# Patient Record
Sex: Male | Born: 1935
Health system: Southern US, Community
[De-identification: ages and names within clinical notes are randomized; demographics above are authoritative.]

## PROBLEM LIST (undated history)

## (undated) DIAGNOSIS — N259 Disorder resulting from impaired renal tubular function, unspecified: Secondary | ICD-10-CM

## (undated) DIAGNOSIS — C649 Malignant neoplasm of unspecified kidney, except renal pelvis: Secondary | ICD-10-CM

## (undated) DIAGNOSIS — J441 Chronic obstructive pulmonary disease with (acute) exacerbation: Secondary | ICD-10-CM

## (undated) DIAGNOSIS — K219 Gastro-esophageal reflux disease without esophagitis: Secondary | ICD-10-CM

## (undated) DIAGNOSIS — H1045 Other chronic allergic conjunctivitis: Secondary | ICD-10-CM

## (undated) DIAGNOSIS — Z8711 Personal history of peptic ulcer disease: Secondary | ICD-10-CM

## (undated) DIAGNOSIS — J309 Allergic rhinitis, unspecified: Secondary | ICD-10-CM

## (undated) DIAGNOSIS — I639 Cerebral infarction, unspecified: Secondary | ICD-10-CM

## (undated) DIAGNOSIS — J449 Chronic obstructive pulmonary disease, unspecified: Secondary | ICD-10-CM

## (undated) DIAGNOSIS — Z87442 Personal history of urinary calculi: Secondary | ICD-10-CM

## (undated) DIAGNOSIS — I10 Essential (primary) hypertension: Secondary | ICD-10-CM

## (undated) DIAGNOSIS — L989 Disorder of the skin and subcutaneous tissue, unspecified: Secondary | ICD-10-CM

## (undated) DIAGNOSIS — E669 Obesity, unspecified: Secondary | ICD-10-CM

## (undated) DIAGNOSIS — E785 Hyperlipidemia, unspecified: Secondary | ICD-10-CM

## (undated) DIAGNOSIS — M25539 Pain in unspecified wrist: Secondary | ICD-10-CM

## (undated) DIAGNOSIS — M1990 Unspecified osteoarthritis, unspecified site: Secondary | ICD-10-CM

## (undated) DIAGNOSIS — N400 Enlarged prostate without lower urinary tract symptoms: Secondary | ICD-10-CM

## (undated) DIAGNOSIS — M549 Dorsalgia, unspecified: Secondary | ICD-10-CM

## (undated) DIAGNOSIS — R42 Dizziness and giddiness: Secondary | ICD-10-CM

## (undated) DIAGNOSIS — A419 Sepsis, unspecified organism: Secondary | ICD-10-CM

## (undated) DIAGNOSIS — M722 Plantar fascial fibromatosis: Secondary | ICD-10-CM

## (undated) DIAGNOSIS — Z8679 Personal history of other diseases of the circulatory system: Secondary | ICD-10-CM

## (undated) DIAGNOSIS — R011 Cardiac murmur, unspecified: Secondary | ICD-10-CM

## (undated) DIAGNOSIS — M109 Gout, unspecified: Secondary | ICD-10-CM

## (undated) DIAGNOSIS — R079 Chest pain, unspecified: Secondary | ICD-10-CM

## (undated) DIAGNOSIS — K5900 Constipation, unspecified: Secondary | ICD-10-CM

## (undated) DIAGNOSIS — I739 Peripheral vascular disease, unspecified: Secondary | ICD-10-CM

## (undated) DIAGNOSIS — Z8719 Personal history of other diseases of the digestive system: Secondary | ICD-10-CM

## (undated) DIAGNOSIS — I350 Nonrheumatic aortic (valve) stenosis: Secondary | ICD-10-CM

## (undated) DIAGNOSIS — K279 Peptic ulcer, site unspecified, unspecified as acute or chronic, without hemorrhage or perforation: Secondary | ICD-10-CM

## (undated) HISTORY — DX: Malignant neoplasm of unspecified kidney, except renal pelvis: C64.9

## (undated) HISTORY — DX: Gastro-esophageal reflux disease without esophagitis: K21.9

## (undated) HISTORY — DX: Plantar fascial fibromatosis: M72.2

## (undated) HISTORY — DX: Dorsalgia, unspecified: M54.9

## (undated) HISTORY — DX: Dizziness and giddiness: R42

## (undated) HISTORY — PX: JOINT REPLACEMENT: SHX530

## (undated) HISTORY — DX: Pain in unspecified wrist: M25.539

## (undated) HISTORY — DX: Obesity, unspecified: E66.9

## (undated) HISTORY — DX: Hyperlipidemia, unspecified: E78.5

## (undated) HISTORY — DX: Disorder resulting from impaired renal tubular function, unspecified: N25.9

## (undated) HISTORY — DX: Peripheral vascular disease, unspecified: I73.9

## (undated) HISTORY — DX: Peptic ulcer, site unspecified, unspecified as acute or chronic, without hemorrhage or perforation: K27.9

## (undated) HISTORY — DX: Essential (primary) hypertension: I10

## (undated) HISTORY — PX: CATARACT EXTRACTION W/ INTRAOCULAR LENS  IMPLANT, BILATERAL: SHX1307

## (undated) HISTORY — DX: Nonrheumatic aortic (valve) stenosis: I35.0

## (undated) HISTORY — PX: NEPHRECTOMY: SHX65

## (undated) HISTORY — DX: Chronic obstructive pulmonary disease, unspecified: J44.9

## (undated) HISTORY — DX: Other chronic allergic conjunctivitis: H10.45

## (undated) HISTORY — DX: Gout, unspecified: M10.9

## (undated) HISTORY — PX: INNER EAR SURGERY: SHX679

## (undated) HISTORY — PX: FEMORAL-POPLITEAL BYPASS GRAFT: SHX937

## (undated) HISTORY — DX: Allergic rhinitis, unspecified: J30.9

## (undated) HISTORY — DX: Chest pain, unspecified: R07.9

## (undated) HISTORY — DX: Personal history of urinary calculi: Z87.442

## (undated) HISTORY — DX: Benign prostatic hyperplasia without lower urinary tract symptoms: N40.0

## (undated) HISTORY — DX: Constipation, unspecified: K59.00

## (undated) HISTORY — DX: Disorder of the skin and subcutaneous tissue, unspecified: L98.9

## (undated) HISTORY — DX: Chronic obstructive pulmonary disease with (acute) exacerbation: J44.1

## (undated) HISTORY — DX: Personal history of other diseases of the circulatory system: Z86.79

## (undated) HISTORY — PX: NEPHROURETERECTOMY: SUR876

---

## 1998-01-04 ENCOUNTER — Ambulatory Visit (HOSPITAL_COMMUNITY): Admission: RE | Admit: 1998-01-04 | Discharge: 1998-01-04 | Payer: Self-pay | Admitting: Cardiology

## 1998-09-25 ENCOUNTER — Encounter: Payer: Self-pay | Admitting: Vascular Surgery

## 1998-09-26 ENCOUNTER — Ambulatory Visit: Admission: RE | Admit: 1998-09-26 | Discharge: 1998-09-26 | Payer: Self-pay | Admitting: Vascular Surgery

## 1998-10-04 ENCOUNTER — Inpatient Hospital Stay: Admission: RE | Admit: 1998-10-04 | Discharge: 1998-10-07 | Payer: Self-pay | Admitting: Vascular Surgery

## 1999-04-09 ENCOUNTER — Emergency Department (HOSPITAL_COMMUNITY): Admission: EM | Admit: 1999-04-09 | Discharge: 1999-04-09 | Payer: Self-pay | Admitting: Emergency Medicine

## 1999-04-09 ENCOUNTER — Encounter: Payer: Self-pay | Admitting: Emergency Medicine

## 1999-04-23 ENCOUNTER — Ambulatory Visit (HOSPITAL_COMMUNITY): Admission: RE | Admit: 1999-04-23 | Discharge: 1999-04-23 | Payer: Self-pay | Admitting: Internal Medicine

## 1999-04-23 ENCOUNTER — Encounter: Payer: Self-pay | Admitting: Internal Medicine

## 2001-08-18 ENCOUNTER — Ambulatory Visit (HOSPITAL_COMMUNITY): Admission: RE | Admit: 2001-08-18 | Discharge: 2001-08-18 | Payer: Self-pay | Admitting: Gastroenterology

## 2006-07-07 ENCOUNTER — Ambulatory Visit (HOSPITAL_COMMUNITY): Admission: RE | Admit: 2006-07-07 | Discharge: 2006-07-07 | Payer: Self-pay | Admitting: Urology

## 2006-07-30 ENCOUNTER — Ambulatory Visit: Payer: Self-pay | Admitting: Internal Medicine

## 2006-08-20 ENCOUNTER — Ambulatory Visit: Payer: Self-pay | Admitting: Internal Medicine

## 2006-08-25 ENCOUNTER — Ambulatory Visit: Payer: Self-pay | Admitting: Gastroenterology

## 2006-09-09 ENCOUNTER — Ambulatory Visit: Payer: Self-pay | Admitting: Gastroenterology

## 2006-11-11 ENCOUNTER — Ambulatory Visit: Payer: Self-pay | Admitting: Internal Medicine

## 2007-02-11 ENCOUNTER — Ambulatory Visit: Payer: Self-pay | Admitting: Internal Medicine

## 2007-02-11 LAB — CONVERTED CEMR LAB
ALT: 15 units/L (ref 0–40)
Albumin: 3.5 g/dL (ref 3.5–5.2)
Alkaline Phosphatase: 73 units/L (ref 39–117)
CO2: 31 meq/L (ref 19–32)
Calcium: 8.9 mg/dL (ref 8.4–10.5)
Glucose, Bld: 103 mg/dL — ABNORMAL HIGH (ref 70–99)
Potassium: 3.9 meq/L (ref 3.5–5.1)
Sodium: 142 meq/L (ref 135–145)
Total Bilirubin: 0.8 mg/dL (ref 0.3–1.2)
Total CHOL/HDL Ratio: 4

## 2007-02-18 ENCOUNTER — Ambulatory Visit: Payer: Self-pay | Admitting: Internal Medicine

## 2007-05-12 DIAGNOSIS — I10 Essential (primary) hypertension: Secondary | ICD-10-CM

## 2007-05-12 DIAGNOSIS — K279 Peptic ulcer, site unspecified, unspecified as acute or chronic, without hemorrhage or perforation: Secondary | ICD-10-CM | POA: Insufficient documentation

## 2007-05-12 DIAGNOSIS — C649 Malignant neoplasm of unspecified kidney, except renal pelvis: Secondary | ICD-10-CM

## 2007-05-12 DIAGNOSIS — J449 Chronic obstructive pulmonary disease, unspecified: Secondary | ICD-10-CM | POA: Insufficient documentation

## 2007-05-12 DIAGNOSIS — N400 Enlarged prostate without lower urinary tract symptoms: Secondary | ICD-10-CM

## 2007-05-12 DIAGNOSIS — E785 Hyperlipidemia, unspecified: Secondary | ICD-10-CM

## 2007-05-12 DIAGNOSIS — I739 Peripheral vascular disease, unspecified: Secondary | ICD-10-CM

## 2007-05-12 DIAGNOSIS — J4489 Other specified chronic obstructive pulmonary disease: Secondary | ICD-10-CM

## 2007-05-12 DIAGNOSIS — Z8679 Personal history of other diseases of the circulatory system: Secondary | ICD-10-CM | POA: Insufficient documentation

## 2007-05-12 DIAGNOSIS — E669 Obesity, unspecified: Secondary | ICD-10-CM

## 2007-05-12 HISTORY — DX: Malignant neoplasm of unspecified kidney, except renal pelvis: C64.9

## 2007-05-12 HISTORY — DX: Obesity, unspecified: E66.9

## 2007-05-12 HISTORY — DX: Personal history of other diseases of the circulatory system: Z86.79

## 2007-05-12 HISTORY — DX: Other specified chronic obstructive pulmonary disease: J44.89

## 2007-05-12 HISTORY — DX: Essential (primary) hypertension: I10

## 2007-05-12 HISTORY — DX: Benign prostatic hyperplasia without lower urinary tract symptoms: N40.0

## 2007-05-12 HISTORY — DX: Hyperlipidemia, unspecified: E78.5

## 2007-05-12 HISTORY — DX: Peripheral vascular disease, unspecified: I73.9

## 2007-05-12 HISTORY — DX: Peptic ulcer, site unspecified, unspecified as acute or chronic, without hemorrhage or perforation: K27.9

## 2007-05-12 HISTORY — DX: Chronic obstructive pulmonary disease, unspecified: J44.9

## 2007-05-16 ENCOUNTER — Ambulatory Visit: Payer: Self-pay | Admitting: Internal Medicine

## 2007-05-23 ENCOUNTER — Ambulatory Visit: Payer: Self-pay | Admitting: Vascular Surgery

## 2007-06-01 HISTORY — PX: TOTAL KNEE ARTHROPLASTY: SHX125

## 2007-06-13 ENCOUNTER — Encounter: Payer: Self-pay | Admitting: Internal Medicine

## 2007-06-13 ENCOUNTER — Inpatient Hospital Stay (HOSPITAL_COMMUNITY): Admission: RE | Admit: 2007-06-13 | Discharge: 2007-06-16 | Payer: Self-pay | Admitting: Orthopedic Surgery

## 2007-07-19 ENCOUNTER — Encounter: Payer: Self-pay | Admitting: Internal Medicine

## 2007-08-10 ENCOUNTER — Ambulatory Visit: Payer: Self-pay | Admitting: Internal Medicine

## 2007-08-10 LAB — CONVERTED CEMR LAB
AST: 17 units/L (ref 0–37)
Albumin: 3.4 g/dL — ABNORMAL LOW (ref 3.5–5.2)
Alkaline Phosphatase: 90 units/L (ref 39–117)
BUN: 16 mg/dL (ref 6–23)
Basophils Absolute: 0 10*3/uL (ref 0.0–0.1)
Bilirubin Urine: NEGATIVE
Chloride: 102 meq/L (ref 96–112)
Creatinine, Ser: 1.8 mg/dL — ABNORMAL HIGH (ref 0.4–1.5)
HDL: 33.9 mg/dL — ABNORMAL LOW (ref >39.0)
Hemoglobin, Urine: NEGATIVE
Ketones, ur: NEGATIVE mg/dL
LDL Cholesterol: 71 mg/dL (ref 0–99)
MCHC: 34.7 g/dL (ref 30.0–36.0)
Monocytes Relative: 11.4 % — ABNORMAL HIGH (ref 3.0–11.0)
RBC: 4.51 M/uL (ref 4.22–5.81)
RDW: 12.7 % (ref 11.5–14.6)
TSH: 1.82 microintl units/mL (ref 0.35–5.50)
Total Bilirubin: 0.6 mg/dL (ref 0.3–1.2)
Triglycerides: 121 mg/dL (ref 0–149)
Urobilinogen, UA: 0.2 (ref 0.0–1.0)
VLDL: 24 mg/dL (ref 0–40)
pH: 7 (ref 5.0–8.0)

## 2007-08-17 ENCOUNTER — Ambulatory Visit: Payer: Self-pay | Admitting: Internal Medicine

## 2007-08-17 DIAGNOSIS — N183 Chronic kidney disease, stage 3 (moderate): Secondary | ICD-10-CM

## 2007-08-17 DIAGNOSIS — Z87442 Personal history of urinary calculi: Secondary | ICD-10-CM

## 2007-08-17 DIAGNOSIS — N259 Disorder resulting from impaired renal tubular function, unspecified: Secondary | ICD-10-CM

## 2007-08-17 DIAGNOSIS — J441 Chronic obstructive pulmonary disease with (acute) exacerbation: Secondary | ICD-10-CM

## 2007-08-17 HISTORY — DX: Personal history of urinary calculi: Z87.442

## 2007-08-17 HISTORY — DX: Chronic obstructive pulmonary disease with (acute) exacerbation: J44.1

## 2007-08-17 HISTORY — DX: Disorder resulting from impaired renal tubular function, unspecified: N25.9

## 2007-09-29 ENCOUNTER — Ambulatory Visit: Payer: Self-pay | Admitting: Internal Medicine

## 2007-09-29 DIAGNOSIS — J309 Allergic rhinitis, unspecified: Secondary | ICD-10-CM | POA: Insufficient documentation

## 2007-09-29 DIAGNOSIS — R42 Dizziness and giddiness: Secondary | ICD-10-CM

## 2007-09-29 HISTORY — DX: Allergic rhinitis, unspecified: J30.9

## 2007-09-29 HISTORY — DX: Dizziness and giddiness: R42

## 2007-10-12 ENCOUNTER — Encounter: Payer: Self-pay | Admitting: Internal Medicine

## 2007-10-18 ENCOUNTER — Encounter: Payer: Self-pay | Admitting: Internal Medicine

## 2007-10-24 ENCOUNTER — Inpatient Hospital Stay (HOSPITAL_COMMUNITY): Admission: RE | Admit: 2007-10-24 | Discharge: 2007-10-28 | Payer: Self-pay | Admitting: Orthopedic Surgery

## 2007-12-06 ENCOUNTER — Encounter: Payer: Self-pay | Admitting: Internal Medicine

## 2008-01-31 ENCOUNTER — Ambulatory Visit: Payer: Self-pay | Admitting: Internal Medicine

## 2008-01-31 DIAGNOSIS — R079 Chest pain, unspecified: Secondary | ICD-10-CM | POA: Insufficient documentation

## 2008-01-31 HISTORY — DX: Chest pain, unspecified: R07.9

## 2008-02-09 ENCOUNTER — Encounter: Payer: Self-pay | Admitting: Internal Medicine

## 2008-03-14 ENCOUNTER — Encounter: Payer: Self-pay | Admitting: Internal Medicine

## 2008-03-21 ENCOUNTER — Ambulatory Visit: Payer: Self-pay | Admitting: Internal Medicine

## 2008-03-21 LAB — CONVERTED CEMR LAB
Alkaline Phosphatase: 77 units/L (ref 39–117)
Basophils Absolute: 0 10*3/uL (ref 0.0–0.1)
Bilirubin Urine: NEGATIVE
Bilirubin, Direct: 0.1 mg/dL (ref 0.0–0.3)
Eosinophils Absolute: 0.3 10*3/uL (ref 0.0–0.7)
GFR calc Af Amer: 47 mL/min
GFR calc non Af Amer: 39 mL/min
HCT: 41.9 % (ref 39.0–52.0)
HDL: 40.5 mg/dL (ref >39.0)
Hemoglobin, Urine: NEGATIVE
MCHC: 35 g/dL (ref 30.0–36.0)
MCV: 92.8 fL (ref 78.0–100.0)
Monocytes Absolute: 1.1 10*3/uL — ABNORMAL HIGH (ref 0.1–1.0)
Monocytes Relative: 11.3 % (ref 3.0–12.0)
Nitrite: NEGATIVE
Platelets: 326 10*3/uL (ref 150–400)
Potassium: 4.3 meq/L (ref 3.5–5.1)
RDW: 12.7 % (ref 11.5–14.6)
Sodium: 139 meq/L (ref 135–145)
TSH: 1.42 microintl units/mL (ref 0.35–5.50)
Total Bilirubin: 0.8 mg/dL (ref 0.3–1.2)
Total CHOL/HDL Ratio: 3.3
Total Protein, Urine: NEGATIVE mg/dL
VLDL: 17 mg/dL (ref 0–40)

## 2008-03-22 ENCOUNTER — Encounter: Payer: Self-pay | Admitting: Internal Medicine

## 2008-03-27 ENCOUNTER — Encounter: Payer: Self-pay | Admitting: Internal Medicine

## 2008-03-28 ENCOUNTER — Ambulatory Visit: Payer: Self-pay | Admitting: Internal Medicine

## 2008-04-03 ENCOUNTER — Encounter: Payer: Self-pay | Admitting: Internal Medicine

## 2008-09-19 ENCOUNTER — Ambulatory Visit: Payer: Self-pay | Admitting: Internal Medicine

## 2008-09-19 LAB — CONVERTED CEMR LAB
ALT: 15 units/L (ref 0–53)
Albumin: 3.4 g/dL — ABNORMAL LOW (ref 3.5–5.2)
Cholesterol: 126 mg/dL (ref 0–200)
LDL Cholesterol: 72 mg/dL (ref 0–99)
Total Protein: 7 g/dL (ref 6.0–8.3)
Triglycerides: 78 mg/dL (ref 0–149)
VLDL: 16 mg/dL (ref 0–40)

## 2008-09-25 ENCOUNTER — Encounter: Payer: Self-pay | Admitting: Internal Medicine

## 2008-09-27 ENCOUNTER — Ambulatory Visit: Payer: Self-pay | Admitting: Internal Medicine

## 2008-09-27 DIAGNOSIS — M722 Plantar fascial fibromatosis: Secondary | ICD-10-CM

## 2008-09-27 HISTORY — DX: Plantar fascial fibromatosis: M72.2

## 2009-01-30 ENCOUNTER — Telehealth: Payer: Self-pay | Admitting: Internal Medicine

## 2009-01-31 ENCOUNTER — Ambulatory Visit: Payer: Self-pay | Admitting: Internal Medicine

## 2009-01-31 DIAGNOSIS — M549 Dorsalgia, unspecified: Secondary | ICD-10-CM | POA: Insufficient documentation

## 2009-01-31 HISTORY — DX: Dorsalgia, unspecified: M54.9

## 2009-02-26 ENCOUNTER — Ambulatory Visit: Payer: Self-pay | Admitting: Internal Medicine

## 2009-02-26 DIAGNOSIS — M25539 Pain in unspecified wrist: Secondary | ICD-10-CM

## 2009-02-26 HISTORY — DX: Pain in unspecified wrist: M25.539

## 2009-02-26 LAB — CONVERTED CEMR LAB
Basophils Relative: 0.5 % (ref 0.0–3.0)
Eosinophils Absolute: 0.1 10*3/uL (ref 0.0–0.7)
Eosinophils Relative: 0.7 % (ref 0.0–5.0)
Lymphocytes Relative: 25.1 % (ref 12.0–46.0)
MCHC: 33.9 g/dL (ref 30.0–36.0)
Monocytes Absolute: 0.8 10*3/uL (ref 0.1–1.0)
Neutrophils Relative %: 65.5 % (ref 43.0–77.0)
Platelets: 374 10*3/uL (ref 150.0–400.0)
RBC: 4.77 M/uL (ref 4.22–5.81)
Sed Rate: 89 mm/hr — ABNORMAL HIGH (ref 0–22)
WBC: 10.1 10*3/uL (ref 4.5–10.5)

## 2009-03-21 ENCOUNTER — Ambulatory Visit: Payer: Self-pay | Admitting: Internal Medicine

## 2009-03-21 LAB — CONVERTED CEMR LAB
AST: 15 units/L (ref 0–37)
Albumin: 3.4 g/dL — ABNORMAL LOW (ref 3.5–5.2)
Alkaline Phosphatase: 80 units/L (ref 39–117)
Calcium: 9.2 mg/dL (ref 8.4–10.5)
Cholesterol: 131 mg/dL (ref 0–200)
Eosinophils Relative: 4 % (ref 0.0–5.0)
GFR calc non Af Amer: 38.55 mL/min (ref >60)
Glucose, Bld: 102 mg/dL — ABNORMAL HIGH (ref 70–99)
HCT: 45.2 % (ref 39.0–52.0)
HDL: 38.4 mg/dL — ABNORMAL LOW (ref >39.00)
Hemoglobin, Urine: NEGATIVE
Ketones, ur: NEGATIVE mg/dL
Lymphs Abs: 3 10*3/uL (ref 0.7–4.0)
MCHC: 33.9 g/dL (ref 30.0–36.0)
MCV: 94.8 fL (ref 78.0–100.0)
Neutrophils Relative %: 55.2 % (ref 43.0–77.0)
Platelets: 274 10*3/uL (ref 150.0–400.0)
Potassium: 4.7 meq/L (ref 3.5–5.1)
RDW: 13 % (ref 11.5–14.6)
Sodium: 144 meq/L (ref 135–145)
Specific Gravity, Urine: 1.01 (ref 1.000–1.030)
Total Protein: 6.7 g/dL (ref 6.0–8.3)
Triglycerides: 101 mg/dL (ref 0.0–149.0)
Urine Glucose: NEGATIVE mg/dL
Urobilinogen, UA: 0.2 (ref 0.0–1.0)
VLDL: 20.2 mg/dL (ref 0.0–40.0)
WBC: 9.8 10*3/uL (ref 4.5–10.5)

## 2009-03-28 ENCOUNTER — Ambulatory Visit: Payer: Self-pay | Admitting: Internal Medicine

## 2009-04-08 ENCOUNTER — Telehealth: Payer: Self-pay | Admitting: Internal Medicine

## 2009-06-11 ENCOUNTER — Telehealth: Payer: Self-pay | Admitting: Internal Medicine

## 2009-09-23 ENCOUNTER — Ambulatory Visit: Payer: Self-pay | Admitting: Internal Medicine

## 2009-09-25 ENCOUNTER — Ambulatory Visit: Payer: Self-pay | Admitting: Internal Medicine

## 2009-09-27 ENCOUNTER — Encounter: Payer: Self-pay | Admitting: Internal Medicine

## 2009-09-27 ENCOUNTER — Encounter: Admission: RE | Admit: 2009-09-27 | Discharge: 2009-09-27 | Payer: Self-pay | Admitting: Internal Medicine

## 2009-10-07 ENCOUNTER — Telehealth: Payer: Self-pay | Admitting: Internal Medicine

## 2009-10-07 ENCOUNTER — Ambulatory Visit: Payer: Self-pay | Admitting: Internal Medicine

## 2009-10-24 ENCOUNTER — Telehealth (INDEPENDENT_AMBULATORY_CARE_PROVIDER_SITE_OTHER): Payer: Self-pay | Admitting: *Deleted

## 2009-11-12 ENCOUNTER — Encounter: Payer: Self-pay | Admitting: Internal Medicine

## 2010-02-19 ENCOUNTER — Ambulatory Visit: Payer: Self-pay | Admitting: Internal Medicine

## 2010-02-19 DIAGNOSIS — K5900 Constipation, unspecified: Secondary | ICD-10-CM

## 2010-02-19 HISTORY — DX: Constipation, unspecified: K59.00

## 2010-02-23 DIAGNOSIS — K219 Gastro-esophageal reflux disease without esophagitis: Secondary | ICD-10-CM

## 2010-02-23 HISTORY — DX: Gastro-esophageal reflux disease without esophagitis: K21.9

## 2010-03-18 ENCOUNTER — Ambulatory Visit: Payer: Self-pay | Admitting: Internal Medicine

## 2010-03-18 LAB — CONVERTED CEMR LAB
ALT: 15 units/L (ref 0–53)
AST: 15 units/L (ref 0–37)
Alkaline Phosphatase: 72 units/L (ref 39–117)
Basophils Absolute: 0 10*3/uL (ref 0.0–0.1)
Bilirubin, Direct: 0.2 mg/dL (ref 0.0–0.3)
CO2: 31 meq/L (ref 19–32)
Calcium: 8.8 mg/dL (ref 8.4–10.5)
Chloride: 105 meq/L (ref 96–112)
Eosinophils Relative: 4.4 % (ref 0.0–5.0)
Glucose, Bld: 91 mg/dL (ref 70–99)
HCT: 43.7 % (ref 39.0–52.0)
HDL: 38 mg/dL — ABNORMAL LOW (ref >39.00)
Ketones, ur: NEGATIVE mg/dL
LDL Cholesterol: 86 mg/dL (ref 0–99)
Lymphocytes Relative: 37.9 % (ref 12.0–46.0)
Lymphs Abs: 3.2 10*3/uL (ref 0.7–4.0)
Monocytes Relative: 10.6 % (ref 3.0–12.0)
Neutrophils Relative %: 46.7 % (ref 43.0–77.0)
Platelets: 297 10*3/uL (ref 150.0–400.0)
Sodium: 140 meq/L (ref 135–145)
Specific Gravity, Urine: 1.01 (ref 1.000–1.030)
Total Bilirubin: 0.5 mg/dL (ref 0.3–1.2)
Total CHOL/HDL Ratio: 4
Urine Glucose: NEGATIVE mg/dL
WBC: 8.4 10*3/uL (ref 4.5–10.5)
pH: 7.5 (ref 5.0–8.0)

## 2010-03-24 ENCOUNTER — Ambulatory Visit: Payer: Self-pay | Admitting: Internal Medicine

## 2010-03-24 DIAGNOSIS — H1045 Other chronic allergic conjunctivitis: Secondary | ICD-10-CM

## 2010-03-24 HISTORY — DX: Other chronic allergic conjunctivitis: H10.45

## 2010-04-14 ENCOUNTER — Telehealth: Payer: Self-pay | Admitting: Internal Medicine

## 2010-04-22 ENCOUNTER — Telehealth: Payer: Self-pay | Admitting: Internal Medicine

## 2010-06-13 ENCOUNTER — Encounter (INDEPENDENT_AMBULATORY_CARE_PROVIDER_SITE_OTHER): Payer: Self-pay | Admitting: *Deleted

## 2010-06-13 ENCOUNTER — Telehealth: Payer: Self-pay | Admitting: Internal Medicine

## 2010-07-04 ENCOUNTER — Ambulatory Visit: Payer: Self-pay | Admitting: Internal Medicine

## 2010-07-04 DIAGNOSIS — M109 Gout, unspecified: Secondary | ICD-10-CM | POA: Insufficient documentation

## 2010-07-04 HISTORY — DX: Gout, unspecified: M10.9

## 2010-07-04 LAB — CONVERTED CEMR LAB
CO2: 31 meq/L (ref 19–32)
GFR calc non Af Amer: 35.67 mL/min (ref >60)
Glucose, Bld: 93 mg/dL (ref 70–99)
Potassium: 4.5 meq/L (ref 3.5–5.1)
Sodium: 140 meq/L (ref 135–145)
Uric Acid, Serum: 9.8 mg/dL — ABNORMAL HIGH (ref 4.0–7.8)

## 2010-07-07 ENCOUNTER — Telehealth: Payer: Self-pay | Admitting: Internal Medicine

## 2010-07-07 ENCOUNTER — Encounter: Payer: Self-pay | Admitting: Internal Medicine

## 2010-09-02 ENCOUNTER — Ambulatory Visit
Admission: RE | Admit: 2010-09-02 | Discharge: 2010-09-02 | Payer: Self-pay | Source: Home / Self Care | Attending: Internal Medicine | Admitting: Internal Medicine

## 2010-10-02 NOTE — Assessment & Plan Note (Signed)
Summary: 6 MTH FU--STC   Vital Signs:  Patient profile:   77 year old male Height:      65 inches Weight:      183.75 pounds BMI:     30.69 O2 Sat:      95 % on Room air Temp:     98.2 degrees F oral Pulse rate:   65 / minute BP sitting:   148 / 72  (left arm) Cuff size:   regular  Vitals Entered By: Zella Ball Ewing CMA Duncan Dull) (March 24, 2010 11:03 AM)  O2 Flow:  Room air  CC: 6 month ROV/RE   Primary Care Provider:  Corwin Levins MD  CC:  6 month ROV/RE.  History of Present Illness: here to f/u; back pain improved with prednisone;  Pt denies CP, sob, doe, wheezing, orthopnea, pnd, worsening LE edema, palps, dizziness or syncope  Pt denies new neuro symptoms such as headache, facial or extremity weakness  C/o right and occasinally left eye matting in the AM without pain or swellking or redness;    Problems Prior to Update: 1)  Conjunctivitis, Allergic, Chronic  (ICD-372.14) 2)  Constipation  (ICD-564.00) 3)  Gerd  (ICD-530.81) 4)  Wrist Pain, Right  (ICD-719.43) 5)  Back Pain  (ICD-724.5) 6)  Plantar Fasciitis, Right  (ICD-728.71) 7)  Preventive Health Care  (ICD-V70.0) 8)  Chest Pain  (ICD-786.50) 9)  Allergic Rhinitis  (ICD-477.9) 10)  Vertigo  (ICD-780.4) 11)  Nephrolithiasis, Hx of  (ICD-V13.01) 12)  Renal Insufficiency  (ICD-588.9) 13)  Chronic Obstructive Pulmonary Disease, Acute Exacerbation  (ICD-491.21) 14)  Preventive Health Care  (ICD-V70.0) 15)  Special Screening Malignant Neoplasm of Prostate  (ICD-V76.44) 16)  Routine General Medical Exam@health  Care Facl  (ICD-V70.0) 17)  Renal Cell Cancer  (ICD-189.0) 18)  Transient Ischemic Attack, Hx of  (ICD-V12.50) 19)  COPD  (ICD-496) 20)  Obesity  (ICD-278.00) 21)  Cerebrovascular Accident, Hx of  (ICD-V12.50) 22)  Benign Prostatic Hypertrophy  (ICD-600.00) 23)  Peripheral Vascular Disease  (ICD-443.9) 24)  Peptic Ulcer Disease  (ICD-533.90) 25)  Hypertension  (ICD-401.9) 26)  Hyperlipidemia   (ICD-272.4)  Medications Prior to Update: 1)  Hydrochlorothiazide 25 Mg  Tabs (Hydrochlorothiazide) .... Take 1 Tab By Mouth Every Morning 2)  Amlodipine Besylate 5 Mg  Tabs (Amlodipine Besylate) .Marland Kitchen.. 1 By Mouth Qd 3)  Pravastatin Sodium 40 Mg  Tabs (Pravastatin Sodium) .Marland Kitchen.. 1 By Mouth Once Daily 4)  Omeprazole 20 Mg Cpdr (Omeprazole) .... By Mouth Once Daily 5)  Ecotrin 325 Mg  Tbec (Aspirin) .Marland Kitchen.. 1 By Mouth Qd 6)  Oscal 500/200 D-3 500-200 Mg-Unit  Tabs (Calcium-Vitamin D) .Marland Kitchen.. 1 By Mouth Once Daily 7)  Meclizine Hcl 12.5 Mg  Tabs (Meclizine Hcl) .Marland Kitchen.. 1 By Mouth Qid Prn 8)  Cetirizine Hcl 10 Mg  Tabs (Cetirizine Hcl) .Marland Kitchen.. 1 By Mouth Once Daily Prn 9)  Spiriva Handihaler 18 Mcg Caps (Tiotropium Bromide Monohydrate) .... Use Asd 1 Once Daily 10)  Flexeril 5 Mg Tabs (Cyclobenzaprine Hcl) .Marland Kitchen.. 1 By Mouth Three Times A Day As Needed 11)  Prednisone 10 Mg Tabs (Prednisone) .... 3po Qd For 3days, Then 2po Qd For 3days, Then 1po Qd For 3days, Then Stop 12)  Tylenol With Codeine #3 300-30 Mg Tabs (Acetaminophen-Codeine) .Marland Kitchen.. 1po Q 6 Hrs As Needed Pain 13)  Promethazine Hcl 25 Mg Tabs (Promethazine Hcl) .Marland Kitchen.. 1po Q 6 Hrs As Needed Nausea 14)  Miralax  Powd (Polyethylene Glycol 3350) .Marland KitchenMarland KitchenMarland Kitchen 17 Gm in Q [employer]  Per Day  Current Medications (verified): 1)  Hydrochlorothiazide 25 Mg  Tabs (Hydrochlorothiazide) .... Take 1 Tab By Mouth Every Morning 2)  Amlodipine Besylate 5 Mg  Tabs (Amlodipine Besylate) .Marland Kitchen.. 1 By Mouth Qd 3)  Pravastatin Sodium 40 Mg  Tabs (Pravastatin Sodium) .Marland Kitchen.. 1 By Mouth Once Daily 4)  Omeprazole 20 Mg Cpdr (Omeprazole) .... By Mouth Once Daily 5)  Ecotrin 325 Mg  Tbec (Aspirin) .Marland Kitchen.. 1 By Mouth Qd 6)  Oscal 500/200 D-3 500-200 Mg-Unit  Tabs (Calcium-Vitamin D) .Marland Kitchen.. 1 By Mouth Once Daily 7)  Meclizine Hcl 12.5 Mg  Tabs (Meclizine Hcl) .Marland Kitchen.. 1 By Mouth Qid Prn 8)  Cetirizine Hcl 10 Mg  Tabs (Cetirizine Hcl) .Marland Kitchen.. 1 By Mouth Once Daily Prn 9)  Spiriva Handihaler 18 Mcg Caps  (Tiotropium Bromide Monohydrate) .... Use Asd 1 Once Daily 10)  Flexeril 5 Mg Tabs (Cyclobenzaprine Hcl) .Marland Kitchen.. 1 By Mouth Three Times A Day As Needed 11)  Prednisone 10 Mg Tabs (Prednisone) .... 3po Qd For 3days, Then 2po Qd For 3days, Then 1po Qd For 3days, Then Stop 12)  Tylenol With Codeine #3 300-30 Mg Tabs (Acetaminophen-Codeine) .Marland Kitchen.. 1po Q 6 Hrs As Needed Pain 13)  Promethazine Hcl 25 Mg Tabs (Promethazine Hcl) .Marland Kitchen.. 1po Q 6 Hrs As Needed Nausea 14)  Miralax  Powd (Polyethylene Glycol 3350) .Marland KitchenMarland KitchenMarland Kitchen 17 Gm in Q Glass Water Per Day  Allergies (verified): 1)  ! 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  Risk Factors: Smoking Status: current (08/17/2007)  Past Medical History: Hyperlipidemia Hypertension Peptic ulcer disease Peripheral vascular disease Benign prostatic hypertrophy Cerebrovascular accident, hx of Genitourinary Tumor, hx of - renal cell cancer Obesity COPD Transient ischemic attack, hx of cancer, renal cell carcinoma Renal insufficiency - cr 1.8 Nephrolithiasis, hx of Allergic rhinitis right wrist avascular necrosis wrsit bone june 2010 - dr Melvyn Novas  - pt declined surgury GERD  Review of Systems  The patient denies anorexia, fever, weight loss, weight gain, vision loss, hoarseness, chest pain, syncope, dyspnea on exertion, peripheral edema, prolonged cough, headaches, hemoptysis, abdominal pain, melena, hematochezia,  severe indigestion/heartburn, hematuria, muscle weakness, suspicious skin lesions, transient blindness, difficulty walking, depression, unusual weight change, abnormal bleeding, enlarged lymph nodes, and angioedema.         all otherwise negative per pt -  except has ongoing severe bilat hearing loss despite hearing aids  Physical Exam  General:  alert and overweight-appearing.   Head:  normocephalic and atraumatic.   Eyes:  vision grossly intact, pupils equal, and pupils round., conjucnt with mild clearish d/c bilat    Ears:  R ear normal and L ear normal.  Nose:  no external deformity and no nasal discharge.   Mouth:  no gingival abnormalities and pharynx pink and moist.   Neck:  supple and no masses.   Lungs:  normal respiratory effort and normal breath sounds.   Heart:  normal rate and regular rhythm.   Abdomen:  soft, non-tender, and normal bowel sounds.   Msk:  no joint tenderness and  no joint swelling.   Extremities:  no edema, no erythema  Neurologic:  cranial nerves II-XII intact and strength normal in all extremities.     Impression & Recommendations:  Problem # 1:  Preventive Health Care (ICD-V70.0) Overall doing well, age appropriate education and counseling updated and referral for appropriate preventive services done unless declined, immunizations up to date or declined, diet counseling done if overweight, urged to quit smoking if smokes , most recent labs reviewed and current ordered if appropriate, ecg reviewed or declined (interpretation per ECG scanned in the EMR if done); information regarding Medicare Prevention requirements given if appropriate; speciality referrals updated as appropriate   Problem # 2:  CONJUNCTIVITIS, ALLERGIC, CHRONIC (ICD-372.14) for oTC zaditor as needed   Complete Medication List: 1)  Hydrochlorothiazide 25 Mg Tabs (Hydrochlorothiazide) .... Take 1 tab by mouth every morning 2)  Amlodipine Besylate 5 Mg Tabs (Amlodipine besylate) .Marland Kitchen.. 1 by  mouth qd 3)  Pravastatin Sodium 40 Mg Tabs (Pravastatin sodium) .Marland Kitchen.. 1 by mouth once daily 4)  Omeprazole 20 Mg Cpdr (Omeprazole) .... By mouth once daily 5)  Ecotrin 325 Mg Tbec (Aspirin) .Marland Kitchen.. 1 by mouth qd 6)  Oscal 500/200 D-3 500-200 Mg-unit Tabs (Calcium-vitamin d) .Marland Kitchen.. 1 by mouth once daily 7)  Meclizine Hcl 12.5 Mg Tabs (Meclizine hcl) .Marland Kitchen.. 1 by mouth qid prn 8)  Cetirizine Hcl 10 Mg Tabs (Cetirizine hcl) .Marland Kitchen.. 1 by mouth once daily prn 9)  Spiriva Handihaler 18 Mcg Caps (Tiotropium bromide monohydrate) .... Use asd 1 once daily 10)  Flexeril 5 Mg Tabs (Cyclobenzaprine hcl) .Marland Kitchen.. 1 by mouth three times a day as needed 11)  Prednisone 10 Mg Tabs (Prednisone) .... 3po qd for 3days, then 2po qd for 3days, then 1po qd for 3days, then stop 12)  Tylenol With Codeine #3 300-30 Mg Tabs (Acetaminophen-codeine) .Marland Kitchen.. 1po q 6 hrs as needed pain 13)  Promethazine Hcl 25 Mg Tabs (Promethazine hcl) .Marland Kitchen.. 1po q 6 hrs as needed nausea 14)  Miralax Powd (Polyethylene glycol 3350) .Marland KitchenMarland Kitchen. 17 gm in q glass water per day  Patient Instructions: 1)  please try OTC Zaditor eye drops 2)  please make sure to see your eye doctor every year -  consider [facility] 3)  Continue all previous medications as before this visit  4)  Please have the pharmacy call for refills as you need them 5)  Please schedule a follow-up appointment in 6 months or sooner if needed

## 2010-10-02 NOTE — Op Note (Signed)
Summary: [facility]  [facility]   Imported By: Sherian Rein 07/07/2010 08:50:56  _____________________________________________________________________  External Attachment:    Type:   Image     Comment:   External Document

## 2010-10-02 NOTE — Progress Notes (Signed)
  Phone Note Refill Request Message from:  Fax from Pharmacy on April 22, 2010 8:46 AM  Refills Requested: Medication #1:  PRAVASTATIN SODIUM 40 MG  TABS 1 by mouth once daily   Dosage confirmed as above?Dosage Confirmed   Last Refilled: 02/2009   Notes: [facility] Initial call taken by: Robin Ewing CMA Duncan Dull),  April 22, 2010 8:47 AM    Prescriptions: PRAVASTATIN SODIUM 40 MG  TABS (PRAVASTATIN SODIUM) 1 by mouth once daily  #90 x 3   Entered by:   Scharlene Gloss CMA (AAMA)   Authorized by:   Corwin Levins MD   Signed by:   Scharlene Gloss CMA (AAMA) on 04/22/2010   Method used:   Faxed to ...       [facility] (mail-order)       [address]       [place], [place]  [address]       Ph: [phone]       Fax: [phone]   RxID:   [id number]

## 2010-10-02 NOTE — Progress Notes (Signed)
Summary: Surgery?  Phone Note From Other Clinic   Caller: [facility] [phone] Fax Summary of Call: Surgical center called to inform MD that pt is scheduled to have surgery next week on RT wrist. RN is requesting letter from MD stating pt is okay to stop ASA prior to surgery. Initial call taken by: Margaret Pyle, CMA,  June 13, 2010 1:19 PM  Follow-up for Phone Call        ok for lettter - to robin to handle Follow-up by: Corwin Levins MD,  June 13, 2010 2:09 PM  Additional Follow-up for Phone Call Additional follow up Details #1::        Completed letter and faxed to [facility] surgical center. Additional Follow-up by: Robin Ewing CMA Duncan Dull),  June 13, 2010 2:48 PM

## 2010-10-02 NOTE — Assessment & Plan Note (Signed)
Summary: BACK PAIN/ NAUSEA /NWS   Vital Signs:  Patient profile:   77 year old male Height:      65 inches Weight:      176.50 pounds BMI:     29.48 O2 Sat:      95 % on Room air Temp:     96.9 degrees F oral Pulse rate:   70 / minute BP sitting:   126 / 82  (left arm) Cuff size:   regular  Vitals Entered By: Zella Ball Ewing CMA Duncan Dull) (February 19, 2010 10:42 AM)  O2 Flow:  Room air CC: Back pain and nausea for 5 days/RE   Primary Care Provider:  Corwin Levins MD  CC:  Back pain and nausea for 5 days/RE.  History of Present Illness: here with 5 days onset right lower bakc pain;  started one morning  - went to bed the night before and no problem, but trying to get up out of bed had onset severe pain ;  pain is persistent, had some leftover tramadol which has helped; had similar episode one time previously per pt treated here with tramadol, muscle relaxer and prednisone and resolved;  worse stil to get up from sitting but bettter that way, but still hurts much to "move in a certain way" with bending at the waist level;  has some constipation but no worse than usual,  no change in urinary symptoms; no LE pain, weak, or numb,; wife says he "staggers" but he does not think significant and no falls.  No fever, night sweats, wt loss or other constitutional symtpoms. Chart does show 4 lb wt loss.   Has also had some mild to mod nausea  intermittent daily since the day the LBP started,  vomit x 1 wihtout blood 5 days ago;  ? related to tramadol but not clear as he may have some previous.  No dysphagia, take zantac daily.  No BRBPR.  Pt denies CP, sob, doe, wheezing, orthopnea, pnd, worsening LE edema, palps, dizziness or syncope   Problems Prior to Update: 1)  Wrist Pain, Right  (ICD-719.43) 2)  Back Pain  (ICD-724.5) 3)  Plantar Fasciitis, Right  (ICD-728.71) 4)  Preventive Health Care  (ICD-V70.0) 5)  Chest Pain  (ICD-786.50) 6)  Allergic Rhinitis  (ICD-477.9) 7)  Vertigo  (ICD-780.4) 8)   Nephrolithiasis, Hx of  (ICD-V13.01) 9)  Renal Insufficiency  (ICD-588.9) 10)  Chronic Obstructive Pulmonary Disease, Acute Exacerbation  (ICD-491.21) 11)  Preventive Health Care  (ICD-V70.0) 12)  Special Screening Malignant Neoplasm of Prostate  (ICD-V76.44) 13)  Routine General Medical Exam@health  Care Facl  (ICD-V70.0) 14)  Renal Cell Cancer  (ICD-189.0) 15)  Transient Ischemic Attack, Hx of  (ICD-V12.50) 16)  COPD  (ICD-496) 17)  Obesity  (ICD-278.00) 18)  Cerebrovascular Accident, Hx of  (ICD-V12.50) 19)  Benign Prostatic Hypertrophy  (ICD-600.00) 20)  Peripheral Vascular Disease  (ICD-443.9) 21)  Peptic Ulcer Disease  (ICD-533.90) 22)  Hypertension  (ICD-401.9) 23)  Hyperlipidemia  (ICD-272.4)  Medications Prior to Update: 1)  Hydrochlorothiazide 25 Mg  Tabs (Hydrochlorothiazide) .... Take 1 Tab By Mouth Every Morning 2)  Amlodipine Besylate 5 Mg  Tabs (Amlodipine Besylate) .Marland Kitchen.. 1 By Mouth Qd 3)  Pravastatin Sodium 40 Mg  Tabs (Pravastatin Sodium) .Marland Kitchen.. 1 By Mouth Once Daily 4)  Ranitidine Hcl 150 Mg  Tabs (Ranitidine Hcl) .Marland Kitchen.. 1 By Mouth Bid 5)  Ecotrin 325 Mg  Tbec (Aspirin) .Marland Kitchen.. 1 By Mouth Qd 6)  Oscal 500/200 D-3 500-200  Mg-Unit  Tabs (Calcium-Vitamin D) .Marland Kitchen.. 1 By Mouth Once Daily 7)  Meclizine Hcl 12.5 Mg  Tabs (Meclizine Hcl) .Marland Kitchen.. 1 By Mouth Qid Prn 8)  Cetirizine Hcl 10 Mg  Tabs (Cetirizine Hcl) .Marland Kitchen.. 1 By Mouth Once Daily Prn 9)  Spiriva Handihaler 18 Mcg Caps (Tiotropium Bromide Monohydrate) .... Use Asd 1 Once Daily 10)  Tramadol Hcl 50 Mg Tabs (Tramadol Hcl) .Marland Kitchen.. 1 - 2 By Mouth Q 6 Hrs As Needed For Pain 11)  Flexeril 5 Mg Tabs (Cyclobenzaprine Hcl) .Marland Kitchen.. 1 By Mouth Three Times A Day As Needed 12)  Prednisone 10 Mg Tabs (Prednisone) .... 3po Qd For 3days, Then 2po Qd For 3days, Then 1po Qd For 3days, Then Stop  Current Medications (verified): 1)  Hydrochlorothiazide 25 Mg  Tabs (Hydrochlorothiazide) .... Take 1 Tab By Mouth Every Morning 2)  Amlodipine Besylate 5 Mg   Tabs (Amlodipine Besylate) .Marland Kitchen.. 1 By Mouth Qd 3)  Pravastatin Sodium 40 Mg  Tabs (Pravastatin Sodium) .Marland Kitchen.. 1 By Mouth Once Daily 4)  Omeprazole 20 Mg Cpdr (Omeprazole) .... By Mouth Once Daily 5)  Ecotrin 325 Mg  Tbec (Aspirin) .Marland Kitchen.. 1 By Mouth Qd 6)  Oscal 500/200 D-3 500-200 Mg-Unit  Tabs (Calcium-Vitamin D) .Marland Kitchen.. 1 By Mouth Once Daily 7)  Meclizine Hcl 12.5 Mg  Tabs (Meclizine Hcl) .Marland Kitchen.. 1 By Mouth Qid Prn 8)  Cetirizine Hcl 10 Mg  Tabs (Cetirizine Hcl) .Marland Kitchen.. 1 By Mouth Once Daily Prn 9)  Spiriva Handihaler 18 Mcg Caps (Tiotropium Bromide Monohydrate) .... Use Asd 1 Once Daily 10)  Flexeril 5 Mg Tabs (Cyclobenzaprine Hcl) .Marland Kitchen.. 1 By Mouth Three Times A Day As Needed 11)  Prednisone 10 Mg Tabs (Prednisone) .... 3po Qd For 3days, Then 2po Qd For 3days, Then 1po Qd For 3days, Then Stop 12)  Tylenol With Codeine #3 300-30 Mg Tabs (Acetaminophen-Codeine) .Marland Kitchen.. 1po Q 6 Hrs As Needed Pain 13)  Promethazine Hcl 25 Mg Tabs (Promethazine Hcl) .Marland Kitchen.. 1po Q 6 Hrs As Needed Nausea 14)  Miralax  Powd (Polyethylene Glycol 3350) .Marland KitchenMarland KitchenMarland Kitchen 17 Gm in Q Glass Water Per Day  Allergies (verified): 1)  ! 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   Head:  normocephalic and atraumatic.   Eyes:  vision grossly intact, pupils equal, and pupils round.   Ears:  R ear normal and L ear normal.   Nose:  no external deformity and no nasal discharge.   Mouth:  no gingival abnormalities and pharynx pink and moist.   Neck:  supple and no masses.   Lungs:  normal respiratory effort and normal breath sounds.   Heart:  normal rate and regular rhythm.   Abdomen:  soft, non-tender, and normal  bowel sounds.  but seem "full" and somewhat firm Msk:  spine mild tender upper lumbar levels in the midline, as well as right SI joint area;  ? mild right paraspinal spasm tender as well  Extremities:  no edema, no erythema  Neurologic:  strength normal in lower extremities and sensation intact to light touch.  ;  gait somewhat antalgic favoring the right   Impression & Recommendations:  Problem # 1:  BACK PAIN (ICD-724.5)  The following medications were removed from the medication list:    Tramadol Hcl 50 Mg Tabs (Tramadol hcl) .Marland Kitchen... 1 - 2 by mouth q 6 hrs as needed for pain His updated medication list for this problem includes:    Ecotrin 325 Mg Tbec (Aspirin) .Marland Kitchen... 1 by mouth qd    Flexeril 5 Mg Tabs (Cyclobenzaprine hcl) .Marland Kitchen... 1 by mouth three times a day as needed    Tylenol With Codeine #3 300-30 Mg Tabs (Acetaminophen-codeine) .Marland Kitchen... 1po q 6 hrs as needed pain  Orders: T-Lumbar Spine Complete, 5 Views ([id number])  Problem # 2:  GERD (ICD-530.81)  His updated medication list for this problem includes:    Omeprazole 20 Mg Cpdr (Omeprazole) ..... By mouth once daily treat as above, f/u any worsening signs or symptoms   Problem # 3:  HYPERTENSION (ICD-401.9)  His updated medication list for this problem includes:    Hydrochlorothiazide 25 Mg Tabs (Hydrochlorothiazide) .Marland Kitchen... Take 1 tab by mouth every morning     Amlodipine Besylate 5 Mg Tabs (Amlodipine besylate) .Marland Kitchen... 1 by mouth qd  BP today: 126/82 Prior BP: 122/68 (10/07/2009)  Labs Reviewed: K+: 4.7 (03/21/2009) Creat: : 1.8 (03/21/2009)   Chol: 131 (03/21/2009)   HDL: 38.40 (03/21/2009)   LDL: 72 (03/21/2009)   TG: 101.0 (03/21/2009) stable overall by hx and exam, ok to continue meds/tx as is   Problem # 4:  CONSTIPATION (ICD-564.00)  His updated medication list for this problem includes:    Miralax Powd (Polyethylene glycol 3350) .Marland KitchenMarland KitchenMarland KitchenMarland Kitchen 17 gm in q glass water per day treat as above, f/u any worsening signs or symptoms   Complete Medication List: 1)  Hydrochlorothiazide 25 Mg Tabs (Hydrochlorothiazide) .... Take 1 tab by mouth every morning 2)  Amlodipine Besylate 5 Mg Tabs (Amlodipine besylate) .Marland Kitchen.. 1 by mouth qd 3)  Pravastatin Sodium 40 Mg Tabs (Pravastatin sodium) .Marland Kitchen.. 1 by mouth once daily 4)  Omeprazole 20 Mg Cpdr (Omeprazole) .... By mouth once daily 5)  Ecotrin 325 Mg Tbec (Aspirin) .Marland Kitchen.. 1 by mouth qd 6)  Oscal 500/200 D-3 500-200 Mg-unit Tabs (Calcium-vitamin d) .Marland Kitchen.. 1 by mouth once daily 7)  Meclizine Hcl 12.5 Mg Tabs (Meclizine hcl) .Marland Kitchen.. 1 by mouth qid prn 8)  Cetirizine Hcl 10 Mg Tabs (Cetirizine hcl) .Marland Kitchen.. 1 by mouth once daily prn 9)  Spiriva Handihaler 18 Mcg Caps (Tiotropium bromide monohydrate) .... Use asd 1 once daily 10)  Flexeril 5 Mg Tabs (Cyclobenzaprine hcl) .Marland Kitchen.. 1 by mouth three times a day as needed 11)  Prednisone 10 Mg Tabs (Prednisone) .... 3po qd for 3days, then 2po qd for 3days, then 1po qd for 3days, then stop 12)  Tylenol With Codeine #3 300-30 Mg Tabs (Acetaminophen-codeine) .Marland Kitchen.. 1po q 6 hrs as needed pain 13)  Promethazine Hcl 25 Mg Tabs (Promethazine hcl) .Marland Kitchen.. 1po q 6 hrs as needed nausea 14)  Miralax Powd (Polyethylene glycol 3350) .Marland KitchenMarland Kitchen. 17 gm in q glass water per day  Patient Instructions: 1)  stop the ranitidine  2)  start  the omeprazole 20 mg per day (generic for prilosec) 3)  stop the tramadol 4)   Please take all new medications as prescribed - the tylenol with codiene for pain, muscle relaxer, and prednisone, as well as the phenergan for nausea as needed 5)  start the Miralax daily (OTC) 6)  Continue all previous medications as before this visit  7)  Please go to Radiology in the basement level for your X-Ray today  8)  Please schedule a follow-up appointment in 1 month with CPX labs as you already have planned Prescriptions: PROMETHAZINE HCL 25 MG TABS (PROMETHAZINE HCL) 1po q 6 hrs as needed nausea  #40 x 1   Entered and Authorized by:   Corwin Levins MD   Signed by:   Corwin Levins MD on 02/19/2010   Method used:   Print then Give to Patient   RxID:   [id number] OMEPRAZOLE 20 MG CPDR (OMEPRAZOLE) by mouth once daily  #30 x 11   Entered and Authorized by:   Corwin Levins MD   Signed by:   Corwin Levins MD on 02/19/2010   Method used:   Print then Give to Patient   RxID:   [id number] FLEXERIL 5 MG TABS (CYCLOBENZAPRINE HCL) 1 by mouth three times a day as needed  #60 x 1   Entered and Authorized by:   Corwin Levins MD   Signed by:   Corwin Levins MD on 02/19/2010   Method used:   Print then Give to Patient   RxID:   [id number] PREDNISONE 10 MG TABS (PREDNISONE) 3po qd for 3days, then 2po qd for 3days, then 1po qd for 3days, then stop  #18 x 0   Entered and Authorized by:   Corwin Levins MD   Signed by:   Corwin Levins MD on 02/19/2010   Method used:   Print then Give to Patient   RxID:   [id number] LOVFIEP WITH CODEINE #3 300-30 MG TABS (ACETAMINOPHEN-CODEINE) 1po q 6 hrs as needed pain  #60 x 2   Entered and Authorized by:   Corwin Levins MD   Signed by:   Corwin Levins MD on 02/19/2010   Method used:   Print then Give to Patient   RxID:   [id number]

## 2010-10-02 NOTE — Letter (Signed)
Summary: Generic Letter  [facility]  [address] [place], [place] [address]   Phone: [phone]  Fax: [phone]    06/13/2010  [name] [address] [place], [place]  [address]  To Whom it may concern,  As PCP for [name] I am aware that he is having Surgery next week on his right wrist [facility]. The patient is ok to be off ASA prior to surgery. Please call our office if you have any questions.           Sincerely,   Dr. Oliver Barre

## 2010-10-02 NOTE — Assessment & Plan Note (Signed)
Summary: post hand surgery/gout/cd   Vital Signs:  Patient profile:   77 year old male Height:      65 inches Weight:      177.38 pounds BMI:     29.62 O2 Sat:      95 % on Room air Temp:     97.7 degrees F oral Pulse rate:   73 / minute BP sitting:   128 / 80  (left arm) Cuff size:   regular  Vitals Entered By: Zella Ball Ewing CMA Duncan Dull) (July 04, 2010 9:15 AM)  O2 Flow:  Room air CC: Post Hand Surgery/RE   Primary Care Provider:  Corwin Levins MD  CC:  Post Hand Surgery/RE.  History of Present Illness: here for f/u with family ; had recent wrist surgury per Dr Letta Kocher per wife;  had evidence of gout in the joint - white powdery substance in the joint, s/p successful bone removal, right wrist now in cast for 3 more wks, and asked to f/u here for tx for gout.   No known uric acid level;  no other joint involvment or c/o pain/swelling.  Pt denies CP, worsening sob, doe, wheezing, orthopnea, pnd, worsening LE edema, palps, dizziness or syncope  Pt denies new neuro symptoms such as headache, facial or extremity weakness  No fever, wt loss, night sweats, loss of appetite or other constitutional symptoms  No recent joint trauma.  Has had some difficulty with pain med given post op - causes nausea, so avoids taking this.  Pt denies polydipsia, polyuria. Overall good compliance with meds, trying to follow low chol, diet, wt stable, little excercise however .  No recent change in breathing, cough, wheezing, or excecise intolerance.    Preventive Screening-Counseling & Management      Drug Use:  no.    Problems Prior to Update: 1)  Gout  (ICD-274.9) 2)  Conjunctivitis, Allergic, Chronic  (ICD-372.14) 3)  Constipation  (ICD-564.00) 4)  Gerd  (ICD-530.81) 5)  Wrist Pain, Right  (ICD-719.43) 6)  Back Pain  (ICD-724.5) 7)  Plantar Fasciitis, Right  (ICD-728.71) 8)  Preventive Health Care  (ICD-V70.0) 9)  Chest Pain  (ICD-786.50) 10)  Allergic Rhinitis  (ICD-477.9) 11)  Vertigo   (ICD-780.4) 12)  Nephrolithiasis, Hx of  (ICD-V13.01) 13)  Renal Insufficiency  (ICD-588.9) 14)  Chronic Obstructive Pulmonary Disease, Acute Exacerbation  (ICD-491.21) 15)  Preventive Health Care  (ICD-V70.0) 16)  Special Screening Malignant Neoplasm of Prostate  (ICD-V76.44) 17)  Routine General Medical Exam@health  Care Facl  (ICD-V70.0) 18)  Renal Cell Cancer  (ICD-189.0) 19)  Transient Ischemic Attack, Hx of  (ICD-V12.50) 20)  COPD  (ICD-496) 21)  Obesity  (ICD-278.00) 22)  Cerebrovascular Accident, Hx of  (ICD-V12.50) 23)  Benign Prostatic Hypertrophy  (ICD-600.00) 24)  Peripheral Vascular Disease  (ICD-443.9) 25)  Peptic Ulcer Disease  (ICD-533.90) 26)  Hypertension  (ICD-401.9) 27)  Hyperlipidemia  (ICD-272.4)  Medications Prior to Update: 1)  Hydrochlorothiazide 25 Mg  Tabs (Hydrochlorothiazide) .... Take 1 Tab By Mouth Every Morning 2)  Amlodipine Besylate 5 Mg  Tabs (Amlodipine Besylate) .Marland Kitchen.. 1 By Mouth Qd 3)  Pravastatin Sodium 40 Mg  Tabs (Pravastatin Sodium) .Marland Kitchen.. 1 By Mouth Once Daily 4)  Omeprazole 20 Mg Cpdr (Omeprazole) .... By Mouth Once Daily 5)  Ecotrin 325 Mg  Tbec (Aspirin) .Marland Kitchen.. 1 By Mouth Qd 6)  Oscal 500/200 D-3 500-200 Mg-Unit  Tabs (Calcium-Vitamin D) .Marland Kitchen.. 1 By Mouth Once Daily 7)  Meclizine Hcl 12.5 Mg  Tabs (Meclizine Hcl) .Marland Kitchen.. 1 By Mouth Qid Prn 8)  Cetirizine Hcl 10 Mg  Tabs (Cetirizine Hcl) .Marland Kitchen.. 1 By Mouth Once Daily Prn 9)  Spiriva Handihaler 18 Mcg Caps (Tiotropium Bromide Monohydrate) .... Use Asd 1 Once Daily 10)  Flexeril 5 Mg Tabs (Cyclobenzaprine Hcl) .Marland Kitchen.. 1 By Mouth Three Times A Day As Needed 11)  Prednisone 10 Mg Tabs (Prednisone) .... 3po Qd For 3days, Then 2po Qd For 3days, Then 1po Qd For 3days, Then Stop 12)  Tylenol With Codeine #3 300-30 Mg Tabs (Acetaminophen-Codeine) .Marland Kitchen.. 1po Q 6 Hrs As Needed Pain 13)  Promethazine Hcl 25 Mg Tabs (Promethazine Hcl) .Marland Kitchen.. 1po Q 6 Hrs As Needed Nausea 14)  Miralax  Powd (Polyethylene Glycol 3350) .Marland KitchenMarland KitchenMarland Kitchen 17  Gm in Q Glass Water Per Day  Current Medications (verified): 1)  Hydrochlorothiazide 25 Mg  Tabs (Hydrochlorothiazide) .... Take 1 Tab By Mouth Every Morning 2)  Amlodipine Besylate 5 Mg  Tabs (Amlodipine Besylate) .Marland Kitchen.. 1 By Mouth Qd 3)  Pravastatin Sodium 40 Mg  Tabs (Pravastatin Sodium) .Marland Kitchen.. 1 By Mouth Once Daily 4)  Omeprazole 20 Mg Cpdr (Omeprazole) .... By Mouth Once Daily 5)  Ecotrin 325 Mg  Tbec (Aspirin) .Marland Kitchen.. 1 By Mouth Qd 6)  Oscal 500/200 D-3 500-200 Mg-Unit  Tabs (Calcium-Vitamin D) .Marland Kitchen.. 1 By Mouth Once Daily 7)  Meclizine Hcl 12.5 Mg  Tabs (Meclizine Hcl) .Marland Kitchen.. 1 By Mouth Qid Prn 8)  Cetirizine Hcl 10 Mg  Tabs (Cetirizine Hcl) .Marland Kitchen.. 1 By Mouth Once Daily Prn 9)  Spiriva Handihaler 18 Mcg Caps (Tiotropium Bromide Monohydrate) .... Use Asd 1 Once Daily 10)  Flexeril 5 Mg Tabs (Cyclobenzaprine Hcl) .Marland Kitchen.. 1 By Mouth Three Times A Day As Needed 11)  Prednisone 10 Mg Tabs (Prednisone) .... 3po Qd For 3days, Then 2po Qd For 3days, Then 1po Qd For 3days, Then Stop 12)  Tylenol With Codeine #3 300-30 Mg Tabs (Acetaminophen-Codeine) .Marland Kitchen.. 1po Q 6 Hrs As Needed Pain 13)  Promethazine Hcl 25 Mg Tabs (Promethazine Hcl) .Marland Kitchen.. 1po Q 6 Hrs As Needed Nausea 14)  Miralax  Powd (Polyethylene Glycol 3350) .Marland KitchenMarland KitchenMarland Kitchen 17 Gm in Q Glass Water Per Day 15)  Allopurinol 100 Mg Tabs (Allopurinol) .Marland Kitchen.. 1po Once Daily 16)  Indomethacin 50 Mg Caps (Indomethacin) .Marland Kitchen.. 1po Three Times A Day As Needed Acute Gout Only  Allergies (verified): 1)  ! 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 Drug use-no  Risk Factors: Smoking Status: current (08/17/2007)  Past Medical History: Hyperlipidemia Hypertension Peptic ulcer disease Peripheral vascular disease Benign prostatic hypertrophy Cerebrovascular accident, hx of Genitourinary Tumor, hx of - renal cell cancer Obesity COPD Transient ischemic  attack, hx of cancer, renal cell carcinoma Renal insufficiency - cr 1.8 Nephrolithiasis, hx of Allergic rhinitis right wrist avascular necrosis wrsit bone june 2010 - dr Melvyn Novas  - pt declined surgury GERD Gout  Past Surgical History: Reviewed history from 08/17/2007 and no changes required. 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   Head:  normocephalic and atraumatic.   Eyes:  vision grossly intact, pupils equal, and pupils round Ears:  R ear normal and L ear normal.  Nose:  no external deformity and no nasal discharge.   Mouth:  no gingival abnormalities and pharynx pink and moist.   Neck:  supple and no masses.   Lungs:  normal respiratory effort and normal breath sounds.   Heart:  normal rate and regular rhythm.   Msk:  right wrist in cast, unable to examine Extremities:  no edema, no erythema    Impression & Recommendations:  Problem # 1:  GOUT (ICD-274.9)  with recent right wrist arthropathy, also s/p surgury  His updated medication list for this problem includes:    Allopurinol 100 Mg Tabs (Allopurinol) .Marland Kitchen... 1po once daily  Orders: TLB-Uric Acid, Blood (84550-URIC) treat as above, f/u any worsening signs or symptoms , check uric acid level baseline, and f/u next visit repeat uric acid  Problem # 2:  RENAL INSUFFICIENCY (ICD-588.9) stable overall by hx and exam, ok to continue meds/tx as is , for renal fxn re-check today with starting the allopurinol as above Orders: TLB-BMP (Basic Metabolic Panel-BMET) (80048-METABOL)  Problem # 3:  HYPERTENSION (ICD-401.9)  His updated medication list for this problem  includes:    Hydrochlorothiazide 25 Mg Tabs (Hydrochlorothiazide) .Marland Kitchen... Take 1 tab by mouth every morning    Amlodipine Besylate 5 Mg Tabs (Amlodipine besylate) .Marland Kitchen... 1 by mouth qd  BP today: 128/80 Prior BP: 148/72 (03/24/2010)  Labs Reviewed: K+: 4.4 (03/18/2010) Creat: : 1.9 (03/18/2010)   Chol: 139 (03/18/2010)   HDL: 38.00 (03/18/2010)   LDL: 86 (03/18/2010)   TG: 77.0 (03/18/2010) stable overall by hx and exam, ok to continue meds/tx as is   Problem # 4:  COPD (ICD-496)  His updated medication list for this problem includes:    Spiriva Handihaler 18 Mcg Caps (Tiotropium bromide monohydrate) ..... Use asd 1 once daily stable overall by hx and exam, ok to continue meds/tx as is   Complete Medication List: 1)  Hydrochlorothiazide 25 Mg Tabs (Hydrochlorothiazide) .... Take 1 tab by mouth every morning 2)  Amlodipine Besylate 5 Mg Tabs (Amlodipine besylate) .Marland Kitchen.. 1 by mouth qd 3)  Pravastatin Sodium 40 Mg Tabs (Pravastatin sodium) .Marland Kitchen.. 1 by mouth once daily 4)  Omeprazole 20 Mg Cpdr (Omeprazole) .... By mouth once daily 5)  Ecotrin 325 Mg Tbec (Aspirin) .Marland Kitchen.. 1 by mouth qd 6)  Oscal 500/200 D-3 500-200 Mg-unit Tabs (Calcium-vitamin d) .Marland Kitchen.. 1 by mouth once daily 7)  Meclizine Hcl 12.5 Mg Tabs (Meclizine hcl) .Marland Kitchen.. 1 by mouth qid prn 8)  Cetirizine Hcl 10 Mg Tabs (Cetirizine hcl) .Marland Kitchen.. 1 by mouth once daily prn 9)  Spiriva Handihaler 18 Mcg Caps (Tiotropium bromide monohydrate) .... Use asd 1 once daily 10)  Flexeril 5 Mg Tabs (Cyclobenzaprine hcl) .Marland Kitchen.. 1 by mouth three times a day as needed 11)  Prednisone 10 Mg Tabs (Prednisone) .... 3po qd for 3days, then 2po qd for 3days, then 1po qd for 3days, then stop 12)  Tylenol With Codeine #3 300-30 Mg Tabs (Acetaminophen-codeine) .Marland Kitchen.. 1po q 6 hrs as needed pain 13)  Promethazine Hcl 25 Mg Tabs (Promethazine hcl) .Marland Kitchen.. 1po q 6 hrs as needed nausea 14)  Miralax Powd (Polyethylene glycol 3350) .Marland KitchenMarland Kitchen. 17 gm in q glass water per day 15)  Allopurinol  100 Mg Tabs (Allopurinol) .Marland Kitchen.. 1po once daily 16)  Indomethacin 50 Mg Caps (Indomethacin) .Marland Kitchen.. 1po three times a day as needed acute gout only  Patient Instructions: 1)  Please take all new medications as prescribed  - remember the allopurinol is daily medication, and the indomethacin is only to be used for an acute attack for ONE day only (if the attack persists after one day, you should be seen in the office) 2)  Continue all previous medications as before this visit 3)  Please go to the Lab in the basement for your blood and/or urine tests today  - the uric acid  and kidney function labs 4)  please return for LAB only in 2 months: 5)  uric acid:  274.9 6)  Please call the number on the [facility] for results of your testing  7)  Please schedule a follow-up appointment in 4 months (you may cancel any other appts in the meantime) Prescriptions: INDOMETHACIN 50 MG CAPS (INDOMETHACIN) 1po three times a day as needed acute gout only  #60 x 1   Entered and Authorized by:   Corwin Levins MD   Signed by:   Corwin Levins MD on 07/04/2010   Method used:   Print then Give to Patient   RxID:   [id number] ALLOPURINOL 100 MG TABS (ALLOPURINOL) 1po once daily  #90 x 3   Entered and Authorized by:   Corwin Levins MD   Signed by:   Corwin Levins MD on 07/04/2010   Method used:   Print then Give to Patient   RxID:   [id number]    Orders Added: 1)  TLB-Uric Acid, Blood [84550-URIC] 2)  TLB-BMP (Basic Metabolic Panel-BMET) [80048-METABOL] 3)  Est. Patient Level IV [95284]

## 2010-10-02 NOTE — Miscellaneous (Signed)
Summary: Orders Update pft charges  Clinical Lists Changes  Orders: Added new Service order of Carbon Monoxide diffusing w/capacity (94720) - Signed Added new Service order of Lung Volumes (94240) - Signed Added new Service order of Spirometry (Pre & Post) (94060) - Signed 

## 2010-10-02 NOTE — Progress Notes (Signed)
Summary: rx request  Phone Note Call from Patient Call back at Home Phone [phone]   Caller: spouse--Nancy Summary of Call: Harriett Sine called stating that the patient hit his foot on a chair and it is swollen, inflamed, and very painful. This is the 2nd day that the patient cannot put his weight on the foot and she cannot bring him to the office. She would like to know if something can bee called in for pain/inflammation b/c ice packs and epsom salts baths have not helped. Please advise. Initial call taken by: Lucious Groves,  October 24, 2009 9:20 AM  Follow-up for Phone Call        sound like he may have fracture of the foot where the best treatment may be more than just pain meds;  the best course is to go to ER now;  if unable to ambulate family should call 911 for transport Follow-up by: Corwin Levins MD,  October 24, 2009 1:06 PM  Additional Follow-up for Phone Call Additional follow up Details #1::        I informed pts spouse. She stated she doesn't think pt will go but she will see what she can do. Additional Follow-up by: Josph Macho RMA,  October 24, 2009 2:35 PM

## 2010-10-02 NOTE — Assessment & Plan Note (Signed)
Summary: 6 mos f/u $50 / cd   Vital Signs:  Patient profile:   77 year old male Height:      66 inches Weight:      181 pounds BMI:     29.32 O2 Sat:      95 % on Room air Temp:     96.8 degrees F oral Pulse rate:   57 / minute BP sitting:   144 / 88  (left arm) Cuff size:   regular  Vitals Entered ByZella Ball Ewing (September 23, 2009 9:04 AM)  O2 Flow:  Room air CC: 6 Mo ROV/RE   Primary Care Provider:  Corwin Levins MD  CC:  6 Mo ROV/RE.  History of Present Illness: c/o some mild tingling and numbness;  c/o lack of energy but Pt denies CP, worsening sob, doe, wheezing, orthopnea, pnd, worsening LE edema, palps, dizziness or syncope . Pt denies other new neuro symptoms such as headache, facial or extremity weakness Denies depressive symptoms.  as stable  pulm symtpoms with occasional cough, non prod , without wheezing and falls.    Problems Prior to Update: 1)  Wrist Pain, Right  (ICD-719.43) 2)  Back Pain  (ICD-724.5) 3)  Plantar Fasciitis, Right  (ICD-728.71) 4)  Preventive Health Care  (ICD-V70.0) 5)  Chest Pain  (ICD-786.50) 6)  Allergic Rhinitis  (ICD-477.9) 7)  Vertigo  (ICD-780.4) 8)  Nephrolithiasis, Hx of  (ICD-V13.01) 9)  Renal Insufficiency  (ICD-588.9) 10)  Chronic Obstructive Pulmonary Disease, Acute Exacerbation  (ICD-491.21) 11)  Preventive Health Care  (ICD-V70.0) 12)  Special Screening Malignant Neoplasm of Prostate  (ICD-V76.44) 13)  Routine General Medical Exam@health  Care Facl  (ICD-V70.0) 14)  Renal Cell Cancer  (ICD-189.0) 15)  Transient Ischemic Attack, Hx of  (ICD-V12.50) 16)  COPD  (ICD-496) 17)  Obesity  (ICD-278.00) 18)  Cerebrovascular Accident, Hx of  (ICD-V12.50) 19)  Benign Prostatic Hypertrophy  (ICD-600.00) 20)  Peripheral Vascular Disease  (ICD-443.9) 21)  Peptic Ulcer Disease  (ICD-533.90) 22)  Hypertension  (ICD-401.9) 23)  Hyperlipidemia  (ICD-272.4)  Medications Prior to Update: 1)  Hydrochlorothiazide 25 Mg  Tabs  (Hydrochlorothiazide) .... Take 1 Tab By Mouth Every Morning 2)  Amlodipine Besylate 5 Mg  Tabs (Amlodipine Besylate) .Marland Kitchen.. 1 By Mouth Qd 3)  Pravastatin Sodium 40 Mg  Tabs (Pravastatin Sodium) .Marland Kitchen.. 1 By Mouth Once Daily 4)  Ranitidine Hcl 150 Mg  Tabs (Ranitidine Hcl) .Marland Kitchen.. 1 By Mouth Bid 5)  Ecotrin 325 Mg  Tbec (Aspirin) .Marland Kitchen.. 1 By Mouth Qd 6)  Oscal 500/200 D-3 500-200 Mg-Unit  Tabs (Calcium-Vitamin D) .Marland Kitchen.. 1 By Mouth Once Daily 7)  Meclizine Hcl 12.5 Mg  Tabs (Meclizine Hcl) .Marland Kitchen.. 1 By Mouth Qid Prn 8)  Cetirizine Hcl 10 Mg  Tabs (Cetirizine Hcl) .Marland Kitchen.. 1 By Mouth Once Daily Prn 9)  Oxycodone Hcl 5 Mg Tabs (Oxycodone Hcl) .Marland Kitchen.. 1- 2 By Mouth Q 6 Hrs As Needed 10)  Meloxicam 7.5 Mg Tabs (Meloxicam) .Marland Kitchen.. 1 By Mouth Once Daily 11)  Spiriva Handihaler 18 Mcg Caps (Tiotropium Bromide Monohydrate) .... Use Asd 1 Once Daily  Current Medications (verified): 1)  Hydrochlorothiazide 25 Mg  Tabs (Hydrochlorothiazide) .... Take 1 Tab By Mouth Every Morning 2)  Amlodipine Besylate 5 Mg  Tabs (Amlodipine Besylate) .Marland Kitchen.. 1 By Mouth Qd 3)  Pravastatin Sodium 40 Mg  Tabs (Pravastatin Sodium) .Marland Kitchen.. 1 By Mouth Once Daily 4)  Ranitidine Hcl 150 Mg  Tabs (Ranitidine Hcl) .Marland Kitchen.. 1 By Mouth Bid 5)  Ecotrin 325 Mg  Tbec (Aspirin) .Marland Kitchen.. 1 By Mouth Qd 6)  Oscal 500/200 D-3 500-200 Mg-Unit  Tabs (Calcium-Vitamin D) .Marland Kitchen.. 1 By Mouth Once Daily 7)  Meclizine Hcl 12.5 Mg  Tabs (Meclizine Hcl) .Marland Kitchen.. 1 By Mouth Qid Prn 8)  Cetirizine Hcl 10 Mg  Tabs (Cetirizine Hcl) .Marland Kitchen.. 1 By Mouth Once Daily Prn 9)  Spiriva Handihaler 18 Mcg Caps (Tiotropium Bromide Monohydrate) .... Use Asd 1 Once Daily  Allergies (verified): 1)  ! Sulfa  Past History:  Past Medical History: Last updated: 03/28/2009 Hyperlipidemia Hypertension Peptic ulcer disease Peripheral vascular disease Benign prostatic hypertrophy Cerebrovascular accident, hx of Genitourinary Tumor, hx of - renal cell cancer Obesity COPD Transient ischemic attack, hx  of cancer, renal cell carcinoma Renal insufficiency - cr 1.8 Nephrolithiasis, hx of Allergic rhinitis right wrist avascular necrosis wrsit bone june 2010 - dr Melvyn Novas   Past Surgical History: Last updated: 08/17/2007 Cataract extraction Ear Surgery Nephroureterectomy Nephrectomy s/p left knee replacement 10/08 s/p bilat fem-pop bypass  Social History: Last updated: 03/28/2008 Current Smoker Alcohol use-no married > 60 yrs 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  , wears hearing aid on the right, completely dear to left ear Head:  normocephalic and atraumatic.   Eyes:  vision grossly intact, pupils equal, and pupils round.   Ears:  R ear normal and L ear normal.   Nose:  no external deformity and no nasal discharge.   Mouth:  no gingival abnormalities and pharynx pink and moist.   Neck:  supple and no masses.   Lungs:  normal respiratory effort, R decreased breath sounds, and L decreased breath sounds.   Heart:  normal rate, regular rhythm, and no murmur.   Msk:  no joint tenderness and no joint swelling.   Extremities:  no edema, no erythema  Neurologic:  cranial nerves II-XII intact.  except deaf left ear   Impression & Recommendations:  Problem # 1:  COPD (ICD-496)  His updated medication list for this problem includes:    Spiriva Handihaler 18 Mcg Caps (Tiotropium bromide monohydrate) ..... Use asd 1 once daily overall stable, but will check PFT's to measure as he has not done this in the past  Orders: Misc. Referral (Misc. Ref)  Problem # 2:  HYPERTENSION (ICD-401.9)  His updated medication list for this problem includes:    Hydrochlorothiazide 25 Mg Tabs (Hydrochlorothiazide) .Marland Kitchen... Take 1 tab by mouth every morning    Amlodipine Besylate 5 Mg Tabs (Amlodipine besylate) .Marland Kitchen...  1 by mouth qd with smoking hx - to check for AAA  Orders: Misc. Referral (Misc. Ref)  BP today: 144/88 Prior BP: 140/74 (03/28/2009)  Labs Reviewed: K+: 4.7 (03/21/2009) Creat: : 1.8 (03/21/2009)   Chol: 131 (03/21/2009)   HDL: 38.40 (03/21/2009)   LDL: 72 (03/21/2009)   TG: 101.0 (03/21/2009)  Problem # 3:  HYPERLIPIDEMIA (ICD-272.4)  His updated medication list for this problem includes:    Pravastatin Sodium 40 Mg Tabs (Pravastatin sodium) .Marland Kitchen... 1 by mouth once daily  Labs Reviewed: SGOT: 15 (03/21/2009)   SGPT: 14 (03/21/2009)   HDL:38.40 (03/21/2009), 38.9 (09/19/2008)  LDL:72 (03/21/2009), 72 (09/19/2008)  Chol:131 (03/21/2009), 126 (09/19/2008)  Trig:101.0 (03/21/2009), 78 (09/19/2008) stable overall by hx and exam, ok to continue meds/tx as is , Pt to continue diet  efforts, good med tolerance;  - goal LDL less than 70  Complete Medication List: 1)  Hydrochlorothiazide 25 Mg Tabs (Hydrochlorothiazide) .... Take 1 tab by mouth every morning 2)  Amlodipine Besylate 5 Mg Tabs (Amlodipine besylate) .Marland Kitchen.. 1 by mouth qd 3)  Pravastatin Sodium 40 Mg Tabs (Pravastatin sodium) .Marland Kitchen.. 1 by mouth once daily 4)  Ranitidine Hcl 150 Mg Tabs (Ranitidine hcl) .Marland Kitchen.. 1 by mouth bid 5)  Ecotrin 325 Mg Tbec (Aspirin) .Marland Kitchen.. 1 by mouth qd 6)  Oscal 500/200 D-3 500-200 Mg-unit Tabs (Calcium-vitamin d) .Marland Kitchen.. 1 by mouth once daily 7)  Meclizine Hcl 12.5 Mg Tabs (Meclizine hcl) .Marland Kitchen.. 1 by mouth qid prn 8)  Cetirizine Hcl 10 Mg Tabs (Cetirizine hcl) .Marland Kitchen.. 1 by mouth once daily prn 9)  Spiriva Handihaler 18 Mcg Caps (Tiotropium bromide monohydrate) .... Use asd 1 once daily  Other Orders: Flu Vaccine 72yrs + (21308) Administration Flu vaccine - MCR (M5784)  Patient Instructions: 1)  You will be contacted about the referral(s) to: Pulmonary Function Tests, and the aortic ultrasound 2)  you had the flu shot today 3)  Continue all previous medications as before this visit  4)  Please schedule a follow-up  appointment in 6 months with CPX labs   Flu Vaccine Consent Questions     Do you have a history of severe allergic reactions to this vaccine? no    Any prior history of allergic reactions to egg and/or gelatin? no    Do you have a sensitivity to the preservative Thimersol? no    Do you have a past history of Guillan-Barre Syndrome? no    Do you currently have an acute febrile illness? no    Have you ever had a severe reaction to latex? no    Vaccine information given and explained to patient? yes    Are you currently pregnant? no    Lot Number:[id number]   Exp Date:02/27/2010   Site Given  Left Deltoid IMdflu

## 2010-10-02 NOTE — Progress Notes (Signed)
  Phone Note Refill Request  on October 07, 2009 5:01 PM  Refills Requested: Medication #1:  MECLIZINE HCL 12.5 MG  TABS 1 by mouth qid prn   Dosage confirmed as above?Dosage Confirmed   Notes: [facility] Initial call taken by: Scharlene Gloss,  October 07, 2009 5:02 PM    Prescriptions: MECLIZINE HCL 12.5 MG  TABS (MECLIZINE HCL) 1 by mouth qid prn  #60 x 5   Entered by:   Scharlene Gloss   Authorized by:   Corwin Levins MD   Signed by:   Scharlene Gloss on 10/07/2009   Method used:   [facility]axed to ...       [facility] [address] [id number][facility] (retail)       [address]       [place], [place]  [address]       Ph: [phone] or [phone]       Fax: [phone]   RxID:   [id number]

## 2010-10-02 NOTE — Progress Notes (Signed)
  Phone Note Refill Request Message from:  Fax from Pharmacy on April 14, 2010 11:33 AM  Refills Requested: Medication #1:  AMLODIPINE BESYLATE 5 MG  TABS 1 by mouth qd   Dosage confirmed as above?Dosage Confirmed   Last Refilled: 03/2009   Notes: [facility] Initial call taken by: Robin Ewing CMA (AAMA),  April 14, 2010 11:33 AM    Prescriptions: AMLODIPINE BESYLATE 5 MG  TABS (AMLODIPINE BESYLATE) 1 by mouth qd  #90 x 3   Entered by:   Scharlene Gloss CMA (AAMA)   Authorized by:   Corwin Levins MD   Signed by:   Scharlene Gloss CMA (AAMA) on 04/14/2010   Method used:   Faxed to ...       [facility] (mail-order)       [address]       [place], [place]  [address]       Ph: [phone]       Fax: [phone]   RxID:   [id number]

## 2010-10-02 NOTE — Assessment & Plan Note (Signed)
Summary: left side back pain/cd   Vital Signs:  Patient profile:   77 year old male Height:      65 inches Weight:      181 pounds BMI:     30.23 O2 Sat:      95 % on Room air Temp:     97.3 degrees F oral Pulse rate:   63 / minute BP sitting:   122 / 68  (left arm) Cuff size:   regular  Vitals Entered ByZella Ball Ewing (October 07, 2009 4:17 PM)  O2 Flow:  Room air CC: Back pain/RE   Primary Care Provider:  Corwin Levins MD  CC:  Back pain/RE.  History of Present Illness: here with 2 days onset moderate to severe pain to left lower back , worse to stand up from chair or lie down (but ok to lie flat) and twist about;  no fever, wt loss, night sweats, bowel or bladder change, or LE pain, weak, or numbness.  No falls or trauma, not sure what made it seem to start.  Overall good compliance with meds,    Problems Prior to Update: 1)  Wrist Pain, Right  (ICD-719.43) 2)  Back Pain  (ICD-724.5) 3)  Plantar Fasciitis, Right  (ICD-728.71) 4)  Preventive Health Care  (ICD-V70.0) 5)  Chest Pain  (ICD-786.50) 6)  Allergic Rhinitis  (ICD-477.9) 7)  Vertigo  (ICD-780.4) 8)  Nephrolithiasis, Hx of  (ICD-V13.01) 9)  Renal Insufficiency  (ICD-588.9) 10)  Chronic Obstructive Pulmonary Disease, Acute Exacerbation  (ICD-491.21) 11)  Preventive Health Care  (ICD-V70.0) 12)  Special Screening Malignant Neoplasm of Prostate  (ICD-V76.44) 13)  Routine General Medical Exam@health  Care Fac[facility]  (ICD-V70.0) 14)  Renal Cell Cancer  (ICD-189.0) 15)  Transient Ischemic Attack, Hx of  (ICD-V12.50) 16)  COPD  (ICD-496) 17)  Obesity  (ICD-278.00) 18)  Cerebrovascular Accident, Hx of  (ICD-V12.50) 19)  Benign Prostatic Hypertrophy  (ICD-600.00) 20)  Peripheral Vascular Disease  (ICD-443.9) 21)  Peptic Ulcer Disease  (ICD-533.90) 22)  Hypertension  (ICD-401.9) 23)  Hyperlipidemia  (ICD-272.4)  Medications Prior to Update: 1)  Hydrochlorothiazide 25 Mg  Tabs (Hydrochlorothiazide) .... Take 1 Tab By  Mouth Every Morning 2)  Amlodipine Besylate 5 Mg  Tabs (Amlodipine Besylate) .Marland Kitchen.. 1 By Mouth Qd 3)  Pravastatin Sodium 40 Mg  Tabs (Pravastatin Sodium) .Marland Kitchen.. 1 By Mouth Once Daily 4)  Ranitidine Hcl 150 Mg  Tabs (Ranitidine Hcl) .Marland Kitchen.. 1 By Mouth Bid 5)  Ecotrin 325 Mg  Tbec (Aspirin) .Marland Kitchen.. 1 By Mouth Qd 6)  Oscal 500/200 D-3 500-200 Mg-Unit  Tabs (Calcium-Vitamin D) .Marland Kitchen.. 1 By Mouth Once Daily 7)  Meclizine Hcl 12.5 Mg  Tabs (Meclizine Hcl) .Marland Kitchen.. 1 By Mouth Qid Prn 8)  Cetirizine Hcl 10 Mg  Tabs (Cetirizine Hcl) .Marland Kitchen.. 1 By Mouth Once Daily Prn 9)  Spiriva Handihaler 18 Mcg Caps (Tiotropium Bromide Monohydrate) .... Use Asd 1 Once Daily  Current Medications (verified): 1)  Hydrochlorothiazide 25 Mg  Tabs (Hydrochlorothiazide) .... Take 1 Tab By Mouth Every Morning 2)  Amlodipine Besylate 5 Mg  Tabs (Amlodipine Besylate) .Marland Kitchen.. 1 By Mouth Qd 3)  Pravastatin Sodium 40 Mg  Tabs (Pravastatin Sodium) .Marland Kitchen.. 1 By Mouth Once Daily 4)  Ranitidine Hcl 150 Mg  Tabs (Ranitidine Hcl) .Marland Kitchen.. 1 By Mouth Bid 5)  Ecotrin 325 Mg  Tbec (Aspirin) .Marland Kitchen.. 1 By Mouth Qd 6)  Oscal 500/200 D-3 500-200 Mg-Unit  Tabs (Calcium-Vitamin D) .Marland Kitchen.. 1 By Mouth Once Daily  7)  Meclizine Hcl 12.5 Mg  Tabs (Meclizine Hcl) .Marland Kitchen.. 1 By Mouth Qid Prn 8)  Cetirizine Hcl 10 Mg  Tabs (Cetirizine Hcl) .Marland Kitchen.. 1 By Mouth Once Daily Prn 9)  Spiriva Handihaler 18 Mcg Caps (Tiotropium Bromide Monohydrate) .... Use Asd 1 Once Daily 10)  Tramadol Hcl 50 Mg Tabs (Tramadol Hcl) .Marland Kitchen.. 1 - 2 By Mouth Q 6 Hrs As Needed For Pain 11)  Flexeril 5 Mg Tabs (Cyclobenzaprine Hcl) .Marland Kitchen.. 1 By Mouth Three Times A Day As Needed 12)  Prednisone 10 Mg Tabs (Prednisone) .... 3po Qd For 3days, Then 2po Qd For 3days, Then 1po Qd For 3days, Then Stop  Allergies (verified): 1)  ! Sulfa  Past History:  Past Medical History: Last updated: 03/28/2009 Hyperlipidemia Hypertension Peptic ulcer disease Peripheral vascular disease Benign prostatic hypertrophy Cerebrovascular  accident, hx of Genitourinary Tumor, hx of - renal cell cancer Obesity COPD Transient ischemic attack, hx of cancer, renal cell carcinoma Renal insufficiency - cr 1.8 Nephrolithiasis, hx of Allergic rhinitis right wrist avascular necrosis wrsit bone june 2010 - dr Melvyn Novas   Past Surgical History: Last updated: 08/17/2007 Cataract extraction Ear Surgery Nephroureterectomy Nephrectomy s/p left knee replacement 10/08 s/p bilat fem-pop bypass  Social History: Last updated: 03/28/2008 Current Smoker Alcohol use-no married > 60 yrs 5th grade education 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   Head:  normocephalic and atraumatic.   Eyes:  vision grossly intact, pupils equal, and pupils round.   Ears:  R ear normal and L ear normal.   Nose:  no external deformity and no nasal discharge.   Mouth:  no gingival abnormalities and pharynx pink and moist.   Neck:  supple and no masses.   Lungs:  normal respiratory effort and normal breath sounds.   Heart:  normal rate and regular rhythm.   Extremities:  no edema, no erythema    Impression & Recommendations:  Problem # 1:  BACK PAIN (ICD-724.5)  His updated medication list for this problem includes:    Ecotrin 325 Mg Tbec (Aspirin) .Marland Kitchen... 1 by mouth qd    Tramadol Hcl 50 Mg Tabs (Tramadol hcl) .Marland Kitchen... 1 - 2 by mouth q 6 hrs as needed for pain    Flexeril 5 Mg Tabs (Cyclobenzaprine hcl) .Marland Kitchen... 1 by mouth three times a day as needed appears c/w msk strain, left side, exam benign o/w - treat as above, f/u any worsening signs or symptoms   Problem # 2:  HYPERTENSION (ICD-401.9)  His updated medication list for this problem includes:    Hydrochlorothiazide 25 Mg Tabs (Hydrochlorothiazide) .Marland Kitchen... Take 1 tab by mouth every morning    Amlodipine Besylate 5 Mg Tabs (Amlodipine  besylate) .Marland Kitchen... 1 by mouth qd  BP today: 122/68 Prior BP: 144/88 (09/23/2009)  Labs Reviewed: K+: 4.7 (03/21/2009) Creat: : 1.8 (03/21/2009)   Chol: 131 (03/21/2009)   HDL: 38.40 (03/21/2009)   LDL: 72 (03/21/2009)   TG: 101.0 (03/21/2009) stable overall by hx and exam, ok to continue meds/tx as is   Problem # 3:  COPD (ICD-496)  His updated medication list for this problem includes:    Spiriva Handihaler 18 Mcg Caps (Tiotropium bromide monohydrate) ..... Use asd 1 once daily stable overall by hx and exam, ok to continue meds/tx as is , recent pft;s d/w pt  Complete Medication List: 1)  Hydrochlorothiazide 25 Mg Tabs (Hydrochlorothiazide) .... Take 1 tab by mouth every morning 2)  Amlodipine Besylate 5 Mg Tabs (Amlodipine besylate) .Marland Kitchen.. 1 by mouth qd 3)  Pravastatin Sodium 40 Mg Tabs (Pravastatin sodium) .Marland Kitchen.. 1 by mouth once daily 4)  Ranitidine Hcl 150 Mg Tabs (Ranitidine hcl) .Marland Kitchen.. 1 by mouth bid 5)  Ecotrin 325 Mg Tbec (Aspirin) .Marland Kitchen.. 1 by mouth qd 6)  Oscal 500/200 D-3 500-200 Mg-unit Tabs (Calcium-vitamin d) .Marland Kitchen.. 1 by mouth once daily 7)  Meclizine Hcl 12.5 Mg Tabs (Meclizine hcl) .Marland Kitchen.. 1 by mouth qid prn 8)  Cetirizine Hcl 10 Mg Tabs (Cetirizine hcl) .Marland Kitchen.. 1 by mouth once daily prn 9)  Spiriva Handihaler 18 Mcg Caps (Tiotropium bromide monohydrate) .... Use asd 1 once daily 10)  Tramadol Hcl 50 Mg Tabs (Tramadol hcl) .Marland Kitchen.. 1 - 2 by mouth q 6 hrs as needed for pain 11)  Flexeril 5 Mg Tabs (Cyclobenzaprine hcl) .Marland Kitchen.. 1 by mouth three times a day as needed 12)  Prednisone 10 Mg Tabs (Prednisone) .... 3po qd for 3days, then 2po qd for 3days, then 1po qd for 3days, then stop  Patient Instructions: 1)  Please take all new medications as prescribed 2)  Continue all previous medications as before this visit  3)  Please schedule a follow-up appointment in 5 months with CPX labs or sooner if needed Prescriptions: PREDNISONE 10 MG TABS (PREDNISONE) 3po qd for 3days, then 2po qd for 3days,  then 1po qd for 3days, then stop  #18 x 0   Entered and Authorized by:   Corwin Levins MD   Signed by:   Corwin Levins MD on 10/07/2009   Method used:   Print then Give to Patient   RxID:   [id number] FLEXERIL 5 MG TABS (CYCLOBENZAPRINE HCL) 1 by mouth three times a day as needed  #60 x 1   Entered and Authorized by:   Corwin Levins MD   Signed by:   Corwin Levins MD on 10/07/2009   Method used:   Print then Give to Patient   RxID:   [id number] TRAMADOL HCL 50 MG TABS (TRAMADOL HCL) 1 - 2 by mouth q 6 hrs as needed for pain  #60 x 1   Entered and Authorized by:   Corwin Levins MD   Signed by:   Corwin Levins MD on 10/07/2009   Method used:   Print then Give to Patient   RxID:   [id number]

## 2010-10-02 NOTE — Letter (Signed)
[facility]   Imported By: Sherian Rein 11/18/2009 08:11:29  _____________________________________________________________________  External Attachment:    Type:   Image     Comment:   External Document

## 2010-10-02 NOTE — Progress Notes (Signed)
  Phone Note Refill Request Message from:  Fax from Pharmacy on July 07, 2010 11:39 AM  Refills Requested: Medication #1:  HYDROCHLOROTHIAZIDE 25 MG  TABS Take 1 tab by mouth every morning   Dosage confirmed as above?Dosage Confirmed   Notes: [facility] Initial call taken by: Robin Ewing CMA ([facility]AMA),  July 07, 2010 11:39 AM    Prescriptions: HYDROCHLOROTHIAZIDE 25 MG  TABS (HYDROCHLOROTHIAZIDE) Take 1 tab by mouth every morning  #90 x 3   Entered by:   Scharlene Gloss CMA (AAMA)   Authorized by:   Corwin Levins MD   Signed by:   Scharlene Gloss CMA (AAMA) on 07/07/2010   Method used:   Faxed to ...       [facility] (mail-order)       [address]       [place], [place]  [address]       Ph: [phone]       Fax: [phone]   RxID:   [id number]

## 2010-11-07 ENCOUNTER — Ambulatory Visit (INDEPENDENT_AMBULATORY_CARE_PROVIDER_SITE_OTHER): Payer: Medicare Other | Admitting: Internal Medicine

## 2010-11-07 ENCOUNTER — Encounter: Payer: Self-pay | Admitting: Internal Medicine

## 2010-11-07 DIAGNOSIS — L989 Disorder of the skin and subcutaneous tissue, unspecified: Secondary | ICD-10-CM

## 2010-11-07 DIAGNOSIS — I10 Essential (primary) hypertension: Secondary | ICD-10-CM

## 2010-11-07 DIAGNOSIS — M109 Gout, unspecified: Secondary | ICD-10-CM

## 2010-11-07 DIAGNOSIS — J4489 Other specified chronic obstructive pulmonary disease: Secondary | ICD-10-CM

## 2010-11-07 DIAGNOSIS — J449 Chronic obstructive pulmonary disease, unspecified: Secondary | ICD-10-CM

## 2010-11-07 HISTORY — DX: Disorder of the skin and subcutaneous tissue, unspecified: L98.9

## 2010-11-11 NOTE — Assessment & Plan Note (Signed)
Summary: 6 MTH FU--STC---PER INSTR RS TO 4 MTH FU STC   Vital Signs:  Patient profile:   77 year old male Height:      65 inches Weight:      184.50 pounds BMI:     30.81 O2 Sat:      95 % on Room air Temp:     97.6 degrees F oral Pulse rate:   62 / minute BP sitting:   116 / 60  (left arm) Cuff size:   regular  Vitals Entered By: Zella Ball Ewing CMA Duncan Dull) (November 07, 2010 9:33 AM)  O2 Flow:  Room air CC: 6 month ROV/RE   Primary Care Provider:  Corwin Levins MD  CC:  6 month ROV/RE.  History of Present Illness: here to f/u;  overall doing ok;  Right wirst no longer pain/red/swollen as with prior gout and no other signifciant joint symptoms;  Pt denies CP, worsening sob, doe, wheezing, orthopnea, pnd, worsening LE edema, palps, dizziness or syncope  Pt denies new neuro symptoms such as headache, facial or extremity weakness  Pt denies polydipsia, polyuria  Overall good compliance with meds, trying to follow low chol  diet, wt stable, little excercise however   Denies worsening depressive symptoms, suicidal ideation, or panic.   Still quite hard of hearing.  No fever, wt loss, night sweats, loss of appetite or other constitutional symptoms  Overall good compliance with meds, and good tolerability.   still smoking - not considering quitting  Problems Prior to Update: 1)  Skin Lesion  (ICD-709.9) 2)  Gout  (ICD-274.9) 3)  Conjunctivitis, Allergic, Chronic  (ICD-372.14) 4)  Constipation  (ICD-564.00) 5)  Gerd  (ICD-530.81) 6)  Wrist Pain, Right  (ICD-719.43) 7)  Back Pain  (ICD-724.5) 8)  Plantar Fasciitis, Right  (ICD-728.71) 9)  Preventive Health Care  (ICD-V70.0) 10)  Chest Pain  (ICD-786.50) 11)  Allergic Rhinitis  (ICD-477.9) 12)  Vertigo  (ICD-780.4) 13)  Nephrolithiasis, Hx of  (ICD-V13.01) 14)  Renal Insufficiency  (ICD-588.9) 15)  Chronic Obstructive Pulmonary Disease, Acute Exacerbation  (ICD-491.21) 16)  Preventive Health Care  (ICD-V70.0) 17)  Special Screening  Malignant Neoplasm of Prostate  (ICD-V76.44) 18)  Routine General Medical Exam@health  Care Fac[facility]  (ICD-V70.0) 19)  Renal Cell Cancer  (ICD-189.0) 20)  Transient Ischemic Attack, Hx of  (ICD-V12.50) 21)  COPD  (ICD-496) 22)  Obesity  (ICD-278.00) 23)  Cerebrovascular Accident, Hx of  (ICD-V12.50) 24)  Benign Prostatic Hypertrophy  (ICD-600.00) 25)  Peripheral Vascular Disease  (ICD-443.9) 26)  Peptic Ulcer Disease  (ICD-533.90) 27)  Hypertension  (ICD-401.9) 28)  Hyperlipidemia  (ICD-272.4)  Medications Prior to Update: 1)  Hydrochlorothiazide 25 Mg  Tabs (Hydrochlorothiazide) .... Take 1 Tab By Mouth Every Morning 2)  Amlodipine Besylate 5 Mg  Tabs (Amlodipine Besylate) .Marland Kitchen.. 1 By Mouth Qd 3)  Pravastatin Sodium 40 Mg  Tabs (Pravastatin Sodium) .Marland Kitchen.. 1 By Mouth Once Daily 4)  Omeprazole 20 Mg Cpdr (Omeprazole) .... By Mouth Once Daily 5)  Ecotrin 325 Mg  Tbec (Aspirin) .Marland Kitchen.. 1 By Mouth Qd 6)  Oscal 500/200 D-3 500-200 Mg-Unit  Tabs (Calcium-Vitamin D) .Marland Kitchen.. 1 By Mouth Once Daily 7)  Meclizine Hcl 12.5 Mg  Tabs (Meclizine Hcl) .Marland Kitchen.. 1 By Mouth Qid Prn 8)  Cetirizine Hcl 10 Mg  Tabs (Cetirizine Hcl) .Marland Kitchen.. 1 By Mouth Once Daily Prn 9)  Spiriva Handihaler 18 Mcg Caps (Tiotropium Bromide Monohydrate) .... Use Asd 1 Once Daily 10)  Flexeril 5 Mg Tabs (  Cyclobenzaprine Hcl) .Marland Kitchen.. 1 By Mouth Three Times A Day As Needed 11)  Prednisone 10 Mg Tabs (Prednisone) .... 3po Qd For 3days, Then 2po Qd For 3days, Then 1po Qd For 3days, Then Stop 12)  Tylenol With Codeine #3 300-30 Mg Tabs (Acetaminophen-Codeine) .Marland Kitchen.. 1po Q 6 Hrs As Needed Pain 13)  Promethazine Hcl 25 Mg Tabs (Promethazine Hcl) .Marland Kitchen.. 1po Q 6 Hrs As Needed Nausea 14)  Miralax  Powd (Polyethylene Glycol 3350) .Marland KitchenMarland KitchenMarland Kitchen 17 Gm in Q Glass Water Per Day 15)  Allopurinol 100 Mg Tabs (Allopurinol) .Marland Kitchen.. 1po Once Daily 16)  Indomethacin 50 Mg Caps (Indomethacin) .Marland Kitchen.. 1po Three Times A Day As Needed Acute Gout Only  Current Medications (verified): 1)   Hydrochlorothiazide 25 Mg  Tabs (Hydrochlorothiazide) .... Take 1 Tab By Mouth Every Morning 2)  Amlodipine Besylate 5 Mg  Tabs (Amlodipine Besylate) .Marland Kitchen.. 1 By Mouth Qd 3)  Pravastatin Sodium 40 Mg  Tabs (Pravastatin Sodium) .Marland Kitchen.. 1 By Mouth Once Daily 4)  Omeprazole 20 Mg Cpdr (Omeprazole) .... By Mouth Once Daily 5)  Ecotrin 325 Mg  Tbec (Aspirin) .Marland Kitchen.. 1 By Mouth Qd 6)  Oscal 500/200 D-3 500-200 Mg-Unit  Tabs (Calcium-Vitamin D) .Marland Kitchen.. 1 By Mouth Once Daily 7)  Meclizine Hcl 12.5 Mg  Tabs (Meclizine Hcl) .Marland Kitchen.. 1 By Mouth Qid Prn 8)  Cetirizine Hcl 10 Mg  Tabs (Cetirizine Hcl) .Marland Kitchen.. 1 By Mouth Once Daily Prn 9)  Spiriva Handihaler 18 Mcg Caps (Tiotropium Bromide Monohydrate) .... Use Asd 1 Once Daily 10)  Flexeril 5 Mg Tabs (Cyclobenzaprine Hcl) .Marland Kitchen.. 1 By Mouth Three Times A Day As Needed 11)  Prednisone 10 Mg Tabs (Prednisone) .... 3po Qd For 3days, Then 2po Qd For 3days, Then 1po Qd For 3days, Then Stop 12)  Tylenol With Codeine #3 300-30 Mg Tabs (Acetaminophen-Codeine) .Marland Kitchen.. 1po Q 6 Hrs As Needed Pain 13)  Promethazine Hcl 25 Mg Tabs (Promethazine Hcl) .Marland Kitchen.. 1po Q 6 Hrs As Needed Nausea 14)  Miralax  Powd (Polyethylene Glycol 3350) .Marland KitchenMarland KitchenMarland Kitchen 17 Gm in Q Glass Water Per Day 15)  Allopurinol 100 Mg Tabs (Allopurinol) .Marland Kitchen.. 1po Once Daily 16)  Indomethacin 50 Mg Caps (Indomethacin) .Marland Kitchen.. 1po Three Times A Day As Needed Acute Gout Only  Allergies (verified): 1)  ! Sulfa  Past History:  Past Medical History: Last updated: 07/04/2010 Hyperlipidemia Hypertension Peptic ulcer disease Peripheral vascular disease Benign prostatic hypertrophy Cerebrovascular accident, hx of Genitourinary Tumor, hx of - renal cell cancer Obesity COPD Transient ischemic attack, hx of cancer, renal cell carcinoma Renal insufficiency - cr 1.8 Nephrolithiasis, hx of Allergic rhinitis right wrist avascular necrosis wrsit bone june 2010 - dr Melvyn Novas  - pt declined surgury GERD Gout  Past Surgical History: Last updated:  08/17/2007 Cataract extraction Ear Surgery Nephroureterectomy Nephrectomy s/p left knee replacement 10/08 s/p bilat fem-pop bypass  Social History: Last updated: 07/04/2010 Current Smoker Alcohol use-no married > 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   Head:  normocephalic and atraumatic.   Eyes:  vision grossly intact, pupils equal, and pupils round Ears:  R ear normal and L ear normal.  Nose:  no external deformity and no nasal discharge.   Mouth:  no gingival abnormalities and pharynx pink and moist.   Neck:  supple and no  masses.   Lungs:  normal respiratory effort, R decreased breath sounds, and L decreased breath sounds.   Heart:  normal rate and regular rhythm.   Msk:  no joint tenderness and no joint swelling.   Extremities:  no edema, no erythema  Neurologic:  strength normal in all extremities and gait normal.   Skin:  left upper back with approx 3/4 somwehat two tones somewhat irreg nonraised lesion above the scapula   Impression & Recommendations:  Problem # 1:  GOUT (ICD-274.9)  His updated medication list for this problem includes:    Allopurinol 100 Mg Tabs (Allopurinol) .Marland Kitchen... 1po once daily  recnet uric acid normal, now asymtpo - educated, reassure, Continue all previous medications as before this visit    Problem # 2:  COPD (ICD-496)  His updated medication list for this problem includes:    Spiriva Handihaler 18 Mcg Caps (Tiotropium bromide monohydrate) ..... Use asd 1 once daily  Pulmonary Functions Reviewed: O2 sat: 95 (11/07/2010)     Vaccines Reviewed: Pneumovax: Pneumovax (07/30/2006)   Flu Vax: Fluvax 3+ (09/23/2009) stable overall by hx and exam, ok to continue meds/tx as is   Problem # 3:  SKIN LESION (ICD-709.9) atypical , left  upper back - I suggested refer to derm, he decloines at this time but may re-consider at later date  Problem # 4:  HYPERTENSION (ICD-401.9)  His updated medication list for this problem includes:    Hydrochlorothiazide 25 Mg Tabs (Hydrochlorothiazide) .Marland Kitchen... Take 1 tab by mouth every morning    Amlodipine Besylate 5 Mg Tabs (Amlodipine besylate) .Marland Kitchen... 1 by mouth qd  BP today: 116/60 Prior BP: 128/80 (07/04/2010)  Labs Reviewed: K+: 4.5 (07/04/2010) Creat: : 1.9 (07/04/2010)   Chol: 139 (03/18/2010)   HDL: 38.00 (03/18/2010)   LDL: 86 (03/18/2010)   TG: 77.0 (03/18/2010) stable overall by hx and exam, ok to continue meds/tx as is   Complete Medication List: 1)  Hydrochlorothiazide 25 Mg Tabs (Hydrochlorothiazide) .... Take 1 tab by mouth every morning 2)  Amlodipine Besylate 5 Mg Tabs (Amlodipine besylate) .Marland Kitchen.. 1 by mouth qd 3)  Pravastatin Sodium 40 Mg Tabs (Pravastatin sodium) .Marland Kitchen.. 1 by mouth once daily 4)  Omeprazole 20 Mg Cpdr (Omeprazole) .... By mouth once daily 5)  Ecotrin 325 Mg Tbec (Aspirin) .Marland Kitchen.. 1 by mouth qd 6)  Oscal 500/200 D-3 500-200 Mg-unit Tabs (Calcium-vitamin d) .Marland Kitchen.. 1 by mouth once daily 7)  Meclizine Hcl 12.5 Mg Tabs (Meclizine hcl) .Marland Kitchen.. 1 by mouth qid prn 8)  Cetirizine Hcl 10 Mg Tabs (Cetirizine hcl) .Marland Kitchen.. 1 by mouth once daily prn 9)  Spiriva Handihaler 18 Mcg Caps (Tiotropium bromide monohydrate) .... Use asd 1 once daily 10)  Flexeril 5 Mg Tabs (Cyclobenzaprine hcl) .Marland Kitchen.. 1 by mouth three times a day as needed 11)  Prednisone 10 Mg Tabs (Prednisone) .... 3po qd for 3days, then 2po qd for 3days, then 1po qd for 3days, then stop 12)  Tylenol With Codeine #3 300-30 Mg Tabs (Acetaminophen-codeine) .Marland Kitchen.. 1po q 6 hrs as needed pain 13)  Promethazine Hcl 25 Mg Tabs (Promethazine hcl) .Marland Kitchen.. 1po q 6 hrs as needed nausea 14)  Miralax Powd (Polyethylene glycol 3350) .Marland KitchenMarland Kitchen. 17 gm in q glass water per day 15)  Allopurinol 100 Mg Tabs (Allopurinol) .Marland Kitchen.. 1po once daily 16)   Indomethacin 50 Mg Caps (Indomethacin) .Marland Kitchen.. 1po three times a day as needed acute gout only  Patient Instructions: 1)  Continue all previous medications as before this visit  2)  Please consider seeing dermatology for exam 3)  Please schedule a follow-up appointment in 6 months for CPX with LABS   Orders Added: 1)  Est. Patient Level IV [21308]

## 2011-01-13 NOTE — Op Note (Signed)
[name], [name]               ACCOUNT NO.:  [id number]   MEDICAL RECORD NO.:  [id number]          PATIENT TYPE:  INP   LOCATION:  2899                         FACILITY:  [facility]   PHYSICIAN:  Harvie Junior, M.D.   DATE OF BIRTH:  01-Oct-1924   DATE OF PROCEDURE:  06/13/2007  DATE OF DISCHARGE:                               OPERATIVE REPORT   PREOPERATIVE DIAGNOSIS:  End stage joint degenerative joint disease,  bilateral knees.   POSTOPERATIVE DIAGNOSIS:  End stage joint degenerative joint disease,  bilateral knees.   PROCEDURE:  1. Left total knee replacement with Sigma system size 3 femur, size 3      tibia, 12.5-mm bridging bearing and a 38 mm all polyethylene      patella.  2. Computer-assisted total knee replacement.   SURGEON:  Harvie Junior, M.D.   ASSISTANT:  Marshia Ly, P.A.   ANESTHESIA:  General.   BRIEF HISTORY:  [name] is an 77 year old male with a long history of  having had severe end stage degenerative joint disease of bilateral  knees.  He had been treated conservatively for a  prolonged period of  time.  Because of failure of all conservative care he was ultimately  taken to the operating room for total knee replacement.  We discussed  which to do first and ultimately felt that left was hurting him more  than right so we planned to proceed with the left total knee replacement  first.  He received a preoperative clearance and has a significant  cardiac history and for this reason we discussed not wanting to use  intramedullary alignment jigs, because of this ultimately felt that  computer assistance of extramedullary alignment was going to be  appropriate.  He was brought to the operating room for this procedure.   PROCEDURE:  Patient was brought to operating room.  After adequate  obtained with general anesthetic, patient placed supine on operating  table, the left leg prepped and draped in sterile fashion.  Following  this, the left leg was  exsanguinated.  Blood pressure tourniquet  inflated to 350 mmHg.  Following this, a midline incision was made.  Subcutaneous tissues dissected down to the level of the extensor  mechanism and medial parapatellar arthrotomy was undertaken and the  tissue was dissected down to into the knee.  Medial and lateral  meniscectomy was performed as well as anterior and posterior cruciate  __________  and retropatellar fat pad excision.  At this point the  computer assistance modules were aligned, two pins in the tibia, two  pins in the femur.  Adds about 20 minutes to the overall surgical case.  This was done and then once this was done the registration process was  undertaken and following this, the tibia was cut perpendicular to its  long axis, femur cut perpendicular to its long axis.  After this spacer  blocks were chosen and just to test the extension gap at this point the  femoral cutting jig was used to allow for 3 degrees of external rotation  and the holes were checked versus the  computer assistance module which  showed a perfect rotational alignment.  At that point the size 3 cutting  block was used.  Anterior posterior cuts were made as well as chamfers  and following this, size 3 femur was placed on the femoral side.  The  tibia was then sized to a 3 rotation was aligned and then it was drilled  and cut.  Once this was completed, attention was turned towards placing  the trial tibia with a 12.5 bridging bearing and the patella was then  sized to a 38 and 14 mm patella were left and the 38 paddle was used to  do the drill holes.  Once that was completed attention was turned  towards the overall long alignment, perfect neutral long alignment as  well as stability, easy full extension and at flexion the gaps were  perfectly balanced.  At this point all trial components were removed.  The knee was copiously and thoroughly lavaged and then suctioned dry.  The final components were then  cemented into place, size 3 femur, size 3  tibia, 12.5 trial poly and a 38-mm all poly patella.  Once this was  completed, the computer was again used and showed perfect neutral long  alignment as well as perfect gap balance in flexion.  Once the cement  was allowed to harden, the trial poly was removed.  The tourniquet was  let down.  All bleeders controlled with electrocautery.  The knee was  closed and thoroughly irrigated and the final polyethylene was put in  place.  A final check was made in the computer and this was released and  the pins were removed.  The medial parapatellar arthrotomy was closed  with one Vicryl running suture and the skin with 0 and 2-0 Vicryl and  skin staples.  Sterile compressive dressing was applied as well as a  knee immobilizer.  The patient taken to recovery room and noted be in  satisfactory condition.  The estimated blood loss for this procedure was  less than 100 mL.      Harvie Junior, M.D.  Electronically Signed     JLG/MEDQ  D:  06/13/2007  T:  06/14/2007  Job:  [id number]

## 2011-01-13 NOTE — Op Note (Signed)
NAMELAMARIUS, DIRR               ACCOUNT NO.:  [id number]   MEDICAL RECORD NO.:  [id number]          PATIENT TYPE:  INP   LOCATION:  5041                         FACILITY:  [facility]   PHYSICIAN:  Harvie Junior, M.D.   DATE OF BIRTH:  Apr 25, 1925   DATE OF PROCEDURE:  10/24/2007  DATE OF DISCHARGE:                               OPERATIVE REPORT   PREOPERATIVE DIAGNOSIS:  End stage degenerative joint disease, right  knee.   POSTOPERATIVE DIAGNOSIS:  End stage degenerative joint disease, right  knee.   PROCEDURE:  1. Right total knee replacement of the Sigma system size 3 femur, size      4 tibia, 10-mm bridging bearing, 38 mm all polyethylene patella.      #2.  Computer-assisted right total knee replacement.   SURGEON:  Harvie Junior, M.D.   ASSISTANT:  Marshia Ly, P.A.   ANESTHESIA:  General.   BRIEF HISTORY:  Mr. Tumolo is an 77 year old male with a long history of  having severe bilateral degenerative joint disease of the knee.  We had  done his left total knee about four months ago.  He did well with that.  Because of continued complaint of pain in his right knee, he came for  right total knee replacement.   PROCEDURE:  Because of his youthful appearance and deformity, I felt  that computer assistance was appropriate course of action so this was  chosen to be used preoperatively.   PROCEDURE:  The patient taken to the operating room and after adequate  anesthesia was obtained with general anesthetic, the patient was placed  supine on the operating table.  The right leg was prepped and draped in  the usual sterile fashion.  Following this a midline incision was made.  Following this the leg was exsanguinated, blood pressure tourniquet was  inflated to 250 mmHg.  Following this, a midline incision was made and  subcutaneous tissue dissected down to the level of the extensor  mechanism.  Medial parapatellar arthrotomy was undertaken and following  this attention was  turned towards excision of the medial and lateral  meniscus, anterior and posterior cruciate and the retropatellar fat pad.  Once this was completed, attention was turned towards the computer  assisted module.  Two pins were placed in the tibia, two pins in the  femur and then the arrays were placed.  Once this was done the neutral  of the long alignment was checked and reasonable long alignment could be  obtained and at this point the tibia was cut perpendicular to its long  axis.  The femur was cut perpendicular to the anatomic axis and the  spacer blocks were used.  At this point we did go back a little bit more  tibia just to create a little bit more room in the extension gap.  At  this time the multiple sizing block was used to size the femur to a size  3 and the rotational alignment was set with a combination of computer-  assisted and visual assistance.  At this point the anterior and  posterior cuts were  made as well as the chamfer cuts and the box was  cut.  Following this, attention was turned to the tibia which was sized  to a 4 and the keel was drilled and then rotational alignment set with  this.  Once this was completed, attention was turned towards putting in  the trial components and the trial was undertaken.  Perfect neutral long  alignment, perfect gap balancing and perfect gap balancing at 90 degrees  of flexion.  Once this was completed, attention was turned towards the  patella.  A 38 mm patella was chosen as appropriate size after patella  was measured and cut to a level 14 and a 38-mm all poly patella was  chosen.  This was put in place.  Following this, attention was turned  towards the towards removal of all trial components and the final  components were cemented in place.  The computer then was checked.  Perfect neutral long alignment, perfect gap balance in extension the  gaps within 5 mm at 90 degrees of flexion.  At this point the computer  assistance module  was taken out.  The cement was allowed to harden.  Once the cement was hardened, a medium Hemovac drain was put in place  and the trial poly was taken out.  The tourniquet was let down and all  bleeders controlled with electrocautery and the knee was thoroughly  irrigated and suctioned out and the final polys put in place.  At this  point the medial parapatellar arthrotomy was closed with 1-0 Vicryl  running and the skin with 0-0 and 2-0 Vicryl and skin staples.  Sterile  compressive dressing was applied as well as a knee immobilizer and the  patient taken to recovery where he was noted to be in satisfactory  condition.  The estimated blood loss for the procedure was less than 50  mL.      Harvie Junior, M.D.  Electronically Signed     JLG/MEDQ  D:  10/24/2007  T:  10/25/2007  Job:  [id number]

## 2011-01-13 NOTE — Procedures (Signed)
BYPASS GRAFT EVALUATION   INDICATION:  Status post bilateral lower extremity femoral popliteal  bypass graft with intermittent left knee pain.   HISTORY:  Diabetes:  Yes.  Cardiac:  No.  Hypertension:  Yes.  Smoking:  Yes.  Previous Surgery:  Status post right fem-pop in 1995, left fem-pop in  1999, both by Dr. Arbie Cookey.    SINGLE LEVEL ARTERIAL EXAM                               RIGHT              LEFT  Brachial:                    181.               169.  Anterior tibial:             189.               161.  Posterior tibial:            180.               177.  Peroneal:  Ankle/brachial index:        1.04.              0.98.   PREVIOUS ABI:  Date: 10/17/1999.  RIGHT: > 1.0.  LEFT: > 1.0.   LOWER EXTREMITY BYPASS GRAFT DUPLEX EXAM:  See attached sheet for velocities.   DUPLEX:  Bilateral fem-pop bypass grafts show evidence of a combination  of triphasic and biphasic arterial wave forms proximal to, within, and  distal to grafts with no evidence of focal stenoses.  Evidence of  bilateral complex nonvascularized masses in popliteal fossae, suggestive  of Baker's cysts measuring on the right 1.39 cm x 2.83 cm, on the left  1.27 x 1.61 cm.   IMPRESSION:  1. Bilateral ABIs are within normal limits with a combination of      triphasic and biphasic flow.  2. Patent bilateral femoral-popliteal bypass grafts.  3. 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   ___________________________________________  Larina Earthly, M.D.   AS/MEDQ  D:  05/23/2007  T:  05/24/2007  Job:  [id number]

## 2011-01-16 NOTE — Discharge Summary (Signed)
[name], [name]               ACCOUNT NO.:  [id number]   MEDICAL RECORD NO.:  [id number]          PATIENT TYPE:  INP   LOCATION:  5041                         FACILITY:  [facility]   PHYSICIAN:  Harvie Junior, M.D.   DATE OF BIRTH:  15-Mar-1925   DATE OF ADMISSION:  10/24/2007  DATE OF DISCHARGE:  10/28/2007                               DISCHARGE SUMMARY   ADMITTING DIAGNOSES:  1. End-stage degenerative joint disease, right knee.  2. Hypertension.  3. Coronary artery disease.  4. History of left renal cancer 9 years ago.   DISCHARGE DIAGNOSES:  1. End-stage degenerative joint disease right knee.  2. Hypertension.  3. Coronary artery disease.  4. History of left renal cancer 9 years ago.   PROCEDURES IN THE HOSPITAL:  Right total knee arthroplasty computer-  assisted, Kirk Geralds, MD, October 24, 2007.   BRIEF HISTORY:  However, [name] is a pleasant 77 year old well known to  [place] who is a male who has right knee pain.  Standing x-rays show bone-on-  bone degenerative arthritis of the right knee.  He had night pain and  pain with ambulation.  He got better relief with exhaustive conservative  treatment including cortisone injections, modification of his activity,  and use of medications.  Based upon his radiographic and clinical  findings, we felt to be a candidate for a right total knee replacement,  and he is admitted for this.   PERTINENT LABORATORY STUDIES:  EKG is previously from 06/26/2007,  showed right bundle branch block with sinus bradycardia.  Hemoglobin on  admission was 15.0, hematocrit 44.5, and indices were grossly within  normal limits.  On postop day #1, his hemoglobin was 12.6, #2 it was  10.4, and #3 it was 10.8.  Pro time on admission was 13.1 seconds, INR  of 1.0, and PTT of 30.  On the date of discharge, his pro time 18.2  seconds and INR was 2.8 on Coumadin therapy.  CMET on admission showed  minimally elevated BUN 26 and minimally elevated  creatinine of 1.76.  No  other abnormalities noted.  Urinalysis showed cloudy appearance with no  WBCs.   HOSPITAL COURSE:  The patient underwent right total knee arthroplasty  computer-assisted as well described in Dr. Luiz Blare' operative note on  October 24, 2007.  Postoperatively, he was put on a PCA morphine pump  for pain control.  On postop day #1, he had moderate knee pain.  He was  also given prophylactic antibiotics preoperatively as well as 24 hours  postoperatively.  He had some nausea and vomiting.  Initially, his  hemoglobin was 12.6.  His INR was 1.1.  He has gotten into physical  therapy, weightbearing as tolerated on the right with a walker.  CPM  machine was used for range of motion of the right knee.  On  postoperative day #2, he had complaints of knee pain as we expected.  Foley catheter was placed at time of surgery, was still intact, but then  it was discontinued.  He had a low grade fever of 100.4, then found to  be afebrile, and his vital signs were stable.  His INR was up to 1.7.  His dressing was changed.  His Foley catheter discontinued.  His IV was  converted to a saline lock.  PCA morphine pump was discontinued.  He  continued to make good progress with physical therapy, and rest of his  hospitalization went without significant complications.  His INR did go  up to 3.5.  On the date of discharge which was November 25, 2007, it was  down to 2.8.  On postop day #4, November 25, 2007, he was ready to go home.  He had some hallucinations on Percocet, so that was changed.  He is  taking fluids and voiding without difficulty and ambulating well with  physical therapy.  His INR was 2.8.  His right knee wound was benign.  His calves are soft and nontender.  Neurovascular status was intact in  the right lower extremity.   Discharged to home in improved condition, on regular diet.  He was given  prescription for Lortab 10 mg one to two q.6 h p.r.n. pain.  Also for  Coumadin  x1 month postop.  He is to start to ambulate weightbearing as  tolerated on the right with a walker.  Home health physical therapy 3  times a week and home Coumadin management times 1 month postop.  He will  follow up Dr. Luiz Blare in the office in 10 days.      Marshia Ly, P.A.      Harvie Junior, M.D.  Electronically Signed    JB/MEDQ  D:  12/08/2007  T:  12/09/2007  Job:  [id number]   cc:   Vonzell Schlatter. Patsi Sears, M.D.  Annamary Rummage

## 2011-01-16 NOTE — Discharge Summary (Signed)
[name], [name]               ACCOUNT NO.:  [id number]   MEDICAL RECORD NO.:  [id number]          PATIENT TYPE:  INP   LOCATION:  5007                         FACILITY:  [facility]   PHYSICIAN:  [name], M.D.   DATE OF BIRTH:  14-Jul-1925   DATE OF ADMISSION:  06/13/2007  DATE OF DISCHARGE:  06/16/2007                               DISCHARGE SUMMARY   ADMISSION DIAGNOSES:  1. End-stage bone-on-bone degenerative joint disease, left knee.  2. Hypertension.  3. Coronary artery disease.  4. History of left renal cancer, currently stable.   DISCHARGE DIAGNOSES:  1. End-stage bone-on-bone degenerative joint disease, left knee.  2. Hypertension.  3. Coronary artery disease.  4. History of left renal cancer, currently stable.  5. Blood loss anemia.  6. Renal insufficiency.   PROCEDURES IN HOSPITAL:  Left total knee arthroplasty, computer-  assisted, Kirk Geralds, MD, June 13, 2007.   BRIEF HISTORY:  Mr. [name] is a really pleasant 77 year old male who has  a long history of left knee pain and swelling.  He has pain with  ambulation and pain at night.  Standing x-rays show severe DJD with  tricompartment spurring and joint space narrowing, essentially bone-on-  bone.  He had no relief with conservative treatment including  medication, modification of activity and cortisone injection.  Based  upon his clinical radiographic findings, he was felt to be a candidate  for a left total knee replacement, and he is admitted for this.   PERTINENT LABORATORY STUDIES:  EKG on admission showed sinus bradycardia  with right bundle branch block, abnormal EKG.   Hemoglobin was 14.7, hematocrit 43.0.  Preoperatively and  postoperatively hemoglobin #1 was 12.3; #2 was 10.2; #3 was 10.3.  Indices were grossly within normal limits.  Pro time on admission was  13.0 seconds with INR of 1.0 and a PTT of 30. On day of discharge, his  pro time was 31.2 seconds with INR of 2.9 on Coumadin.  BMET on  admission showed slightly elevated creatinine of 185.  This was followed  on a daily basis through his hospitalization and was up to 2.01, on day  of discharge 1.79.  His BUN on the October 15 was 30; on day of  discharge was 24.  His GFR ranged from 35-37.  Liver studies on CMET  were unremarkable.  Urinalysis was unremarkable.   HOSPITAL COURSE:  The patient underwent left total knee arthroplasty as  well described in Dr. Luiz Chavez' operative note.  The patient was given  Ancef 1 gram IV preoperatively and gentamicin 80 mg IV preoperatively.  He was given 1 gram Ancef q.8 h x3 doses postop. He was put on PCA  morphine pump pain control and given oral pain medications.  He was  receiving physical therapy for walker ambulation, weightbearing as  tolerated on the left side.  CPM machine was used for knee range of  motion.  Physical therapy was ordered for walker ambulation,  weightbearing as tolerated on the left.   On postop day #1, he was without complaints.  Foley catheter that had  been was  placed at the time of surgery was intact.  He had not gotten  out of bed yet.  Hemoglobin was stable at 12.3.  His urine output was  good. O2Sats were 94% on 2 liters oxygen. His INR was 1.1 on Coumadin.  His left knee dressing was clean and dry with some mild bleeding at the  inferior wound, no active bleeding noted.  Foley catheter discontinued.  Physical therapy saw and evaluated the patient.   On  postop day #2, he had complaints of moderate knee pain.  His vital  signs were stable, and he was afebrile.  His hemoglobin was 10.2.  His  potassium was 4.2.  His INR was 1.6.  He had an elevated creatinine and  some blood loss anemia noted. His fluids were advanced. His IV was  converted to a Hep-Lock. His Foley catheter was discontinued.  He  continued on anticoagulation, continued with physical therapy.   On postop day #3, the patient was doing well and was ready to go home.  He was taking fluids  and voiding without difficulty.  Vital signs were  stable, and he was afebrile.  His left knee was benign.  On postop day  #2, his dressing had been changed,  and his Hemovac drain was pulled.  His hemoglobin was 10.3 on the day of discharge with an INR of 2.9.  His  BUN was 33, creatinine 1.79.  His glucose was 105.  He was discharged  home after seen by physical therapy.  He was in improved condition.  He  will be on a regular diet.   He was given a prescription for Percocet 5 mg 1 q.6 h p.r.n. pain with  no refills and Coumadin times 1 month postop for DVT prophylaxis per  outpatient home health pharmacy management.   ACTIVITY STATUS:  Weightbearing as tolerated on the left with a walker.  He will  use a home CPM machine for range of motion.   FOLLOWUP:  He will follow up with Dr. Luiz Chavez in the office in 2 weeks,  sooner if any problems occur.      [name], P.A.      [name], M.D.  Electronically Signed    [name]/MEDQ  D:  09/05/2007  T:  09/05/2007  Job:  [id number]   cc:   Kirk Levins, MD  [name], M.D.

## 2011-02-12 ENCOUNTER — Other Ambulatory Visit: Payer: Self-pay | Admitting: Internal Medicine

## 2011-04-16 ENCOUNTER — Other Ambulatory Visit: Payer: Self-pay

## 2011-04-16 MED ORDER — AMLODIPINE BESYLATE 5 MG PO TABS: 5.0000 mg | ORAL_TABLET | Freq: Every day | ORAL | Status: DC

## 2011-04-16 MED ORDER — PRAVASTATIN SODIUM 40 MG PO TABS: 40.0000 mg | ORAL_TABLET | Freq: Every evening | ORAL | Status: DC

## 2011-04-23 ENCOUNTER — Telehealth: Payer: Self-pay

## 2011-04-23 DIAGNOSIS — Z0000 Encounter for general adult medical examination without abnormal findings: Secondary | ICD-10-CM

## 2011-04-23 DIAGNOSIS — Z1289 Encounter for screening for malignant neoplasm of other sites: Secondary | ICD-10-CM

## 2011-04-23 NOTE — Telephone Encounter (Signed)
Put order in for labs. 

## 2011-04-24 DIAGNOSIS — C649 Malignant neoplasm of unspecified kidney, except renal pelvis: Secondary | ICD-10-CM

## 2011-05-01 ENCOUNTER — Other Ambulatory Visit: Payer: Medicare Other

## 2011-05-03 ENCOUNTER — Encounter: Payer: Self-pay | Admitting: Internal Medicine

## 2011-05-03 DIAGNOSIS — Z8673 Personal history of transient ischemic attack (TIA), and cerebral infarction without residual deficits: Secondary | ICD-10-CM | POA: Insufficient documentation

## 2011-05-03 DIAGNOSIS — Z0000 Encounter for general adult medical examination without abnormal findings: Secondary | ICD-10-CM | POA: Insufficient documentation

## 2011-05-03 DIAGNOSIS — I639 Cerebral infarction, unspecified: Secondary | ICD-10-CM

## 2011-05-03 HISTORY — DX: Cerebral infarction, unspecified: I63.9

## 2011-05-08 ENCOUNTER — Ambulatory Visit (INDEPENDENT_AMBULATORY_CARE_PROVIDER_SITE_OTHER): Payer: Medicare Other | Admitting: Internal Medicine

## 2011-05-08 ENCOUNTER — Encounter: Payer: Self-pay | Admitting: Internal Medicine

## 2011-05-08 ENCOUNTER — Other Ambulatory Visit (INDEPENDENT_AMBULATORY_CARE_PROVIDER_SITE_OTHER): Payer: Medicare Other

## 2011-05-08 VITALS — BP 104/60 | HR 64 | Temp 97.5°F | Ht 65.0 in | Wt 186.4 lb

## 2011-05-08 DIAGNOSIS — Z23 Encounter for immunization: Secondary | ICD-10-CM

## 2011-05-08 DIAGNOSIS — Z79899 Other long term (current) drug therapy: Secondary | ICD-10-CM

## 2011-05-08 DIAGNOSIS — Z0000 Encounter for general adult medical examination without abnormal findings: Secondary | ICD-10-CM

## 2011-05-08 LAB — CBC WITH DIFFERENTIAL/PLATELET
Basophils Relative: 0.2 % (ref 0.0–3.0)
Eosinophils Relative: 1.6 % (ref 0.0–5.0)
HCT: 44.3 % (ref 39.0–52.0)
MCV: 98.5 fl (ref 78.0–100.0)
Monocytes Absolute: 1.5 10*3/uL — ABNORMAL HIGH (ref 0.1–1.0)
Neutrophils Relative %: 58.4 % (ref 43.0–77.0)
RBC: 4.5 Mil/uL (ref 4.22–5.81)
WBC: 14.9 10*3/uL — ABNORMAL HIGH (ref 4.5–10.5)

## 2011-05-08 LAB — LIPID PANEL
LDL Cholesterol: 64 mg/dL (ref 0–99)
Total CHOL/HDL Ratio: 3
Triglycerides: 123 mg/dL (ref 0.0–149.0)
VLDL: 24.6 mg/dL (ref 0.0–40.0)

## 2011-05-08 LAB — URINALYSIS, ROUTINE W REFLEX MICROSCOPIC
Bilirubin Urine: NEGATIVE
Nitrite: NEGATIVE
Total Protein, Urine: NEGATIVE
pH: 6 (ref 5.0–8.0)

## 2011-05-08 LAB — HEPATIC FUNCTION PANEL
ALT: 13 U/L (ref 0–53)
Total Bilirubin: 0.6 mg/dL (ref 0.3–1.2)

## 2011-05-08 LAB — BASIC METABOLIC PANEL
BUN: 30 mg/dL — ABNORMAL HIGH (ref 6–23)
Chloride: 102 mEq/L (ref 96–112)
Creatinine, Ser: 2.1 mg/dL — ABNORMAL HIGH (ref 0.4–1.5)
GFR: 31.93 mL/min — ABNORMAL LOW (ref >60.00)

## 2011-05-08 MED ORDER — PNEUMOCOCCAL VAC POLYVALENT 25 MCG/0.5ML IJ INJ: 0.5000 mL | INJECTION | Freq: Once | INTRAMUSCULAR | Status: DC

## 2011-05-08 MED ORDER — MECLIZINE HCL 12.5 MG PO TABS: 12.5000 mg | ORAL_TABLET | Freq: Four times a day (QID) | ORAL | Status: DC | PRN

## 2011-05-08 NOTE — Patient Instructions (Addendum)
Your meclizine was refilled today Please have the pharmacy call if you need further refills Please go to LAB in the Basement for the blood and/or urine tests to be done today Please call the phone number [phone] (the [employer] System) for results of testing in 2-3 days;  When calling, simply dial the number, and when prompted enter the MRN number above (the Medical Record Number) and the # key, then the message should start. You had the flu shot today, and the pneumonia shot Your handicap sticker form was filled out today Please return in 6 months, or sooner if needed

## 2011-05-09 ENCOUNTER — Encounter: Payer: Self-pay | Admitting: Internal Medicine

## 2011-05-09 NOTE — Progress Notes (Signed)
Subjective:    Patient ID: [name], male    DOB: 01-Jan-1925, 77 y.o.   MRN: [id number]  HPI  Here with wife of 64 yrs, very supportive.  Here for wellness and f/u;  Overall doing ok;  Pt denies CP, worsening SOB, DOE, wheezing, orthopnea, PND, worsening LE edema, palpitations, dizziness or syncope.  Pt denies neurological change such as new Headache, facial or extremity weakness.  Pt denies polydipsia, polyuria, or low sugar symptoms. Pt states overall good compliance with treatment and medications, good tolerability, and trying to follow lower cholesterol diet.  Pt denies worsening depressive symptoms, suicidal ideation or panic. No fever, wt loss, night sweats, loss of appetite, or other constitutional symptoms.  Pt states good ability with ADL's, low fall risk, home safety reviewed and adequate, no significant changes in hearing or vision, and occasionally active with exercise.  Has significant hearing loss and uses a cane for balance occasionally. Past Medical History  Diagnosis Date  . ALLERGIC RHINITIS 09/29/2007  . BACK PAIN 01/31/2009  . BENIGN PROSTATIC HYPERTROPHY 05/12/2007  . CHEST PAIN 01/31/2008  . CHRONIC OBSTRUCTIVE PULMONARY DISEASE, ACUTE EXACERBATION 08/17/2007  . CONJUNCTIVITIS, ALLERGIC, CHRONIC 03/24/2010  . CONSTIPATION 02/19/2010  . COPD 05/12/2007  . GERD 02/23/2010  . GOUT 07/04/2010  . HYPERLIPIDEMIA 05/12/2007  . HYPERTENSION 05/12/2007  . NEPHROLITHIASIS, HX OF 08/17/2007  . OBESITY 05/12/2007  . PEPTIC ULCER DISEASE 05/12/2007  . PERIPHERAL VASCULAR DISEASE 05/12/2007  . Personal history of unspecified circulatory disease 05/12/2007  . PLANTAR FASCIITIS, RIGHT 09/27/2008  . RENAL CELL CANCER 05/12/2007  . RENAL INSUFFICIENCY 08/17/2007  . SKIN LESION 11/07/2010  . VERTIGO 09/29/2007  . WRIST PAIN, RIGHT 02/26/2009  . History of CVA (cerebrovascular accident) 05/03/2011   Past Surgical History  Procedure Date  . Cataract extraction   . Ear surgury   . Nephroureterectomy    . Nephrectomy   . S.p left knee replacement 10/08  . S/p bilat fem pop bypass     reports that he has been smoking.  He does not have any smokeless tobacco history on file. He reports that he does not drink alcohol or use illicit drugs. family history includes Alcohol abuse in his brother; Cancer in his daughter, mother, and others; and Heart disease in his brother. Allergies  Allergen Reactions  . Sulfonamide Derivatives     REACTION: rash   Current Outpatient Prescriptions on File Prior to Visit  Medication Sig Dispense Refill  . allopurinol (ZYLOPRIM) 100 MG tablet Take 100 mg by mouth daily.        Marland Kitchen amLODipine (NORVASC) 5 MG tablet Take 1 tablet (5 mg total) by mouth daily.  90 tablet  2  . aspirin 325 MG tablet Take 325 mg by mouth daily.        . calcium-vitamin D (OSCAL WITH D) 500-200 MG-UNIT per tablet Take 1 tablet by mouth daily.        . cetirizine (ZYRTEC) 10 MG tablet Take 10 mg by mouth daily.        . cyclobenzaprine (FLEXERIL) 5 MG tablet Take 5 mg by mouth 3 (three) times daily as needed.        . hydrochlorothiazide 25 MG tablet Take 25 mg by mouth daily.        . indomethacin (INDOCIN) 50 MG capsule Take 50 mg by mouth 3 (three) times daily with meals. For acute gout only       . omeprazole (PRILOSEC) 20 MG capsule TAKE ONE  CAPSULE BY MOUTH EVERY DAY  30 capsule  6  . polyethylene glycol powder (MIRALAX) powder Take 17 g by mouth daily.        . pravastatin (PRAVACHOL) 40 MG tablet Take 1 tablet (40 mg total) by mouth every evening.  90 tablet  2  . promethazine (PHENERGAN) 25 MG tablet Take 25 mg by mouth every 6 (six) hours as needed.        . tiotropium (SPIRIVA) 18 MCG inhalation capsule Place 18 mcg into inhaler and inhale daily.         No current facility-administered medications on file prior to visit.   Review of Systems Review of Systems  Constitutional: Negative for diaphoresis, activity change, appetite change and unexpected weight change.  HENT:  Negative for hearing loss, ear pain, facial swelling, mouth sores and neck stiffness.   Eyes: Negative for pain, redness and visual disturbance.  Respiratory: Negative for shortness of breath and wheezing.   Cardiovascular: Negative for chest pain and palpitations.  Gastrointestinal: Negative for diarrhea, blood in stool, abdominal distention and rectal pain.  Genitourinary: Negative for hematuria, flank pain and decreased urine volume.  Musculoskeletal: Negative for myalgias and joint swelling.  Skin: Negative for color change and wound.  Neurological: Negative for syncope and numbness.  Hematological: Negative for adenopathy.  Psychiatric/Behavioral: Negative for hallucinations, self-injury, decreased concentration and agitation.      Objective:   Physical Exam BP 104/60  Pulse 64  Temp(Src) 97.5 F (36.4 C) (Oral)  Ht 5\' 5"  (1.651 m)  Wt 186 lb 6 oz (84.539 kg)  BMI 31.01 kg/m2  SpO2 95% Physical Exam  VS noted Constitutional: Pt is oriented to person, place, and time. Appears well-developed and well-nourished.  HENT:  Head: Normocephalic and atraumatic.  Right Ear: External ear normal.  Left Ear: External ear normal.  Nose: Nose normal.  Mouth/Throat: Oropharynx is clear and moist.  Eyes: Conjunctivae and EOM are normal. Pupils are equal, round, and reactive to light.  Neck: Normal range of motion. Neck supple. No JVD present. No tracheal deviation present.  Cardiovascular: Normal rate, regular rhythm, normal heart sounds and intact distal pulses.   Pulmonary/Chest: Effort normal and breath sounds normal.  Abdominal: Soft. Bowel sounds are normal. There is no tenderness.  Musculoskeletal: Normal range of motion. Exhibits no edema. bilat knee DJD changes Lymphadenopathy:  Has no cervical adenopathy.  Neurological: Pt is alert and oriented to person, place, and time. Pt has normal reflexes. No cranial nerve deficit.  Skin: Skin is warm and dry. No rash noted.  Psychiatric:   Has  normal mood and affect. Behavior is normal.     Assessment & Plan:

## 2011-05-09 NOTE — Assessment & Plan Note (Signed)

## 2011-05-11 ENCOUNTER — Telehealth: Payer: Self-pay

## 2011-05-11 DIAGNOSIS — D72829 Elevated white blood cell count, unspecified: Secondary | ICD-10-CM

## 2011-05-11 NOTE — Telephone Encounter (Signed)
A user error has taken place: encounter opened in error, closed for administrative reasons.

## 2011-05-22 LAB — CBC
HCT: 31.1 — ABNORMAL LOW
HCT: 33.3 — ABNORMAL LOW
HCT: 36.3 — ABNORMAL LOW
Hemoglobin: 10.8 — ABNORMAL LOW
Hemoglobin: 11.4 — ABNORMAL LOW
MCHC: 33.7
MCHC: 34.6
MCHC: 34.8
MCV: 91.2
MCV: 91.6
Platelets: 286
Platelets: 354
RBC: 3.97 — ABNORMAL LOW
RDW: 13.9
RDW: 14.5
WBC: 12.4 — ABNORMAL HIGH
WBC: 17.2 — ABNORMAL HIGH

## 2011-05-22 LAB — URINALYSIS, ROUTINE W REFLEX MICROSCOPIC
Glucose, UA: NEGATIVE
Hgb urine dipstick: NEGATIVE
Ketones, ur: NEGATIVE
Protein, ur: NEGATIVE
pH: 5.5

## 2011-05-22 LAB — BASIC METABOLIC PANEL
BUN: 21
CO2: 26
Chloride: 104
Potassium: 3.8

## 2011-05-22 LAB — DIFFERENTIAL
Eosinophils Absolute: 0.3
Eosinophils Relative: 3
Lymphocytes Relative: 42
Lymphs Abs: 4.2 — ABNORMAL HIGH
Monocytes Relative: 10

## 2011-05-22 LAB — COMPREHENSIVE METABOLIC PANEL
ALT: 15
AST: 18
Calcium: 9.1
GFR calc Af Amer: 45 — ABNORMAL LOW
Sodium: 139
Total Protein: 7.1

## 2011-05-22 LAB — PROTIME-INR
INR: 1
INR: 1.7 — ABNORMAL HIGH
Prothrombin Time: 13.1

## 2011-05-22 LAB — APTT: aPTT: 30

## 2011-05-22 LAB — TYPE AND SCREEN: ABO/RH(D): O POS

## 2011-05-26 ENCOUNTER — Other Ambulatory Visit (INDEPENDENT_AMBULATORY_CARE_PROVIDER_SITE_OTHER): Payer: Medicare Other

## 2011-05-26 DIAGNOSIS — D72829 Elevated white blood cell count, unspecified: Secondary | ICD-10-CM

## 2011-05-26 LAB — CBC WITH DIFFERENTIAL/PLATELET
Basophils Relative: 0.3 % (ref 0.0–3.0)
Eosinophils Absolute: 0.5 10*3/uL (ref 0.0–0.7)
HCT: 43.4 % (ref 39.0–52.0)
Hemoglobin: 14.5 g/dL (ref 13.0–17.0)
Lymphs Abs: 4.1 10*3/uL — ABNORMAL HIGH (ref 0.7–4.0)
MCHC: 33.3 g/dL (ref 30.0–36.0)
MCV: 98.6 fl (ref 78.0–100.0)
Monocytes Absolute: 1.2 10*3/uL — ABNORMAL HIGH (ref 0.1–1.0)
Neutro Abs: 7.1 10*3/uL (ref 1.4–7.7)
RBC: 4.4 Mil/uL (ref 4.22–5.81)

## 2011-06-10 LAB — CBC
HCT: 29.4 — ABNORMAL LOW
MCHC: 34
Platelets: 256
Platelets: 259
RBC: 3.17 — ABNORMAL LOW
RDW: 12.6
RDW: 13.1

## 2011-06-10 LAB — BASIC METABOLIC PANEL
BUN: 30 — ABNORMAL HIGH
CO2: 29
Calcium: 7.8 — ABNORMAL LOW
Calcium: 8.2 — ABNORMAL LOW
Creatinine, Ser: 1.79 — ABNORMAL HIGH
GFR calc Af Amer: 44 — ABNORMAL LOW
GFR calc non Af Amer: 32 — ABNORMAL LOW
Glucose, Bld: 105 — ABNORMAL HIGH
Glucose, Bld: 140 — ABNORMAL HIGH
Potassium: 4.2

## 2011-06-10 LAB — DIFFERENTIAL
Basophils Absolute: 0
Basophils Relative: 0
Neutro Abs: 8.5 — ABNORMAL HIGH
Neutrophils Relative %: 65

## 2011-06-10 LAB — PROTIME-INR
INR: 2.9 — ABNORMAL HIGH
Prothrombin Time: 31.2 — ABNORMAL HIGH

## 2011-06-11 LAB — COMPREHENSIVE METABOLIC PANEL
AST: 15
Albumin: 3.5
Alkaline Phosphatase: 76
Chloride: 100
GFR calc Af Amer: 43 — ABNORMAL LOW
Potassium: 3.8
Total Bilirubin: 0.6
Total Protein: 6.1

## 2011-06-11 LAB — CBC
HCT: 36 — ABNORMAL LOW
MCV: 94.4
Platelets: 301
Platelets: 325
RDW: 12.7
WBC: 12.8 — ABNORMAL HIGH
WBC: 9.8

## 2011-06-11 LAB — DIFFERENTIAL
Basophils Absolute: 0
Basophils Relative: 0
Eosinophils Relative: 2
Lymphocytes Relative: 34
Monocytes Absolute: 0.9 — ABNORMAL HIGH
Monocytes Relative: 9

## 2011-06-11 LAB — URINALYSIS, ROUTINE W REFLEX MICROSCOPIC
Glucose, UA: NEGATIVE
Ketones, ur: NEGATIVE
Specific Gravity, Urine: 1.014
pH: 5.5

## 2011-06-11 LAB — BASIC METABOLIC PANEL
BUN: 21
Chloride: 102
Glucose, Bld: 182 — ABNORMAL HIGH
Potassium: 4.1

## 2011-07-09 ENCOUNTER — Other Ambulatory Visit: Payer: Self-pay

## 2011-07-09 MED ORDER — HYDROCHLOROTHIAZIDE 25 MG PO TABS: 25.0000 mg | ORAL_TABLET | Freq: Every day | ORAL | Status: DC

## 2011-07-17 ENCOUNTER — Other Ambulatory Visit: Payer: Self-pay

## 2011-07-17 MED ORDER — HYDROCHLOROTHIAZIDE 25 MG PO TABS: 25.0000 mg | ORAL_TABLET | Freq: Every day | ORAL | Status: DC

## 2011-09-03 ENCOUNTER — Other Ambulatory Visit: Payer: Self-pay | Admitting: Internal Medicine

## 2011-10-06 ENCOUNTER — Other Ambulatory Visit: Payer: Self-pay

## 2011-10-06 MED ORDER — PRAVASTATIN SODIUM 40 MG PO TABS: 40.0000 mg | ORAL_TABLET | Freq: Every evening | ORAL | Status: DC

## 2011-10-06 MED ORDER — MECLIZINE HCL 12.5 MG PO TABS: 12.5000 mg | ORAL_TABLET | Freq: Four times a day (QID) | ORAL | Status: DC | PRN

## 2011-10-06 MED ORDER — AMLODIPINE BESYLATE 5 MG PO TABS: 5.0000 mg | ORAL_TABLET | Freq: Every day | ORAL | Status: DC

## 2011-10-15 ENCOUNTER — Other Ambulatory Visit: Payer: Self-pay | Admitting: Internal Medicine

## 2011-11-06 ENCOUNTER — Other Ambulatory Visit: Payer: Self-pay | Admitting: Internal Medicine

## 2011-11-06 ENCOUNTER — Ambulatory Visit (INDEPENDENT_AMBULATORY_CARE_PROVIDER_SITE_OTHER)
Admission: RE | Admit: 2011-11-06 | Discharge: 2011-11-06 | Disposition: A | Discharge status: Home / Self Care | Payer: Medicare Other | Source: Ambulatory Visit | Attending: Internal Medicine | Admitting: Internal Medicine

## 2011-11-06 ENCOUNTER — Ambulatory Visit (INDEPENDENT_AMBULATORY_CARE_PROVIDER_SITE_OTHER): Payer: Medicare Other | Admitting: Internal Medicine

## 2011-11-06 ENCOUNTER — Encounter: Payer: Self-pay | Admitting: Internal Medicine

## 2011-11-06 ENCOUNTER — Other Ambulatory Visit (INDEPENDENT_AMBULATORY_CARE_PROVIDER_SITE_OTHER): Payer: Medicare Other

## 2011-11-06 VITALS — BP 132/70 | HR 68 | Temp 97.4°F | Ht 65.0 in | Wt 187.4 lb

## 2011-11-06 DIAGNOSIS — R05 Cough: Secondary | ICD-10-CM

## 2011-11-06 DIAGNOSIS — Z0000 Encounter for general adult medical examination without abnormal findings: Secondary | ICD-10-CM

## 2011-11-06 DIAGNOSIS — D72829 Elevated white blood cell count, unspecified: Secondary | ICD-10-CM

## 2011-11-06 DIAGNOSIS — N189 Chronic kidney disease, unspecified: Secondary | ICD-10-CM

## 2011-11-06 DIAGNOSIS — N259 Disorder resulting from impaired renal tubular function, unspecified: Secondary | ICD-10-CM

## 2011-11-06 DIAGNOSIS — I10 Essential (primary) hypertension: Secondary | ICD-10-CM

## 2011-11-06 DIAGNOSIS — R059 Cough, unspecified: Secondary | ICD-10-CM

## 2011-11-06 LAB — CBC WITH DIFFERENTIAL/PLATELET
Basophils Relative: 0.3 % (ref 0.0–3.0)
Eosinophils Absolute: 0.3 10*3/uL (ref 0.0–0.7)
Lymphs Abs: 4.1 10*3/uL — ABNORMAL HIGH (ref 0.7–4.0)
MCHC: 33.6 g/dL (ref 30.0–36.0)
MCV: 96.2 fl (ref 78.0–100.0)
Monocytes Absolute: 1.3 10*3/uL — ABNORMAL HIGH (ref 0.1–1.0)
Neutrophils Relative %: 57.9 % (ref 43.0–77.0)
RBC: 4.65 Mil/uL (ref 4.22–5.81)

## 2011-11-06 LAB — BASIC METABOLIC PANEL
CO2: 30 mEq/L (ref 19–32)
Chloride: 102 mEq/L (ref 96–112)
Glucose, Bld: 84 mg/dL (ref 70–99)
Potassium: 3.8 mEq/L (ref 3.5–5.1)

## 2011-11-06 NOTE — Telephone Encounter (Signed)
Left message on phone tree  - lab ok but WBC still mild elevated (but stable), and Creat has been creeping up as well over the past yr      1)   Refer to hematology for the elev wbc     2)   Refer to nephrology  For the worsening kidney fxn  Robin to inform pt's wife (he is hard of hearing);  I will do referrals

## 2011-11-06 NOTE — Patient Instructions (Signed)
Continue all other medications as before Please go to LAB in the Basement for the blood and/or urine tests to be done today Please go to XRAY in the Basement for the x-ray test Please call the phone number [phone] (the PhoneTree System) for results of testing in 2-3 days;  When calling, simply dial the number, and when prompted enter the MRN number above (the Medical Record Number) and the # key, then the message should start. Please stop smoking Please keep your appointments with your specialists as you have planned - Dr Patsi Sears Depending on your test results, you may need to see Hematology, or Nephrology (kidney doctors) Please return in 6 mo with Lab testing done 3-5 days before

## 2011-11-07 ENCOUNTER — Encounter: Payer: Self-pay | Admitting: Internal Medicine

## 2011-11-07 NOTE — Assessment & Plan Note (Signed)
Minor possilble smoker related, for cxr, urged to quit smoking,  to f/u any worsening symptoms or concerns

## 2011-11-07 NOTE — Assessment & Plan Note (Signed)
stable overall by hx and exam, most recent data reviewed with pt, and pt to continue medical treatment as before  BP Readings from Last 3 Encounters:  11/06/11 132/70  05/08/11 104/60  11/07/10 116/60

## 2011-11-07 NOTE — Assessment & Plan Note (Addendum)
Solitary kidney s/p nephrectomy, renal cell ca, cr grad worse recently,  most recent data reviewed with pt, and pt to continue medical treatment as before, consider renal referral  Lab Results  Component Value Date   CREATININE 2.2* 11/06/2011

## 2011-11-07 NOTE — Progress Notes (Signed)
Subjective:    Patient ID: [name], male    DOB: April 23, 1925, 78 y.o.   MRN: [id number]  HPI  Here with wife for f/u;  Pt denies chest pain, increased sob or doe, wheezing, orthopnea, PND, increased LE swelling, palpitations, dizziness or syncope, though has ongoing cough, nonproductive, long time smoker.  Pt denies new neurological symptoms such as new headache, or facial or extremity weakness or numbness   Pt denies polydipsia, polyuria,   Pt states overall good compliance with meds, trying to follow lower cholesterol, diabetic diet, wt overall stable.   Pt denies fever, wt loss, night sweats, loss of appetite, or other constitutional symptoms  Overall good compliance with treatment, and good medicine tolerability.  Denies worsening depressive symptoms, suicidal ideation, or panic. Past Medical History  Diagnosis Date  . ALLERGIC RHINITIS 09/29/2007  . BACK PAIN 01/31/2009  . BENIGN PROSTATIC HYPERTROPHY 05/12/2007  . CHEST PAIN 01/31/2008  . CHRONIC OBSTRUCTIVE PULMONARY DISEASE, ACUTE EXACERBATION 08/17/2007  . CONJUNCTIVITIS, ALLERGIC, CHRONIC 03/24/2010  . CONSTIPATION 02/19/2010  . COPD 05/12/2007  . GERD 02/23/2010  . GOUT 07/04/2010  . HYPERLIPIDEMIA 05/12/2007  . HYPERTENSION 05/12/2007  . NEPHROLITHIASIS, HX OF 08/17/2007  . OBESITY 05/12/2007  . PEPTIC ULCER DISEASE 05/12/2007  . PERIPHERAL VASCULAR DISEASE 05/12/2007  . Personal history of unspecified circulatory disease 05/12/2007  . PLANTAR FASCIITIS, RIGHT 09/27/2008  . RENAL CELL CANCER 05/12/2007  . RENAL INSUFFICIENCY 08/17/2007  . SKIN LESION 11/07/2010  . VERTIGO 09/29/2007  . WRIST PAIN, RIGHT 02/26/2009  . History of CVA (cerebrovascular accident) 05/03/2011   Past Surgical History  Procedure Date  . Cataract extraction   . Ear surgury   . Nephroureterectomy   . Nephrectomy   . S.p left knee replacement 10/08  . S/p bilat fem pop bypass     reports that he has been smoking.  He does not have any smokeless tobacco history  on file. He reports that he does not drink alcohol or use illicit drugs. family history includes Alcohol abuse in his brother; Cancer in his daughter, mother, and others; and Heart disease in his brother. Allergies  Allergen Reactions  . Sulfonamide Derivatives     REACTION: rash   Current Outpatient Prescriptions on File Prior to Visit  Medication Sig Dispense Refill  . allopurinol (ZYLOPRIM) 100 MG tablet TAKE 1 TABLET BY MOUTH ONCE DAILY  90 tablet  2  . amLODipine (NORVASC) 5 MG tablet Take 1 tablet (5 mg total) by mouth daily.  90 tablet  2  . aspirin 325 MG tablet Take 325 mg by mouth daily.        . calcium-vitamin D (OSCAL WITH D) 500-200 MG-UNIT per tablet Take 1 tablet by mouth daily.        . cetirizine (ZYRTEC) 10 MG tablet Take 10 mg by mouth daily.        . cyclobenzaprine (FLEXERIL) 5 MG tablet Take 5 mg by mouth 3 (three) times daily as needed.        . hydrochlorothiazide (HYDRODIURIL) 25 MG tablet Take 1 tablet (25 mg total) by mouth daily.  90 tablet  3  . indomethacin (INDOCIN) 50 MG capsule Take 50 mg by mouth 3 (three) times daily with meals. For acute gout only       . meclizine (ANTIVERT) 12.5 MG tablet Take 1 tablet (12.5 mg total) by mouth 4 (four) times daily as needed.  180 tablet  2  . omeprazole (PRILOSEC) 20 MG capsule TAKE  ONE CAPSULE BY MOUTH EVERY DAY  30 capsule  6  . polyethylene glycol powder (MIRALAX) powder Take 17 g by mouth daily.        . pravastatin (PRAVACHOL) 40 MG tablet Take 1 tablet (40 mg total) by mouth every evening.  90 tablet  2  . promethazine (PHENERGAN) 25 MG tablet Take 25 mg by mouth every 6 (six) hours as needed.        . tiotropium (SPIRIVA) 18 MCG inhalation capsule Place 18 mcg into inhaler and inhale daily.         Current Facility-Administered Medications on File Prior to Visit  Medication Dose Route Frequency Provider Last Rate Last Dose  . pneumococcal 23 valent vaccine (PNU-IMMUNE) injection 0.5 mL  0.5 mL Intramuscular Once  Corwin Levins, MD        Review of Systems Review of Systems  Constitutional: Negative for diaphoresis and unexpected weight change.  HENT: Negative for drooling and tinnitus.   Eyes: Negative for photophobia and visual disturbance.  Respiratory: Negative for choking and stridor.   Gastrointestinal: Negative for vomiting and blood in stool.  Genitourinary: Negative for hematuria and decreased urine volume.     Objective:   Physical Exam BP 132/70  Pulse 68  Temp(Src) 97.4 F (36.3 C) (Oral)  Ht 5\' 5"  (1.651 m)  Wt 187 lb 6 oz (84.993 kg)  BMI 31.18 kg/m2  SpO2 94% Physical Exam  VS noted Constitutional: Pt appears well-developed and well-nourished.  HENT: Head: Normocephalic.  Right Ear: External ear normal.  Left Ear: External ear normal.  Eyes: Conjunctivae and EOM are normal. Pupils are equal, round, and reactive to light.  Neck: Normal range of motion. Neck supple.  Cardiovascular: Normal rate and regular rhythm.   Pulmonary/Chest: Effort normal and breath sounds normal.  Neurological: Pt is alert. No cranial nerve deficit.  Skin: Skin is warm. No erythema.  Psychiatric: Pt behavior is normal. Thought content normal.     Assessment & Plan:

## 2011-11-07 NOTE — Assessment & Plan Note (Signed)
?   Mild persistent, for re-check, consider heme eval,  to f/u any worsening symptoms or concerns

## 2011-11-16 ENCOUNTER — Telehealth: Payer: Self-pay

## 2011-11-16 ENCOUNTER — Telehealth: Payer: Self-pay | Admitting: Oncology

## 2011-11-16 NOTE — Telephone Encounter (Signed)
Done per emr 

## 2011-11-16 NOTE — Telephone Encounter (Signed)
[name] at [facility] called to inform the patient was seen by Dr. Lacy Duverney and is recommending the patient to stop Indocin.

## 2011-11-16 NOTE — Telephone Encounter (Signed)
S/w the pt's wife and she is aware of the new pt appt with dr Donnie Coffin on 11/18/2011

## 2011-11-18 ENCOUNTER — Other Ambulatory Visit: Payer: Self-pay | Admitting: Oncology

## 2011-11-18 ENCOUNTER — Other Ambulatory Visit (HOSPITAL_BASED_OUTPATIENT_CLINIC_OR_DEPARTMENT_OTHER): Payer: Medicare Other

## 2011-11-18 ENCOUNTER — Telehealth: Payer: Self-pay | Admitting: Oncology

## 2011-11-18 ENCOUNTER — Ambulatory Visit: Payer: Medicare Other

## 2011-11-18 ENCOUNTER — Ambulatory Visit (HOSPITAL_BASED_OUTPATIENT_CLINIC_OR_DEPARTMENT_OTHER): Payer: Medicare Other | Admitting: Oncology

## 2011-11-18 VITALS — BP 145/65 | HR 76 | Temp 97.1°F | Ht 65.5 in | Wt 188.4 lb

## 2011-11-18 DIAGNOSIS — C649 Malignant neoplasm of unspecified kidney, except renal pelvis: Secondary | ICD-10-CM

## 2011-11-18 DIAGNOSIS — D72829 Elevated white blood cell count, unspecified: Secondary | ICD-10-CM

## 2011-11-18 LAB — CBC & DIFF AND RETIC
BASO%: 0.3 % (ref 0.0–2.0)
LYMPH%: 30.5 % (ref 14.0–49.0)
MCHC: 35.5 g/dL (ref 32.0–36.0)
MONO#: 1.2 10*3/uL — ABNORMAL HIGH (ref 0.1–0.9)
Platelets: 284 10*3/uL (ref 140–400)
RBC: 4.6 10*6/uL (ref 4.20–5.82)
RDW: 14 % (ref 11.0–14.6)
Retic %: 1.84 % — ABNORMAL HIGH (ref 0.80–1.80)
Retic Ct Abs: 84.64 10*3/uL (ref 34.80–93.90)
WBC: 10.4 10*3/uL — ABNORMAL HIGH (ref 4.0–10.3)
lymph#: 3.2 10*3/uL (ref 0.9–3.3)

## 2011-11-18 LAB — COMPREHENSIVE METABOLIC PANEL
Albumin: 3.7 g/dL (ref 3.5–5.2)
BUN: 26 mg/dL — ABNORMAL HIGH (ref 6–23)
CO2: 21 mEq/L (ref 19–32)
Calcium: 8.7 mg/dL (ref 8.4–10.5)
Chloride: 106 mEq/L (ref 96–112)
Creatinine, Ser: 2.08 mg/dL — ABNORMAL HIGH (ref 0.50–1.35)
Glucose, Bld: 100 mg/dL — ABNORMAL HIGH (ref 70–99)
Potassium: 4 mEq/L (ref 3.5–5.3)

## 2011-11-18 LAB — MORPHOLOGY

## 2011-11-18 LAB — LACTATE DEHYDROGENASE: LDH: 145 U/L (ref 94–250)

## 2011-11-18 LAB — CHCC SMEAR

## 2011-11-18 NOTE — Progress Notes (Signed)
Referral MD dr Oliver Barre  [phone]    Reason for Referral: Leukocytosis   Chief Complaint  Patient presents with  . New Evaluation  : 4 leukocytosis   HPI: This is a 78 year old gentleman who is referred by Dr. Jonny Ruiz for evaluation of leukocytosis. This gentleman has a fairly lengthy 6 past medical history essentially has been noted to have an elevated white count and is now presented for further evaluation. He has been in his usual state of health. He denies any new symptoms. Specifically denies any fevers or weight changes night sweats or pain. He is known to have the left renal cell carcinoma status post nephrectomy for over 20 years ago. He has a number of other comorbid problems including mild renal insufficiency history peripheral vascular disease history of hyperlipidemia as well as gout and hypertension. Looking back at his medical record it would appear that his white count has been somewhere in attendance 15,000 range about 2008. We'll also appear that most of the time t there does not appear to be any specific white cell abnormalities. His other blood cell parameters have been stable.   Past Medical History  Diagnosis Date  . ALLERGIC RHINITIS 09/29/2007  . BACK PAIN 01/31/2009  . BENIGN PROSTATIC HYPERTROPHY 05/12/2007  . CHEST PAIN 01/31/2008  . CHRONIC OBSTRUCTIVE PULMONARY DISEASE, ACUTE EXACERBATION 08/17/2007  . CONJUNCTIVITIS, ALLERGIC, CHRONIC 03/24/2010  . CONSTIPATION 02/19/2010  . COPD 05/12/2007  . GERD 02/23/2010  . GOUT 07/04/2010  . HYPERLIPIDEMIA 05/12/2007  . HYPERTENSION 05/12/2007  . NEPHROLITHIASIS, HX OF 08/17/2007  . OBESITY 05/12/2007  . PEPTIC ULCER DISEASE 05/12/2007  . PERIPHERAL VASCULAR DISEASE 05/12/2007  . Personal history of unspecified circulatory disease 05/12/2007  . PLANTAR FASCIITIS, RIGHT 09/27/2008  . RENAL CELL CANCER 05/12/2007  . RENAL INSUFFICIENCY 08/17/2007  . SKIN LESION 11/07/2010  . VERTIGO 09/29/2007  . WRIST PAIN, RIGHT 02/26/2009  .  History of CVA (cerebrovascular accident) 05/03/2011  :  Past Surgical History  Procedure Date  . Cataract extraction   . Ear surgury   . Nephroureterectomy   . Nephrectomy   . S.p left knee replacement 10/08  . S/p bilat fem pop bypass   :  Current outpatient prescriptions:allopurinol (ZYLOPRIM) 100 MG tablet, TAKE 1 TABLET BY MOUTH ONCE DAILY, Disp: 90 tablet, Rfl: 2;  amLODipine (NORVASC) 5 MG tablet, Take 1 tablet (5 mg total) by mouth daily., Disp: 90 tablet, Rfl: 2;  aspirin 325 MG tablet, Take 325 mg by mouth daily.  , Disp: , Rfl: ;  calcium-vitamin D (OSCAL WITH D) 500-200 MG-UNIT per tablet, Take 1 tablet by mouth daily.  , Disp: , Rfl:  cetirizine (ZYRTEC) 10 MG tablet, Take 10 mg by mouth daily.  , Disp: , Rfl: ;  cyclobenzaprine (FLEXERIL) 5 MG tablet, Take 5 mg by mouth 3 (three) times daily as needed.  , Disp: , Rfl: ;  diphenhydrAMINE (BENADRYL) 25 mg capsule, Take 25 mg by mouth daily as needed., Disp: , Rfl: ;  hydrochlorothiazide (HYDRODIURIL) 25 MG tablet, Take 1 tablet (25 mg total) by mouth daily., Disp: 90 tablet, Rfl: 3 indomethacin (INDOCIN) 50 MG capsule, Take 50 mg by mouth 3 (three) times daily with meals. For acute gout only, Disp: , Rfl: ;  meclizine (ANTIVERT) 12.5 MG tablet, Take 1 tablet (12.5 mg total) by mouth 4 (four) times daily as needed., Disp: 180 tablet, Rfl: 2;  omeprazole (PRILOSEC) 20 MG capsule, TAKE ONE CAPSULE BY MOUTH EVERY DAY, Disp: 30 capsule, Rfl:  6 polyethylene glycol powder (MIRALAX) powder, Take 17 g by mouth daily as needed. , Disp: , Rfl: ;  pravastatin (PRAVACHOL) 40 MG tablet, Take 1 tablet (40 mg total) by mouth every evening., Disp: 90 tablet, Rfl: 2;  promethazine (PHENERGAN) 25 MG tablet, Take 25 mg by mouth every 6 (six) hours as needed.  , Disp: , Rfl: ;  tiotropium (SPIRIVA) 18 MCG inhalation capsule, Place 18 mcg into inhaler and inhale daily.  , Disp: , Rfl:  Current facility-administered medications:pneumococcal 23 valent vaccine  (PNU-IMMUNE) injection 0.5 mL, 0.5 mL, Intramuscular, Once, Corwin Levins, MD:     . pneumococcal 23 valent vaccine  0.5 mL Intramuscular Once  :  Allergies  Allergen Reactions  . Sulfonamide Derivatives     REACTION: rash  :  Family History  Problem Relation Age of Onset  . Cancer Mother     colon cancer  . Heart disease Brother   . Alcohol abuse Brother   . Cancer Daughter     bladder cancer  . Cancer Other     grandson leukemia and BMT  . Cancer Other     sibling with pancreatic cancer  :  History   Social History  . Marital Status: Married    Spouse Name: N/A    Number of Children: 4, 9 grandchildren  . Years of Education: 5   Occupational History  . retired Counsellor    Social History Main Topics  . Smoking status: Current Everyday Smoker> 41 y  . Smokeless tobacco: Not on file  . Alcohol Use: No  . Drug Use: No  . Sexually Active: Not on file   Other Topics Concern  . Not on file   Social History Narrative   Married for 60 yrs; lived his entire life in [place] area.he is a retired [employer] War II veteran who saw active combat in [place] .   :  A comprehensive review of systems was negative.he is notably hard of hearing he does wear hearing on the right side left ear is essentially nonfunctional.  His weight has been stable, once again he does not complain of night sweats fevers. He does have some dyspnea on exertion but denies any cough        Exam:  General appearance: alert, cooperative and appears stated age Eyes: conjunctivae/corneas clear. PERRL, EOM's intact. Fundi benign. Throat: lips, mucosa, and tongue normal; teeth and gums normal Resp: clear to auscultation bilaterally Cardio: regular rate and rhythm, S1, S2 normal, no murmur, click, rub or gallop and normal apical impulse GI: soft, non-tender; bowel sounds normal; no masses,  no organomegaly Extremities: extremities normal, atraumatic, no cyanosis or edema Pulses: 2+ and  symmetric Lymph nodes: Cervical, supraclavicular, and axillary nodes normal. Neurologic: Grossly normal   Basename 11/18/11 0951  WBC 10.4*  HGB 14.8  HCT 41.7  PLT 284    Basename 11/18/11 0943  NA 140  K 4.0  CL 106  CO2 21  GLUCOSE 100*  BUN 26*  CREATININE 2.08*  CALCIUM 8.7    Blood smear review: There are no abnormal white cell forms that I can appreciate. RBC morphology appears to be normal  Pathology:n/a  Dg Chest 2 View  11/06/2011  *RADIOLOGY REPORT*  Clinical Data: Cough.  History of renal cell cancer.  CHEST - 2 VIEW  Comparison: 06/09/2007  Findings: Two views of the chest were obtained.  The lungs are clear bilaterally.  Multilevel degenerative changes in the lower thoracic spine.  Heart  and mediastinum are within normal limits. Calcifications of the aortic arch.  No focal airspace disease. Old left fifth rib fracture.  IMPRESSION: No acute cardiopulmonary disease.  Original Report Authenticated By: Richarda Overlie, M.D.    Assessment and Plan: 78 year old gentleman with history of leukocytosis it appears to extend back a number of years. His white cell count has been up to a size 15,000 have today's count is down 10,000. It is unable to be any significance to this elevated white count and this may appear to be a variant of normal. I will plan a plan to bring the patient back in a few months to repeat this. I do not suspect that this represents a lymphoproliferative process.   Pierce Crane M.D. FRCP C.   45 minutes was spent with the patient half the time and patient-related count

## 2011-11-18 NOTE — Telephone Encounter (Signed)
gve the pt his June 2013 appt calendar °

## 2011-11-18 NOTE — Telephone Encounter (Signed)
Referred by Dr/

## 2011-11-18 NOTE — Telephone Encounter (Signed)
Referred by Dr. Oliver Barre Dx- Leukocytosis

## 2011-11-30 ENCOUNTER — Other Ambulatory Visit: Payer: Self-pay

## 2011-11-30 MED ORDER — OMEPRAZOLE 20 MG PO CPDR: 20.0000 mg | DELAYED_RELEASE_CAPSULE | Freq: Every day | ORAL | Status: DC

## 2012-02-19 ENCOUNTER — Ambulatory Visit (HOSPITAL_BASED_OUTPATIENT_CLINIC_OR_DEPARTMENT_OTHER): Payer: Medicare Other | Admitting: Oncology

## 2012-02-19 ENCOUNTER — Other Ambulatory Visit (HOSPITAL_BASED_OUTPATIENT_CLINIC_OR_DEPARTMENT_OTHER): Payer: Medicare Other | Admitting: Lab

## 2012-02-19 VITALS — BP 154/67 | HR 68 | Temp 97.0°F | Ht 65.5 in | Wt 191.1 lb

## 2012-02-19 DIAGNOSIS — Z09 Encounter for follow-up examination after completed treatment for conditions other than malignant neoplasm: Secondary | ICD-10-CM

## 2012-02-19 DIAGNOSIS — C649 Malignant neoplasm of unspecified kidney, except renal pelvis: Secondary | ICD-10-CM

## 2012-02-19 DIAGNOSIS — D72829 Elevated white blood cell count, unspecified: Secondary | ICD-10-CM

## 2012-02-19 LAB — CBC & DIFF AND RETIC
BASO%: 0.3 % (ref 0.0–2.0)
EOS%: 2.8 % (ref 0.0–7.0)
MCH: 32 pg (ref 27.2–33.4)
MCHC: 34.6 g/dL (ref 32.0–36.0)
MONO#: 1.1 10*3/uL — ABNORMAL HIGH (ref 0.1–0.9)
RBC: 4.28 10*6/uL (ref 4.20–5.82)
RDW: 13.8 % (ref 11.0–14.6)
Retic %: 1.67 % (ref 0.80–1.80)
Retic Ct Abs: 71.48 10*3/uL (ref 34.80–93.90)
WBC: 9.7 10*3/uL (ref 4.0–10.3)
lymph#: 3.6 10*3/uL — ABNORMAL HIGH (ref 0.9–3.3)
nRBC: 0 % (ref 0–0)

## 2012-02-19 LAB — MORPHOLOGY: PLT EST: ADEQUATE

## 2012-02-19 LAB — CHCC SMEAR

## 2012-02-19 NOTE — Progress Notes (Signed)
Basic Metabolic Panel:No results found for this basename: INR:5,PROTIME:5 in the last 168 hours  CBC:  Lab 02/19/12 1103  WBC 9.7  NEUTROABS 4.7  HGB 13.7  HCT 39.6  MCV 92.5  PLT 266   Cardiac Enzymes: No results found for this basename: CKTOTAL:5,CKMB:5,CKMBINDEX:5,TROPONINI:5 in the last 168 hours BNP: No components found with this basename: POCBNP:5 CBG: No results found for this basename: GLUCAP:5 in the last 168 hours D-Dimer No results found for this basename: DDIMER:2 in the last 72 hours Hgb A1c No results found for this basename: HGBA1C:2 in the last 72 hours Lipid Profile No results found for this basename: CHOL:2,HDL:2,LDLCALC:2,TRIG:2,CHOLHDL:2,LDLDIRECT:2 in the last 72 hours Thyroid function studies No results found for this basename: TSH,T4TOTAL,FREET3,T3FREE,THYROIDAB in the last 72 hours Anemia work up No results found for this basename: VITAMINB12:2,FOLATE:2,FERRITIN:2,TIBC:2,IRON:2,RETICCTPCT:2 in the last 72 hours Microbiology No results found for this or any previous visit (from the past 240 hour(s)).    [name]  is here today, his CBC today is normal essentially with a white count of 9.7 and a normal differential as well as smear. I think this is a normal variant for him. Do not think this requires followup. I recommended he followup with his primary care doctor and let us know if we need to see him again. I have not scheduled him for followup.  Pierce Crane M.D. FRCP C.

## 2012-04-27 ENCOUNTER — Other Ambulatory Visit: Payer: Self-pay | Admitting: Internal Medicine

## 2012-05-04 ENCOUNTER — Other Ambulatory Visit: Payer: Self-pay

## 2012-05-04 MED ORDER — HYDROCHLOROTHIAZIDE 25 MG PO TABS: 25.0000 mg | ORAL_TABLET | Freq: Every day | ORAL | Status: DC

## 2012-05-04 MED ORDER — MECLIZINE HCL 12.5 MG PO TABS: 12.5000 mg | ORAL_TABLET | Freq: Four times a day (QID) | ORAL | Status: DC | PRN

## 2012-05-06 ENCOUNTER — Encounter: Payer: Self-pay | Admitting: Internal Medicine

## 2012-05-06 ENCOUNTER — Other Ambulatory Visit (INDEPENDENT_AMBULATORY_CARE_PROVIDER_SITE_OTHER): Payer: Medicare Other

## 2012-05-06 ENCOUNTER — Ambulatory Visit (INDEPENDENT_AMBULATORY_CARE_PROVIDER_SITE_OTHER): Payer: Medicare Other | Admitting: Internal Medicine

## 2012-05-06 VITALS — BP 132/64 | HR 70 | Temp 97.8°F | Resp 16 | Wt 184.1 lb

## 2012-05-06 DIAGNOSIS — I10 Essential (primary) hypertension: Secondary | ICD-10-CM

## 2012-05-06 DIAGNOSIS — J449 Chronic obstructive pulmonary disease, unspecified: Secondary | ICD-10-CM

## 2012-05-06 DIAGNOSIS — E785 Hyperlipidemia, unspecified: Secondary | ICD-10-CM

## 2012-05-06 DIAGNOSIS — Z0000 Encounter for general adult medical examination without abnormal findings: Secondary | ICD-10-CM

## 2012-05-06 DIAGNOSIS — K219 Gastro-esophageal reflux disease without esophagitis: Secondary | ICD-10-CM

## 2012-05-06 DIAGNOSIS — Z23 Encounter for immunization: Secondary | ICD-10-CM

## 2012-05-06 DIAGNOSIS — Z79899 Other long term (current) drug therapy: Secondary | ICD-10-CM

## 2012-05-06 LAB — URINALYSIS, ROUTINE W REFLEX MICROSCOPIC
Bilirubin Urine: NEGATIVE
Ketones, ur: NEGATIVE
Nitrite: NEGATIVE
Total Protein, Urine: NEGATIVE
Urine Glucose: NEGATIVE
pH: 6 (ref 5.0–8.0)

## 2012-05-06 NOTE — Assessment & Plan Note (Addendum)
ECG reviewed as per emr, stable overall by hx and exam, most recent data reviewed with pt, and pt to continue medical treatment as before BP Readings from Last 3 Encounters:  05/06/12 132/64  02/19/12 154/67  11/18/11 145/65

## 2012-05-06 NOTE — Assessment & Plan Note (Signed)
stable overall by hx and exam, most recent data reviewed with pt, and pt to continue medical treatment as before Lab Results  Component Value Date   LDLCALC 64 05/08/2011

## 2012-05-06 NOTE — Assessment & Plan Note (Signed)
stable overall by hx and exam, most recent data reviewed with pt, and pt to continue medical treatment as before Lab Results  Component Value Date   WBC 9.7 02/19/2012   HGB 13.7 02/19/2012   HCT 39.6 02/19/2012   PLT 266 02/19/2012   GLUCOSE 100* 11/18/2011   CHOL 128 05/08/2011   TRIG 123.0 05/08/2011   HDL 39.30 05/08/2011   LDLCALC 64 05/08/2011   ALT 11 11/18/2011   AST 14 11/18/2011   NA 140 11/18/2011   K 4.0 11/18/2011   CL 106 11/18/2011   CREATININE 2.08* 11/18/2011   BUN 26* 11/18/2011   CO2 21 11/18/2011   TSH 1.32 05/08/2011   PSA 0.94 03/18/2010   INR 2.8* 10/28/2007

## 2012-05-06 NOTE — Assessment & Plan Note (Signed)
stable overall by hx and exam, most recent data reviewed with pt, and pt to continue medical treatment as before SpO2 Readings from Last 3 Encounters:  05/06/12 95%  11/06/11 94%  05/08/11 95%

## 2012-05-06 NOTE — Progress Notes (Signed)
Subjective:    Patient ID: [name], male    DOB: October 03, 1924, 78 y.o.   MRN: [id number]  HPI  Here with wife to f/u; overall doing ok, does have 2 yrs chronic right eye dryness and watering per Dr Nile Riggs with f/u appt soon.  Pt denies chest pain, increased sob or doe, wheezing, orthopnea, PND, increased LE swelling, palpitations, dizziness or syncope.   Pt denies polydipsia, polyuria,  Pt states overall good compliance with meds, trying to follow lower cholesterol diet, wt overall stable but little exercise however, getting a bit slower in his advanced age.  Pt denies new neurological symptoms such as new headache, or facial or extremity weakness or numbness   Pt denies fever, wt loss, night sweats, loss of appetite, or other constitutional symptoms. Denies worsening depressive symptoms, suicidal ideation, or panic. No other new complaints. Denies worsening reflux, dysphagia, abd pain, n/v, bowel change or blood.  Past Medical History  Diagnosis Date  . ALLERGIC RHINITIS 09/29/2007  . BACK PAIN 01/31/2009  . BENIGN PROSTATIC HYPERTROPHY 05/12/2007  . CHEST PAIN 01/31/2008  . CHRONIC OBSTRUCTIVE PULMONARY DISEASE, ACUTE EXACERBATION 08/17/2007  . CONJUNCTIVITIS, ALLERGIC, CHRONIC 03/24/2010  . CONSTIPATION 02/19/2010  . COPD 05/12/2007  . GERD 02/23/2010  . GOUT 07/04/2010  . HYPERLIPIDEMIA 05/12/2007  . HYPERTENSION 05/12/2007  . NEPHROLITHIASIS, HX OF 08/17/2007  . OBESITY 05/12/2007  . PEPTIC ULCER DISEASE 05/12/2007  . PERIPHERAL VASCULAR DISEASE 05/12/2007  . Personal history of unspecified circulatory disease 05/12/2007  . PLANTAR FASCIITIS, RIGHT 09/27/2008  . RENAL CELL CANCER 05/12/2007  . RENAL INSUFFICIENCY 08/17/2007  . SKIN LESION 11/07/2010  . VERTIGO 09/29/2007  . WRIST PAIN, RIGHT 02/26/2009  . History of CVA (cerebrovascular accident) 05/03/2011   Past Surgical History  Procedure Date  . Cataract extraction   . Ear surgury   . Nephroureterectomy   . Nephrectomy   . S.p left knee  replacement 10/08  . S/p bilat fem pop bypass     reports that he has been smoking.  He does not have any smokeless tobacco history on file. He reports that he does not drink alcohol or use illicit drugs. family history includes Alcohol abuse in his brother; Cancer in his daughter, mother, and others; and Heart disease in his brother. Allergies  Allergen Reactions  . Sulfonamide Derivatives     REACTION: rash   Current Outpatient Prescriptions on File Prior to Visit  Medication Sig Dispense Refill  . allopurinol (ZYLOPRIM) 100 MG tablet TAKE ONE TABLET BY MOUTH EVERY DAY  90 tablet  1  . amLODipine (NORVASC) 5 MG tablet Take 1 tablet (5 mg total) by mouth daily.  90 tablet  2  . aspirin 325 MG tablet Take 325 mg by mouth daily.        . calcium-vitamin D (OSCAL WITH D) 500-200 MG-UNIT per tablet Take 1 tablet by mouth daily.        . cetirizine (ZYRTEC) 10 MG tablet Take 10 mg by mouth daily as needed.       . cyclobenzaprine (FLEXERIL) 5 MG tablet Take 5 mg by mouth 3 (three) times daily as needed.        . diphenhydrAMINE (BENADRYL) 25 mg capsule Take 25 mg by mouth daily as needed.      . hydrochlorothiazide (HYDRODIURIL) 25 MG tablet Take 1 tablet (25 mg total) by mouth daily.  90 tablet  1  . indomethacin (INDOCIN) 50 MG capsule Take 50 mg by mouth 3 (three)  times daily with meals. For acute gout only      . meclizine (ANTIVERT) 12.5 MG tablet Take 1 tablet (12.5 mg total) by mouth 4 (four) times daily as needed.  180 tablet  0  . omeprazole (PRILOSEC) 20 MG capsule Take 1 capsule (20 mg total) by mouth daily.  90 capsule  3  . polyethylene glycol powder (MIRALAX) powder Take 17 g by mouth daily as needed.       . pravastatin (PRAVACHOL) 40 MG tablet Take 1 tablet (40 mg total) by mouth every evening.  90 tablet  2  . promethazine (PHENERGAN) 25 MG tablet Take 25 mg by mouth every 6 (six) hours as needed.        . tiotropium (SPIRIVA) 18 MCG inhalation capsule Place 18 mcg into inhaler  and inhale daily as needed.        Current Facility-Administered Medications on File Prior to Visit  Medication Dose Route Frequency Provider Last Rate Last Dose  . pneumococcal 23 valent vaccine (PNU-IMMUNE) injection 0.5 mL  0.5 mL Intramuscular Once Corwin Levins, MD       Review of Systems Review of Systems  Constitutional: Negative for diaphoresis and unexpected weight change.  HENT: Negative for tinnitus.   Eyes: Negative for photophobia and visual disturbance.  Respiratory: Negative for choking and stridor.   Gastrointestinal: Negative for vomiting and blood in stool.  Genitourinary: Negative for hematuria and decreased urine volume.  Musculoskeletal: Negative for gait problem. Except has noted some "off balance" a bit more lately, no falls, does not hold on, has cane for stability Skin: Negative for color change and wound.  Neurological: Negative for tremors and numbness.  Psychiatric/Behavioral: Negative for decreased concentration. The patient is not hyperactive.      Objective:   Physical Exam BP 132/64  Pulse 70  Temp 97.8 F (36.6 C) (Oral)  Resp 16  Wt 184 lb 2 oz (83.519 kg)  SpO2 95% Physical Exam  VS noted Constitutional: Pt appears well-developed and well-nourished.  HENT: Head: Normocephalic.  Right Ear: External ear normal.  Left Ear: External ear normal.  Eyes: Conjunctivae and EOM are normal. Pupils are equal, round, and reactive to light.  Neck: Normal range of motion. Neck supple.  Cardiovascular: Normal rate and regular rhythm.   Pulmonary/Chest: Effort normal and breath sounds normal.  Abd:  Soft, NT, non-distended, + BS Neurological: Pt is alert. No cranial nerve deficit. motor/dtr/gait intact Skin: Skin is warm. No erythema. No rash or concerning skin lesion to torso or extremities Psychiatric: Pt behavior is normal. Thought content normal. for age    Assessment & Plan:

## 2012-05-06 NOTE — Patient Instructions (Addendum)
You had the flu shot today Please go to LAB in the Basement for the blood and/or urine tests to be done today You will be contacted by phone if any changes need to be made immediately.  Otherwise, you will receive a letter about your results with an explanation. Your EKG was ok today Please return in 6 months, or sooner if needed

## 2012-05-09 ENCOUNTER — Encounter: Payer: Self-pay | Admitting: Internal Medicine

## 2012-05-09 LAB — BASIC METABOLIC PANEL
CO2: 24 mEq/L (ref 19–32)
Chloride: 102 mEq/L (ref 96–112)
Potassium: 3.8 mEq/L (ref 3.5–5.1)

## 2012-05-09 LAB — HEPATIC FUNCTION PANEL
ALT: 11 U/L (ref 0–53)
AST: 20 U/L (ref 0–37)
Alkaline Phosphatase: 85 U/L (ref 39–117)
Bilirubin, Direct: 0.1 mg/dL (ref 0.0–0.3)
Total Bilirubin: 1.1 mg/dL (ref 0.3–1.2)
Total Protein: 7.2 g/dL (ref 6.0–8.3)

## 2012-05-09 LAB — LIPID PANEL
LDL Cholesterol: 62 mg/dL (ref 0–99)
Total CHOL/HDL Ratio: 3

## 2012-10-20 ENCOUNTER — Telehealth: Payer: Self-pay | Admitting: Internal Medicine

## 2012-10-20 MED ORDER — AMLODIPINE BESYLATE 5 MG PO TABS: 5.0000 mg | ORAL_TABLET | Freq: Every day | ORAL | Status: DC

## 2012-10-20 NOTE — Telephone Encounter (Signed)
Sent in prescription as requested and call the patients wife to inform.

## 2012-10-20 NOTE — Telephone Encounter (Signed)
Wife/Nancy calling because Aking needs a refill of Amlodipine.  States she would like to get a 90 day supply sent to [facility] [phone].  No longer wishes to use [facility].  Next appt is on 11/04/12.  Please f/u with Harriett Sine regarding script refill request.

## 2012-11-04 ENCOUNTER — Ambulatory Visit: Payer: Medicare Other | Admitting: Internal Medicine

## 2012-11-14 ENCOUNTER — Encounter: Payer: Self-pay | Admitting: Internal Medicine

## 2012-11-14 ENCOUNTER — Other Ambulatory Visit: Payer: Self-pay | Admitting: Internal Medicine

## 2012-11-14 ENCOUNTER — Ambulatory Visit (INDEPENDENT_AMBULATORY_CARE_PROVIDER_SITE_OTHER): Payer: Medicare Other | Admitting: Internal Medicine

## 2012-11-14 ENCOUNTER — Ambulatory Visit (INDEPENDENT_AMBULATORY_CARE_PROVIDER_SITE_OTHER)
Admission: RE | Admit: 2012-11-14 | Discharge: 2012-11-14 | Disposition: A | Discharge status: Home / Self Care | Payer: Medicare Other | Source: Ambulatory Visit | Attending: Internal Medicine | Admitting: Internal Medicine

## 2012-11-14 VITALS — BP 122/62 | HR 74 | Temp 97.4°F | Ht 65.0 in | Wt 188.2 lb

## 2012-11-14 DIAGNOSIS — F17200 Nicotine dependence, unspecified, uncomplicated: Secondary | ICD-10-CM

## 2012-11-14 DIAGNOSIS — N189 Chronic kidney disease, unspecified: Secondary | ICD-10-CM

## 2012-11-14 MED ORDER — MECLIZINE HCL 12.5 MG PO TABS: 12.5000 mg | ORAL_TABLET | Freq: Four times a day (QID) | ORAL | Status: DC | PRN

## 2012-11-14 MED ORDER — ALLOPURINOL 100 MG PO TABS: 100.0000 mg | ORAL_TABLET | Freq: Every day | ORAL | Status: DC

## 2012-11-14 MED ORDER — PRAVASTATIN SODIUM 40 MG PO TABS: 40.0000 mg | ORAL_TABLET | Freq: Every evening | ORAL | Status: DC

## 2012-11-14 MED ORDER — HYDROCHLOROTHIAZIDE 25 MG PO TABS: 25.0000 mg | ORAL_TABLET | Freq: Every day | ORAL | Status: DC

## 2012-11-14 MED ORDER — OMEPRAZOLE 20 MG PO CPDR: 20.0000 mg | DELAYED_RELEASE_CAPSULE | Freq: Every day | ORAL | Status: DC

## 2012-11-14 NOTE — Assessment & Plan Note (Signed)
Has planned f/u with renal soon, volume stable,  to f/u any worsening symptoms or concerns

## 2012-11-14 NOTE — Patient Instructions (Addendum)
Please continue all other medications as before, and refills have been done if requested. Please have the pharmacy call with any other refills you may need. Please keep your appointments with your specialists as you have planned  - nephrology as you do Please go to the XRAY Department in the Basement (go straight as you get off the elevator) for the x-ray testing You will be contacted by phone if any changes need to be made immediately.  Otherwise, you will receive a letter about your results with an explanation You will be contacted regarding the referral for: echocardiogram Please remember to sign up for My Chart if you have not done so, as this will be important to you in the future with finding out test results, communicating by private email, and scheduling acute appointments online when needed. Please return in 6 months, or sooner if needed

## 2012-11-14 NOTE — Assessment & Plan Note (Signed)
Isolated right side, for cxr today, encouraged to quit smoking

## 2012-11-14 NOTE — Assessment & Plan Note (Signed)
New onset, for echo - ? AS,  to f/u any worsening symptoms or concerns

## 2012-11-14 NOTE — Progress Notes (Signed)
Subjective:    Patient ID: [name], male    DOB: 08/09/1925, 79 y.o.   MRN: [id number]  HPI  Here to f/u; overall doing ok,  Pt denies chest pain, increased sob or doe, wheezing, orthopnea, PND, increased LE swelling, palpitations, dizziness or syncope.  Pt denies polydipsia, polyuria, or low sugar symptoms such as weakness or confusion improved with po intake.  Pt denies new neurological symptoms such as new headache, or facial or extremity weakness or numbness.   Pt states overall good compliance with meds, has been trying to follow lower cholesterol diet, with wt overall stable,  but little exercise however.   Had what sounds like uri/cough 6 wks ago, coug finally resolving but still mild persitent.  Past Medical History  Diagnosis Date  . ALLERGIC RHINITIS 09/29/2007  . BACK PAIN 01/31/2009  . BENIGN PROSTATIC HYPERTROPHY 05/12/2007  . CHEST PAIN 01/31/2008  . CHRONIC OBSTRUCTIVE PULMONARY DISEASE, ACUTE EXACERBATION 08/17/2007  . CONJUNCTIVITIS, ALLERGIC, CHRONIC 03/24/2010  . CONSTIPATION 02/19/2010  . COPD 05/12/2007  . GERD 02/23/2010  . GOUT 07/04/2010  . HYPERLIPIDEMIA 05/12/2007  . HYPERTENSION 05/12/2007  . NEPHROLITHIASIS, HX OF 08/17/2007  . OBESITY 05/12/2007  . PEPTIC ULCER DISEASE 05/12/2007  . PERIPHERAL VASCULAR DISEASE 05/12/2007  . Personal history of unspecified circulatory disease 05/12/2007  . PLANTAR FASCIITIS, RIGHT 09/27/2008  . RENAL CELL CANCER 05/12/2007  . RENAL INSUFFICIENCY 08/17/2007  . SKIN LESION 11/07/2010  . VERTIGO 09/29/2007  . WRIST PAIN, RIGHT 02/26/2009  . History of CVA (cerebrovascular accident) 05/03/2011   Past Surgical History  Procedure Laterality Date  . Cataract extraction    . Ear surgury    . Nephroureterectomy    . Nephrectomy    . S.p left knee replacement  10/08  . S/p bilat fem pop bypass      reports that he has been smoking.  He does not have any smokeless tobacco history on file. He reports that he does not drink alcohol or use  illicit drugs. family history includes Alcohol abuse in his brother; Cancer in his daughter, mother, and others; and Heart disease in his brother. Allergies  Allergen Reactions  . Sulfonamide Derivatives     REACTION: rash   Current Outpatient Prescriptions on File Prior to Visit  Medication Sig Dispense Refill  . amLODipine (NORVASC) 5 MG tablet Take 1 tablet (5 mg total) by mouth daily.  90 tablet  2  . aspirin 325 MG tablet Take 325 mg by mouth daily.        . calcium-vitamin D (OSCAL WITH D) 500-200 MG-UNIT per tablet Take 1 tablet by mouth daily.        . cetirizine (ZYRTEC) 10 MG tablet Take 10 mg by mouth daily as needed.       . cyclobenzaprine (FLEXERIL) 5 MG tablet Take 5 mg by mouth 3 (three) times daily as needed.        . diphenhydrAMINE (BENADRYL) 25 mg capsule Take 25 mg by mouth daily as needed.      . indomethacin (INDOCIN) 50 MG capsule Take 50 mg by mouth 3 (three) times daily with meals. For acute gout only      . polyethylene glycol powder (MIRALAX) powder Take 17 g by mouth daily as needed.       . promethazine (PHENERGAN) 25 MG tablet Take 25 mg by mouth every 6 (six) hours as needed.        . tiotropium (SPIRIVA) 18 MCG inhalation capsule Place  18 mcg into inhaler and inhale daily as needed.        Current Facility-Administered Medications on File Prior to Visit  Medication Dose Route Frequency Provider Last Rate Last Dose  . pneumococcal 23 valent vaccine (PNU-IMMUNE) injection 0.5 mL  0.5 mL Intramuscular Once Corwin Levins, MD       Review of Systems  Constitutional: Negative for unexpected weight change, or unusual diaphoresis  HENT: Negative for tinnitus.   Eyes: Negative for photophobia and visual disturbance.  Respiratory: Negative for choking and stridor.   Gastrointestinal: Negative for vomiting and blood in stool.  Genitourinary: Negative for hematuria and decreased urine volume.  Musculoskeletal: Negative for acute joint swelling Skin: Negative for  color change and wound.  Neurological: Negative for tremors and numbness other than noted  Psychiatric/Behavioral: Negative for decreased concentration or  hyperactivity.       Objective:   Physical Exam BP 122/62  Pulse 74  Temp(Src) 97.4 F (36.3 C) (Oral)  Ht 5\' 5"  (1.651 m)  Wt 188 lb 4 oz (85.39 kg)  BMI 31.33 kg/m2  SpO2 96% VS noted,  Constitutional: Pt appears well-developed and well-nourished.  HENT: Head: NCAT.  Right Ear: External ear normal.  Left Ear: External ear normal.  Eyes: Conjunctivae and EOM are normal. Pupils are equal, round, and reactive to light.  Neck: Normal range of motion. Neck supple.  Cardiovascular: Normal rate and regular rhythm. With gr 2-3/6 murmur LUSB  Pulmonary/Chest: Effort normal and breath sounds normal. except for persistent wheeze RLL Abd:  Soft, NT, non-distended, + BS Neurological: Pt is alert. Not confused , some HOH Skin: Skin is warm. No erythema.  Psychiatric: Pt behavior is normal. Thought content normal.     Assessment & Plan:

## 2012-11-14 NOTE — Assessment & Plan Note (Signed)
stable overall by history and exam, recent data reviewed with pt, and pt to continue medical treatment as before,  to f/u any worsening symptoms or concerns BP Readings from Last 3 Encounters:  11/14/12 122/62  05/06/12 132/64  02/19/12 154/67

## 2012-11-14 NOTE — Assessment & Plan Note (Signed)
Urged to quit 

## 2012-12-28 ENCOUNTER — Ambulatory Visit (INDEPENDENT_AMBULATORY_CARE_PROVIDER_SITE_OTHER): Payer: Medicare Other | Admitting: Internal Medicine

## 2012-12-28 ENCOUNTER — Encounter: Payer: Self-pay | Admitting: Internal Medicine

## 2012-12-28 VITALS — BP 134/54 | HR 66 | Temp 97.6°F | Ht 65.0 in | Wt 187.0 lb

## 2012-12-28 DIAGNOSIS — IMO0002 Reserved for concepts with insufficient information to code with codable children: Secondary | ICD-10-CM

## 2012-12-28 DIAGNOSIS — Z0000 Encounter for general adult medical examination without abnormal findings: Secondary | ICD-10-CM

## 2012-12-28 DIAGNOSIS — I10 Essential (primary) hypertension: Secondary | ICD-10-CM

## 2012-12-28 DIAGNOSIS — J309 Allergic rhinitis, unspecified: Secondary | ICD-10-CM

## 2012-12-28 DIAGNOSIS — M5416 Radiculopathy, lumbar region: Secondary | ICD-10-CM | POA: Insufficient documentation

## 2012-12-28 DIAGNOSIS — J449 Chronic obstructive pulmonary disease, unspecified: Secondary | ICD-10-CM

## 2012-12-28 MED ORDER — METHYLPREDNISOLONE ACETATE 80 MG/ML IJ SUSP
80.0000 mg | Freq: Once | INTRAMUSCULAR | Status: AC
  Administered 2012-12-28: 80 mg via INTRAMUSCULAR

## 2012-12-28 MED ORDER — FLUTICASONE PROPIONATE 50 MCG/ACT NA SUSP: 2.0000 | Freq: Every day | NASAL | Status: DC

## 2012-12-28 NOTE — Assessment & Plan Note (Signed)
stable overall by history and exam, recent data reviewed with pt, and pt to continue medical treatment as before,  to f/u any worsening symptoms or concerns SpO2 Readings from Last 3 Encounters:  12/28/12 97%  11/14/12 96%  05/06/12 95%

## 2012-12-28 NOTE — Assessment & Plan Note (Signed)
?   Facet vs other, June 2011 films reviewed, for MRI today as has been ongoing > 4 wks - r/o tumor, cyst, facet, disc dz with nerve impingement

## 2012-12-28 NOTE — Assessment & Plan Note (Signed)
Mild to mod, for depomedrol IM, cont claritin, add flonase asd,,  to f/u any worsening symptoms or concerns

## 2012-12-28 NOTE — Patient Instructions (Addendum)
You had the steroid shot today Please take all new medication as prescribed - the flonase for the congestion symptoms Please continue all other medications as before, including the claritin OTC, and the tylenol arthritis - 1 -2 tabs every 8 hrs as needed You will be contacted regarding the referral for: MRI for lower back Please call if you change your mind about the orthopedic referral Please remember to sign up for My Chart if you have not done so, as this will be important to you in the future with finding out test results, communicating by private email, and scheduling acute appointments online when needed. Please return in 6 months, or sooner if needed, with Lab testing done 3-5 days before

## 2012-12-28 NOTE — Assessment & Plan Note (Signed)
stable overall by history and exam, recent data reviewed with pt, and pt to continue medical treatment as before,  to f/u any worsening symptoms or concerns BP Readings from Last 3 Encounters:  12/28/12 134/54  11/14/12 122/62  05/06/12 132/64

## 2012-12-28 NOTE — Progress Notes (Signed)
Subjective:    Patient ID: [name], male    DOB: 02-Jul-1925, 79 y.o.   MRN: [id number]    HPI  Here with wife for "hip pain" but Pt has mild to mod recurring right LBP with radiation to the buttocks for > 4 wks,but no bowel or bladder change, fever, wt loss,  Other worsening LE pain/numbness/weakness, gait change or fall; worse to first getting gp in the AM with stiffness, better later in the day.  Not worse with ambulation.  Does have several wks ongoing nasal allergy symptoms with clearish congestion, itch and sneezing, without fever, pain, ST, cough, swelling or wheezing.  Pt denies chest pain, increased sob or doe, wheezing, orthopnea, PND, increased LE swelling, palpitations, dizziness or syncope. Pt denies new neurological symptoms such as new headache. Past Medical History  Diagnosis Date  . ALLERGIC RHINITIS 09/29/2007  . BACK PAIN 01/31/2009  . BENIGN PROSTATIC HYPERTROPHY 05/12/2007  . CHEST PAIN 01/31/2008  . CHRONIC OBSTRUCTIVE PULMONARY DISEASE, ACUTE EXACERBATION 08/17/2007  . CONJUNCTIVITIS, ALLERGIC, CHRONIC 03/24/2010  . CONSTIPATION 02/19/2010  . COPD 05/12/2007  . GERD 02/23/2010  . GOUT 07/04/2010  . HYPERLIPIDEMIA 05/12/2007  . HYPERTENSION 05/12/2007  . NEPHROLITHIASIS, HX OF 08/17/2007  . OBESITY 05/12/2007  . PEPTIC ULCER DISEASE 05/12/2007  . PERIPHERAL VASCULAR DISEASE 05/12/2007  . Personal history of unspecified circulatory disease 05/12/2007  . PLANTAR FASCIITIS, RIGHT 09/27/2008  . RENAL CELL CANCER 05/12/2007  . RENAL INSUFFICIENCY 08/17/2007  . SKIN LESION 11/07/2010  . VERTIGO 09/29/2007  . WRIST PAIN, RIGHT 02/26/2009  . History of CVA (cerebrovascular accident) 05/03/2011   Past Surgical History  Procedure Laterality Date  . Cataract extraction    . Ear surgury    . Nephroureterectomy    . Nephrectomy    . S.p left knee replacement  10/08  . S/p bilat fem pop bypass      reports that he has been smoking.  He does not have any smokeless tobacco history on  file. He reports that he does not drink alcohol or use illicit drugs. family history includes Alcohol abuse in his brother; Cancer in his daughter, mother, and others; and Heart disease in his brother. Allergies  Allergen Reactions  . Sulfonamide Derivatives     REACTION: rash   Current Outpatient Prescriptions on File Prior to Visit  Medication Sig Dispense Refill  . allopurinol (ZYLOPRIM) 100 MG tablet Take 1 tablet (100 mg total) by mouth daily.  90 tablet  3  . amLODipine (NORVASC) 5 MG tablet Take 1 tablet (5 mg total) by mouth daily.  90 tablet  2  . aspirin 325 MG tablet Take 325 mg by mouth daily.        . calcium-vitamin D (OSCAL WITH D) 500-200 MG-UNIT per tablet Take 1 tablet by mouth daily.        . cetirizine (ZYRTEC) 10 MG tablet Take 10 mg by mouth daily as needed.       . cyclobenzaprine (FLEXERIL) 5 MG tablet Take 5 mg by mouth 3 (three) times daily as needed.        . diphenhydrAMINE (BENADRYL) 25 mg capsule Take 25 mg by mouth daily as needed.      . hydrochlorothiazide (HYDRODIURIL) 25 MG tablet Take 1 tablet (25 mg total) by mouth daily.  90 tablet  3  . indomethacin (INDOCIN) 50 MG capsule Take 50 mg by mouth 3 (three) times daily with meals. For acute gout only      .  meclizine (ANTIVERT) 12.5 MG tablet Take 1 tablet (12.5 mg total) by mouth 4 (four) times daily as needed.  180 tablet  0  . omeprazole (PRILOSEC) 20 MG capsule Take 1 capsule (20 mg total) by mouth daily.  90 capsule  3  . polyethylene glycol powder (MIRALAX) powder Take 17 g by mouth daily as needed.       . pravastatin (PRAVACHOL) 40 MG tablet Take 1 tablet (40 mg total) by mouth every evening.  90 tablet  3  . promethazine (PHENERGAN) 25 MG tablet Take 25 mg by mouth every 6 (six) hours as needed.        . tiotropium (SPIRIVA) 18 MCG inhalation capsule Place 18 mcg into inhaler and inhale daily as needed.        No current facility-administered medications on file prior to visit.   Review of  Systems  Constitutional: Negative for unexpected weight change, or unusual diaphoresis  HENT: Negative for tinnitus.   Eyes: Negative for photophobia and visual disturbance.  Respiratory: Negative for choking and stridor.   Gastrointestinal: Negative for vomiting and blood in stool.  Genitourinary: Negative for hematuria and decreased urine volume.  Musculoskeletal: Negative for acute joint swelling Skin: Negative for color change and wound.  Neurological: Negative for tremors and numbness other than noted  Psychiatric/Behavioral: Negative for decreased concentration or  hyperactivity.       Objective:   Physical Exam BP 134/54  Pulse 66  Temp(Src) 97.6 F (36.4 C) (Oral)  Ht 5\' 5"  (1.651 m)  Wt 187 lb (84.823 kg)  BMI 31.12 kg/m2  SpO2 97% VS noted, obese, not ill apearing Constitutional: Pt appears well-developed and well-nourished.  HENT: Head: NCAT.  Right Ear: External ear normal.  Left Ear: External ear normal.  Bilat tm's with mild erythema.  Max sinus areas non tender.  Pharynx with mild erythema, no exudate Eyes: Conjunctivae and EOM are normal. Pupils are equal, round, and reactive to light.  Neck: Normal range of motion. Neck supple.  Cardiovascular: Normal rate and regular rhythm.   Pulmonary/Chest: Effort normal and breath sounds normal.  Abd:  Soft, NT, non-distended, + BS Spine: nontender except for tender over right SI joint area Neurological: Pt is alert. Not confused , motor/dtr intact, gait ok Skin: Skin is warm. No erythema. No rash Psychiatric: Pt behavior is normal. Thought content normal.     Assessment & Plan:

## 2013-01-04 ENCOUNTER — Other Ambulatory Visit: Payer: Self-pay | Admitting: Internal Medicine

## 2013-01-04 ENCOUNTER — Ambulatory Visit
Admission: RE | Admit: 2013-01-04 | Discharge: 2013-01-04 | Disposition: A | Discharge status: Home / Self Care | Payer: Medicare Other | Source: Ambulatory Visit | Attending: Internal Medicine | Admitting: Internal Medicine

## 2013-01-04 ENCOUNTER — Encounter: Payer: Self-pay | Admitting: Internal Medicine

## 2013-01-04 DIAGNOSIS — M5416 Radiculopathy, lumbar region: Secondary | ICD-10-CM

## 2013-01-04 DIAGNOSIS — M48061 Spinal stenosis, lumbar region without neurogenic claudication: Secondary | ICD-10-CM

## 2013-01-07 ENCOUNTER — Other Ambulatory Visit: Payer: Self-pay | Admitting: Internal Medicine

## 2013-05-17 ENCOUNTER — Ambulatory Visit: Payer: Medicare Other | Admitting: Internal Medicine

## 2013-06-27 ENCOUNTER — Ambulatory Visit (INDEPENDENT_AMBULATORY_CARE_PROVIDER_SITE_OTHER): Payer: Medicare Other | Admitting: Internal Medicine

## 2013-06-27 ENCOUNTER — Other Ambulatory Visit (INDEPENDENT_AMBULATORY_CARE_PROVIDER_SITE_OTHER): Payer: Medicare Other

## 2013-06-27 ENCOUNTER — Encounter: Payer: Self-pay | Admitting: Internal Medicine

## 2013-06-27 VITALS — BP 142/80 | HR 75 | Temp 97.0°F | Ht 65.0 in | Wt 190.4 lb

## 2013-06-27 DIAGNOSIS — Z0000 Encounter for general adult medical examination without abnormal findings: Secondary | ICD-10-CM

## 2013-06-27 DIAGNOSIS — Z23 Encounter for immunization: Secondary | ICD-10-CM

## 2013-06-27 DIAGNOSIS — E785 Hyperlipidemia, unspecified: Secondary | ICD-10-CM

## 2013-06-27 LAB — HEPATIC FUNCTION PANEL
AST: 17 U/L (ref 0–37)
Albumin: 3.6 g/dL (ref 3.5–5.2)
Alkaline Phosphatase: 84 U/L (ref 39–117)
Bilirubin, Direct: 0.1 mg/dL (ref 0.0–0.3)
Total Bilirubin: 0.6 mg/dL (ref 0.3–1.2)
Total Protein: 7 g/dL (ref 6.0–8.3)

## 2013-06-27 LAB — CBC WITH DIFFERENTIAL/PLATELET
Basophils Absolute: 0.1 10*3/uL (ref 0.0–0.1)
Basophils Relative: 0.4 % (ref 0.0–3.0)
Eosinophils Absolute: 0.2 10*3/uL (ref 0.0–0.7)
HCT: 43.1 % (ref 39.0–52.0)
Hemoglobin: 14.4 g/dL (ref 13.0–17.0)
Lymphocytes Relative: 36.1 % (ref 12.0–46.0)
Lymphs Abs: 4.1 10*3/uL — ABNORMAL HIGH (ref 0.7–4.0)
MCHC: 33.4 g/dL (ref 30.0–36.0)
MCV: 97.1 fl (ref 78.0–100.0)
Monocytes Absolute: 1.1 10*3/uL — ABNORMAL HIGH (ref 0.1–1.0)
Neutro Abs: 5.9 10*3/uL (ref 1.4–7.7)
Platelets: 289 10*3/uL (ref 150.0–400.0)
RDW: 14.9 % — ABNORMAL HIGH (ref 11.5–14.6)

## 2013-06-27 LAB — BASIC METABOLIC PANEL
Calcium: 9 mg/dL (ref 8.4–10.5)
Chloride: 99 mEq/L (ref 96–112)
Creatinine, Ser: 2 mg/dL — ABNORMAL HIGH (ref 0.4–1.5)
GFR: 33.21 mL/min — ABNORMAL LOW (ref >60.00)
Glucose, Bld: 88 mg/dL (ref 70–99)
Potassium: 3.9 mEq/L (ref 3.5–5.1)
Sodium: 136 mEq/L (ref 135–145)

## 2013-06-27 LAB — URINALYSIS, ROUTINE W REFLEX MICROSCOPIC
Bilirubin Urine: NEGATIVE
Ketones, ur: NEGATIVE
Leukocytes, UA: NEGATIVE
RBC / HPF: NONE SEEN (ref 0–?)
Total Protein, Urine: NEGATIVE
Urine Glucose: NEGATIVE
Urobilinogen, UA: 0.2 (ref 0.0–1.0)
pH: 6 (ref 5.0–8.0)

## 2013-06-27 LAB — LIPID PANEL
Total CHOL/HDL Ratio: 3
VLDL: 27.6 mg/dL (ref 0.0–40.0)

## 2013-06-27 LAB — TSH: TSH: 1.67 u[IU]/mL (ref 0.35–5.50)

## 2013-06-27 NOTE — Assessment & Plan Note (Signed)
Overall doing well, age appropriate education and counseling updated, referrals for preventative services and immunizations addressed, dietary and smoking counseling addressed, most recent labs reviewed.  I have personally reviewed and have noted: 1) the patient's medical and social history 2) The pt's use of alcohol, tobacco, and illicit drugs 3) The patient's current medications and supplements 4) Functional ability including ADL's, fall risk, home safety risk, hearing and visual impairment 5) Diet and physical activities 6) Evidence for depression or mood disorder 7) The patient's height, weight, and BMI have been recorded in the chart I have made referrals, and provided counseling and education based on review of the above For labs today, and flu shot, f/u 2 wks for the prevnar

## 2013-06-27 NOTE — Progress Notes (Signed)
Subjective:    Patient ID: [name], male    DOB: 31-Mar-1925, 79 y.o.   MRN: [id number]  HPI  Here for yearly f/u;  Overall doing ok;  Pt denies CP, worsening SOB, DOE, wheezing, orthopnea, PND, worsening LE edema, palpitations, dizziness or syncope.  Pt denies neurological change such as new headache, facial or extremity weakness.  Pt denies polydipsia, polyuria, or low sugar symptoms. Pt states overall good compliance with treatment and medications, good tolerability, and has been trying to follow lower cholesterol diet.  Pt denies worsening depressive symptoms, suicidal ideation or panic. No fever, night sweats, wt loss, loss of appetite, or other constitutional symptoms.  Pt states good ability with ADL's, has low fall risk, home safety reviewed and adequate, no other significant changes invision, though has minimal hearing ability to right ear only with hearing aid, walks with walker, still drives short distances, wife drives otherwise. Has polyarthritis but denies significant current pain.  Sees renal on regular basis, gfr about 35 per wife Past Medical History  Diagnosis Date  . ALLERGIC RHINITIS 09/29/2007  . BACK PAIN 01/31/2009  . BENIGN PROSTATIC HYPERTROPHY 05/12/2007  . CHEST PAIN 01/31/2008  . CHRONIC OBSTRUCTIVE PULMONARY DISEASE, ACUTE EXACERBATION 08/17/2007  . CONJUNCTIVITIS, ALLERGIC, CHRONIC 03/24/2010  . CONSTIPATION 02/19/2010  . COPD 05/12/2007  . GERD 02/23/2010  . GOUT 07/04/2010  . HYPERLIPIDEMIA 05/12/2007  . HYPERTENSION 05/12/2007  . NEPHROLITHIASIS, HX OF 08/17/2007  . OBESITY 05/12/2007  . PEPTIC ULCER DISEASE 05/12/2007  . PERIPHERAL VASCULAR DISEASE 05/12/2007  . Personal history of unspecified circulatory disease 05/12/2007  . PLANTAR FASCIITIS, RIGHT 09/27/2008  . RENAL CELL CANCER 05/12/2007  . RENAL INSUFFICIENCY 08/17/2007  . SKIN LESION 11/07/2010  . VERTIGO 09/29/2007  . WRIST PAIN, RIGHT 02/26/2009  . History of CVA (cerebrovascular accident) 05/03/2011   Past  Surgical History  Procedure Laterality Date  . Cataract extraction    . Ear surgury    . Nephroureterectomy    . Nephrectomy    . S.p left knee replacement  10/08  . S/p bilat fem pop bypass      reports that he has been smoking.  He does not have any smokeless tobacco history on file. He reports that he does not drink alcohol or use illicit drugs. family history includes Alcohol abuse in his brother; Cancer in his daughter, mother, other, and other; Heart disease in his brother. Allergies  Allergen Reactions  . Sulfonamide Derivatives     REACTION: rash   Current Outpatient Prescriptions on File Prior to Visit  Medication Sig Dispense Refill  . allopurinol (ZYLOPRIM) 100 MG tablet Take 1 tablet (100 mg total) by mouth daily.  90 tablet  3  . amLODipine (NORVASC) 5 MG tablet Take 1 tablet (5 mg total) by mouth daily.  90 tablet  2  . aspirin 325 MG tablet Take 325 mg by mouth daily.        . calcium-vitamin D (OSCAL WITH D) 500-200 MG-UNIT per tablet Take 1 tablet by mouth daily.        . cetirizine (ZYRTEC) 10 MG tablet Take 10 mg by mouth daily as needed.       . cyclobenzaprine (FLEXERIL) 5 MG tablet Take 5 mg by mouth 3 (three) times daily as needed.        . diphenhydrAMINE (BENADRYL) 25 mg capsule Take 25 mg by mouth daily as needed.      . fluticasone (FLONASE) 50 MCG/ACT nasal spray Place 2 sprays  into the nose daily.  16 g  4  . hydrochlorothiazide (HYDRODIURIL) 25 MG tablet Take 1 tablet (25 mg total) by mouth daily.  90 tablet  3  . indomethacin (INDOCIN) 50 MG capsule Take 50 mg by mouth 3 (three) times daily with meals. For acute gout only      . meclizine (ANTIVERT) 12.5 MG tablet Take 1 tablet (12.5 mg total) by mouth 4 (four) times daily as needed.  180 tablet  0  . omeprazole (PRILOSEC) 20 MG capsule Take 1 capsule (20 mg total) by mouth daily.  90 capsule  3  . polyethylene glycol powder (MIRALAX) powder Take 17 g by mouth daily as needed.       . pravastatin  (PRAVACHOL) 40 MG tablet Take 1 tablet (40 mg total) by mouth every evening.  90 tablet  3  . promethazine (PHENERGAN) 25 MG tablet Take 25 mg by mouth every 6 (six) hours as needed.        . tiotropium (SPIRIVA) 18 MCG inhalation capsule Place 18 mcg into inhaler and inhale daily as needed.        No current facility-administered medications on file prior to visit.   Review of Systems Constitutional: Negative for diaphoresis, activity change, appetite change or unexpected weight change.  HENT: Negative for hearing loss, ear pain, facial swelling, mouth sores and neck stiffness.   Eyes: Negative for pain, redness and visual disturbance.  Respiratory: Negative for shortness of breath and wheezing.   Cardiovascular: Negative for chest pain and palpitations.  Gastrointestinal: Negative for diarrhea, blood in stool, abdominal distention or other pain Genitourinary: Negative for hematuria, flank pain or change in urine volume.  Musculoskeletal: Negative for myalgias and joint swelling.  Skin: Negative for color change and wound.  Neurological: Negative for syncope and numbness. other than noted Hematological: Negative for adenopathy.  Psychiatric/Behavioral: Negative for hallucinations, self-injury, decreased concentration and agitation.      Objective:   Physical Exam BP 142/80  Pulse 75  Temp(Src) 97 F (36.1 C) (Oral)  Ht 5\' 5"  (1.651 m)  Wt 190 lb 6 oz (86.354 kg)  BMI 31.68 kg/m2  SpO2 96% VS noted, not ill appearing Constitutional: Pt is oriented to person, place, and time. Appears well-developed and well-nourished.  Head: Normocephalic and atraumatic.  Right Ear: External ear normal.  Left Ear: External ear normal.  Nose: Nose normal.  Mouth/Throat: Oropharynx is clear and moist.  Eyes: Conjunctivae and EOM are normal. Pupils are equal, round, and reactive to light.  Neck: Normal range of motion. Neck supple. No JVD present. No tracheal deviation present.  Cardiovascular:  Normal rate, regular rhythm, normal heart sounds and intact distal pulses.   Pulmonary/Chest: Effort normal and breath sounds decreased, no rales or wheezing.  Abdominal: Soft. Bowel sounds are normal. There is no tenderness. No HSM  Musculoskeletal: Normal range of motion. Exhibits no edema.  Lymphadenopathy:  Has no cervical adenopathy.  Neurological: Pt is alert and oriented to person, place, and time. Pt has normal reflexes. No cranial nerve deficit.  Skin: Skin is warm and dry. No rash noted.  Psychiatric:  Has  normal mood and affect. Behavior is normal.     Assessment & Plan:

## 2013-06-27 NOTE — Patient Instructions (Addendum)
You had the flu shot today Please return in 2 wks for the Nurse Visit for the new Prevnar pneumonia shot Please continue all other medications as before, and refills have been done if requested. Please have the pharmacy call with any other refills you may need. Please continue your efforts at being more active, low cholesterol diet, and weight control. You are otherwise up to date with prevention measures today.  Please keep your appointments with your specialists as you have planned - the kidney doctor  Please go to the LAB in the Basement (turn left off the elevator) for the tests to be done today You will be contacted by phone if any changes need to be made immediately.  Otherwise, you will receive a letter about your results with an explanation, but please check with MyChart first.  Please return in 1 year for your yearly visit, or sooner if needed

## 2013-06-28 ENCOUNTER — Ambulatory Visit: Payer: Medicare Other | Admitting: Internal Medicine

## 2013-07-11 ENCOUNTER — Ambulatory Visit (INDEPENDENT_AMBULATORY_CARE_PROVIDER_SITE_OTHER): Payer: Medicare Other

## 2013-07-11 DIAGNOSIS — Z23 Encounter for immunization: Secondary | ICD-10-CM

## 2013-07-14 ENCOUNTER — Other Ambulatory Visit: Payer: Self-pay | Admitting: Internal Medicine

## 2013-08-06 ENCOUNTER — Other Ambulatory Visit: Payer: Self-pay | Admitting: Internal Medicine

## 2013-09-26 ENCOUNTER — Other Ambulatory Visit: Payer: Self-pay

## 2013-09-26 MED ORDER — ALLOPURINOL 100 MG PO TABS: 100.0000 mg | ORAL_TABLET | Freq: Every day | ORAL | Status: DC

## 2013-09-26 MED ORDER — OMEPRAZOLE 20 MG PO CPDR: 20.0000 mg | DELAYED_RELEASE_CAPSULE | Freq: Every day | ORAL | Status: DC

## 2013-09-26 MED ORDER — MECLIZINE HCL 12.5 MG PO TABS: 12.5000 mg | ORAL_TABLET | Freq: Four times a day (QID) | ORAL | Status: DC | PRN

## 2013-09-26 MED ORDER — HYDROCHLOROTHIAZIDE 25 MG PO TABS: 25.0000 mg | ORAL_TABLET | Freq: Every day | ORAL | Status: DC

## 2013-09-26 MED ORDER — AMLODIPINE BESYLATE 5 MG PO TABS: 5.0000 mg | ORAL_TABLET | Freq: Every day | ORAL | Status: DC

## 2013-09-26 MED ORDER — PRAVASTATIN SODIUM 40 MG PO TABS: 40.0000 mg | ORAL_TABLET | Freq: Every evening | ORAL | Status: DC

## 2013-12-21 ENCOUNTER — Encounter: Payer: Self-pay | Admitting: Internal Medicine

## 2013-12-21 ENCOUNTER — Ambulatory Visit (INDEPENDENT_AMBULATORY_CARE_PROVIDER_SITE_OTHER): Payer: Commercial Managed Care - HMO | Admitting: Internal Medicine

## 2013-12-21 VITALS — BP 140/80 | HR 67 | Temp 96.8°F | Wt 186.4 lb

## 2013-12-21 DIAGNOSIS — N189 Chronic kidney disease, unspecified: Secondary | ICD-10-CM

## 2013-12-21 DIAGNOSIS — IMO0002 Reserved for concepts with insufficient information to code with codable children: Secondary | ICD-10-CM

## 2013-12-21 DIAGNOSIS — M5416 Radiculopathy, lumbar region: Secondary | ICD-10-CM

## 2013-12-21 DIAGNOSIS — M545 Low back pain, unspecified: Secondary | ICD-10-CM | POA: Insufficient documentation

## 2013-12-21 DIAGNOSIS — I10 Essential (primary) hypertension: Secondary | ICD-10-CM

## 2013-12-21 MED ORDER — METHYLPREDNISOLONE ACETATE 80 MG/ML IJ SUSP
80.0000 mg | Freq: Once | INTRAMUSCULAR | Status: AC
  Administered 2013-12-21: 80 mg via INTRAMUSCULAR

## 2013-12-21 MED ORDER — PREDNISONE 10 MG PO TABS: ORAL_TABLET | ORAL | Status: DC

## 2013-12-21 NOTE — Patient Instructions (Signed)
Please stop the advil and alleve  You had the steroid shot today  Please take all new medication as prescribed - the prednisone  Please continue all other medications as before, and refills have been done if requested. Please have the pharmacy call with any other refills you may need.  Please keep your appointments with your specialists as you have planned - Dr Posey Pronto next wk as planned with blood work  Please return in 6 months, or sooner if needed

## 2013-12-21 NOTE — Progress Notes (Signed)
Pre visit review using our clinic review tool, if applicable. No additional management support is needed unless otherwise documented below in the visit note. 

## 2013-12-21 NOTE — Assessment & Plan Note (Signed)
D/c alleve/advil, has appt next wk with Dr Patel/nephrology

## 2013-12-21 NOTE — Progress Notes (Signed)
Subjective:    Patient ID: [name], male    DOB: Jan 01, 1925, 80 y.o.   MRN: [id number]  HPI  Here to f/u with 4 days c/o acute onset right LBP with radiation to right buttock; but no bowel or bladder change, fever, wt loss,  worsening LE pain/numbness/weakness, gait change or falls.   Wife has helped with advil and alleve., unaware or risk with CKD.  Pt denies chest pain, increased sob or doe, wheezing, orthopnea, PND, increased LE swelling, palpitations, dizziness or syncope.  Pt denies new neurological symptoms such as new headache, or facial or extremity weakness or numbness   Pt denies polydipsia, polyuria Past Medical History  Diagnosis Date  . ALLERGIC RHINITIS 09/29/2007  . BACK PAIN 01/31/2009  . BENIGN PROSTATIC HYPERTROPHY 05/12/2007  . CHEST PAIN 01/31/2008  . CHRONIC OBSTRUCTIVE PULMONARY DISEASE, ACUTE EXACERBATION 08/17/2007  . CONJUNCTIVITIS, ALLERGIC, CHRONIC 03/24/2010  . CONSTIPATION 02/19/2010  . COPD 05/12/2007  . GERD 02/23/2010  . GOUT 07/04/2010  . HYPERLIPIDEMIA 05/12/2007  . HYPERTENSION 05/12/2007  . NEPHROLITHIASIS, HX OF 08/17/2007  . OBESITY 05/12/2007  . PEPTIC ULCER DISEASE 05/12/2007  . PERIPHERAL VASCULAR DISEASE 05/12/2007  . Personal history of unspecified circulatory disease 05/12/2007  . PLANTAR FASCIITIS, RIGHT 09/27/2008  . RENAL CELL CANCER 05/12/2007  . RENAL INSUFFICIENCY 08/17/2007  . SKIN LESION 11/07/2010  . VERTIGO 09/29/2007  . WRIST PAIN, RIGHT 02/26/2009  . History of CVA (cerebrovascular accident) 05/03/2011   Past Surgical History  Procedure Laterality Date  . Cataract extraction    . Ear surgury    . Nephroureterectomy    . Nephrectomy    . S.p left knee replacement  10/08  . S/p bilat fem pop bypass      reports that he has been smoking.  He does not have any smokeless tobacco history on file. He reports that he does not drink alcohol or use illicit drugs. family history includes Alcohol abuse in his brother; Cancer in his daughter,  mother, other, and other; Heart disease in his brother. Allergies  Allergen Reactions  . Sulfonamide Derivatives     REACTION: rash   Current Outpatient Prescriptions on File Prior to Visit  Medication Sig Dispense Refill  . allopurinol (ZYLOPRIM) 100 MG tablet Take 1 tablet (100 mg total) by mouth daily.  90 tablet  3  . amLODipine (NORVASC) 5 MG tablet Take 1 tablet (5 mg total) by mouth daily.  90 tablet  3  . aspirin 325 MG tablet Take 325 mg by mouth daily.        . calcium-vitamin D (OSCAL WITH D) 500-200 MG-UNIT per tablet Take 1 tablet by mouth daily.        . cetirizine (ZYRTEC) 10 MG tablet Take 10 mg by mouth daily as needed.       . cyclobenzaprine (FLEXERIL) 5 MG tablet Take 5 mg by mouth 3 (three) times daily as needed.        . diphenhydrAMINE (BENADRYL) 25 mg capsule Take 25 mg by mouth daily as needed.      . fluticasone (FLONASE) 50 MCG/ACT nasal spray Place 2 sprays into the nose daily.  16 g  4  . hydrochlorothiazide (HYDRODIURIL) 25 MG tablet Take 1 tablet (25 mg total) by mouth daily.  90 tablet  3  . indomethacin (INDOCIN) 50 MG capsule Take 50 mg by mouth 3 (three) times daily with meals. For acute gout only      . meclizine (ANTIVERT) 12.5  MG tablet Take 1 tablet (12.5 mg total) by mouth 4 (four) times daily as needed.  180 tablet  0  . omeprazole (PRILOSEC) 20 MG capsule TAKE 1 CAPSULE BY MOUTH ONCE DAILY  90 capsule  3  . omeprazole (PRILOSEC) 20 MG capsule Take 1 capsule (20 mg total) by mouth daily.  90 capsule  3  . polyethylene glycol powder (MIRALAX) powder Take 17 g by mouth daily as needed.       . pravastatin (PRAVACHOL) 40 MG tablet Take 1 tablet (40 mg total) by mouth every evening.  90 tablet  3  . promethazine (PHENERGAN) 25 MG tablet Take 25 mg by mouth every 6 (six) hours as needed.        . tiotropium (SPIRIVA) 18 MCG inhalation capsule Place 18 mcg into inhaler and inhale daily as needed.        No current facility-administered medications on file  prior to visit.   Review of Systems  Constitutional: Negative for unusual diaphoresis or other sweats  HENT: Negative for ringing in ear Eyes: Negative for double vision or worsening visual disturbance.  Respiratory: Negative for choking and stridor.   Gastrointestinal: Negative for vomiting or other signifcant bowel change Genitourinary: Negative for hematuria or decreased urine volume.  Musculoskeletal: Negative for other MSK pain or swelling Skin: Negative for color change and worsening wound.  Neurological: Negative for tremors and numbness other than noted  Psychiatric/Behavioral: Negative for decreased concentration or agitation other than above       Objective:   Physical Exam BP 140/80  Pulse 67  Temp(Src) 96.8 F (36 C) (Oral)  Wt 186 lb 6 oz (84.539 kg)  SpO2 95% VS noted,  Constitutional: Pt appears well-developed, well-nourished.  HENT: Head: NCAT.  Right Ear: External ear normal.  Left Ear: External ear normal.  Eyes: . Pupils are equal, round, and reactive to light. Conjunctivae and EOM are normal Neck: Normal range of motion. Neck supple.  Cardiovascular: Normal rate and regular rhythm.   Pulmonary/Chest: Effort normal and breath sounds normal.  Abd:  Soft, NT, ND, + BS Spine nontender, has mild right lumbar paravertebral tender, without rash Neurological: Pt is alert. Not confused , motor grossly intact Skin: Skin is warm. No rash Psychiatric: Pt behavior is normal. No agitation.     Assessment & Plan:

## 2013-12-21 NOTE — Assessment & Plan Note (Signed)
Rucurrent, for repeat depomedrol IM, predpack asd, to d/c nsaids due to CKD, tylenol prn ok,  to f/u any worsening symptoms or concerns

## 2013-12-21 NOTE — Assessment & Plan Note (Signed)
stable overall by history and exam, recent data reviewed with pt, and pt to continue medical treatment as before,  to f/u any worsening symptoms or concerns BP Readings from Last 3 Encounters:  12/21/13 140/80  06/27/13 142/80  12/28/12 134/54

## 2013-12-22 ENCOUNTER — Other Ambulatory Visit: Payer: Self-pay | Admitting: *Deleted

## 2013-12-22 ENCOUNTER — Telehealth: Payer: Self-pay | Admitting: Internal Medicine

## 2013-12-22 MED ORDER — PREDNISONE 10 MG PO TABS: ORAL_TABLET | ORAL | Status: DC

## 2013-12-22 NOTE — Telephone Encounter (Signed)
Relevant patient education mailed to patient.  

## 2014-01-09 ENCOUNTER — Telehealth: Payer: Self-pay

## 2014-01-09 MED ORDER — MECLIZINE HCL 12.5 MG PO TABS: 12.5000 mg | ORAL_TABLET | Freq: Four times a day (QID) | ORAL | Status: DC | PRN

## 2014-01-09 NOTE — Telephone Encounter (Signed)
Mail order refill done 

## 2014-03-27 ENCOUNTER — Other Ambulatory Visit: Payer: Self-pay

## 2014-03-27 MED ORDER — MECLIZINE HCL 12.5 MG PO TABS: 12.5000 mg | ORAL_TABLET | Freq: Four times a day (QID) | ORAL | Status: DC | PRN

## 2014-05-08 ENCOUNTER — Telehealth: Payer: Self-pay | Admitting: Internal Medicine

## 2014-05-08 NOTE — Telephone Encounter (Signed)
Referral # [id number], start date 06/22/14 exp 12/22/14 good for 6 visits

## 2014-05-08 NOTE — Telephone Encounter (Signed)
Patient needs [employer] referral to Dr. Regan Lemming with [facility].  Patient has OV scheduled 06/22/2014 at 11:15am.

## 2014-06-07 ENCOUNTER — Telehealth: Payer: Self-pay | Admitting: Internal Medicine

## 2014-06-07 DIAGNOSIS — H538 Other visual disturbances: Secondary | ICD-10-CM

## 2014-06-07 NOTE — Telephone Encounter (Signed)
Patient need a referral to see his eye doctor Dr Richardean Sale. Please advsie

## 2014-06-07 NOTE — Telephone Encounter (Signed)
Referral done

## 2014-06-19 ENCOUNTER — Ambulatory Visit (INDEPENDENT_AMBULATORY_CARE_PROVIDER_SITE_OTHER): Payer: Commercial Managed Care - HMO

## 2014-06-19 DIAGNOSIS — Z23 Encounter for immunization: Secondary | ICD-10-CM

## 2014-07-03 ENCOUNTER — Encounter: Payer: Self-pay | Admitting: Internal Medicine

## 2014-07-03 ENCOUNTER — Other Ambulatory Visit (INDEPENDENT_AMBULATORY_CARE_PROVIDER_SITE_OTHER): Payer: Commercial Managed Care - HMO

## 2014-07-03 ENCOUNTER — Ambulatory Visit (INDEPENDENT_AMBULATORY_CARE_PROVIDER_SITE_OTHER): Payer: Commercial Managed Care - HMO | Admitting: Internal Medicine

## 2014-07-03 VITALS — BP 140/72 | HR 75 | Temp 97.8°F | Wt 186.2 lb

## 2014-07-03 DIAGNOSIS — Z0000 Encounter for general adult medical examination without abnormal findings: Secondary | ICD-10-CM

## 2014-07-03 LAB — BASIC METABOLIC PANEL
BUN: 28 mg/dL — AB (ref 6–23)
CO2: 26 mEq/L (ref 19–32)
Calcium: 8.8 mg/dL (ref 8.4–10.5)
Chloride: 101 mEq/L (ref 96–112)
Creatinine, Ser: 2.2 mg/dL — ABNORMAL HIGH (ref 0.4–1.5)
GFR: 30.36 mL/min — ABNORMAL LOW (ref >60.00)
Glucose, Bld: 101 mg/dL — ABNORMAL HIGH (ref 70–99)
POTASSIUM: 4.1 meq/L (ref 3.5–5.1)
Sodium: 137 mEq/L (ref 135–145)

## 2014-07-03 LAB — URINALYSIS, ROUTINE W REFLEX MICROSCOPIC
BILIRUBIN URINE: NEGATIVE
HGB URINE DIPSTICK: NEGATIVE
Ketones, ur: NEGATIVE
LEUKOCYTES UA: NEGATIVE
NITRITE: NEGATIVE
RBC / HPF: NONE SEEN (ref 0–?)
Specific Gravity, Urine: 1.01 (ref 1.000–1.030)
Total Protein, Urine: NEGATIVE
Urine Glucose: NEGATIVE
Urobilinogen, UA: 0.2 (ref 0.0–1.0)
WBC, UA: NONE SEEN (ref 0–?)
pH: 6 (ref 5.0–8.0)

## 2014-07-03 LAB — CBC WITH DIFFERENTIAL/PLATELET
Basophils Absolute: 0.1 10*3/uL (ref 0.0–0.1)
Basophils Relative: 0.5 % (ref 0.0–3.0)
EOS ABS: 0.3 10*3/uL (ref 0.0–0.7)
Eosinophils Relative: 2.4 % (ref 0.0–5.0)
HCT: 43.4 % (ref 39.0–52.0)
Hemoglobin: 14.2 g/dL (ref 13.0–17.0)
Lymphocytes Relative: 42.1 % (ref 12.0–46.0)
Lymphs Abs: 4.9 10*3/uL — ABNORMAL HIGH (ref 0.7–4.0)
MCHC: 32.7 g/dL (ref 30.0–36.0)
MCV: 99 fl (ref 78.0–100.0)
Monocytes Absolute: 1.1 10*3/uL — ABNORMAL HIGH (ref 0.1–1.0)
Monocytes Relative: 9.3 % (ref 3.0–12.0)
NEUTROS PCT: 45.7 % (ref 43.0–77.0)
Neutro Abs: 5.3 10*3/uL (ref 1.4–7.7)
PLATELETS: 266 10*3/uL (ref 150.0–400.0)
RBC: 4.38 Mil/uL (ref 4.22–5.81)
RDW: 15.1 % (ref 11.5–15.5)
WBC: 11.6 10*3/uL — ABNORMAL HIGH (ref 4.0–10.5)

## 2014-07-03 LAB — LIPID PANEL
CHOL/HDL RATIO: 4
Cholesterol: 126 mg/dL (ref 0–200)
HDL: 34.4 mg/dL — ABNORMAL LOW (ref >39.00)
LDL Cholesterol: 57 mg/dL (ref 0–99)
NonHDL: 91.6
TRIGLYCERIDES: 175 mg/dL — AB (ref 0.0–149.0)
VLDL: 35 mg/dL (ref 0.0–40.0)

## 2014-07-03 LAB — HEPATIC FUNCTION PANEL
ALT: 10 U/L (ref 0–53)
AST: 14 U/L (ref 0–37)
Albumin: 3.2 g/dL — ABNORMAL LOW (ref 3.5–5.2)
Alkaline Phosphatase: 85 U/L (ref 39–117)
BILIRUBIN DIRECT: 0.1 mg/dL (ref 0.0–0.3)
Total Bilirubin: 0.4 mg/dL (ref 0.2–1.2)
Total Protein: 6.8 g/dL (ref 6.0–8.3)

## 2014-07-03 LAB — TSH: TSH: 2.26 u[IU]/mL (ref 0.35–4.50)

## 2014-07-03 NOTE — Progress Notes (Signed)
Subjective:    Patient ID: [name], male    DOB: December 04, 1924, 80 y.o.   MRN: [id number]  HPI  Here for wellness and f/u;  Overall doing ok;  Pt denies CP, worsening SOB, DOE, wheezing, orthopnea, PND, worsening LE edema, palpitations, dizziness or syncope.  Pt denies neurological change such as new headache, facial or extremity weakness.  Pt denies polydipsia, polyuria, or low sugar symptoms. Pt states overall good compliance with treatment and medications, good tolerability, and has been trying to follow lower cholesterol diet.  Pt denies worsening depressive symptoms, suicidal ideation or panic. No fever, night sweats, wt loss, loss of appetite, or other constitutional symptoms.  Pt states good ability with ADL's, has low fall risk, home safety reviewed and adequate, no other significant changes in hearing or vision, walks with cane.  Lost balance and tumbled off porch June 2015 but no injury, no other falls. Has lost some wt intentionally -  Wt Readings from Last 3 Encounters:  07/03/14 186 lb 4 oz (84.482 kg)  12/21/13 186 lb 6 oz (84.539 kg)  06/27/13 190 lb 6 oz (86.354 kg)  No acute complaints.  Does have nocturia x 3-4 but stable, has urinal at bedside, no change in several yrs. Past Medical History  Diagnosis Date  . ALLERGIC RHINITIS 09/29/2007  . BACK PAIN 01/31/2009  . BENIGN PROSTATIC HYPERTROPHY 05/12/2007  . CHEST PAIN 01/31/2008  . CHRONIC OBSTRUCTIVE PULMONARY DISEASE, ACUTE EXACERBATION 08/17/2007  . CONJUNCTIVITIS, ALLERGIC, CHRONIC 03/24/2010  . CONSTIPATION 02/19/2010  . COPD 05/12/2007  . GERD 02/23/2010  . GOUT 07/04/2010  . HYPERLIPIDEMIA 05/12/2007  . HYPERTENSION 05/12/2007  . NEPHROLITHIASIS, HX OF 08/17/2007  . OBESITY 05/12/2007  . PEPTIC ULCER DISEASE 05/12/2007  . PERIPHERAL VASCULAR DISEASE 05/12/2007  . Personal history of unspecified circulatory disease 05/12/2007  . PLANTAR FASCIITIS, RIGHT 09/27/2008  . RENAL CELL CANCER 05/12/2007  . RENAL INSUFFICIENCY  08/17/2007  . SKIN LESION 11/07/2010  . VERTIGO 09/29/2007  . WRIST PAIN, RIGHT 02/26/2009  . History of CVA (cerebrovascular accident) 05/03/2011   Past Surgical History  Procedure Laterality Date  . Cataract extraction    . Ear surgury    . Nephroureterectomy    . Nephrectomy    . S.p left knee replacement  10/08  . S/p bilat fem pop bypass      reports that he has been smoking.  He does not have any smokeless tobacco history on file. He reports that he does not drink alcohol or use illicit drugs. family history includes Alcohol abuse in his brother; Cancer in his daughter, mother, other, and other; Heart disease in his brother. Allergies  Allergen Reactions  . Sulfonamide Derivatives     REACTION: rash   Current Outpatient Prescriptions on File Prior to Visit  Medication Sig Dispense Refill  . allopurinol (ZYLOPRIM) 100 MG tablet Take 1 tablet (100 mg total) by mouth daily. 90 tablet 3  . amLODipine (NORVASC) 5 MG tablet Take 1 tablet (5 mg total) by mouth daily. 90 tablet 3  . aspirin 325 MG tablet Take 325 mg by mouth daily.      . calcium-vitamin D (OSCAL WITH D) 500-200 MG-UNIT per tablet Take 1 tablet by mouth daily.      . cetirizine (ZYRTEC) 10 MG tablet Take 10 mg by mouth daily as needed.     . cyclobenzaprine (FLEXERIL) 5 MG tablet Take 5 mg by mouth 3 (three) times daily as needed.      Marland Kitchen  diphenhydrAMINE (BENADRYL) 25 mg capsule Take 25 mg by mouth daily as needed.    . fluticasone (FLONASE) 50 MCG/ACT nasal spray Place 2 sprays into the nose daily. 16 g 4  . hydrochlorothiazide (HYDRODIURIL) 25 MG tablet Take 1 tablet (25 mg total) by mouth daily. 90 tablet 3  . indomethacin (INDOCIN) 50 MG capsule Take 50 mg by mouth 3 (three) times daily with meals. For acute gout only    . meclizine (ANTIVERT) 12.5 MG tablet Take 1 tablet (12.5 mg total) by mouth 4 (four) times daily as needed. 180 tablet 2  . omeprazole (PRILOSEC) 20 MG capsule TAKE 1 CAPSULE BY MOUTH ONCE DAILY 90  capsule 3  . omeprazole (PRILOSEC) 20 MG capsule Take 1 capsule (20 mg total) by mouth daily. 90 capsule 3  . polyethylene glycol powder (MIRALAX) powder Take 17 g by mouth daily as needed.     . pravastatin (PRAVACHOL) 40 MG tablet Take 1 tablet (40 mg total) by mouth every evening. 90 tablet 3   No current facility-administered medications on file prior to visit.     Review of Systems  Constitutional: Negative for increased diaphoresis, other activity, appetite or other siginficant weight change  HENT: Negative for worsening hearing loss, ear pain, facial swelling, mouth sores and neck stiffness.   Eyes: Negative for other worsening pain, redness or visual disturbance.  Respiratory: Negative for shortness of breath and wheezing.   Cardiovascular: Negative for chest pain and palpitations.  Gastrointestinal: Negative for diarrhea, blood in stool, abdominal distention or other pain Genitourinary: Negative for hematuria, flank pain or change in urine volume.  Musculoskeletal: Negative for myalgias or other joint complaints.  Skin: Negative for color change and wound.  Neurological: Negative for syncope and numbness. other than noted Hematological: Negative for adenopathy. or other swelling Psychiatric/Behavioral: Negative for hallucinations, self-injury, decreased concentration or other worsening agitation.      Objective:   Physical Exam BP 140/72 mmHg  Pulse 75  Temp(Src) 97.8 F (36.6 C) (Oral)  Wt 186 lb 4 oz (84.482 kg)  SpO2 95% VS noted, some visible wt loss - mild Constitutional: Pt is oriented to person, place, and time. Appears well-developed and well-nourished.  Head: Normocephalic and atraumatic.  Right Ear: External ear normal.  Left Ear: External ear normal.  Nose: Nose normal.  Mouth/Throat: Oropharynx is clear and moist.  Eyes: Conjunctivae and EOM are normal. Pupils are equal, round, and reactive to light.  Neck: Normal range of motion. Neck supple. No JVD  present. No tracheal deviation present.  Cardiovascular: Normal rate, regular rhythm, normal heart sounds and intact distal pulses.   Pulmonary/Chest: Effort normal and breath sounds without rales or wheezing  Abdominal: Soft. Bowel sounds are normal. NT. No HSM  Musculoskeletal: Normal range of motion. Exhibits no edema.  Lymphadenopathy:  Has no cervical adenopathy.  Neurological: Pt is alert and oriented to person, place, and time. Pt has normal reflexes. No cranial nerve deficit. Motor grossly intact Skin: Skin is warm and dry. No rash noted.  Psychiatric:  Has normal mood and affect. Behavior is normal.     Assessment & Plan:

## 2014-07-03 NOTE — Assessment & Plan Note (Signed)

## 2014-07-03 NOTE — Progress Notes (Signed)
Pre visit review using our clinic review tool, if applicable. No additional management support is needed unless otherwise documented below in the visit note. 

## 2014-07-03 NOTE — Patient Instructions (Signed)

## 2014-07-04 ENCOUNTER — Encounter: Payer: Self-pay | Admitting: Internal Medicine

## 2014-07-18 ENCOUNTER — Encounter: Payer: Self-pay | Admitting: Internal Medicine

## 2014-09-13 ENCOUNTER — Other Ambulatory Visit: Payer: Self-pay | Admitting: Internal Medicine

## 2014-09-13 ENCOUNTER — Other Ambulatory Visit: Payer: Self-pay

## 2014-09-13 MED ORDER — MECLIZINE HCL 12.5 MG PO TABS: 12.5000 mg | ORAL_TABLET | Freq: Four times a day (QID) | ORAL | Status: DC | PRN

## 2014-12-04 ENCOUNTER — Other Ambulatory Visit: Payer: Self-pay | Admitting: Internal Medicine

## 2014-12-04 NOTE — Telephone Encounter (Signed)
Pt has requested Prednisone but is not normally a refillable med on request  Please consider OV

## 2015-01-01 ENCOUNTER — Encounter: Payer: Self-pay | Admitting: Internal Medicine

## 2015-01-01 ENCOUNTER — Ambulatory Visit (INDEPENDENT_AMBULATORY_CARE_PROVIDER_SITE_OTHER): Payer: Commercial Managed Care - HMO | Admitting: Internal Medicine

## 2015-01-01 VITALS — BP 118/64 | HR 54 | Temp 97.5°F | Resp 18 | Ht 65.0 in | Wt 184.0 lb

## 2015-01-01 DIAGNOSIS — I10 Essential (primary) hypertension: Secondary | ICD-10-CM | POA: Diagnosis not present

## 2015-01-01 DIAGNOSIS — N183 Chronic kidney disease, stage 3 (moderate): Secondary | ICD-10-CM | POA: Diagnosis not present

## 2015-01-01 DIAGNOSIS — E785 Hyperlipidemia, unspecified: Secondary | ICD-10-CM

## 2015-01-01 DIAGNOSIS — Z0189 Encounter for other specified special examinations: Secondary | ICD-10-CM

## 2015-01-01 DIAGNOSIS — Z0000 Encounter for general adult medical examination without abnormal findings: Secondary | ICD-10-CM

## 2015-01-01 DIAGNOSIS — D649 Anemia, unspecified: Secondary | ICD-10-CM | POA: Diagnosis not present

## 2015-01-01 NOTE — Assessment & Plan Note (Signed)
stable overall by history and exam, recent data reviewed with pt, and pt to continue medical treatment as before,  to f/u any worsening symptoms or concerns Lab Results  Component Value Date   WBC 11.6* 07/03/2014   HGB 14.2 07/03/2014   HCT 43.4 07/03/2014   PLT 266.0 07/03/2014   GLUCOSE 101* 07/03/2014   CHOL 126 07/03/2014   TRIG 175.0* 07/03/2014   HDL 34.40* 07/03/2014   LDLCALC 57 07/03/2014   ALT 10 07/03/2014   AST 14 07/03/2014   NA 137 07/03/2014   K 4.1 07/03/2014   CL 101 07/03/2014   CREATININE 2.2* 07/03/2014   BUN 28* 07/03/2014   CO2 26 07/03/2014   TSH 2.26 07/03/2014   PSA 0.94 03/18/2010   INR 2.8* 10/28/2007   Has appt in f/u with renal with labs next wk, will forgo labs here

## 2015-01-01 NOTE — Patient Instructions (Signed)
Please continue all other medications as before, and refills have been done if requested.  Please have the pharmacy call with any other refills you may need.  Please continue your efforts at being more active, low cholesterol diet, and weight control.  You are otherwise up to date with prevention measures today.  Please keep your appointments with your specialists as you may have planned  Please return in 6 months, or sooner if needed, with Lab testing done 3-5 days before  

## 2015-01-01 NOTE — Assessment & Plan Note (Signed)
stable overall by history and exam, recent data reviewed with pt, and pt to continue medical treatment as before,  to f/u any worsening symptoms or concerns BP Readings from Last 3 Encounters:  01/01/15 118/64  07/03/14 140/72  12/21/13 140/80

## 2015-01-01 NOTE — Progress Notes (Signed)
Subjective:    Patient ID: [name], male    DOB: 08/30/1925, 81 y.o.   MRN: [id number]  HPI  Here to f/u; overall doing ok,  Pt denies chest pain, increasing sob or doe, wheezing, orthopnea, PND, increased LE swelling, palpitations, dizziness or syncope.  Pt denies new neurological symptoms such as new headache, or facial or extremity weakness or numbness.  Pt denies polydipsia, polyuria, or low sugar episode.   Pt denies new neurological symptoms such as new headache, or facial or extremity weakness or numbness.   Pt states overall good compliance with meds, mostly trying to follow appropriate diet, with wt overall stable,  but little exercise however.  Has CKD, volume stable  No current compalints  No recent falls, walks with cane Past Medical History  Diagnosis Date  . ALLERGIC RHINITIS 09/29/2007  . BACK PAIN 01/31/2009  . BENIGN PROSTATIC HYPERTROPHY 05/12/2007  . CHEST PAIN 01/31/2008  . CHRONIC OBSTRUCTIVE PULMONARY DISEASE, ACUTE EXACERBATION 08/17/2007  . CONJUNCTIVITIS, ALLERGIC, CHRONIC 03/24/2010  . CONSTIPATION 02/19/2010  . COPD 05/12/2007  . GERD 02/23/2010  . GOUT 07/04/2010  . HYPERLIPIDEMIA 05/12/2007  . HYPERTENSION 05/12/2007  . NEPHROLITHIASIS, HX OF 08/17/2007  . OBESITY 05/12/2007  . PEPTIC ULCER DISEASE 05/12/2007  . PERIPHERAL VASCULAR DISEASE 05/12/2007  . Personal history of unspecified circulatory disease 05/12/2007  . PLANTAR FASCIITIS, RIGHT 09/27/2008  . RENAL CELL CANCER 05/12/2007  . RENAL INSUFFICIENCY 08/17/2007  . SKIN LESION 11/07/2010  . VERTIGO 09/29/2007  . WRIST PAIN, RIGHT 02/26/2009  . History of CVA (cerebrovascular accident) 05/03/2011   Past Surgical History  Procedure Laterality Date  . Cataract extraction    . Ear surgury    . Nephroureterectomy    . Nephrectomy    . S.p left knee replacement  10/08  . S/p bilat fem pop bypass      reports that he has been smoking.  He does not have any smokeless tobacco history on file. He reports that he does  not drink alcohol or use illicit drugs. family history includes Alcohol abuse in his brother; Cancer in his daughter, mother, other, and other; Heart disease in his brother. Allergies  Allergen Reactions  . Sulfonamide Derivatives     REACTION: rash   Current Outpatient Prescriptions on File Prior to Visit  Medication Sig Dispense Refill  . allopurinol (ZYLOPRIM) 100 MG tablet TAKE 1 TABLET EVERY DAY 90 tablet 3  . amLODipine (NORVASC) 5 MG tablet TAKE 1 TABLET EVERY DAY 90 tablet 3  . aspirin 325 MG tablet Take 325 mg by mouth daily.      . calcium-vitamin D (OSCAL WITH D) 500-200 MG-UNIT per tablet Take 1 tablet by mouth daily.      . cetirizine (ZYRTEC) 10 MG tablet Take 10 mg by mouth daily as needed.     . cyclobenzaprine (FLEXERIL) 5 MG tablet Take 5 mg by mouth 3 (three) times daily as needed.      . diphenhydrAMINE (BENADRYL) 25 mg capsule Take 25 mg by mouth daily as needed.    . fluticasone (FLONASE) 50 MCG/ACT nasal spray Place 2 sprays into the nose daily. 16 g 4  . hydrochlorothiazide (HYDRODIURIL) 25 MG tablet TAKE 1 TABLET EVERY DAY 90 tablet 0  . indomethacin (INDOCIN) 50 MG capsule Take 50 mg by mouth 3 (three) times daily with meals. For acute gout only    . meclizine (ANTIVERT) 12.5 MG tablet Take 1 tablet (12.5 mg total) by mouth 4 (four)  times daily as needed. 180 tablet 2  . omeprazole (PRILOSEC) 20 MG capsule TAKE 1 CAPSULE BY MOUTH ONCE DAILY 90 capsule 3  . omeprazole (PRILOSEC) 20 MG capsule Take 1 capsule (20 mg total) by mouth daily. 90 capsule 3  . omeprazole (PRILOSEC) 20 MG capsule TAKE 1 CAPSULE EVERY DAY 90 capsule 0  . omeprazole (PRILOSEC) 20 MG capsule TAKE 1 CAPSULE EVERY DAY 90 capsule 3  . polyethylene glycol powder (MIRALAX) powder Take 17 g by mouth daily as needed.     . pravastatin (PRAVACHOL) 40 MG tablet TAKE 1 TABLET EVERY EVENING 90 tablet 3   No current facility-administered medications on file prior to visit.   Review of Systems   Constitutional: Negative for unusual diaphoresis or night sweats HENT: Negative for ringing in ear or discharge Eyes: Negative for double vision or worsening visual disturbance.  Respiratory: Negative for choking and stridor.   Gastrointestinal: Negative for vomiting or other signifcant bowel change Genitourinary: Negative for hematuria or change in urine volume.  Musculoskeletal: Negative for other MSK pain or swelling Skin: Negative for color change and worsening wound.  Neurological: Negative for tremors and numbness other than noted  Psychiatric/Behavioral: Negative for decreased concentration or agitation other than above       Objective:   Physical Exam BP 118/64 mmHg  Pulse 54  Temp(Src) 97.5 F (36.4 C) (Oral)  Resp 18  Ht 5\' 5"  (1.651 m)  Wt 184 lb 0.6 oz (83.48 kg)  BMI 30.63 kg/m2  SpO2 96% VS noted,  Constitutional: Pt appears in no significant distress HENT: Head: NCAT.  Right Ear: External ear normal.  Left Ear: External ear normal.  Eyes: . Pupils are equal, round, and reactive to light. Conjunctivae and EOM are normal Neck: Normal range of motion. Neck supple.  Cardiovascular: Normal rate and regular rhythm.   Pulmonary/Chest: Effort normal and breath sounds decreased but without rales or wheezing.  Abd:  Soft, NT, ND, + BS Neurological: Pt is alert. Not confused , motor grossly intact Skin: Skin is warm. No rash, no LE edema Psychiatric: Pt behavior is normal. No agitation.      Assessment & Plan:

## 2015-01-01 NOTE — Assessment & Plan Note (Signed)
stable overall by history and exam, recent data reviewed with pt, and pt to continue medical treatment as before,  to f/u any worsening symptoms or concerns Lab Results  Component Value Date   LDLCALC 57 07/03/2014

## 2015-01-09 DIAGNOSIS — I10 Essential (primary) hypertension: Secondary | ICD-10-CM | POA: Diagnosis not present

## 2015-01-09 DIAGNOSIS — N183 Chronic kidney disease, stage 3 (moderate): Secondary | ICD-10-CM | POA: Diagnosis not present

## 2015-01-09 DIAGNOSIS — J22 Unspecified acute lower respiratory infection: Secondary | ICD-10-CM | POA: Diagnosis not present

## 2015-01-09 DIAGNOSIS — Q600 Renal agenesis, unilateral: Secondary | ICD-10-CM | POA: Diagnosis not present

## 2015-01-10 ENCOUNTER — Telehealth: Payer: Self-pay | Admitting: Internal Medicine

## 2015-01-10 MED ORDER — HYDROCHLOROTHIAZIDE 25 MG PO TABS: 25.0000 mg | ORAL_TABLET | Freq: Every day | ORAL | Status: DC

## 2015-01-10 NOTE — Telephone Encounter (Signed)
Notified wife rx sent to pharmacy...Johny Chess

## 2015-01-10 NOTE — Telephone Encounter (Signed)
Patient is requesting refill for hydrochlorothiazide (HYDRODIURIL) 25 MG tablet [[id number]] . Please send to [facility] at [address]. She usually has this through [facility] but it did not get requested and she is almost out of medication.

## 2015-01-29 ENCOUNTER — Telehealth: Payer: Self-pay | Admitting: Internal Medicine

## 2015-01-29 MED ORDER — HYDROCHLOROTHIAZIDE 25 MG PO TABS: 25.0000 mg | ORAL_TABLET | Freq: Every day | ORAL | Status: DC

## 2015-01-29 NOTE — Telephone Encounter (Signed)
Patient is requesting refill for hydrochlorothiazide (HYDRODIURIL) 25 MG tablet [[id number]]   Please sent to [place]

## 2015-06-03 ENCOUNTER — Other Ambulatory Visit: Payer: Self-pay | Admitting: Internal Medicine

## 2015-06-26 ENCOUNTER — Other Ambulatory Visit (INDEPENDENT_AMBULATORY_CARE_PROVIDER_SITE_OTHER): Payer: Commercial Managed Care - HMO

## 2015-06-26 DIAGNOSIS — Z0189 Encounter for other specified special examinations: Secondary | ICD-10-CM | POA: Diagnosis not present

## 2015-06-26 DIAGNOSIS — Z0000 Encounter for general adult medical examination without abnormal findings: Secondary | ICD-10-CM

## 2015-06-26 LAB — URINALYSIS, ROUTINE W REFLEX MICROSCOPIC
Bilirubin Urine: NEGATIVE
Hgb urine dipstick: NEGATIVE
Ketones, ur: NEGATIVE
Leukocytes, UA: NEGATIVE
Nitrite: NEGATIVE
PH: 7 (ref 5.0–8.0)
RBC / HPF: NONE SEEN (ref 0–?)
SPECIFIC GRAVITY, URINE: 1.015 (ref 1.000–1.030)
Total Protein, Urine: NEGATIVE
URINE GLUCOSE: NEGATIVE
Urobilinogen, UA: 0.2 (ref 0.0–1.0)
WBC, UA: NONE SEEN (ref 0–?)

## 2015-06-26 LAB — HEPATIC FUNCTION PANEL
ALT: 9 U/L (ref 0–53)
AST: 11 U/L (ref 0–37)
Albumin: 3.3 g/dL — ABNORMAL LOW (ref 3.5–5.2)
Alkaline Phosphatase: 79 U/L (ref 39–117)
Bilirubin, Direct: 0.1 mg/dL (ref 0.0–0.3)
TOTAL PROTEIN: 6.3 g/dL (ref 6.0–8.3)
Total Bilirubin: 0.4 mg/dL (ref 0.2–1.2)

## 2015-06-26 LAB — BASIC METABOLIC PANEL
BUN: 28 mg/dL — AB (ref 6–23)
CALCIUM: 8.9 mg/dL (ref 8.4–10.5)
CO2: 28 meq/L (ref 19–32)
Chloride: 105 mEq/L (ref 96–112)
Creatinine, Ser: 2.11 mg/dL — ABNORMAL HIGH (ref 0.40–1.50)
GFR: 31.62 mL/min — AB (ref >60.00)
GLUCOSE: 97 mg/dL (ref 70–99)
Potassium: 4.1 mEq/L (ref 3.5–5.1)
Sodium: 141 mEq/L (ref 135–145)

## 2015-06-26 LAB — CBC WITH DIFFERENTIAL/PLATELET
Basophils Absolute: 0 10*3/uL (ref 0.0–0.1)
Basophils Relative: 0.3 % (ref 0.0–3.0)
EOS PCT: 4 % (ref 0.0–5.0)
Eosinophils Absolute: 0.5 10*3/uL (ref 0.0–0.7)
HCT: 42.2 % (ref 39.0–52.0)
HEMOGLOBIN: 13.9 g/dL (ref 13.0–17.0)
Lymphocytes Relative: 44.4 % (ref 12.0–46.0)
Lymphs Abs: 5.1 10*3/uL — ABNORMAL HIGH (ref 0.7–4.0)
MCHC: 32.8 g/dL (ref 30.0–36.0)
MCV: 98.3 fl (ref 78.0–100.0)
Monocytes Absolute: 1.1 10*3/uL — ABNORMAL HIGH (ref 0.1–1.0)
Monocytes Relative: 9.5 % (ref 3.0–12.0)
NEUTROS PCT: 41.8 % — AB (ref 43.0–77.0)
Neutro Abs: 4.8 10*3/uL (ref 1.4–7.7)
Platelets: 247 10*3/uL (ref 150.0–400.0)
RBC: 4.29 Mil/uL (ref 4.22–5.81)
RDW: 14.8 % (ref 11.5–15.5)
WBC: 11.4 10*3/uL — AB (ref 4.0–10.5)

## 2015-06-26 LAB — LIPID PANEL
CHOLESTEROL: 123 mg/dL (ref 0–200)
HDL: 35.5 mg/dL — ABNORMAL LOW (ref >39.00)
LDL Cholesterol: 59 mg/dL (ref 0–99)
NonHDL: 87.18
TRIGLYCERIDES: 143 mg/dL (ref 0.0–149.0)
Total CHOL/HDL Ratio: 3
VLDL: 28.6 mg/dL (ref 0.0–40.0)

## 2015-06-26 LAB — TSH: TSH: 3.01 u[IU]/mL (ref 0.35–4.50)

## 2015-07-04 ENCOUNTER — Encounter: Payer: Self-pay | Admitting: Internal Medicine

## 2015-07-04 ENCOUNTER — Ambulatory Visit (INDEPENDENT_AMBULATORY_CARE_PROVIDER_SITE_OTHER): Payer: Commercial Managed Care - HMO | Admitting: Internal Medicine

## 2015-07-04 VITALS — BP 134/82 | HR 61 | Temp 97.5°F | Ht 65.0 in | Wt 181.0 lb

## 2015-07-04 DIAGNOSIS — Z23 Encounter for immunization: Secondary | ICD-10-CM | POA: Diagnosis not present

## 2015-07-04 DIAGNOSIS — Z0000 Encounter for general adult medical examination without abnormal findings: Secondary | ICD-10-CM | POA: Diagnosis not present

## 2015-07-04 NOTE — Progress Notes (Signed)
Subjective:    Patient ID: [name], male    DOB: February 16, 1925, 81 y.o.   MRN: [id number]  HPI Here for wellness and f/u;  Overall doing ok;  Pt denies Chest pain, worsening SOB, DOE, wheezing, orthopnea, PND, worsening LE edema, palpitations, dizziness or syncope.  Pt denies neurological change such as new headache, facial or extremity weakness.  Pt denies polydipsia, polyuria, or low sugar symptoms. Pt states overall good compliance with treatment and medications, good tolerability, and has been trying to follow appropriate diet.  Pt denies worsening depressive symptoms, suicidal ideation or panic. No fever, night sweats, wt loss, loss of appetite, or other constitutional symptoms.  Pt states good ability with ADL's, has low fall risk, home safety reviewed and adequate, no other significant changes in hearing or vision, and only occasionally active with exercise.  Has some leg jerking that will occas wake him up at night, but taking tylenol at hs resolves this Past Medical History  Diagnosis Date  . ALLERGIC RHINITIS 09/29/2007  . BACK PAIN 01/31/2009  . BENIGN PROSTATIC HYPERTROPHY 05/12/2007  . CHEST PAIN 01/31/2008  . CHRONIC OBSTRUCTIVE PULMONARY DISEASE, ACUTE EXACERBATION 08/17/2007  . CONJUNCTIVITIS, ALLERGIC, CHRONIC 03/24/2010  . CONSTIPATION 02/19/2010  . COPD 05/12/2007  . GERD 02/23/2010  . GOUT 07/04/2010  . HYPERLIPIDEMIA 05/12/2007  . HYPERTENSION 05/12/2007  . NEPHROLITHIASIS, HX OF 08/17/2007  . OBESITY 05/12/2007  . PEPTIC ULCER DISEASE 05/12/2007  . PERIPHERAL VASCULAR DISEASE 05/12/2007  . Personal history of unspecified circulatory disease 05/12/2007  . PLANTAR FASCIITIS, RIGHT 09/27/2008  . RENAL CELL CANCER 05/12/2007  . RENAL INSUFFICIENCY 08/17/2007  . SKIN LESION 11/07/2010  . VERTIGO 09/29/2007  . WRIST PAIN, RIGHT 02/26/2009  . History of CVA (cerebrovascular accident) 05/03/2011   Past Surgical History  Procedure Laterality Date  . Cataract extraction    . Ear surgury     . Nephroureterectomy    . Nephrectomy    . S.p left knee replacement  10/08  . S/p bilat fem pop bypass      reports that he has been smoking.  He does not have any smokeless tobacco history on file. He reports that he does not drink alcohol or use illicit drugs. family history includes Alcohol abuse in his brother; Cancer in his daughter, mother, other, and other; Heart disease in his brother. Allergies  Allergen Reactions  . Sulfonamide Derivatives     REACTION: rash   Current Outpatient Prescriptions on File Prior to Visit  Medication Sig Dispense Refill  . allopurinol (ZYLOPRIM) 100 MG tablet TAKE 1 TABLET EVERY DAY 90 tablet 3  . amLODipine (NORVASC) 5 MG tablet TAKE 1 TABLET EVERY DAY 90 tablet 3  . aspirin 325 MG tablet Take 325 mg by mouth daily.      . calcium-vitamin D (OSCAL WITH D) 500-200 MG-UNIT per tablet Take 1 tablet by mouth daily.      . diphenhydrAMINE (BENADRYL) 25 mg capsule Take 25 mg by mouth daily as needed.    . hydrochlorothiazide (HYDRODIURIL) 25 MG tablet Take 1 tablet (25 mg total) by mouth daily. 90 tablet 3  . indomethacin (INDOCIN) 50 MG capsule Take 50 mg by mouth 3 (three) times daily with meals. For acute gout only    . meclizine (ANTIVERT) 12.5 MG tablet TAKE 1 TABLET FOUR TIMES DAILY AS NEEDED 180 tablet 0  . omeprazole (PRILOSEC) 20 MG capsule Take 1 capsule (20 mg total) by mouth daily. 90 capsule 3  . pravastatin (  PRAVACHOL) 40 MG tablet TAKE 1 TABLET EVERY EVENING 90 tablet 3  . cetirizine (ZYRTEC) 10 MG tablet Take 10 mg by mouth daily as needed.     . cyclobenzaprine (FLEXERIL) 5 MG tablet Take 5 mg by mouth 3 (three) times daily as needed.      . fluticasone (FLONASE) 50 MCG/ACT nasal spray Place 2 sprays into the nose daily. (Patient not taking: Reported on 07/04/2015) 16 g 4  . polyethylene glycol powder (MIRALAX) powder Take 17 g by mouth daily as needed.      No current facility-administered medications on file prior to visit.   Review  of Systems Constitutional: Negative for increased diaphoresis, other activity, appetite or siginficant weight change other than noted HENT: Negative for worsening hearing loss, ear pain, facial swelling, mouth sores and neck stiffness.   Eyes: Negative for other worsening pain, redness or visual disturbance.  Respiratory: Negative for shortness of breath and wheezing  Cardiovascular: Negative for chest pain and palpitations.  Gastrointestinal: Negative for diarrhea, blood in stool, abdominal distention or other pain Genitourinary: Negative for hematuria, flank pain or change in urine volume.  Musculoskeletal: Negative for myalgias or other joint complaints.  Skin: Negative for color change and wound or drainage.  Neurological: Negative for syncope and numbness. other than noted Hematological: Negative for adenopathy. or other swelling Psychiatric/Behavioral: Negative for hallucinations, SI, self-injury, decreased concentration or other worsening agitation.      Objective:   Physical Exam BP 134/82 mmHg  Pulse 61  Temp(Src) 97.5 F (36.4 C) (Oral)  Ht 5\' 5"  (1.651 m)  Wt 181 lb (82.101 kg)  BMI 30.12 kg/m2  SpO2 96% VS noted,  Constitutional: Pt is oriented to person, place, and time. Appears well-developed and well-nourished, in no significant distress Head: Normocephalic and atraumatic.  Right Ear: External ear normal.  Left Ear: External ear normal.  Nose: Nose normal.  Mouth/Throat: Oropharynx is clear and moist.  Eyes: Conjunctivae and EOM are normal. Pupils are equal, round, and reactive to light.  Neck: Normal range of motion. Neck supple. No JVD present. No tracheal deviation present or significant neck LA or mass Cardiovascular: Normal rate, regular rhythm, normal heart sounds and intact distal pulses.   Pulmonary/Chest: Effort normal and breath sounds without rales or wheezing  Abdominal: Soft. Bowel sounds are normal. NT. No HSM  Musculoskeletal: Normal range of motion.  Exhibits no edema.  Lymphadenopathy:  Has no cervical adenopathy.  Neurological: Pt is alert and oriented to person, place, and time. Pt has normal reflexes. No cranial nerve deficit. Motor grossly intact Skin: Skin is warm and dry. No rash noted.  Psychiatric:  Has normal mood and affect. Behavior is normal.     Assessment & Plan:

## 2015-07-04 NOTE — Progress Notes (Signed)
Pre visit review using our clinic review tool, if applicable. No additional management support is needed unless otherwise documented below in the visit note. 

## 2015-07-04 NOTE — Assessment & Plan Note (Signed)

## 2015-07-04 NOTE — Patient Instructions (Addendum)
You had the flu shot today  Please continue all other medications as before, and refills have been done if requested.  Please have the pharmacy call with any other refills you may need.  Please continue your efforts at being more active, low cholesterol diet, and weight control.  You are otherwise up to date with prevention measures today.  Please keep your appointments with your specialists as you may have planned  Your Blood work was [facility] today  Please return in 6 months, or sooner if needed

## 2015-09-04 DIAGNOSIS — H5213 Myopia, bilateral: Secondary | ICD-10-CM | POA: Diagnosis not present

## 2015-09-04 DIAGNOSIS — H5210 Myopia, unspecified eye: Secondary | ICD-10-CM | POA: Diagnosis not present

## 2015-09-11 ENCOUNTER — Other Ambulatory Visit: Payer: Self-pay | Admitting: Internal Medicine

## 2015-09-18 DIAGNOSIS — D649 Anemia, unspecified: Secondary | ICD-10-CM | POA: Diagnosis not present

## 2015-09-18 DIAGNOSIS — N183 Chronic kidney disease, stage 3 (moderate): Secondary | ICD-10-CM | POA: Diagnosis not present

## 2015-09-23 ENCOUNTER — Other Ambulatory Visit: Payer: Self-pay | Admitting: Internal Medicine

## 2015-09-25 DIAGNOSIS — I10 Essential (primary) hypertension: Secondary | ICD-10-CM | POA: Diagnosis not present

## 2015-09-25 DIAGNOSIS — N183 Chronic kidney disease, stage 3 (moderate): Secondary | ICD-10-CM | POA: Diagnosis not present

## 2015-10-25 ENCOUNTER — Other Ambulatory Visit: Payer: Self-pay | Admitting: Internal Medicine

## 2015-11-07 ENCOUNTER — Other Ambulatory Visit: Payer: Self-pay | Admitting: Internal Medicine

## 2016-01-01 ENCOUNTER — Telehealth: Payer: Self-pay

## 2016-01-01 ENCOUNTER — Other Ambulatory Visit (INDEPENDENT_AMBULATORY_CARE_PROVIDER_SITE_OTHER): Payer: Commercial Managed Care - HMO

## 2016-01-01 ENCOUNTER — Encounter: Payer: Self-pay | Admitting: Internal Medicine

## 2016-01-01 ENCOUNTER — Ambulatory Visit (INDEPENDENT_AMBULATORY_CARE_PROVIDER_SITE_OTHER): Payer: Commercial Managed Care - HMO | Admitting: Internal Medicine

## 2016-01-01 VITALS — BP 140/70 | HR 85 | Temp 97.4°F | Resp 20 | Wt 183.0 lb

## 2016-01-01 DIAGNOSIS — E785 Hyperlipidemia, unspecified: Secondary | ICD-10-CM

## 2016-01-01 DIAGNOSIS — R6889 Other general symptoms and signs: Secondary | ICD-10-CM | POA: Diagnosis not present

## 2016-01-01 DIAGNOSIS — L989 Disorder of the skin and subcutaneous tissue, unspecified: Secondary | ICD-10-CM

## 2016-01-01 DIAGNOSIS — I10 Essential (primary) hypertension: Secondary | ICD-10-CM | POA: Diagnosis not present

## 2016-01-01 DIAGNOSIS — Z0001 Encounter for general adult medical examination with abnormal findings: Secondary | ICD-10-CM

## 2016-01-01 DIAGNOSIS — J449 Chronic obstructive pulmonary disease, unspecified: Secondary | ICD-10-CM | POA: Diagnosis not present

## 2016-01-01 LAB — CBC WITH DIFFERENTIAL/PLATELET
BASOS ABS: 0 10*3/uL (ref 0.0–0.1)
Basophils Relative: 0.3 % (ref 0.0–3.0)
EOS ABS: 0.3 10*3/uL (ref 0.0–0.7)
Eosinophils Relative: 2.9 % (ref 0.0–5.0)
HCT: 42.3 % (ref 39.0–52.0)
Hemoglobin: 14.1 g/dL (ref 13.0–17.0)
LYMPHS ABS: 4.6 10*3/uL — AB (ref 0.7–4.0)
LYMPHS PCT: 43.4 % (ref 12.0–46.0)
MCHC: 33.5 g/dL (ref 30.0–36.0)
MCV: 96.3 fl (ref 78.0–100.0)
MONO ABS: 0.9 10*3/uL (ref 0.1–1.0)
Monocytes Relative: 8.6 % (ref 3.0–12.0)
NEUTROS ABS: 4.7 10*3/uL (ref 1.4–7.7)
NEUTROS PCT: 44.8 % (ref 43.0–77.0)
PLATELETS: 268 10*3/uL (ref 150.0–400.0)
RBC: 4.39 Mil/uL (ref 4.22–5.81)
RDW: 15.1 % (ref 11.5–15.5)
WBC: 10.5 10*3/uL (ref 4.0–10.5)

## 2016-01-01 LAB — BASIC METABOLIC PANEL
BUN: 29 mg/dL — ABNORMAL HIGH (ref 6–23)
CHLORIDE: 101 meq/L (ref 96–112)
CO2: 29 meq/L (ref 19–32)
CREATININE: 1.95 mg/dL — AB (ref 0.40–1.50)
Calcium: 9.2 mg/dL (ref 8.4–10.5)
GFR: 34.59 mL/min — ABNORMAL LOW (ref >60.00)
Glucose, Bld: 95 mg/dL (ref 70–99)
POTASSIUM: 4 meq/L (ref 3.5–5.1)
Sodium: 139 mEq/L (ref 135–145)

## 2016-01-01 LAB — HEPATIC FUNCTION PANEL
ALK PHOS: 82 U/L (ref 39–117)
ALT: 9 U/L (ref 0–53)
AST: 13 U/L (ref 0–37)
Albumin: 3.8 g/dL (ref 3.5–5.2)
BILIRUBIN DIRECT: 0.1 mg/dL (ref 0.0–0.3)
TOTAL PROTEIN: 7 g/dL (ref 6.0–8.3)
Total Bilirubin: 0.3 mg/dL (ref 0.2–1.2)

## 2016-01-01 LAB — LIPID PANEL
Cholesterol: 123 mg/dL (ref 0–200)
HDL: 35.8 mg/dL — AB (ref >39.00)
LDL Cholesterol: 61 mg/dL (ref 0–99)
NONHDL: 86.96
Total CHOL/HDL Ratio: 3
Triglycerides: 129 mg/dL (ref 0.0–149.0)
VLDL: 25.8 mg/dL (ref 0.0–40.0)

## 2016-01-01 LAB — URINALYSIS, ROUTINE W REFLEX MICROSCOPIC
Bilirubin Urine: NEGATIVE
Hgb urine dipstick: NEGATIVE
Ketones, ur: NEGATIVE
Leukocytes, UA: NEGATIVE
Nitrite: NEGATIVE
PH: 6 (ref 5.0–8.0)
RBC / HPF: NONE SEEN (ref 0–?)
SPECIFIC GRAVITY, URINE: 1.01 (ref 1.000–1.030)
URINE GLUCOSE: NEGATIVE
UROBILINOGEN UA: 0.2 (ref 0.0–1.0)

## 2016-01-01 LAB — TSH: TSH: 2.05 u[IU]/mL (ref 0.35–4.50)

## 2016-01-01 NOTE — Progress Notes (Signed)
Subjective:    Patient ID: [name], male    DOB: [birth year], 82 y.o.   MRN: [id number]  HPI  Here for wellness and f/u;  Overall doing ok;  Pt denies Chest pain, worsening SOB, DOE, wheezing, orthopnea, PND, worsening LE edema, palpitations, dizziness or syncope.  Pt denies neurological change such as new headache, facial or extremity weakness.  Pt denies polydipsia, polyuria, or low sugar symptoms. Pt states overall good compliance with treatment and medications, good tolerability, and has been trying to follow appropriate diet.  Pt denies worsening depressive symptoms, suicidal ideation or panic. No fever, night sweats, wt loss, loss of appetite, or other constitutional symptoms.  Pt states good ability with ADL's, has low fall risk, home safety reviewed and adequate, no other significant changes in hearing or vision, and only occasionally active with exercise.  Does have a skin lesion right forehead enlarging, asks for derm referral,  Also sees Dr Posey Pronto for renal, stale renal fxn.  Past Medical History  Diagnosis Date  . ALLERGIC RHINITIS 09/29/2007  . BACK PAIN 01/31/2009  . BENIGN PROSTATIC HYPERTROPHY 05/12/2007  . CHEST PAIN 01/31/2008  . CHRONIC OBSTRUCTIVE PULMONARY DISEASE, ACUTE EXACERBATION 08/17/2007  . CONJUNCTIVITIS, ALLERGIC, CHRONIC 03/24/2010  . CONSTIPATION 02/19/2010  . COPD 05/12/2007  . GERD 02/23/2010  . GOUT 07/04/2010  . HYPERLIPIDEMIA 05/12/2007  . HYPERTENSION 05/12/2007  . NEPHROLITHIASIS, HX OF 08/17/2007  . OBESITY 05/12/2007  . PEPTIC ULCER DISEASE 05/12/2007  . PERIPHERAL VASCULAR DISEASE 05/12/2007  . Personal history of unspecified circulatory disease 05/12/2007  . PLANTAR FASCIITIS, RIGHT 09/27/2008  . RENAL CELL CANCER 05/12/2007  . RENAL INSUFFICIENCY 08/17/2007  . SKIN LESION 11/07/2010  . VERTIGO 09/29/2007  . WRIST PAIN, RIGHT 02/26/2009  . History of CVA (cerebrovascular accident) 05/03/2011   Past Surgical History  Procedure Laterality Date  . Cataract  extraction    . Ear surgury    . Nephroureterectomy    . Nephrectomy    . S.p left knee replacement  10/08  . S/p bilat fem pop bypass      reports that he has been smoking.  He does not have any smokeless tobacco history on file. He reports that he does not drink alcohol or use illicit drugs. family history includes Alcohol abuse in his brother; Cancer in his daughter, mother, other, and other; Heart disease in his brother. Allergies  Allergen Reactions  . Sulfonamide Derivatives     REACTION: rash   Current Outpatient Prescriptions on File Prior to Visit  Medication Sig Dispense Refill  . allopurinol (ZYLOPRIM) 100 MG tablet TAKE 1 TABLET EVERY DAY 90 tablet 3  . amLODipine (NORVASC) 5 MG tablet TAKE 1 TABLET EVERY DAY 90 tablet 3  . aspirin 325 MG tablet Take 325 mg by mouth daily.      . calcium-vitamin D (OSCAL WITH D) 500-200 MG-UNIT per tablet Take 1 tablet by mouth daily.      . cetirizine (ZYRTEC) 10 MG tablet Take 10 mg by mouth daily as needed.     . cyclobenzaprine (FLEXERIL) 5 MG tablet Take 5 mg by mouth 3 (three) times daily as needed.      . diphenhydrAMINE (BENADRYL) 25 mg capsule Take 25 mg by mouth daily as needed.    . hydrochlorothiazide (HYDRODIURIL) 25 MG tablet TAKE 1 TABLET EVERY DAY 90 tablet 3  . indomethacin (INDOCIN) 50 MG capsule Take 50 mg by mouth 3 (three) times daily with meals. For acute gout only    .  meclizine (ANTIVERT) 12.5 MG tablet TAKE 1 TABLET FOUR TIMES DAILY AS NEEDED 180 tablet 0  . omeprazole (PRILOSEC) 20 MG capsule TAKE 1 CAPSULE EVERY DAY 90 capsule 0  . polyethylene glycol powder (MIRALAX) powder Take 17 g by mouth daily as needed.     . pravastatin (PRAVACHOL) 40 MG tablet TAKE 1 TABLET EVERY EVENING 90 tablet 3  . fluticasone (FLONASE) 50 MCG/ACT nasal spray Place 2 sprays into the nose daily. (Patient not taking: Reported on 01/01/2016) 16 g 4   No current facility-administered medications on file prior to visit.   Review of  Systems Constitutional: Negative for increased diaphoresis, or other activity, appetite or siginficant weight change other than noted HENT: Negative for worsening hearing loss, ear pain, facial swelling, mouth sores and neck stiffness.   Eyes: Negative for other worsening pain, redness or visual disturbance.  Respiratory: Negative for choking or stridor Cardiovascular: Negative for other chest pain and palpitations.  Gastrointestinal: Negative for worsening diarrhea, blood in stool, or abdominal distention Genitourinary: Negative for hematuria, flank pain or change in urine volume.  Musculoskeletal: Negative for myalgias or other joint complaints.  Skin: Negative for other color change and wound or drainage.  Neurological: Negative for syncope and numbness. other than noted Hematological: Negative for adenopathy. or other swelling Psychiatric/Behavioral: Negative for hallucinations, SI, self-injury, decreased concentration or other worsening agitation.      Objective:   Physical Exam BP 140/70 mmHg  Pulse 85  Temp(Src) 97.4 F (36.3 C) (Oral)  Resp 20  Wt 183 lb (83.008 kg)  SpO2 96% VS noted,  Constitutional: Pt is oriented to person, place, and time. Appears well-developed and well-nourished, in no significant distress Head: Normocephalic and atraumatic  Eyes: Conjunctivae and EOM are normal. Pupils are equal, round, and reactive to light Right Ear: External ear normal.  Left Ear: External ear normal Nose: Nose normal.  Mouth/Throat: Oropharynx is clear and moist  Neck: Normal range of motion. Neck supple. No JVD present. No tracheal deviation present or significant neck LA or mass Cardiovascular: Normal rate, regular rhythm, normal heart sounds and intact distal pulses.  Pulmonary/Chest: Effort normal and breath sounds without rales or wheezing  Abdominal: Soft. Bowel sounds are normal. NT. No HSM  Musculoskeletal: Normal range of motion. Exhibits no edema Lymphadenopathy: Has  no cervical adenopathy.  Neurological: Pt is alert and oriented to person, place, and time. Pt has normal reflexes. No cranial nerve deficit. Motor grossly intact Skin: Skin is warm and dry. No rash noted or new ulcers but has 1/2 slightly raised tan lesion right forehead Psychiatric:  Has normal mood and affect. Behavior is normal.     Assessment & Plan:

## 2016-01-01 NOTE — Progress Notes (Signed)
Pre visit review using our clinic review tool, if applicable. No additional management support is needed unless otherwise documented below in the visit note. 

## 2016-01-01 NOTE — Patient Instructions (Signed)
Please continue all other medications as before, and refills have been done if requested.  Please have the pharmacy call with any other refills you may need.  Please continue your efforts at being more active, low cholesterol diet, and weight control.  You are otherwise up to date with prevention measures today.  Please keep your appointments with your specialists as you may have planned  You will be contacted regarding the referral for: Dr Allyson Sabal  Please go to the LAB in the Basement (turn left off the elevator) for the tests to be done today  You will be contacted by phone if any changes need to be made immediately.  Otherwise, you will receive a letter about your results with an explanation, but please check with MyChart first.  Please remember to sign up for MyChart if you have not done so, as this will be important to you in the future with finding out test results, communicating by private email, and scheduling acute appointments online when needed.  Please return in 6 months, or sooner if needed

## 2016-01-02 ENCOUNTER — Encounter: Payer: Self-pay | Admitting: Internal Medicine

## 2016-01-02 NOTE — Telephone Encounter (Signed)
A user error has taken place.

## 2016-01-04 DIAGNOSIS — L989 Disorder of the skin and subcutaneous tissue, unspecified: Secondary | ICD-10-CM | POA: Insufficient documentation

## 2016-01-04 NOTE — Assessment & Plan Note (Signed)

## 2016-01-04 NOTE — Assessment & Plan Note (Signed)
stable overall by history and exam, recent data reviewed with pt, and pt to continue medical treatment as before,  to f/u any worsening symptoms or concerns ' BP Readings from Last 3 Encounters:  01/01/16 140/70  07/04/15 134/82  01/01/15 118/64

## 2016-01-04 NOTE — Assessment & Plan Note (Addendum)
Enlarging right forehead, for referral dermatology, cant r/o malignancy  In addition to the time spent performing CPE, I spent an additional 40 minutes face to face,in which greater than 50% of this time was spent in counseling and coordination of care for patient's acute illness as documented.

## 2016-01-04 NOTE — Assessment & Plan Note (Signed)
stable overall by history and exam, recent data reviewed with pt, and pt to continue medical treatment as before,  to f/u any worsening symptoms or concerns Lab Results  Component Value Date   LDLCALC 61 01/01/2016

## 2016-01-04 NOTE — Assessment & Plan Note (Signed)
stable overall by history and exam, recent data reviewed with pt, and pt to continue medical treatment as before,  to f/u any worsening symptoms or concerns SpO2 Readings from Last 3 Encounters:  01/01/16 96%  07/04/15 96%  01/01/15 96%

## 2016-01-13 ENCOUNTER — Other Ambulatory Visit: Payer: Self-pay | Admitting: Internal Medicine

## 2016-01-22 DIAGNOSIS — L814 Other melanin hyperpigmentation: Secondary | ICD-10-CM | POA: Diagnosis not present

## 2016-01-22 DIAGNOSIS — D1801 Hemangioma of skin and subcutaneous tissue: Secondary | ICD-10-CM | POA: Diagnosis not present

## 2016-01-22 DIAGNOSIS — L820 Inflamed seborrheic keratosis: Secondary | ICD-10-CM | POA: Diagnosis not present

## 2016-01-22 DIAGNOSIS — C44319 Basal cell carcinoma of skin of other parts of face: Secondary | ICD-10-CM | POA: Diagnosis not present

## 2016-03-16 DIAGNOSIS — N183 Chronic kidney disease, stage 3 (moderate): Secondary | ICD-10-CM | POA: Diagnosis not present

## 2016-03-17 ENCOUNTER — Other Ambulatory Visit: Payer: Self-pay | Admitting: Internal Medicine

## 2016-03-23 DIAGNOSIS — N2581 Secondary hyperparathyroidism of renal origin: Secondary | ICD-10-CM | POA: Diagnosis not present

## 2016-03-23 DIAGNOSIS — I10 Essential (primary) hypertension: Secondary | ICD-10-CM | POA: Diagnosis not present

## 2016-03-23 DIAGNOSIS — N183 Chronic kidney disease, stage 3 (moderate): Secondary | ICD-10-CM | POA: Diagnosis not present

## 2016-03-24 DIAGNOSIS — L821 Other seborrheic keratosis: Secondary | ICD-10-CM | POA: Diagnosis not present

## 2016-03-24 DIAGNOSIS — Z85828 Personal history of other malignant neoplasm of skin: Secondary | ICD-10-CM | POA: Diagnosis not present

## 2016-04-01 ENCOUNTER — Other Ambulatory Visit: Payer: Self-pay | Admitting: Internal Medicine

## 2016-05-05 ENCOUNTER — Other Ambulatory Visit: Payer: Self-pay | Admitting: Internal Medicine

## 2016-05-09 ENCOUNTER — Ambulatory Visit (INDEPENDENT_AMBULATORY_CARE_PROVIDER_SITE_OTHER): Payer: Commercial Managed Care - HMO | Admitting: Family Medicine

## 2016-05-09 ENCOUNTER — Encounter: Payer: Self-pay | Admitting: Family Medicine

## 2016-05-09 VITALS — BP 132/60 | HR 65 | Temp 97.4°F | Resp 16 | Ht 65.0 in | Wt 179.4 lb

## 2016-05-09 DIAGNOSIS — H6123 Impacted cerumen, bilateral: Secondary | ICD-10-CM

## 2016-05-09 NOTE — Patient Instructions (Signed)
     IF you received an x-ray today, you will receive an invoice from [facility]. Please contact [facility] at [phone] with questions or concerns regarding your invoice.   IF you received labwork today, you will receive an invoice from [facility]. Please contact [facility] at [phone] with questions or concerns regarding your invoice.   Our billing staff will not be able to assist you with questions regarding bills from these companies.  You will be contacted with the lab results as soon as they are available. The fastest way to get your results is to activate your My Chart account. Instructions are located on the last page of this paperwork. If you have not heard from us regarding the results in 2 weeks, please contact this office.      

## 2016-05-09 NOTE — Progress Notes (Signed)
Is an 82 year old patient with hearing impairment who uses hearing aids. He was at his cardiologist's office this morning and found to have bilateral earwax impaction.  Patient has no ear pain or dizziness. He's had no fever or otorrhea.   Objective:BP 132/60   Pulse 65   Temp 97.4 F (36.3 C) (Oral)   Resp 16   Ht 5\' 5"  (1.651 m)   Wt 179 lb 6.4 oz (81.4 kg)   SpO2 95%   BMI 29.85 kg/m  Patient is obviously hard of hearing. Examination both ear canals shows moderate amount of wax impacted on the tympanic membranes.  The nurse lavaged the ear canals clear and patient was sent back to his audiologist.  Assessment: Simple cerumen impaction bilaterally  Plan: Follow-up as needed  Signed, Carola Frost.D.

## 2016-06-10 ENCOUNTER — Other Ambulatory Visit: Payer: Self-pay | Admitting: Internal Medicine

## 2016-06-22 ENCOUNTER — Telehealth: Payer: Self-pay | Admitting: Internal Medicine

## 2016-06-22 DIAGNOSIS — Z23 Encounter for immunization: Secondary | ICD-10-CM

## 2016-06-22 NOTE — Telephone Encounter (Signed)
Pt would like hep c testing added to his blood work. He is wanting to come tomorrow.

## 2016-06-23 ENCOUNTER — Other Ambulatory Visit: Payer: Self-pay | Admitting: Internal Medicine

## 2016-06-23 NOTE — Telephone Encounter (Signed)
Patients wife called back stating that patient did not call in for this nor did the patients wife. She is not sure where this came from. Order cancelled.

## 2016-06-23 NOTE — Telephone Encounter (Signed)
Lab order placed for patient. Patient aware.

## 2016-06-24 ENCOUNTER — Ambulatory Visit: Payer: Commercial Managed Care - HMO | Admitting: Internal Medicine

## 2016-07-08 ENCOUNTER — Encounter: Payer: Self-pay | Admitting: Internal Medicine

## 2016-07-08 ENCOUNTER — Other Ambulatory Visit (INDEPENDENT_AMBULATORY_CARE_PROVIDER_SITE_OTHER): Payer: Commercial Managed Care - HMO

## 2016-07-08 ENCOUNTER — Ambulatory Visit (INDEPENDENT_AMBULATORY_CARE_PROVIDER_SITE_OTHER): Payer: Commercial Managed Care - HMO | Admitting: Internal Medicine

## 2016-07-08 VITALS — BP 140/78 | HR 78 | Temp 98.0°F | Resp 20 | Wt 172.0 lb

## 2016-07-08 DIAGNOSIS — J449 Chronic obstructive pulmonary disease, unspecified: Secondary | ICD-10-CM | POA: Diagnosis not present

## 2016-07-08 DIAGNOSIS — Z23 Encounter for immunization: Secondary | ICD-10-CM

## 2016-07-08 DIAGNOSIS — N183 Chronic kidney disease, stage 3 unspecified: Secondary | ICD-10-CM

## 2016-07-08 DIAGNOSIS — E785 Hyperlipidemia, unspecified: Secondary | ICD-10-CM

## 2016-07-08 DIAGNOSIS — I10 Essential (primary) hypertension: Secondary | ICD-10-CM

## 2016-07-08 LAB — BASIC METABOLIC PANEL
BUN: 31 mg/dL — AB (ref 6–23)
CALCIUM: 9.4 mg/dL (ref 8.4–10.5)
CO2: 30 meq/L (ref 19–32)
CREATININE: 2.13 mg/dL — AB (ref 0.40–1.50)
Chloride: 101 mEq/L (ref 96–112)
GFR: 31.2 mL/min — ABNORMAL LOW (ref >60.00)
GLUCOSE: 106 mg/dL — AB (ref 70–99)
Potassium: 3.9 mEq/L (ref 3.5–5.1)
Sodium: 139 mEq/L (ref 135–145)

## 2016-07-08 NOTE — Progress Notes (Signed)
Subjective:    Patient ID: [name], male    DOB: Aug 13, 1925, 82 y.o.   MRN: [id number]  HPI  Here to f/u; overall doing ok,  Pt denies chest pain, increasing sob or doe, wheezing, orthopnea, PND, increased LE swelling, palpitations, dizziness or syncope.  Pt denies new neurological symptoms such as new headache, or facial or extremity weakness or numbness.  Pt denies polydipsia, polyuria, or low sugar episode.   Pt denies new neurological symptoms such as new headache, or facial or extremity weakness or numbness.   Pt states overall good compliance with meds, mostly trying to follow appropriate diet, with wt overall stable,  but little exercise however.  2 wks nasal congestion without fever, now resolved.  Asks for flu shot Did have some mild back strain for a few days after BM, but no other bowel or bladder change, fever, wt loss,  worsening LE pain/numbness/weakness, gait change or falls. No other changes to hx Past Medical History:  Diagnosis Date  . ALLERGIC RHINITIS 09/29/2007  . BACK PAIN 01/31/2009  . BENIGN PROSTATIC HYPERTROPHY 05/12/2007  . CHEST PAIN 01/31/2008  . CHRONIC OBSTRUCTIVE PULMONARY DISEASE, ACUTE EXACERBATION 08/17/2007  . CONJUNCTIVITIS, ALLERGIC, CHRONIC 03/24/2010  . CONSTIPATION 02/19/2010  . COPD 05/12/2007  . GERD 02/23/2010  . GOUT 07/04/2010  . History of CVA (cerebrovascular accident) 05/03/2011  . HYPERLIPIDEMIA 05/12/2007  . HYPERTENSION 05/12/2007  . NEPHROLITHIASIS, HX OF 08/17/2007  . OBESITY 05/12/2007  . PEPTIC ULCER DISEASE 05/12/2007  . PERIPHERAL VASCULAR DISEASE 05/12/2007  . Personal history of unspecified circulatory disease 05/12/2007  . PLANTAR FASCIITIS, RIGHT 09/27/2008  . RENAL CELL CANCER 05/12/2007  . RENAL INSUFFICIENCY 08/17/2007  . SKIN LESION 11/07/2010  . VERTIGO 09/29/2007  . WRIST PAIN, RIGHT 02/26/2009   Past Surgical History:  Procedure Laterality Date  . CATARACT EXTRACTION    . ear surgury    . NEPHRECTOMY    . NEPHROURETERECTOMY      . s.p left knee replacement  10/08  . s/p bilat fem pop bypass      reports that he has been smoking.  He does not have any smokeless tobacco history on file. He reports that he does not drink alcohol or use drugs. family history includes Alcohol abuse in his brother; Cancer in his daughter, mother, other, and other; Heart disease in his brother. Allergies  Allergen Reactions  . Sulfonamide Derivatives     REACTION: rash   Current Outpatient Prescriptions on File Prior to Visit  Medication Sig Dispense Refill  . allopurinol (ZYLOPRIM) 100 MG tablet TAKE 1 TABLET EVERY DAY 90 tablet 3  . amLODipine (NORVASC) 5 MG tablet TAKE 1 TABLET EVERY DAY 90 tablet 3  . aspirin 325 MG tablet Take 325 mg by mouth daily.      . cetirizine (ZYRTEC) 10 MG tablet Take 10 mg by mouth daily as needed.     . cyclobenzaprine (FLEXERIL) 5 MG tablet Take 5 mg by mouth 3 (three) times daily as needed.      . diphenhydrAMINE (BENADRYL) 25 mg capsule Take 25 mg by mouth daily as needed.    . hydrochlorothiazide (HYDRODIURIL) 25 MG tablet TAKE 1 TABLET EVERY DAY 90 tablet 3  . indomethacin (INDOCIN) 50 MG capsule Take 50 mg by mouth 3 (three) times daily with meals. For acute gout only    . meclizine (ANTIVERT) 12.5 MG tablet TAKE 1 TABLET FOUR TIMES DAILY AS NEEDED 180 tablet 0  . omeprazole (PRILOSEC) 20  MG capsule TAKE 1 CAPSULE EVERY DAY 90 capsule 1  . polyethylene glycol powder (MIRALAX) powder Take 17 g by mouth daily as needed.     . pravastatin (PRAVACHOL) 40 MG tablet TAKE 1 TABLET EVERY EVENING 90 tablet 3   No current facility-administered medications on file prior to visit.    Review of Systems  Constitutional: Negative for unusual diaphoresis or night sweats HENT: Negative for ear swelling or discharge Eyes: Negative for worsening visual haziness  Respiratory: Negative for choking and stridor.   Gastrointestinal: Negative for distension or worsening eructation Genitourinary: Negative for  retention or change in urine volume.  Musculoskeletal: Negative for other MSK pain or swelling Skin: Negative for color change and worsening wound Neurological: Negative for tremors and numbness other than noted  Psychiatric/Behavioral: Negative for decreased concentration or agitation other than above   All other system neg per pt    Objective:   Physical Exam BP 140/78   Pulse 78   Temp 98 F (36.7 C) (Oral)   Resp 20   Wt 172 lb (78 kg)   SpO2 97%   BMI 28.62 kg/m  VS noted,  Constitutional: Pt appears in no apparent distress HENT: Head: NCAT.  Right Ear: External ear normal.  Left Ear: External ear normal.  Eyes: . Pupils are equal, round, and reactive to light. Conjunctivae and EOM are normal Neck: Normal range of motion. Neck supple.  Cardiovascular: Normal rate and regular rhythm.   Pulmonary/Chest: Effort normal and breath sounds decreased without rales or wheezing.  Abd:  Soft, NT, ND, + BS Neurological: Pt is alert. Not confused , motor grossly intact Skin: Skin is warm. No rash, no LE edema Psychiatric: Pt behavior is normal. No agitation.  No spine tender No other new exam findings       Assessment & Plan:

## 2016-07-08 NOTE — Progress Notes (Signed)
Pre visit review using our clinic review tool, if applicable. No additional management support is needed unless otherwise documented below in the visit note. 

## 2016-07-08 NOTE — Patient Instructions (Addendum)

## 2016-07-09 LAB — HEPATITIS C ANTIBODY: HCV AB: NEGATIVE

## 2016-07-12 NOTE — Assessment & Plan Note (Signed)
stable overall by history and exam, recent data reviewed with pt, and pt to continue medical treatment as before,  to f/u any worsening symptoms or concerns @LASTSAO2(3)@  

## 2016-07-12 NOTE — Assessment & Plan Note (Signed)
stable overall by history and exam, recent data reviewed with pt, and pt to continue medical treatment as before,  to f/u any worsening symptoms or concerns BP Readings from Last 3 Encounters:  07/08/16 140/78  05/09/16 132/60  01/01/16 140/70

## 2016-07-12 NOTE — Assessment & Plan Note (Signed)
stable overall by history and exam, recent data reviewed with pt, and pt to continue medical treatment as before,  to f/u any worsening symptoms or concerns Lab Results  Component Value Date   LDLCALC 61 01/01/2016

## 2016-07-12 NOTE — Assessment & Plan Note (Signed)
stable overall by history and exam, recent data reviewed with pt, and pt to continue medical treatment as before,  to f/u any worsening symptoms or concerns Lab Results  Component Value Date   CREATININE 2.13 (H) 07/08/2016

## 2016-08-04 ENCOUNTER — Other Ambulatory Visit: Payer: Self-pay | Admitting: Internal Medicine

## 2016-09-15 DIAGNOSIS — N184 Chronic kidney disease, stage 4 (severe): Secondary | ICD-10-CM | POA: Diagnosis not present

## 2016-09-15 DIAGNOSIS — N2581 Secondary hyperparathyroidism of renal origin: Secondary | ICD-10-CM | POA: Diagnosis not present

## 2016-09-23 DIAGNOSIS — D631 Anemia in chronic kidney disease: Secondary | ICD-10-CM | POA: Diagnosis not present

## 2016-09-23 DIAGNOSIS — N2581 Secondary hyperparathyroidism of renal origin: Secondary | ICD-10-CM | POA: Diagnosis not present

## 2016-09-23 DIAGNOSIS — I10 Essential (primary) hypertension: Secondary | ICD-10-CM | POA: Diagnosis not present

## 2016-09-23 DIAGNOSIS — K5909 Other constipation: Secondary | ICD-10-CM | POA: Diagnosis not present

## 2016-09-23 DIAGNOSIS — N184 Chronic kidney disease, stage 4 (severe): Secondary | ICD-10-CM | POA: Diagnosis not present

## 2016-12-29 ENCOUNTER — Other Ambulatory Visit: Payer: Self-pay

## 2016-12-29 MED ORDER — AMLODIPINE BESYLATE 5 MG PO TABS: 5.0000 mg | ORAL_TABLET | Freq: Every day | ORAL | 1 refills | Status: DC

## 2017-01-05 ENCOUNTER — Ambulatory Visit (INDEPENDENT_AMBULATORY_CARE_PROVIDER_SITE_OTHER): Payer: Medicare HMO | Admitting: Internal Medicine

## 2017-01-05 ENCOUNTER — Encounter: Payer: Self-pay | Admitting: Internal Medicine

## 2017-01-05 ENCOUNTER — Ambulatory Visit (INDEPENDENT_AMBULATORY_CARE_PROVIDER_SITE_OTHER)
Admission: RE | Admit: 2017-01-05 | Discharge: 2017-01-05 | Disposition: A | Discharge status: Home / Self Care | Payer: Medicare HMO | Source: Ambulatory Visit | Attending: Internal Medicine | Admitting: Internal Medicine

## 2017-01-05 ENCOUNTER — Other Ambulatory Visit (INDEPENDENT_AMBULATORY_CARE_PROVIDER_SITE_OTHER): Payer: Medicare HMO

## 2017-01-05 VITALS — BP 136/70 | HR 64 | Ht 65.0 in | Wt 171.0 lb

## 2017-01-05 DIAGNOSIS — K5900 Constipation, unspecified: Secondary | ICD-10-CM | POA: Diagnosis not present

## 2017-01-05 DIAGNOSIS — R05 Cough: Secondary | ICD-10-CM

## 2017-01-05 DIAGNOSIS — Z0000 Encounter for general adult medical examination without abnormal findings: Secondary | ICD-10-CM | POA: Diagnosis not present

## 2017-01-05 DIAGNOSIS — R011 Cardiac murmur, unspecified: Secondary | ICD-10-CM | POA: Diagnosis not present

## 2017-01-05 DIAGNOSIS — N183 Chronic kidney disease, stage 3 unspecified: Secondary | ICD-10-CM

## 2017-01-05 DIAGNOSIS — R059 Cough, unspecified: Secondary | ICD-10-CM

## 2017-01-05 LAB — URINALYSIS, ROUTINE W REFLEX MICROSCOPIC
BILIRUBIN URINE: NEGATIVE
Hgb urine dipstick: NEGATIVE
KETONES UR: NEGATIVE
Leukocytes, UA: NEGATIVE
Nitrite: NEGATIVE
PH: 6 (ref 5.0–8.0)
RBC / HPF: NONE SEEN (ref 0–?)
SPECIFIC GRAVITY, URINE: 1.02 (ref 1.000–1.030)
UROBILINOGEN UA: 0.2 (ref 0.0–1.0)
Urine Glucose: NEGATIVE

## 2017-01-05 LAB — HEPATIC FUNCTION PANEL
ALK PHOS: 79 U/L (ref 39–117)
ALT: 8 U/L (ref 0–53)
AST: 12 U/L (ref 0–37)
Albumin: 3.8 g/dL (ref 3.5–5.2)
BILIRUBIN DIRECT: 0.1 mg/dL (ref 0.0–0.3)
BILIRUBIN TOTAL: 0.4 mg/dL (ref 0.2–1.2)
TOTAL PROTEIN: 6.9 g/dL (ref 6.0–8.3)

## 2017-01-05 LAB — CBC WITH DIFFERENTIAL/PLATELET
BASOS ABS: 0.1 10*3/uL (ref 0.0–0.1)
BASOS PCT: 0.8 % (ref 0.0–3.0)
EOS PCT: 2.6 % (ref 0.0–5.0)
Eosinophils Absolute: 0.3 10*3/uL (ref 0.0–0.7)
HEMATOCRIT: 41.2 % (ref 39.0–52.0)
Hemoglobin: 13.7 g/dL (ref 13.0–17.0)
LYMPHS ABS: 4.8 10*3/uL — AB (ref 0.7–4.0)
LYMPHS PCT: 41.1 % (ref 12.0–46.0)
MCHC: 33.2 g/dL (ref 30.0–36.0)
MCV: 97.6 fl (ref 78.0–100.0)
MONOS PCT: 8.7 % (ref 3.0–12.0)
Monocytes Absolute: 1 10*3/uL (ref 0.1–1.0)
NEUTROS ABS: 5.4 10*3/uL (ref 1.4–7.7)
NEUTROS PCT: 46.8 % (ref 43.0–77.0)
PLATELETS: 266 10*3/uL (ref 150.0–400.0)
RBC: 4.22 Mil/uL (ref 4.22–5.81)
RDW: 14.8 % (ref 11.5–15.5)
WBC: 11.6 10*3/uL — ABNORMAL HIGH (ref 4.0–10.5)

## 2017-01-05 LAB — BASIC METABOLIC PANEL
BUN: 34 mg/dL — ABNORMAL HIGH (ref 6–23)
CALCIUM: 9.5 mg/dL (ref 8.4–10.5)
CO2: 28 meq/L (ref 19–32)
CREATININE: 2.13 mg/dL — AB (ref 0.40–1.50)
Chloride: 104 mEq/L (ref 96–112)
GFR: 31.17 mL/min — AB (ref >60.00)
Glucose, Bld: 97 mg/dL (ref 70–99)
Potassium: 4.2 mEq/L (ref 3.5–5.1)
SODIUM: 138 meq/L (ref 135–145)

## 2017-01-05 LAB — TSH: TSH: 2.18 u[IU]/mL (ref 0.35–4.50)

## 2017-01-05 LAB — LIPID PANEL
CHOL/HDL RATIO: 3
Cholesterol: 127 mg/dL (ref 0–200)
HDL: 38 mg/dL — AB (ref >39.00)
LDL Cholesterol: 63 mg/dL (ref 0–99)
NONHDL: 89
Triglycerides: 132 mg/dL (ref 0.0–149.0)
VLDL: 26.4 mg/dL (ref 0.0–40.0)

## 2017-01-05 MED ORDER — ZOSTER VAC RECOMB ADJUVANTED 50 MCG/0.5ML IM SUSR: 0.5000 mL | Freq: Once | INTRAMUSCULAR | 1 refills | Status: AC

## 2017-01-05 NOTE — Assessment & Plan Note (Signed)

## 2017-01-05 NOTE — Assessment & Plan Note (Signed)
Seeing renal regularly.

## 2017-01-05 NOTE — Patient Instructions (Addendum)
The Shingrix was sent to your pharmacy, but ask about the copay before you get it.  Please continue all other medications as before, and refills have been done if requested.  Please have the pharmacy call with any other refills you may need.  Please continue your efforts at being more active, low cholesterol diet, and weight control.  You are otherwise up to date with prevention measures today.  Please keep your appointments with your specialists as you may have planned - kidney doctor  You will be contacted regarding the referral for: Echocardiogram  Please go to the XRAY Department in the Basement (go straight as you get off the elevator) for the x-ray testing  Please go to the LAB in the Basement (turn left off the elevator) for the tests to be done today  You will be contacted by phone if any changes need to be made immediately.  Otherwise, you will receive a letter about your results with an explanation, but please check with MyChart first.  Please remember to sign up for MyChart if you have not done so, as this will be important to you in the future with finding out test results, communicating by private email, and scheduling acute appointments online when needed.  Please return in 1 year for your yearly visit, or sooner if needed, with Lab testing done 3-5 days before

## 2017-01-05 NOTE — Assessment & Plan Note (Signed)
No wheezing, for cxr, urged pt to finally quit smoking but he is not moitivated

## 2017-01-05 NOTE — Progress Notes (Signed)
Subjective:    Patient ID: [name], male    DOB: 1925/02/24, 83 y.o.   MRN: [id number]  HPI  Here for wellness and f/u with wife;  Overall doing ok;  Pt denies Chest pain, worsening SOB, DOE, wheezing, orthopnea, PND, worsening LE edema, palpitations, dizziness or syncope, but has a ? Worsening chronic cough, long hx of smoking..  Pt denies neurological change such as new headache, facial or extremity weakness.  Pt denies polydipsia, polyuria, or low sugar symptoms. Pt states overall good compliance with treatment and medications, good tolerability, and has been trying to follow appropriate diet.  Pt denies worsening depressive symptoms, suicidal ideation or panic. No fever, night sweats, wt loss, loss of appetite, or other constitutional symptoms.  Pt states good ability with ADL's, has low fall risk, home safety reviewed and adequate, no other significant changes in hearing or vision, and only occasionally active with exercise.  Walks with cane, no recent falls, and seems able to be quite active, just doesn't have the motivation. Seems to "give out" from just walking from the front to back of the house  Has hearing eval tomorrow. Also with constipation recently, has not tried any otc, o/w Denies worsening reflux, abd pain, dysphagia, n/v, bowel change or blood. Past Medical History:  Diagnosis Date  . ALLERGIC RHINITIS 09/29/2007  . BACK PAIN 01/31/2009  . BENIGN PROSTATIC HYPERTROPHY 05/12/2007  . CHEST PAIN 01/31/2008  . CHRONIC OBSTRUCTIVE PULMONARY DISEASE, ACUTE EXACERBATION 08/17/2007  . CONJUNCTIVITIS, ALLERGIC, CHRONIC 03/24/2010  . CONSTIPATION 02/19/2010  . COPD 05/12/2007  . GERD 02/23/2010  . GOUT 07/04/2010  . History of CVA (cerebrovascular accident) 05/03/2011  . HYPERLIPIDEMIA 05/12/2007  . HYPERTENSION 05/12/2007  . NEPHROLITHIASIS, HX OF 08/17/2007  . OBESITY 05/12/2007  . PEPTIC ULCER DISEASE 05/12/2007  . PERIPHERAL VASCULAR DISEASE 05/12/2007  . Personal history of unspecified  circulatory disease 05/12/2007  . PLANTAR FASCIITIS, RIGHT 09/27/2008  . RENAL CELL CANCER 05/12/2007  . RENAL INSUFFICIENCY 08/17/2007  . SKIN LESION 11/07/2010  . VERTIGO 09/29/2007  . WRIST PAIN, RIGHT 02/26/2009   Past Surgical History:  Procedure Laterality Date  . CATARACT EXTRACTION    . ear surgury    . NEPHRECTOMY    . NEPHROURETERECTOMY    . s.p left knee replacement  10/08  . s/p bilat fem pop bypass      reports that he has been smoking.  He has never used smokeless tobacco. He reports that he does not drink alcohol or use drugs. family history includes Alcohol abuse in his brother; Cancer in his daughter, mother, other, and other; Heart disease in his brother. Allergies  Allergen Reactions  . Sulfonamide Derivatives     REACTION: rash   Current Outpatient Prescriptions on File Prior to Visit  Medication Sig Dispense Refill  . allopurinol (ZYLOPRIM) 100 MG tablet TAKE 1 TABLET EVERY DAY 90 tablet 3  . amLODipine (NORVASC) 5 MG tablet Take 1 tablet (5 mg total) by mouth daily. 90 tablet 1  . aspirin 325 MG tablet Take 325 mg by mouth daily.      . cetirizine (ZYRTEC) 10 MG tablet Take 10 mg by mouth daily as needed.     . cyclobenzaprine (FLEXERIL) 5 MG tablet Take 5 mg by mouth 3 (three) times daily as needed.      . diphenhydrAMINE (BENADRYL) 25 mg capsule Take 25 mg by mouth daily as needed.    . hydrochlorothiazide (HYDRODIURIL) 25 MG tablet TAKE 1 TABLET EVERY  DAY 90 tablet 3  . indomethacin (INDOCIN) 50 MG capsule Take 50 mg by mouth 3 (three) times daily with meals. For acute gout only    . meclizine (ANTIVERT) 12.5 MG tablet TAKE 1 TABLET FOUR TIMES DAILY AS NEEDED 180 tablet 0  . omeprazole (PRILOSEC) 20 MG capsule TAKE 1 CAPSULE EVERY DAY 90 capsule 1  . polyethylene glycol powder (MIRALAX) powder Take 17 g by mouth daily as needed.     . pravastatin (PRAVACHOL) 40 MG tablet TAKE 1 TABLET EVERY EVENING 90 tablet 3   No current facility-administered medications on  file prior to visit.     Review of Systems Constitutional: Negative for other unusual diaphoresis, sweats, appetite or weight changes HENT: Negative for other worsening hearing loss, ear pain, facial swelling, mouth sores or neck stiffness.   Eyes: Negative for other worsening pain, redness or other visual disturbance.  Respiratory: Negative for other stridor or swelling Cardiovascular: Negative for other palpitations or other chest pain  Gastrointestinal: Negative for worsening diarrhea or loose stools, blood in stool, distention or other pain Genitourinary: Negative for hematuria, flank pain or other change in urine volume.  Musculoskeletal: Negative for myalgias or other joint swelling.  Skin: Negative for other color change, or other wound or worsening drainage.  Neurological: Negative for other syncope or numbness. Hematological: Negative for other adenopathy or swelling Psychiatric/Behavioral: Negative for hallucinations, other worsening agitation, SI, self-injury, or new decreased concentration All other system neg per pt    Objective:   Physical Exam BP 136/70   Pulse 64   Ht 5\' 5"  (1.651 m)   Wt 171 lb (77.6 kg)   SpO2 99%   BMI 28.46 kg/m  VS noted,  Constitutional: Pt is oriented to person, place, and time. Appears well-developed and well-nourished, in no significant distress and comfortable Head: Normocephalic and atraumatic  Eyes: Conjunctivae and EOM are normal. Pupils are equal, round, and reactive to light Right Ear: External ear normal without discharge Left Ear: External ear normal without discharge Nose: Nose without discharge or deformity Mouth/Throat: Oropharynx is without other ulcerations and moist  Neck: Normal range of motion. Neck supple. No JVD present. No tracheal deviation present or significant neck LA or mass Cardiovascular: Normal rate, regular rhythm, normal heart sounds and intact distal pulses. Except for gr 2/6 syst murmur RUSB    Pulmonary/Chest: WOB normal and breath sounds decresed without rales or wheezing  Abdominal: Soft. Bowel sounds are normal. NT. No HSM  Musculoskeletal: Normal range of motion. Exhibits no edema Lymphadenopathy: Has no other cervical adenopathy.  Neurological: Pt is alert and oriented to person, place, and time. Pt has normal reflexes. No cranial nerve deficit. Motor grossly intact, Gait intact Skin: Skin is warm and dry. No rash noted or new ulcerations Psychiatric:  Has normal mood and affect. Behavior is normal without agitation No other exam findings    Assessment & Plan:

## 2017-01-05 NOTE — Assessment & Plan Note (Signed)
For echo - r/o AS

## 2017-01-05 NOTE — Progress Notes (Signed)
Pre visit review using our clinic review tool, if applicable. No additional management support is needed unless otherwise documented below in the visit note. 

## 2017-01-05 NOTE — Assessment & Plan Note (Signed)
To start miralax asd daily

## 2017-01-06 ENCOUNTER — Encounter: Payer: Self-pay | Admitting: Internal Medicine

## 2017-01-06 ENCOUNTER — Other Ambulatory Visit: Payer: Self-pay | Admitting: *Deleted

## 2017-01-06 MED ORDER — AMLODIPINE BESYLATE 5 MG PO TABS: 5.0000 mg | ORAL_TABLET | Freq: Every day | ORAL | 3 refills | Status: DC

## 2017-01-13 ENCOUNTER — Other Ambulatory Visit: Payer: Self-pay

## 2017-01-13 MED ORDER — PRAVASTATIN SODIUM 40 MG PO TABS: 40.0000 mg | ORAL_TABLET | Freq: Every evening | ORAL | 3 refills | Status: DC

## 2017-01-14 ENCOUNTER — Other Ambulatory Visit: Payer: Self-pay

## 2017-01-14 MED ORDER — OMEPRAZOLE 20 MG PO CPDR: 20.0000 mg | DELAYED_RELEASE_CAPSULE | Freq: Every day | ORAL | 3 refills | Status: DC

## 2017-01-14 MED ORDER — HYDROCHLOROTHIAZIDE 25 MG PO TABS: 25.0000 mg | ORAL_TABLET | Freq: Every day | ORAL | 3 refills | Status: DC

## 2017-01-14 MED ORDER — ALLOPURINOL 100 MG PO TABS: 100.0000 mg | ORAL_TABLET | Freq: Every day | ORAL | 3 refills | Status: DC

## 2017-01-14 MED ORDER — MECLIZINE HCL 12.5 MG PO TABS: ORAL_TABLET | ORAL | 0 refills | Status: DC

## 2017-02-05 ENCOUNTER — Other Ambulatory Visit: Payer: Self-pay

## 2017-02-05 ENCOUNTER — Encounter: Payer: Self-pay | Admitting: Internal Medicine

## 2017-02-05 ENCOUNTER — Ambulatory Visit (HOSPITAL_COMMUNITY): Payer: Medicare HMO | Attending: Cardiovascular Disease

## 2017-02-05 DIAGNOSIS — R011 Cardiac murmur, unspecified: Secondary | ICD-10-CM

## 2017-02-05 DIAGNOSIS — I350 Nonrheumatic aortic (valve) stenosis: Secondary | ICD-10-CM

## 2017-02-05 HISTORY — DX: Nonrheumatic aortic (valve) stenosis: I35.0

## 2017-02-08 ENCOUNTER — Encounter: Payer: Self-pay | Admitting: Internal Medicine

## 2017-02-08 ENCOUNTER — Other Ambulatory Visit: Payer: Self-pay | Admitting: Internal Medicine

## 2017-02-08 DIAGNOSIS — I350 Nonrheumatic aortic (valve) stenosis: Secondary | ICD-10-CM

## 2017-02-09 ENCOUNTER — Telehealth: Payer: Self-pay

## 2017-02-09 NOTE — Telephone Encounter (Signed)
Pt's family has been informed and has expressed understanding.

## 2017-02-09 NOTE — Telephone Encounter (Signed)
-----   Message from [name], MD sent at 02/08/2017  8:03 PM EDT ----- Letter sent, cont same tx except  The test results show that your current treatment is OK, except the murmur we heard at your exam has been found on the Echocardiogram to be consistent with Aortic Stenosis, of a moderate degree.  I think you are doing fairly well with this overall, but we would like to have seen per Cardiology to help determine this as well.  I will do the referral, and you should hear from the office as well.[name] to please inform pt though you may need to speak to family as  I think he has very hard of hearing, and I will do referral

## 2017-02-16 ENCOUNTER — Encounter: Payer: Self-pay | Admitting: Cardiology

## 2017-02-16 ENCOUNTER — Ambulatory Visit (INDEPENDENT_AMBULATORY_CARE_PROVIDER_SITE_OTHER): Payer: Medicare HMO | Admitting: Cardiology

## 2017-02-16 VITALS — BP 126/64 | HR 55

## 2017-02-16 DIAGNOSIS — I350 Nonrheumatic aortic (valve) stenosis: Secondary | ICD-10-CM

## 2017-02-16 DIAGNOSIS — I10 Essential (primary) hypertension: Secondary | ICD-10-CM

## 2017-02-16 MED ORDER — ASPIRIN EC 81 MG PO TBEC: 81.0000 mg | DELAYED_RELEASE_TABLET | Freq: Every day | ORAL | 3 refills | Status: DC

## 2017-02-16 NOTE — Progress Notes (Signed)
Electrophysiology Office Note   Date:  02/16/2017   ID:  ZARON ZWIEFELHOFER, DOB 1925-07-28, MRN [id number]  PCP:  [name], MD Primary Electrophysiologist:  [name], MD    Chief Complaint  Patient presents with  . New Patient (Initial Visit)    Aortic stenosis     History of Present Illness: [name] is a 83 y.o. male who is being seen today for the evaluation of heart murmur at the request of [name], MD. Presenting today for electrophysiology evaluation. He is found to have a murmur by his primary physician who ordered an echocardiogram which showed moderate aortic stenosis as well as mild mitral regurgitation.    Today, he denies symptoms of palpitations, chest pain, shortness of breath, orthopnea, PND, lower extremity edema, claudication, dizziness, presyncope, syncope, bleeding, or neurologic sequela. The patient is tolerating medications without difficulties.   Of note the patient is a World War II veteran.  Past Medical History:  Diagnosis Date  . ALLERGIC RHINITIS 09/29/2007  . Aortic stenosis 02/05/2017  . BACK PAIN 01/31/2009  . BENIGN PROSTATIC HYPERTROPHY 05/12/2007  . CHEST PAIN 01/31/2008  . CHRONIC OBSTRUCTIVE PULMONARY DISEASE, ACUTE EXACERBATION 08/17/2007  . CONJUNCTIVITIS, ALLERGIC, CHRONIC 03/24/2010  . CONSTIPATION 02/19/2010  . COPD 05/12/2007  . GERD 02/23/2010  . GOUT 07/04/2010  . History of CVA (cerebrovascular accident) 05/03/2011  . HYPERLIPIDEMIA 05/12/2007  . HYPERTENSION 05/12/2007  . NEPHROLITHIASIS, HX OF 08/17/2007  . OBESITY 05/12/2007  . PEPTIC ULCER DISEASE 05/12/2007  . PERIPHERAL VASCULAR DISEASE 05/12/2007  . Personal history of unspecified circulatory disease 05/12/2007  . PLANTAR FASCIITIS, RIGHT 09/27/2008  . RENAL CELL CANCER 05/12/2007  . RENAL INSUFFICIENCY 08/17/2007  . SKIN LESION 11/07/2010  . VERTIGO 09/29/2007  . WRIST PAIN, RIGHT 02/26/2009   Past Surgical History:  Procedure Laterality Date  . CATARACT  EXTRACTION    . ear surgury    . NEPHRECTOMY    . NEPHROURETERECTOMY    . s.p left knee replacement  10/08  . s/p bilat fem pop bypass       Current Outpatient Prescriptions  Medication Sig Dispense Refill  . allopurinol (ZYLOPRIM) 100 MG tablet Take 1 tablet (100 mg total) by mouth daily. 90 tablet 3  . amLODipine (NORVASC) 5 MG tablet Take 1 tablet (5 mg total) by mouth daily. 90 tablet 3  . aspirin 325 MG tablet Take 325 mg by mouth daily.      . cetirizine (ZYRTEC) 10 MG tablet Take 10 mg by mouth daily as needed.     . cyclobenzaprine (FLEXERIL) 5 MG tablet Take 5 mg by mouth 3 (three) times daily as needed.      . diphenhydrAMINE (BENADRYL) 25 mg capsule Take 25 mg by mouth daily as needed.    . hydrochlorothiazide (HYDRODIURIL) 25 MG tablet Take 1 tablet (25 mg total) by mouth daily. 90 tablet 3  . indomethacin (INDOCIN) 50 MG capsule Take 50 mg by mouth 3 (three) times daily with meals. For acute gout only    . meclizine (ANTIVERT) 12.5 MG tablet TAKE 1 TABLET FOUR TIMES DAILY AS NEEDED 180 tablet 0  . omeprazole (PRILOSEC) 20 MG capsule Take 1 capsule (20 mg total) by mouth daily. 90 capsule 3  . polyethylene glycol powder (MIRALAX) powder Take 17 g by mouth daily as needed.     . pravastatin (PRAVACHOL) 40 MG tablet Take 1 tablet (40 mg total) by mouth every evening. 90 tablet 3  No current facility-administered medications for this visit.     Allergies:   Sulfonamide derivatives   Social History:  The patient  reports that he has been smoking.  He has never used smokeless tobacco. He reports that he does not drink alcohol or use drugs.   Family History:  The patient's family history includes Alcohol abuse in his brother; Cancer in his daughter, mother, other, and other; Heart disease in his brother.    ROS:  Please see the history of present illness.   Otherwise, review of systems is positive for hearing loss, cough, constipation, balance problems, easy brusing.   All  other systems are reviewed and negative.    PHYSICAL EXAM: VS:  BP 126/64   Pulse (!) 55  , BMI There is no height or weight on file to calculate BMI. GEN: Well nourished, well developed, in no acute distress  HEENT: normal  Neck: no JVD, carotid bruits, or masses Cardiac: RRR;  2/6 murmur at the base, no rubs, or gallops,no edema Respiratory:  clear to auscultation bilaterally, normal work of breathing GI: soft, nontender, nondistended, + BS MS: no deformity or atrophy  Skin: warm and dry Neuro:  Strength and sensation are intact Psych: euthymic mood, full affect  EKG:  EKG is ordered today. Personal review of the ekg ordered shows sinus rhythm, RBBB, 1 degree Av block, rate 55  Recent Labs: 01/05/2017: ALT 8; BUN 34; Creatinine, Ser 2.13; Hemoglobin 13.7; Platelets 266.0; Potassium 4.2; Sodium 138; TSH 2.18    Lipid Panel     Component Value Date/Time   CHOL 127 01/05/2017 1502   TRIG 132.0 01/05/2017 1502   HDL 38.00 (L) 01/05/2017 1502   CHOLHDL 3 01/05/2017 1502   VLDL 26.4 01/05/2017 1502   LDLCALC 63 01/05/2017 1502     Wt Readings from Last 3 Encounters:  01/05/17 171 lb (77.6 kg)  07/08/16 172 lb (78 kg)  05/09/16 179 lb 6.4 oz (81.4 kg)      Other studies Reviewed: Additional studies/ records that were reviewed today include: TTE 02/05/17  Review of the above records today demonstrates:  - Left ventricle: The cavity size was normal. There was mild focal   basal hypertrophy of the septum. Systolic function was normal.   The estimated ejection fraction was in the range of 60% to 65%.   Left ventricular diastolic function parameters were normal. - Aortic valve: There was moderate stenosis. There was mild   regurgitation. Valve area (VTI): 1.18 cm^2. Valve area (Vmax): 1   cm^2. Valve area (Vmean): 0.89 cm^2. - Mitral valve: Calcified annulus. Mildly thickened leaflets .   There was mild regurgitation. Valve area by pressure half-time:   1.52 cm^2. - Left  atrium: The atrium was mildly dilated. - Pulmonary arteries: PA peak pressure: 31 mm Hg (S).   ASSESSMENT AND PLAN:  1.  Moderate aortic stenosis: Found on echo after a murmur was heard. He does not have any symptoms of chest pain, shortness of breath, or syncope. We'll plan to continue to monitor.will decrease his aspirin to 81 mg.   2. Mild Mitral regurgitation: Currently asymptomatic. Continue current management.  3. Hypertension: Blood pressure well controlled. No changes at this time.  4. Tobacco abuse: Consult on cessation. Patient has no interest in quitting  Current medicines are reviewed at length with the patient today.   The patient does not have concerns regarding his medicines.  The following changes were made today:  none  Labs/ tests ordered today  include:  No orders of the defined types were placed in this encounter.    Disposition:   FU with Will Camnitz 6 months  Signed, Will Meredith Leeds, MD  02/16/2017 9:14 AM     [facility] [address] [place]  [address] [phone] (office) [phone] (fax)

## 2017-02-16 NOTE — Patient Instructions (Signed)
Medication Instructions:  Your physician has recommended you make the following change in your medication:  1. DECREASE Aspirin to 81 mg daily  -- If you need a refill on your cardiac medications before your next appointment, please call your pharmacy. --  Labwork: None ordered  Testing/Procedures: None ordered  Follow-Up: Your physician wants you to follow-up in: 6 months with Dr. Camnitz.  You will receive a reminder letter in the mail two months in advance. If you don't receive a letter, please call our office to schedule the follow-up appointment.  Thank you for choosing [facility]!!   Khamora Karan, RN [phone]       

## 2017-02-19 ENCOUNTER — Other Ambulatory Visit: Payer: Self-pay | Admitting: Internal Medicine

## 2017-03-01 DIAGNOSIS — L820 Inflamed seborrheic keratosis: Secondary | ICD-10-CM | POA: Diagnosis not present

## 2017-03-04 ENCOUNTER — Telehealth: Payer: Self-pay | Admitting: Internal Medicine

## 2017-03-04 MED ORDER — FEXOFENADINE HCL 180 MG PO TABS: 180.0000 mg | ORAL_TABLET | Freq: Every day | ORAL | 2 refills | Status: DC

## 2017-03-04 MED ORDER — TRIAMCINOLONE ACETONIDE 55 MCG/ACT NA AERO: 2.0000 | INHALATION_SPRAY | Freq: Every day | NASAL | 12 refills | Status: DC

## 2017-03-04 NOTE — Telephone Encounter (Signed)
Pt's wife has been informed and will pick up scripts for the patient. Understands an OV is needed if no improvement.

## 2017-03-04 NOTE — Telephone Encounter (Signed)
Pts wife said that the pt has a constant runny nose and cough. She has been giving him benadryl and some over the counter cough/cold medication to try to dry some of it up but it does not seem to be helping. She wanted to know if Dr Jenny Reichmann could send something in for him or if he could suggest something that would help clear some of this up. The pt said that he did not feel like coming up here so he will not be making an appointment to be seen.. Please advise.

## 2017-03-04 NOTE — Telephone Encounter (Signed)
Ok for change benadryl to allegra 180 qd, and nasacort asd,  to f/u any worsening symptoms or concerns

## 2017-05-12 DIAGNOSIS — N2581 Secondary hyperparathyroidism of renal origin: Secondary | ICD-10-CM | POA: Diagnosis not present

## 2017-05-12 DIAGNOSIS — N184 Chronic kidney disease, stage 4 (severe): Secondary | ICD-10-CM | POA: Diagnosis not present

## 2017-05-12 DIAGNOSIS — D631 Anemia in chronic kidney disease: Secondary | ICD-10-CM | POA: Diagnosis not present

## 2017-05-18 DIAGNOSIS — N184 Chronic kidney disease, stage 4 (severe): Secondary | ICD-10-CM | POA: Diagnosis not present

## 2017-05-18 DIAGNOSIS — N2581 Secondary hyperparathyroidism of renal origin: Secondary | ICD-10-CM | POA: Diagnosis not present

## 2017-05-18 DIAGNOSIS — I10 Essential (primary) hypertension: Secondary | ICD-10-CM | POA: Diagnosis not present

## 2017-05-18 DIAGNOSIS — K5909 Other constipation: Secondary | ICD-10-CM | POA: Diagnosis not present

## 2017-05-18 DIAGNOSIS — D631 Anemia in chronic kidney disease: Secondary | ICD-10-CM | POA: Diagnosis not present

## 2017-06-10 ENCOUNTER — Ambulatory Visit (INDEPENDENT_AMBULATORY_CARE_PROVIDER_SITE_OTHER): Payer: Medicare HMO | Admitting: Internal Medicine

## 2017-06-10 ENCOUNTER — Encounter: Payer: Self-pay | Admitting: Internal Medicine

## 2017-06-10 VITALS — BP 136/86 | HR 70 | Temp 97.6°F | Ht 65.0 in | Wt 164.0 lb

## 2017-06-10 DIAGNOSIS — M545 Low back pain: Secondary | ICD-10-CM | POA: Diagnosis not present

## 2017-06-10 DIAGNOSIS — J449 Chronic obstructive pulmonary disease, unspecified: Secondary | ICD-10-CM | POA: Diagnosis not present

## 2017-06-10 DIAGNOSIS — Z23 Encounter for immunization: Secondary | ICD-10-CM

## 2017-06-10 DIAGNOSIS — I10 Essential (primary) hypertension: Secondary | ICD-10-CM

## 2017-06-10 MED ORDER — TRAMADOL HCL 50 MG PO TABS
50.0000 mg | ORAL_TABLET | Freq: Four times a day (QID) | ORAL | 0 refills | Status: DC | PRN
Start: 2017-06-10 — End: 2017-10-18

## 2017-06-10 MED ORDER — PREDNISONE 10 MG PO TABS
ORAL_TABLET | ORAL | 0 refills | Status: DC
Start: 2017-06-10 — End: 2017-09-22

## 2017-06-10 NOTE — Assessment & Plan Note (Addendum)
stable overall by history and exam, and pt to continue medical treatment as before,  to f/u any worsening symptoms or concerns 

## 2017-06-10 NOTE — Progress Notes (Signed)
Subjective:    Patient ID: [name], male    DOB: 04-Jul-1925, 83 y.o.   MRN: [id number]  HPI  Here with 1-2 days sharp constant moderate left low back pain x 2 days, and denies bowel or bladder change, fever, wt loss,  worsening LE pain/numbness/weakness, gait change or falls.  Spent yesterday in bed as any movement except lying flat seemed to make worse.  Tylenol not helping.  Denies urinary symptoms such as dysuria, frequency, urgency, flank pain, hematuria or n/v, fever, chills.  Pt denies new neurological symptoms such as new headache, or facial or extremity weakness or numbness   Pt denies polydipsia, polyuria Past Medical History:  Diagnosis Date  . ALLERGIC RHINITIS 09/29/2007  . Aortic stenosis 02/05/2017  . BACK PAIN 01/31/2009  . BENIGN PROSTATIC HYPERTROPHY 05/12/2007  . CHEST PAIN 01/31/2008  . CHRONIC OBSTRUCTIVE PULMONARY DISEASE, ACUTE EXACERBATION 08/17/2007  . CONJUNCTIVITIS, ALLERGIC, CHRONIC 03/24/2010  . CONSTIPATION 02/19/2010  . COPD 05/12/2007  . GERD 02/23/2010  . GOUT 07/04/2010  . History of CVA (cerebrovascular accident) 05/03/2011  . HYPERLIPIDEMIA 05/12/2007  . HYPERTENSION 05/12/2007  . NEPHROLITHIASIS, HX OF 08/17/2007  . OBESITY 05/12/2007  . PEPTIC ULCER DISEASE 05/12/2007  . PERIPHERAL VASCULAR DISEASE 05/12/2007  . Personal history of unspecified circulatory disease 05/12/2007  . PLANTAR FASCIITIS, RIGHT 09/27/2008  . RENAL CELL CANCER 05/12/2007  . RENAL INSUFFICIENCY 08/17/2007  . SKIN LESION 11/07/2010  . VERTIGO 09/29/2007  . WRIST PAIN, RIGHT 02/26/2009   Past Surgical History:  Procedure Laterality Date  . CATARACT EXTRACTION    . ear surgury    . NEPHRECTOMY    . NEPHROURETERECTOMY    . s.p left knee replacement  10/08  . s/p bilat fem pop bypass      reports that he has been smoking.  He has never used smokeless tobacco. He reports that he does not drink alcohol or use drugs. family history includes Alcohol abuse in his brother; Cancer in his  daughter, mother, other, and other; Heart disease in his brother. Allergies  Allergen Reactions  . Sulfonamide Derivatives     REACTION: rash   Current Outpatient Prescriptions on File Prior to Visit  Medication Sig Dispense Refill  . allopurinol (ZYLOPRIM) 100 MG tablet Take 1 tablet (100 mg total) by mouth daily. 90 tablet 3  . amLODipine (NORVASC) 5 MG tablet Take 1 tablet (5 mg total) by mouth daily. 90 tablet 3  . aspirin EC 81 MG tablet Take 1 tablet (81 mg total) by mouth daily. 90 tablet 3  . cetirizine (ZYRTEC) 10 MG tablet Take 10 mg by mouth daily as needed.     . cyclobenzaprine (FLEXERIL) 5 MG tablet Take 5 mg by mouth 3 (three) times daily as needed.      . diphenhydrAMINE (BENADRYL) 25 mg capsule Take 25 mg by mouth daily as needed.    . fexofenadine (ALLEGRA) 180 MG tablet Take 1 tablet (180 mg total) by mouth daily. 30 tablet 2  . hydrochlorothiazide (HYDRODIURIL) 25 MG tablet Take 1 tablet (25 mg total) by mouth daily. 90 tablet 3  . indomethacin (INDOCIN) 50 MG capsule Take 50 mg by mouth 3 (three) times daily with meals. For acute gout only    . meclizine (ANTIVERT) 12.5 MG tablet TAKE 1 TABLET FOUR TIMES DAILY AS NEEDED 180 tablet 0  . omeprazole (PRILOSEC) 20 MG capsule Take 1 capsule (20 mg total) by mouth daily. 90 capsule 3  . polyethylene glycol powder (  MIRALAX) powder Take 17 g by mouth daily as needed.     . pravastatin (PRAVACHOL) 40 MG tablet Take 1 tablet (40 mg total) by mouth every evening. 90 tablet 3  . triamcinolone (NASACORT) 55 MCG/ACT AERO nasal inhaler Place 2 sprays into the nose daily. 1 Inhaler 12   No current facility-administered medications on file prior to visit.    Review of Systems  Constitutional: Negative for other unusual diaphoresis or sweats HENT: Negative for ear discharge or swelling Eyes: Negative for other worsening visual disturbances Respiratory: Negative for stridor or other swelling  Gastrointestinal: Negative for worsening  distension or other blood Genitourinary: Negative for retention or other urinary change Musculoskeletal: Negative for other MSK pain or swelling Skin: Negative for color change or other new lesions Neurological: Negative for worsening tremors and other numbness  Psychiatric/Behavioral: Negative for worsening agitation or other fatigue All other system neg per pt    Objective:   Physical Exam BP 136/86   Pulse 70   Temp 97.6 F (36.4 C) (Oral)   Ht 5\' 5"  (1.651 m)   Wt 164 lb (74.4 kg)   SpO2 98%   BMI 27.29 kg/m  VS noted,  Constitutional: Pt appears in NAD HENT: Head: NCAT.  Right Ear: External ear normal.  Left Ear: External ear normal.  Eyes: . Pupils are equal, round, and reactive to light. Conjunctivae and EOM are normal Nose: without d/c or deformity Neck: Neck supple. Gross normal ROM Cardiovascular: Normal rate and regular rhythm.   Pulmonary/Chest: Effort normal and breath sounds without rales or wheezing.  Abd:  Soft, NT, ND, + BS, no organomegaly Spine nontender, denies significant lumbar paravertebral tenderness or buttock tender Neurological: Pt is alert. At baseline orientation, motor grossly intact Skin: Skin is warm. No rashes, other new lesions, no LE edema Psychiatric: Pt behavior is normal without agitation  No other exam findings    Assessment & Plan:

## 2017-06-10 NOTE — Patient Instructions (Signed)
Please take all new medication as prescribed - the pain medication, and prednisone  Please return for any worsening leg weakness  Please continue all other medications as before, and refills have been done if requested.  Please have the pharmacy call with any other refills you may need.  Please keep your appointments with your specialists as you may have planned

## 2017-06-10 NOTE — Assessment & Plan Note (Signed)
Exam benign, severe yesterday now somewhat improved and able to ambulate with cane to get here, no neuro changes, suspect underlying DJD or DDD pain flare; no recent falls, will hold on imaging, will be for trial tramadol prn, predpac asd, consider MRI for any persistent or worsening

## 2017-06-10 NOTE — Assessment & Plan Note (Signed)
stable overall by history and exam, recent data reviewed with pt, and pt to continue medical treatment as before,  to f/u any worsening symptoms or concerns BP Readings from Last 3 Encounters:  06/10/17 136/86  02/16/17 126/64  01/05/17 136/70

## 2017-08-10 ENCOUNTER — Ambulatory Visit: Payer: Medicare HMO | Admitting: Cardiology

## 2017-09-02 DIAGNOSIS — Z85828 Personal history of other malignant neoplasm of skin: Secondary | ICD-10-CM | POA: Diagnosis not present

## 2017-09-02 DIAGNOSIS — L821 Other seborrheic keratosis: Secondary | ICD-10-CM | POA: Diagnosis not present

## 2017-09-02 DIAGNOSIS — D225 Melanocytic nevi of trunk: Secondary | ICD-10-CM | POA: Diagnosis not present

## 2017-09-02 DIAGNOSIS — L814 Other melanin hyperpigmentation: Secondary | ICD-10-CM | POA: Diagnosis not present

## 2017-09-21 NOTE — Progress Notes (Signed)
Electrophysiology Office Note   Date:  09/22/2017   ID:  HOPE Chavez, DOB March 10, 1925, MRN [id number]  PCP:  [name], MD Primary Electrophysiologist:  [name], MD    Chief Complaint  Patient presents with  . Follow-up    Aortic valve stenosis     History of Present Illness: [name] is a 84 y.o. male who is being seen today for the evaluation of heart murmur at the request of [name], MD. Presenting today for electrophysiology evaluation. He is found to have a murmur by his primary physician who ordered an echocardiogram which showed moderate aortic stenosis as well as mild mitral regurgitation.  Today, denies symptoms of palpitations, chest pain, shortness of breath, orthopnea, PND, lower extremity edema, claudication, dizziness, presyncope, syncope, bleeding, or neurologic sequela. The patient is tolerating medications without difficulties.  He is started to have some weight loss.  He is lost apparently up to 30 pounds.  He says that he says potentially due to stopping sweets.  Of note the patient is a World War II veteran.  Past Medical History:  Diagnosis Date  . ALLERGIC RHINITIS 09/29/2007  . Aortic stenosis 02/05/2017  . BACK PAIN 01/31/2009  . BENIGN PROSTATIC HYPERTROPHY 05/12/2007  . CHEST PAIN 01/31/2008  . CHRONIC OBSTRUCTIVE PULMONARY DISEASE, ACUTE EXACERBATION 08/17/2007  . CONJUNCTIVITIS, ALLERGIC, CHRONIC 03/24/2010  . CONSTIPATION 02/19/2010  . COPD 05/12/2007  . GERD 02/23/2010  . GOUT 07/04/2010  . History of CVA (cerebrovascular accident) 05/03/2011  . HYPERLIPIDEMIA 05/12/2007  . HYPERTENSION 05/12/2007  . NEPHROLITHIASIS, HX OF 08/17/2007  . OBESITY 05/12/2007  . PEPTIC ULCER DISEASE 05/12/2007  . PERIPHERAL VASCULAR DISEASE 05/12/2007  . Personal history of unspecified circulatory disease 05/12/2007  . PLANTAR FASCIITIS, RIGHT 09/27/2008  . RENAL CELL CANCER 05/12/2007  . RENAL INSUFFICIENCY 08/17/2007  . SKIN LESION 11/07/2010  .  VERTIGO 09/29/2007  . WRIST PAIN, RIGHT 02/26/2009   Past Surgical History:  Procedure Laterality Date  . CATARACT EXTRACTION    . ear surgury    . NEPHRECTOMY    . NEPHROURETERECTOMY    . s.p left knee replacement  10/08  . s/p bilat fem pop bypass       Current Outpatient Medications  Medication Sig Dispense Refill  . allopurinol (ZYLOPRIM) 100 MG tablet Take 1 tablet (100 mg total) by mouth daily. 90 tablet 3  . amLODipine (NORVASC) 5 MG tablet Take 1 tablet (5 mg total) by mouth daily. 90 tablet 3  . aspirin EC 81 MG tablet Take 1 tablet (81 mg total) by mouth daily. 90 tablet 3  . cetirizine (ZYRTEC) 10 MG tablet Take 10 mg by mouth daily as needed.     . diphenhydrAMINE (BENADRYL) 25 mg capsule Take 25 mg by mouth daily as needed.    . hydrochlorothiazide (HYDRODIURIL) 25 MG tablet Take 1 tablet (25 mg total) by mouth daily. 90 tablet 3  . indomethacin (INDOCIN) 50 MG capsule Take 50 mg by mouth 3 (three) times daily with meals. For acute gout only    . meclizine (ANTIVERT) 12.5 MG tablet TAKE 1 TABLET FOUR TIMES DAILY AS NEEDED 180 tablet 0  . omeprazole (PRILOSEC) 20 MG capsule Take 1 capsule (20 mg total) by mouth daily. 90 capsule 3  . polyethylene glycol powder (MIRALAX) powder Take 17 g by mouth daily as needed.     . pravastatin (PRAVACHOL) 40 MG tablet Take 1 tablet (40 mg total) by mouth every evening.  90 tablet 3  . traMADol (ULTRAM) 50 MG tablet Take 1 tablet (50 mg total) by mouth every 6 (six) hours as needed. 60 tablet 0   No current facility-administered medications for this visit.     Allergies:   Sulfonamide derivatives   Social History:  The patient  reports that he has been smoking.  he has never used smokeless tobacco. He reports that he does not drink alcohol or use drugs.   Family History:  The patient's family history includes Alcohol abuse in his brother; Cancer in his daughter, mother, other, and other; Heart disease in his brother.    ROS:  Please  see the history of present illness.   Otherwise, review of systems is positive for weight loss, hearing loss, cough, constipation, nausea, balance problems, dizziness, easy bruising.   All other systems are reviewed and negative.   PHYSICAL EXAM: VS:  BP 134/72   Pulse 75   Ht 5\' 5"  (1.651 m)   Wt 155 lb (70.3 kg)   BMI 25.79 kg/m  , BMI Body mass index is 25.79 kg/m. GEN: Well nourished, well developed, in no acute distress  HEENT: normal  Neck: no JVD, carotid bruits, or masses Cardiac: RRR; no murmurs, rubs, or gallops,no edema  Respiratory:  clear to auscultation bilaterally, normal work of breathing GI: soft, nontender, nondistended, + BS MS: no deformity or atrophy  Skin: warm and dry Neuro:  Strength and sensation are intact Psych: euthymic mood, full affect  EKG:  EKG is ordered today. Personal review of the ekg ordered shows sinus rhythm, first-degree AV block, PVCs, right bundle branch block  Recent Labs: 01/05/2017: ALT 8; BUN 34; Creatinine, Ser 2.13; Hemoglobin 13.7; Platelets 266.0; Potassium 4.2; Sodium 138; TSH 2.18    Lipid Panel     Component Value Date/Time   CHOL 127 01/05/2017 1502   TRIG 132.0 01/05/2017 1502   HDL 38.00 (L) 01/05/2017 1502   CHOLHDL 3 01/05/2017 1502   VLDL 26.4 01/05/2017 1502   LDLCALC 63 01/05/2017 1502     Wt Readings from Last 3 Encounters:  09/22/17 155 lb (70.3 kg)  06/10/17 164 lb (74.4 kg)  01/05/17 171 lb (77.6 kg)      Other studies Reviewed: Additional studies/ records that were reviewed today include: TTE 02/05/17  Review of the above records today demonstrates:  - Left ventricle: The cavity size was normal. There was mild focal   basal hypertrophy of the septum. Systolic function was normal.   The estimated ejection fraction was in the range of 60% to 65%.   Left ventricular diastolic function parameters were normal. - Aortic valve: There was moderate stenosis. There was mild   regurgitation. Valve area (VTI):  1.18 cm^2. Valve area (Vmax): 1   cm^2. Valve area (Vmean): 0.89 cm^2. - Mitral valve: Calcified annulus. Mildly thickened leaflets .   There was mild regurgitation. Valve area by pressure half-time:   1.52 cm^2. - Left atrium: The atrium was mildly dilated. - Pulmonary arteries: PA peak pressure: 31 mm Hg (S).   ASSESSMENT AND PLAN:  1.  Moderate aortic stenosis: Found on echo.  Does not have any symptoms of chest pain or shortness of breath.  No syncope.  We Kyriana Yankee continue to monitor.  2. Mild Mitral regurgitation: Currently asymptomatic.  No therapy at this time.  3. Hypertension: Blood pressure is well controlled  4. Tobacco abuse: No interest in quitting.  Current medicines are reviewed at length with the patient today.  The patient does not have concerns regarding his medicines.  The following changes were made today: None  Labs/ tests ordered today include:  No orders of the defined types were placed in this encounter.    Disposition:   FU with Jevon Littlepage 9 months  Signed, Townes Fuhs Meredith Leeds, MD  09/22/2017 9:44 AM     [facility] [address] [place]  [address] [phone] (office) [phone] (fax)

## 2017-09-22 ENCOUNTER — Ambulatory Visit: Payer: Medicare HMO | Admitting: Cardiology

## 2017-09-22 ENCOUNTER — Encounter: Payer: Self-pay | Admitting: Cardiology

## 2017-09-22 VITALS — BP 134/72 | HR 75 | Ht 65.0 in | Wt 155.0 lb

## 2017-09-22 DIAGNOSIS — I350 Nonrheumatic aortic (valve) stenosis: Secondary | ICD-10-CM | POA: Diagnosis not present

## 2017-09-22 DIAGNOSIS — I10 Essential (primary) hypertension: Secondary | ICD-10-CM

## 2017-09-22 NOTE — Patient Instructions (Signed)
Medication Instructions:  Your physician recommends that you continue on your current medications as directed. Please refer to the Current Medication list given to you today.  * If you need a refill on your cardiac medications before your next appointment, please call your pharmacy.   Labwork: None ordered   Testing/Procedures: None ordered  Follow-Up: Your physician wants you to follow-up in: 9 months with Dr. Camnitz.  You will receive a reminder letter in the mail two months in advance. If you don't receive a letter, please call our office to schedule the follow-up appointment.  Thank you for choosing [facility]!!   Tanny Harnack, RN [phone]       

## 2017-10-15 DIAGNOSIS — H353131 Nonexudative age-related macular degeneration, bilateral, early dry stage: Secondary | ICD-10-CM | POA: Diagnosis not present

## 2017-10-15 DIAGNOSIS — H52203 Unspecified astigmatism, bilateral: Secondary | ICD-10-CM | POA: Diagnosis not present

## 2017-10-15 DIAGNOSIS — H04123 Dry eye syndrome of bilateral lacrimal glands: Secondary | ICD-10-CM | POA: Diagnosis not present

## 2017-10-15 DIAGNOSIS — H524 Presbyopia: Secondary | ICD-10-CM | POA: Diagnosis not present

## 2017-10-15 DIAGNOSIS — Z961 Presence of intraocular lens: Secondary | ICD-10-CM | POA: Diagnosis not present

## 2017-10-18 ENCOUNTER — Ambulatory Visit (INDEPENDENT_AMBULATORY_CARE_PROVIDER_SITE_OTHER)
Admission: RE | Admit: 2017-10-18 | Discharge: 2017-10-18 | Disposition: A | Discharge status: Home / Self Care | Payer: Medicare HMO | Source: Ambulatory Visit | Attending: Internal Medicine | Admitting: Internal Medicine

## 2017-10-18 ENCOUNTER — Ambulatory Visit (INDEPENDENT_AMBULATORY_CARE_PROVIDER_SITE_OTHER): Payer: Medicare HMO | Admitting: Internal Medicine

## 2017-10-18 ENCOUNTER — Other Ambulatory Visit (INDEPENDENT_AMBULATORY_CARE_PROVIDER_SITE_OTHER): Payer: Medicare HMO

## 2017-10-18 ENCOUNTER — Encounter: Payer: Self-pay | Admitting: Internal Medicine

## 2017-10-18 VITALS — BP 128/82 | HR 70 | Temp 97.8°F | Ht 65.0 in | Wt 149.0 lb

## 2017-10-18 DIAGNOSIS — R109 Unspecified abdominal pain: Secondary | ICD-10-CM | POA: Diagnosis not present

## 2017-10-18 DIAGNOSIS — C642 Malignant neoplasm of left kidney, except renal pelvis: Secondary | ICD-10-CM

## 2017-10-18 DIAGNOSIS — N320 Bladder-neck obstruction: Secondary | ICD-10-CM | POA: Diagnosis not present

## 2017-10-18 DIAGNOSIS — N183 Chronic kidney disease, stage 3 unspecified: Secondary | ICD-10-CM

## 2017-10-18 DIAGNOSIS — J449 Chronic obstructive pulmonary disease, unspecified: Secondary | ICD-10-CM | POA: Diagnosis not present

## 2017-10-18 DIAGNOSIS — R634 Abnormal weight loss: Secondary | ICD-10-CM | POA: Diagnosis not present

## 2017-10-18 DIAGNOSIS — I350 Nonrheumatic aortic (valve) stenosis: Secondary | ICD-10-CM | POA: Diagnosis not present

## 2017-10-18 DIAGNOSIS — E559 Vitamin D deficiency, unspecified: Secondary | ICD-10-CM | POA: Diagnosis not present

## 2017-10-18 DIAGNOSIS — F329 Major depressive disorder, single episode, unspecified: Secondary | ICD-10-CM | POA: Diagnosis not present

## 2017-10-18 DIAGNOSIS — M79601 Pain in right arm: Secondary | ICD-10-CM | POA: Diagnosis not present

## 2017-10-18 DIAGNOSIS — E538 Deficiency of other specified B group vitamins: Secondary | ICD-10-CM

## 2017-10-18 DIAGNOSIS — F32A Depression, unspecified: Secondary | ICD-10-CM | POA: Insufficient documentation

## 2017-10-18 DIAGNOSIS — R0789 Other chest pain: Secondary | ICD-10-CM | POA: Diagnosis not present

## 2017-10-18 DIAGNOSIS — R0602 Shortness of breath: Secondary | ICD-10-CM | POA: Diagnosis not present

## 2017-10-18 LAB — CBC WITH DIFFERENTIAL/PLATELET
BASOS ABS: 0.1 10*3/uL (ref 0.0–0.1)
Basophils Relative: 0.8 % (ref 0.0–3.0)
EOS ABS: 0.2 10*3/uL (ref 0.0–0.7)
Eosinophils Relative: 2 % (ref 0.0–5.0)
HCT: 38.2 % — ABNORMAL LOW (ref 39.0–52.0)
HEMOGLOBIN: 12.8 g/dL — AB (ref 13.0–17.0)
LYMPHS ABS: 3.8 10*3/uL (ref 0.7–4.0)
Lymphocytes Relative: 35.6 % (ref 12.0–46.0)
MCHC: 33.6 g/dL (ref 30.0–36.0)
MCV: 93.9 fl (ref 78.0–100.0)
MONO ABS: 0.9 10*3/uL (ref 0.1–1.0)
Monocytes Relative: 8.2 % (ref 3.0–12.0)
NEUTROS PCT: 53.4 % (ref 43.0–77.0)
Neutro Abs: 5.8 10*3/uL (ref 1.4–7.7)
Platelets: 424 10*3/uL — ABNORMAL HIGH (ref 150.0–400.0)
RBC: 4.06 Mil/uL — AB (ref 4.22–5.81)
RDW: 15.1 % (ref 11.5–15.5)
WBC: 10.8 10*3/uL — AB (ref 4.0–10.5)

## 2017-10-18 LAB — URINALYSIS, ROUTINE W REFLEX MICROSCOPIC
Bilirubin Urine: NEGATIVE
HGB URINE DIPSTICK: NEGATIVE
KETONES UR: NEGATIVE
Leukocytes, UA: NEGATIVE
NITRITE: NEGATIVE
RBC / HPF: NONE SEEN (ref 0–?)
SPECIFIC GRAVITY, URINE: 1.02 (ref 1.000–1.030)
UROBILINOGEN UA: 0.2 (ref 0.0–1.0)
Urine Glucose: NEGATIVE
pH: 6 (ref 5.0–8.0)

## 2017-10-18 LAB — BASIC METABOLIC PANEL
BUN: 34 mg/dL — ABNORMAL HIGH (ref 6–23)
CALCIUM: 9 mg/dL (ref 8.4–10.5)
CO2: 28 mEq/L (ref 19–32)
Chloride: 105 mEq/L (ref 96–112)
Creatinine, Ser: 1.85 mg/dL — ABNORMAL HIGH (ref 0.40–1.50)
GFR: 36.61 mL/min — ABNORMAL LOW (ref >60.00)
Glucose, Bld: 104 mg/dL — ABNORMAL HIGH (ref 70–99)
Potassium: 4.5 mEq/L (ref 3.5–5.1)
SODIUM: 142 meq/L (ref 135–145)

## 2017-10-18 LAB — LIPID PANEL
Cholesterol: 111 mg/dL (ref 0–200)
HDL: 35.2 mg/dL — AB (ref >39.00)
LDL Cholesterol: 57 mg/dL (ref 0–99)
NonHDL: 76.19
TRIGLYCERIDES: 97 mg/dL (ref 0.0–149.0)
Total CHOL/HDL Ratio: 3
VLDL: 19.4 mg/dL (ref 0.0–40.0)

## 2017-10-18 LAB — VITAMIN D 25 HYDROXY (VIT D DEFICIENCY, FRACTURES): VITD: 59 ng/mL (ref 30.00–100.00)

## 2017-10-18 LAB — HEPATIC FUNCTION PANEL
ALT: 5 U/L (ref 0–53)
AST: 12 U/L (ref 0–37)
Albumin: 3.1 g/dL — ABNORMAL LOW (ref 3.5–5.2)
Alkaline Phosphatase: 68 U/L (ref 39–117)
Bilirubin, Direct: 0.1 mg/dL (ref 0.0–0.3)
TOTAL PROTEIN: 6 g/dL (ref 6.0–8.3)
Total Bilirubin: 0.4 mg/dL (ref 0.2–1.2)

## 2017-10-18 LAB — PSA: PSA: 0.73 ng/mL (ref 0.10–4.00)

## 2017-10-18 LAB — VITAMIN B12: Vitamin B-12: 254 pg/mL (ref 211–911)

## 2017-10-18 LAB — TSH: TSH: 2.15 u[IU]/mL (ref 0.35–4.50)

## 2017-10-18 MED ORDER — MIRTAZAPINE 15 MG PO TABS: 15.0000 mg | ORAL_TABLET | Freq: Every day | ORAL | 3 refills | Status: DC

## 2017-10-18 NOTE — Assessment & Plan Note (Signed)
For ct as above, UA with labs but doubt infectious etiology

## 2017-10-18 NOTE — Assessment & Plan Note (Signed)
stable overall by history and exam, and pt to continue medical treatment as before,  to f/u any worsening symptoms or concerns 

## 2017-10-18 NOTE — Assessment & Plan Note (Addendum)
stable overall by history and exam, and pt to continue medical treatment as before,  to f/u any worsening symptoms or concerns 

## 2017-10-18 NOTE — Assessment & Plan Note (Signed)
Cant r/o recurrence with dramatic wt loss and pain, will check cxr, also CT abd w CM

## 2017-10-18 NOTE — Assessment & Plan Note (Signed)
Etiology not clear, ? Significance today, cont to monitor as evaluation and condition evolve

## 2017-10-18 NOTE — Assessment & Plan Note (Signed)
Possibly related to wt loss and sleeping more, for remeron 15 qhs in light of low appetite as well

## 2017-10-18 NOTE — Assessment & Plan Note (Addendum)
Possibly due to depression, cannot r/o malignancy, also for SPEP  Note:  Total time for pt hx, exam, review of record with pt in the room, determination of diagnoses and plan for further eval and tx is > 40 min, with over 50% spent in coordination and counseling of patient including the differential dx, tx, further evaluation and other management of weight loss, right flank pain, right arm pain, depression, hx of renal cell ca, COPD, mod AS, and CKD with solitary kidney

## 2017-10-18 NOTE — Patient Instructions (Signed)
.  Please take all new medication as prescribed - the remeron (for depression, sleep and appetite)  Please continue all other medications as before, and refills have been done if requested.  Please have the pharmacy call with any other refills you may need.  Please keep your appointments with your specialists as you may have planned  You will be contacted regarding the referral for: CT scan for the abdomen and pelvis  Please go to the XRAY Department in the Basement (go straight as you get off the elevator) for the x-ray testing  Please go to the LAB in the Basement (turn left off the elevator) for the tests to be done today  You will be contacted by phone if any changes need to be made immediately.  Otherwise, you will receive a letter about your results with an explanation, but please check with MyChart first.  Please remember to sign up for MyChart if you have not done so, as this will be important to you in the future with finding out test results, communicating by private email, and scheduling acute appointments online when needed.

## 2017-10-18 NOTE — Progress Notes (Signed)
Subjective:    Patient ID: [name], male    DOB: 1924-09-12, 84 y.o.   MRN: [id number]  HPI  Here to f/u with somewhat vague but significant complaints of "hurting all over", losing wt 15 lbs assoc simply with lower appetite, sleeping more, recurring right flank/lower post chest area pain mild to mod intermittent but no clearly msk, pleuritic, positional or exertional.  Denies fever, cough, ST or dysphagia.  Walking slowly today, c/o worsening balance, gait and general weakness.  Likely mild depressed per wife who notes his condition of vision and hearing loss and diminished wt and stamina.  Pt denies chest pain, increased sob or doe, wheezing, orthopnea, PND, increased LE swelling, palpitations, dizziness or syncope.  Denies worsening reflux, abd pain, dysphagia, n/v, bowel change or blood.   Has recently seen cardiology and overall AS stable.  Other pain he deems more significant is right mid humeru pain, non tender but "deep" Wt Readings from Last 3 Encounters:  10/18/17 149 lb (67.6 kg)  09/22/17 155 lb (70.3 kg)  06/10/17 164 lb (74.4 kg)   Past Medical History:  Diagnosis Date  . ALLERGIC RHINITIS 09/29/2007  . Aortic stenosis 02/05/2017  . BACK PAIN 01/31/2009  . BENIGN PROSTATIC HYPERTROPHY 05/12/2007  . CHEST PAIN 01/31/2008  . CHRONIC OBSTRUCTIVE PULMONARY DISEASE, ACUTE EXACERBATION 08/17/2007  . CONJUNCTIVITIS, ALLERGIC, CHRONIC 03/24/2010  . CONSTIPATION 02/19/2010  . COPD 05/12/2007  . GERD 02/23/2010  . GOUT 07/04/2010  . History of CVA (cerebrovascular accident) 05/03/2011  . HYPERLIPIDEMIA 05/12/2007  . HYPERTENSION 05/12/2007  . NEPHROLITHIASIS, HX OF 08/17/2007  . OBESITY 05/12/2007  . PEPTIC ULCER DISEASE 05/12/2007  . PERIPHERAL VASCULAR DISEASE 05/12/2007  . Personal history of unspecified circulatory disease 05/12/2007  . PLANTAR FASCIITIS, RIGHT 09/27/2008  . RENAL CELL CANCER 05/12/2007  . RENAL INSUFFICIENCY 08/17/2007  . SKIN LESION 11/07/2010  . VERTIGO 09/29/2007  .  WRIST PAIN, RIGHT 02/26/2009   Past Surgical History:  Procedure Laterality Date  . CATARACT EXTRACTION    . ear surgury    . NEPHRECTOMY    . NEPHROURETERECTOMY    . s.p left knee replacement  10/08  . s/p bilat fem pop bypass      reports that he has been smoking.  he has never used smokeless tobacco. He reports that he does not drink alcohol or use drugs. family history includes Alcohol abuse in his brother; Cancer in his daughter, mother, other, and other; Heart disease in his brother. Allergies  Allergen Reactions  . Sulfonamide Derivatives     REACTION: rash   Current Outpatient Medications on File Prior to Visit  Medication Sig Dispense Refill  . allopurinol (ZYLOPRIM) 100 MG tablet Take 1 tablet (100 mg total) by mouth daily. 90 tablet 3  . amLODipine (NORVASC) 5 MG tablet Take 1 tablet (5 mg total) by mouth daily. 90 tablet 3  . aspirin EC 81 MG tablet Take 1 tablet (81 mg total) by mouth daily. 90 tablet 3  . cetirizine (ZYRTEC) 10 MG tablet Take 10 mg by mouth daily as needed.     . diphenhydrAMINE (BENADRYL) 25 mg capsule Take 25 mg by mouth daily as needed.    . hydrochlorothiazide (HYDRODIURIL) 25 MG tablet Take 1 tablet (25 mg total) by mouth daily. 90 tablet 3  . indomethacin (INDOCIN) 50 MG capsule Take 50 mg by mouth 3 (three) times daily with meals. For acute gout only    . meclizine (ANTIVERT) 12.5 MG tablet TAKE  1 TABLET FOUR TIMES DAILY AS NEEDED 180 tablet 0  . omeprazole (PRILOSEC) 20 MG capsule Take 1 capsule (20 mg total) by mouth daily. 90 capsule 3  . polyethylene glycol powder (MIRALAX) powder Take 17 g by mouth daily as needed.     . pravastatin (PRAVACHOL) 40 MG tablet Take 1 tablet (40 mg total) by mouth every evening. 90 tablet 3   No current facility-administered medications on file prior to visit.    Review of Systems  Constitutional: Negative for other unusual diaphoresis or sweats HENT: Negative for ear discharge or swelling Eyes: Negative for  other worsening visual disturbances Respiratory: Negative for stridor or other swelling  Gastrointestinal: Negative for worsening distension or other blood Genitourinary: Negative for retention or other urinary change Musculoskeletal: Negative for other MSK pain or swelling Skin: Negative for color change or other new lesions Neurological: Negative for worsening tremors and other numbness  Psychiatric/Behavioral: Negative for worsening agitation or other fatigue \\All  other system neg per pt    Objective:   Physical Exam BP 128/82   Pulse 70   Temp 97.8 F (36.6 C) (Oral)   Ht 5\' 5"  (1.651 m)   Wt 149 lb (67.6 kg)   SpO2 96%   BMI 24.79 kg/m  VS noted, weak, frail, walks slowly with cane,  Constitutional: Pt appears in NAD HENT: Head: NCAT.  Right Ear: External ear normal.  Left Ear: External ear normal.  Eyes: . Pupils are equal, round, and reactive to light. Conjunctivae and EOM are normal Nose: without d/c or deformity Neck: Neck supple. Gross normal ROM Cardiovascular: Normal rate and regular rhythm.  with gr 2-3/6 sys murmur RUSB Pulmonary/Chest: Effort normal and breath sounds without rales or wheezing.  Abd:  Soft, NT, ND, + BS, no organomegaly, no flank tender Neurological: Pt is alert. At baseline orientation, motor grossly intact Skin: Skin is warm. No rashes, other new lesions, no LE edema Psychiatric: Pt behavior is normal without agitation, + fatigued depressed affectNo other exam findings    Assessment & Plan:

## 2017-10-18 NOTE — Assessment & Plan Note (Signed)
stable overall by history and exam, recent data reviewed with pt, and pt to continue medical treatment as before,  to f/u any worsening symptoms or concerns Lab Results  Component Value Date   CREATININE 1.85 (H) 10/18/2017

## 2017-10-19 ENCOUNTER — Encounter: Payer: Self-pay | Admitting: Internal Medicine

## 2017-10-20 ENCOUNTER — Other Ambulatory Visit: Payer: Self-pay | Admitting: Internal Medicine

## 2017-10-20 DIAGNOSIS — R109 Unspecified abdominal pain: Secondary | ICD-10-CM

## 2017-10-20 DIAGNOSIS — Z85528 Personal history of other malignant neoplasm of kidney: Secondary | ICD-10-CM

## 2017-10-20 DIAGNOSIS — R634 Abnormal weight loss: Secondary | ICD-10-CM

## 2017-10-21 ENCOUNTER — Other Ambulatory Visit: Payer: Self-pay | Admitting: Internal Medicine

## 2017-10-21 ENCOUNTER — Inpatient Hospital Stay: Admission: RE | Admit: 2017-10-21 | Payer: Medicare HMO | Source: Ambulatory Visit

## 2017-10-21 DIAGNOSIS — Z85528 Personal history of other malignant neoplasm of kidney: Secondary | ICD-10-CM

## 2017-10-21 DIAGNOSIS — R634 Abnormal weight loss: Secondary | ICD-10-CM

## 2017-10-25 ENCOUNTER — Emergency Department (HOSPITAL_COMMUNITY): Payer: Medicare HMO

## 2017-10-25 ENCOUNTER — Other Ambulatory Visit: Payer: Self-pay

## 2017-10-25 ENCOUNTER — Telehealth: Payer: Self-pay | Admitting: Internal Medicine

## 2017-10-25 ENCOUNTER — Encounter (HOSPITAL_COMMUNITY): Payer: Self-pay

## 2017-10-25 ENCOUNTER — Inpatient Hospital Stay (HOSPITAL_COMMUNITY)
Admission: EM | Admit: 2017-10-25 | Discharge: 2017-10-28 | DRG: 516 | Disposition: A | Discharge status: Home Health | Payer: Medicare HMO | Attending: Internal Medicine | Admitting: Internal Medicine

## 2017-10-25 DIAGNOSIS — Z7982 Long term (current) use of aspirin: Secondary | ICD-10-CM

## 2017-10-25 DIAGNOSIS — N183 Chronic kidney disease, stage 3 unspecified: Secondary | ICD-10-CM | POA: Diagnosis present

## 2017-10-25 DIAGNOSIS — I129 Hypertensive chronic kidney disease with stage 1 through stage 4 chronic kidney disease, or unspecified chronic kidney disease: Secondary | ICD-10-CM | POA: Diagnosis present

## 2017-10-25 DIAGNOSIS — H3411 Central retinal artery occlusion, right eye: Secondary | ICD-10-CM | POA: Diagnosis not present

## 2017-10-25 DIAGNOSIS — Z96652 Presence of left artificial knee joint: Secondary | ICD-10-CM | POA: Diagnosis present

## 2017-10-25 DIAGNOSIS — Z8673 Personal history of transient ischemic attack (TIA), and cerebral infarction without residual deficits: Secondary | ICD-10-CM | POA: Diagnosis not present

## 2017-10-25 DIAGNOSIS — Z882 Allergy status to sulfonamides status: Secondary | ICD-10-CM | POA: Diagnosis not present

## 2017-10-25 DIAGNOSIS — I350 Nonrheumatic aortic (valve) stenosis: Secondary | ICD-10-CM | POA: Diagnosis present

## 2017-10-25 DIAGNOSIS — H543 Unqualified visual loss, both eyes: Secondary | ICD-10-CM | POA: Diagnosis present

## 2017-10-25 DIAGNOSIS — D72829 Elevated white blood cell count, unspecified: Secondary | ICD-10-CM | POA: Diagnosis present

## 2017-10-25 DIAGNOSIS — J449 Chronic obstructive pulmonary disease, unspecified: Secondary | ICD-10-CM | POA: Diagnosis present

## 2017-10-25 DIAGNOSIS — H47013 Ischemic optic neuropathy, bilateral: Secondary | ICD-10-CM | POA: Diagnosis present

## 2017-10-25 DIAGNOSIS — E785 Hyperlipidemia, unspecified: Secondary | ICD-10-CM | POA: Diagnosis present

## 2017-10-25 DIAGNOSIS — H9190 Unspecified hearing loss, unspecified ear: Secondary | ICD-10-CM | POA: Diagnosis present

## 2017-10-25 DIAGNOSIS — I10 Essential (primary) hypertension: Secondary | ICD-10-CM | POA: Diagnosis not present

## 2017-10-25 DIAGNOSIS — M109 Gout, unspecified: Secondary | ICD-10-CM | POA: Diagnosis present

## 2017-10-25 DIAGNOSIS — Z79899 Other long term (current) drug therapy: Secondary | ICD-10-CM

## 2017-10-25 DIAGNOSIS — M316 Other giant cell arteritis: Principal | ICD-10-CM | POA: Diagnosis present

## 2017-10-25 DIAGNOSIS — Z85528 Personal history of other malignant neoplasm of kidney: Secondary | ICD-10-CM

## 2017-10-25 DIAGNOSIS — Z905 Acquired absence of kidney: Secondary | ICD-10-CM | POA: Diagnosis not present

## 2017-10-25 DIAGNOSIS — H3413 Central retinal artery occlusion, bilateral: Secondary | ICD-10-CM | POA: Diagnosis not present

## 2017-10-25 DIAGNOSIS — F17210 Nicotine dependence, cigarettes, uncomplicated: Secondary | ICD-10-CM | POA: Diagnosis present

## 2017-10-25 DIAGNOSIS — R51 Headache: Secondary | ICD-10-CM | POA: Diagnosis not present

## 2017-10-25 DIAGNOSIS — K219 Gastro-esophageal reflux disease without esophagitis: Secondary | ICD-10-CM | POA: Diagnosis present

## 2017-10-25 DIAGNOSIS — I739 Peripheral vascular disease, unspecified: Secondary | ICD-10-CM | POA: Diagnosis present

## 2017-10-25 DIAGNOSIS — N400 Enlarged prostate without lower urinary tract symptoms: Secondary | ICD-10-CM | POA: Diagnosis present

## 2017-10-25 DIAGNOSIS — N184 Chronic kidney disease, stage 4 (severe): Secondary | ICD-10-CM | POA: Diagnosis present

## 2017-10-25 DIAGNOSIS — I677 Cerebral arteritis, not elsewhere classified: Secondary | ICD-10-CM | POA: Diagnosis not present

## 2017-10-25 DIAGNOSIS — H53133 Sudden visual loss, bilateral: Secondary | ICD-10-CM | POA: Diagnosis not present

## 2017-10-25 DIAGNOSIS — H47293 Other optic atrophy, bilateral: Secondary | ICD-10-CM | POA: Diagnosis not present

## 2017-10-25 DIAGNOSIS — I6509 Occlusion and stenosis of unspecified vertebral artery: Secondary | ICD-10-CM | POA: Diagnosis present

## 2017-10-25 DIAGNOSIS — H547 Unspecified visual loss: Secondary | ICD-10-CM

## 2017-10-25 DIAGNOSIS — H538 Other visual disturbances: Secondary | ICD-10-CM | POA: Diagnosis not present

## 2017-10-25 DIAGNOSIS — H4710 Unspecified papilledema: Secondary | ICD-10-CM | POA: Diagnosis present

## 2017-10-25 LAB — CBC
HCT: 39.3 % (ref 39.0–52.0)
HEMOGLOBIN: 13.2 g/dL (ref 13.0–17.0)
MCH: 31.2 pg (ref 26.0–34.0)
MCHC: 33.6 g/dL (ref 30.0–36.0)
MCV: 92.9 fL (ref 78.0–100.0)
PLATELETS: 393 10*3/uL (ref 150–400)
RBC: 4.23 MIL/uL (ref 4.22–5.81)
RDW: 14.4 % (ref 11.5–15.5)
WBC: 10.6 10*3/uL — ABNORMAL HIGH (ref 4.0–10.5)

## 2017-10-25 LAB — COMPREHENSIVE METABOLIC PANEL
ALK PHOS: 78 U/L (ref 38–126)
ALT: 10 U/L — AB (ref 17–63)
ANION GAP: 12 (ref 5–15)
AST: 24 U/L (ref 15–41)
Albumin: 3 g/dL — ABNORMAL LOW (ref 3.5–5.0)
BUN: 21 mg/dL — ABNORMAL HIGH (ref 6–20)
CALCIUM: 9.5 mg/dL (ref 8.9–10.3)
CO2: 25 mmol/L (ref 22–32)
CREATININE: 1.89 mg/dL — AB (ref 0.61–1.24)
Chloride: 99 mmol/L — ABNORMAL LOW (ref 101–111)
GFR, EST AFRICAN AMERICAN: 34 mL/min — AB (ref >60)
GFR, EST NON AFRICAN AMERICAN: 30 mL/min — AB (ref >60)
Glucose, Bld: 109 mg/dL — ABNORMAL HIGH (ref 65–99)
Potassium: 4.4 mmol/L (ref 3.5–5.1)
SODIUM: 136 mmol/L (ref 135–145)
Total Bilirubin: 0.6 mg/dL (ref 0.3–1.2)
Total Protein: 6.5 g/dL (ref 6.5–8.1)

## 2017-10-25 LAB — I-STAT TROPONIN, ED: TROPONIN I, POC: 0.01 ng/mL (ref 0.00–0.08)

## 2017-10-25 LAB — I-STAT CHEM 8, ED
BUN: 23 mg/dL — ABNORMAL HIGH (ref 6–20)
CALCIUM ION: 1.22 mmol/L (ref 1.15–1.40)
Chloride: 98 mmol/L — ABNORMAL LOW (ref 101–111)
Creatinine, Ser: 1.9 mg/dL — ABNORMAL HIGH (ref 0.61–1.24)
GLUCOSE: 106 mg/dL — AB (ref 65–99)
HCT: 43 % (ref 39.0–52.0)
Hemoglobin: 14.6 g/dL (ref 13.0–17.0)
Potassium: 4.3 mmol/L (ref 3.5–5.1)
SODIUM: 137 mmol/L (ref 135–145)
TCO2: 26 mmol/L (ref 22–32)

## 2017-10-25 LAB — DIFFERENTIAL
BASOS PCT: 0 %
Basophils Absolute: 0 10*3/uL (ref 0.0–0.1)
Eosinophils Absolute: 0.2 10*3/uL (ref 0.0–0.7)
Eosinophils Relative: 2 %
LYMPHS ABS: 3.6 10*3/uL (ref 0.7–4.0)
LYMPHS PCT: 34 %
MONOS PCT: 9 %
Monocytes Absolute: 1 10*3/uL (ref 0.1–1.0)
NEUTROS ABS: 5.8 10*3/uL (ref 1.7–7.7)
NEUTROS PCT: 55 %

## 2017-10-25 LAB — PROTIME-INR
INR: 1.05
PROTHROMBIN TIME: 13.6 s (ref 11.4–15.2)

## 2017-10-25 LAB — APTT: aPTT: 33 seconds (ref 24–36)

## 2017-10-25 LAB — SEDIMENTATION RATE: SED RATE: 70 mm/h — AB (ref 0–16)

## 2017-10-25 NOTE — Telephone Encounter (Signed)
JJ has spoken to Dr. Zadie Rhine in regards to the patients status.

## 2017-10-25 NOTE — Telephone Encounter (Signed)
Patients wife called [facility] on 2/23, stating that patient had lost vision.  Patient was advised to go to ED.  Please follow up in regard.

## 2017-10-25 NOTE — ED Provider Notes (Signed)
  Patient placed in Quick Look pathway, seen and evaluated for chief complaint of vision loss in both of his eyes for the past 4 days. They didn't want to come to the ER because it was raining earlier. He was seen by ophthomology and they think he has temporal arteritis possibly. No headache. .  Pertinent H&P findings include patient reports he can see me, but that I am blurry. Speech is clear and coherent, non-toxic appearing, no respiratory distress.    Based on initial evaluation, labs are indicated and radiology studies are indicated.  Patient counseled on process, plan, and necessity for staying for completing the evaluation. No code stroke at this time.    Kirk Pean, PA-C 10/25/17 1624    Noemi Chapel, MD 10/26/17 1357

## 2017-10-25 NOTE — ED Triage Notes (Signed)
Pt presents to the ed with complaints of vision loss in both eyes, completely in the left eye and little bit in the right eye, that started Friday night at 2000. His doctor sent him here.

## 2017-10-25 NOTE — Telephone Encounter (Signed)
Patient wife [name] calling to let Dr Jenny Reichmann know that the patient went to see Dr Gershon Crane and he had stated that the pt had a stroke behind his eyes that the pt is blind in his rt eye he has to go se Dr Zadie Rhine at 1:45 today patient had headache and vision was bad on Friday please call wife [name] back on [phone]

## 2017-10-25 NOTE — Telephone Encounter (Signed)
Spoke to pt's wife and let her know that I will make sure Dr. Jenny Reichmann is aware of the patients status.   Dr. Denice Bors

## 2017-10-25 NOTE — Telephone Encounter (Signed)
Please let wife know that we strongly encourage pt to go to ED for further evaluation after Dr Zadie Rhine visit, especially if loss of vision and/or headache persists, as this may be due acute stroke

## 2017-10-25 NOTE — Telephone Encounter (Signed)
Spoke with Zacarias Pontes charge nurse and informed her of the below info. They will be looking out for the patients arrival.

## 2017-10-25 NOTE — Telephone Encounter (Signed)
Called pt, no VM, phone continuously rings.

## 2017-10-25 NOTE — Telephone Encounter (Signed)
Shirron to call triage in ED  Pt has high suspicion for medical emergency  Pt has lost vision to both eyes , complete on right and near complete to left  Pt was advised to ED Sat feb 23 but apparently did not go  Pt has seen Dr Zadie Rhine (cell [phone]) who stands ready to answer any questions  Dr Zadie Rhine opinion is pt has either Bilateral ischemic optic nerves, or bilat central retinal occlusions  As we cant r/o arteritis (somewhat akin to temporal arteritis) the recommendation is for immediate hospn for IV steroid and further consultation with optho as indicated  Pt is being told now to go to ED admitting per Dr Zadie Rhine, and I agreed to have my office pass on this information, as pt should be treated as Medical Emergency

## 2017-10-26 ENCOUNTER — Encounter (HOSPITAL_COMMUNITY): Payer: Self-pay | Admitting: Family Medicine

## 2017-10-26 ENCOUNTER — Inpatient Hospital Stay (HOSPITAL_COMMUNITY): Payer: Medicare HMO

## 2017-10-26 ENCOUNTER — Inpatient Hospital Stay: Admission: RE | Admit: 2017-10-26 | Payer: Medicare HMO | Source: Ambulatory Visit

## 2017-10-26 ENCOUNTER — Other Ambulatory Visit: Payer: Self-pay

## 2017-10-26 ENCOUNTER — Telehealth: Payer: Self-pay

## 2017-10-26 DIAGNOSIS — Z96652 Presence of left artificial knee joint: Secondary | ICD-10-CM | POA: Diagnosis present

## 2017-10-26 DIAGNOSIS — N400 Enlarged prostate without lower urinary tract symptoms: Secondary | ICD-10-CM | POA: Diagnosis present

## 2017-10-26 DIAGNOSIS — R51 Headache: Secondary | ICD-10-CM | POA: Diagnosis not present

## 2017-10-26 DIAGNOSIS — H543 Unqualified visual loss, both eyes: Secondary | ICD-10-CM | POA: Diagnosis present

## 2017-10-26 DIAGNOSIS — N184 Chronic kidney disease, stage 4 (severe): Secondary | ICD-10-CM | POA: Diagnosis present

## 2017-10-26 DIAGNOSIS — I6509 Occlusion and stenosis of unspecified vertebral artery: Secondary | ICD-10-CM | POA: Diagnosis present

## 2017-10-26 DIAGNOSIS — K219 Gastro-esophageal reflux disease without esophagitis: Secondary | ICD-10-CM | POA: Diagnosis present

## 2017-10-26 DIAGNOSIS — H547 Unspecified visual loss: Secondary | ICD-10-CM | POA: Diagnosis present

## 2017-10-26 DIAGNOSIS — J449 Chronic obstructive pulmonary disease, unspecified: Secondary | ICD-10-CM | POA: Diagnosis present

## 2017-10-26 DIAGNOSIS — Z882 Allergy status to sulfonamides status: Secondary | ICD-10-CM | POA: Diagnosis not present

## 2017-10-26 DIAGNOSIS — M316 Other giant cell arteritis: Secondary | ICD-10-CM | POA: Diagnosis present

## 2017-10-26 DIAGNOSIS — M109 Gout, unspecified: Secondary | ICD-10-CM | POA: Diagnosis present

## 2017-10-26 DIAGNOSIS — F17210 Nicotine dependence, cigarettes, uncomplicated: Secondary | ICD-10-CM | POA: Diagnosis present

## 2017-10-26 DIAGNOSIS — I739 Peripheral vascular disease, unspecified: Secondary | ICD-10-CM | POA: Diagnosis present

## 2017-10-26 DIAGNOSIS — M810 Age-related osteoporosis without current pathological fracture: Secondary | ICD-10-CM

## 2017-10-26 DIAGNOSIS — I129 Hypertensive chronic kidney disease with stage 1 through stage 4 chronic kidney disease, or unspecified chronic kidney disease: Secondary | ICD-10-CM | POA: Diagnosis present

## 2017-10-26 DIAGNOSIS — H47013 Ischemic optic neuropathy, bilateral: Secondary | ICD-10-CM | POA: Diagnosis present

## 2017-10-26 DIAGNOSIS — Z8673 Personal history of transient ischemic attack (TIA), and cerebral infarction without residual deficits: Secondary | ICD-10-CM | POA: Diagnosis not present

## 2017-10-26 DIAGNOSIS — Z85528 Personal history of other malignant neoplasm of kidney: Secondary | ICD-10-CM | POA: Diagnosis not present

## 2017-10-26 DIAGNOSIS — Z905 Acquired absence of kidney: Secondary | ICD-10-CM | POA: Diagnosis not present

## 2017-10-26 DIAGNOSIS — H9190 Unspecified hearing loss, unspecified ear: Secondary | ICD-10-CM | POA: Diagnosis present

## 2017-10-26 DIAGNOSIS — Z79899 Other long term (current) drug therapy: Secondary | ICD-10-CM | POA: Diagnosis not present

## 2017-10-26 DIAGNOSIS — H4710 Unspecified papilledema: Secondary | ICD-10-CM | POA: Diagnosis present

## 2017-10-26 DIAGNOSIS — N183 Chronic kidney disease, stage 3 (moderate): Secondary | ICD-10-CM | POA: Diagnosis not present

## 2017-10-26 DIAGNOSIS — Z7982 Long term (current) use of aspirin: Secondary | ICD-10-CM | POA: Diagnosis not present

## 2017-10-26 DIAGNOSIS — I10 Essential (primary) hypertension: Secondary | ICD-10-CM | POA: Diagnosis not present

## 2017-10-26 DIAGNOSIS — E785 Hyperlipidemia, unspecified: Secondary | ICD-10-CM | POA: Diagnosis present

## 2017-10-26 DIAGNOSIS — D72829 Elevated white blood cell count, unspecified: Secondary | ICD-10-CM | POA: Diagnosis present

## 2017-10-26 DIAGNOSIS — I350 Nonrheumatic aortic (valve) stenosis: Secondary | ICD-10-CM | POA: Diagnosis present

## 2017-10-26 LAB — ETHANOL

## 2017-10-26 LAB — BASIC METABOLIC PANEL
Anion gap: 13 (ref 5–15)
BUN: 24 mg/dL — AB (ref 6–20)
CHLORIDE: 101 mmol/L (ref 101–111)
CO2: 22 mmol/L (ref 22–32)
CREATININE: 1.87 mg/dL — AB (ref 0.61–1.24)
Calcium: 8.9 mg/dL (ref 8.9–10.3)
GFR calc Af Amer: 35 mL/min — ABNORMAL LOW (ref >60)
GFR calc non Af Amer: 30 mL/min — ABNORMAL LOW (ref >60)
Glucose, Bld: 127 mg/dL — ABNORMAL HIGH (ref 65–99)
POTASSIUM: 3.7 mmol/L (ref 3.5–5.1)
Sodium: 136 mmol/L (ref 135–145)

## 2017-10-26 LAB — LIPID PANEL
CHOL/HDL RATIO: 2.7 ratio
Cholesterol: 101 mg/dL (ref 0–200)
HDL: 37 mg/dL — AB (ref >40)
LDL Cholesterol: 43 mg/dL (ref 0–99)
TRIGLYCERIDES: 103 mg/dL (ref <150)
VLDL: 21 mg/dL (ref 0–40)

## 2017-10-26 LAB — CBC WITH DIFFERENTIAL/PLATELET
BASOS PCT: 0 %
Basophils Absolute: 0 10*3/uL (ref 0.0–0.1)
Eosinophils Absolute: 0.3 10*3/uL (ref 0.0–0.7)
Eosinophils Relative: 2 %
HEMATOCRIT: 36.5 % — AB (ref 39.0–52.0)
Hemoglobin: 12.4 g/dL — ABNORMAL LOW (ref 13.0–17.0)
LYMPHS ABS: 3.4 10*3/uL (ref 0.7–4.0)
Lymphocytes Relative: 25 %
MCH: 31 pg (ref 26.0–34.0)
MCHC: 34 g/dL (ref 30.0–36.0)
MCV: 91.3 fL (ref 78.0–100.0)
MONO ABS: 1.2 10*3/uL — AB (ref 0.1–1.0)
MONOS PCT: 9 %
Neutro Abs: 8.7 10*3/uL — ABNORMAL HIGH (ref 1.7–7.7)
Neutrophils Relative %: 64 %
Platelets: 360 10*3/uL (ref 150–400)
RBC: 4 MIL/uL — AB (ref 4.22–5.81)
RDW: 14.1 % (ref 11.5–15.5)
WBC: 13.6 10*3/uL — AB (ref 4.0–10.5)

## 2017-10-26 LAB — CBG MONITORING, ED: Glucose-Capillary: 158 mg/dL — ABNORMAL HIGH (ref 65–99)

## 2017-10-26 MED ORDER — HYDROCODONE-ACETAMINOPHEN 5-325 MG PO TABS: 1.0000 | ORAL_TABLET | ORAL | Status: DC | PRN

## 2017-10-26 MED ORDER — ACETAMINOPHEN 325 MG PO TABS: 650.0000 mg | ORAL_TABLET | Freq: Four times a day (QID) | ORAL | Status: DC | PRN

## 2017-10-26 MED ORDER — PANTOPRAZOLE SODIUM 40 MG PO TBEC
40.0000 mg | DELAYED_RELEASE_TABLET | Freq: Two times a day (BID) | ORAL | Status: DC
  Administered 2017-10-26 – 2017-10-28 (×4): 40 mg via ORAL
  Filled 2017-10-26 (×4): qty 1

## 2017-10-26 MED ORDER — MIRTAZAPINE 15 MG PO TABS
15.0000 mg | ORAL_TABLET | Freq: Every day | ORAL | Status: DC
  Administered 2017-10-26 – 2017-10-27 (×2): 15 mg via ORAL
  Filled 2017-10-26 (×3): qty 1

## 2017-10-26 MED ORDER — OXYCODONE HCL 5 MG PO TABS: 15.0000 mg | ORAL_TABLET | ORAL | Status: DC | PRN

## 2017-10-26 MED ORDER — ASPIRIN 325 MG PO TABS
325.0000 mg | ORAL_TABLET | Freq: Every day | ORAL | Status: DC
  Administered 2017-10-26 – 2017-10-28 (×2): 325 mg via ORAL
  Filled 2017-10-26 (×2): qty 1

## 2017-10-26 MED ORDER — ONDANSETRON HCL 4 MG PO TABS: 4.0000 mg | ORAL_TABLET | Freq: Four times a day (QID) | ORAL | Status: DC | PRN

## 2017-10-26 MED ORDER — SODIUM CHLORIDE 0.9% FLUSH
3.0000 mL | Freq: Two times a day (BID) | INTRAVENOUS | Status: DC
  Administered 2017-10-27 – 2017-10-28 (×3): 3 mL via INTRAVENOUS

## 2017-10-26 MED ORDER — ASPIRIN 300 MG RE SUPP: 300.0000 mg | Freq: Every day | RECTAL | Status: DC

## 2017-10-26 MED ORDER — BISACODYL 5 MG PO TBEC: 5.0000 mg | DELAYED_RELEASE_TABLET | Freq: Every day | ORAL | Status: DC | PRN

## 2017-10-26 MED ORDER — OXYCODONE HCL 5 MG PO TABS: 5.0000 mg | ORAL_TABLET | ORAL | Status: DC | PRN

## 2017-10-26 MED ORDER — PRAVASTATIN SODIUM 40 MG PO TABS
40.0000 mg | ORAL_TABLET | Freq: Every evening | ORAL | Status: DC
  Administered 2017-10-26 – 2017-10-27 (×2): 40 mg via ORAL
  Filled 2017-10-26 (×2): qty 1

## 2017-10-26 MED ORDER — POLYETHYLENE GLYCOL 3350 17 G PO PACK: 17.0000 g | PACK | Freq: Every day | ORAL | Status: DC | PRN

## 2017-10-26 MED ORDER — HEPARIN SODIUM (PORCINE) 5000 UNIT/ML IJ SOLN
5000.0000 [IU] | Freq: Three times a day (TID) | INTRAMUSCULAR | Status: DC
  Administered 2017-10-26 – 2017-10-28 (×4): 5000 [IU] via SUBCUTANEOUS
  Filled 2017-10-26 (×4): qty 1

## 2017-10-26 MED ORDER — ACETAMINOPHEN 650 MG RE SUPP: 650.0000 mg | Freq: Four times a day (QID) | RECTAL | Status: DC | PRN

## 2017-10-26 MED ORDER — ONDANSETRON HCL 4 MG/2ML IJ SOLN
4.0000 mg | Freq: Four times a day (QID) | INTRAMUSCULAR | Status: DC | PRN
Start: 2017-10-26 — End: 2017-10-28

## 2017-10-26 MED ORDER — CEFAZOLIN SODIUM-DEXTROSE 1-4 GM/50ML-% IV SOLN
1.0000 g | INTRAVENOUS | Status: AC
  Administered 2017-10-27: 1 g via INTRAVENOUS
  Filled 2017-10-26 (×2): qty 50

## 2017-10-26 MED ORDER — SODIUM CHLORIDE 0.9 % IV SOLN
1000.0000 mg | Freq: Once | INTRAVENOUS | Status: AC
  Administered 2017-10-26: 1000 mg via INTRAVENOUS
  Filled 2017-10-26: qty 8

## 2017-10-26 MED ORDER — SODIUM CHLORIDE 0.9 % IV SOLN
1000.0000 mg | INTRAVENOUS | Status: AC
  Administered 2017-10-27 – 2017-10-28 (×2): 1000 mg via INTRAVENOUS
  Filled 2017-10-26 (×2): qty 8

## 2017-10-26 MED ORDER — SODIUM CHLORIDE 0.9 % IV SOLN
INTRAVENOUS | Status: DC
  Administered 2017-10-26 (×2): via INTRAVENOUS

## 2017-10-26 MED ORDER — ALLOPURINOL 100 MG PO TABS
100.0000 mg | ORAL_TABLET | Freq: Every day | ORAL | Status: DC
  Administered 2017-10-26 – 2017-10-28 (×2): 100 mg via ORAL
  Filled 2017-10-26 (×2): qty 1

## 2017-10-26 NOTE — Consult Note (Addendum)
NEURO HOSPITALIST CONSULT NOTE   Requestig physician: Dr. Rockne Menghini   Reason for Consult: Possible temporal arteritis   History obtained from:  Patient   Chart    HPI:                                                                                                                                          [name] is an 84 y.o. male with a PMH of hypertension, hyperlipidemia, peripheral vascular disease, stage III CKD and a history of stroke who was admitted 10/25/16 for evaluation of right sided temporal headache followed by vision loss. ESR was 70.  Given 1 g of IV Solu-Medrol for empiric treatment of temporal arteritis.  Subsequent MRI personally reviewed and showed a right retrobulbar optic nerve inflammation and possible papilledema consistent with acute anterior ischemic optic neuropathy, but no evidence of acute stroke.   Patient did see Dr. Gershon Crane neurologist on 225.  Per his note: "The patient is being evaluated for flashes of light in both eyes. The patient sees flickering lights and sees flashing lights. The symptoms are constant. The patient reports blurred vision. Review of eye symptoms reveals: flashes. Negative for floaters and eye pain. Additional comments: OD- "sees black": for 3 daysReports flashes of light Pt wife says he hasnt been able to walk good for 3 days"  Fundus Exam  Right Left  Vitreous Normal Normal  Disc Optic Disc Pallor Optic Disc Pallor  C/D Ratio 0.3 0.3  Macula Normal Normal  Vessels Normal Normal  Periphery Normal Normal     During consultation I was unable to obtain any history from the patient as he is extremely hard of hearing, and would not allow me to even do a physical exam.  Both daughters are in the room and explained to me that he had a loss of sight in his right eye on this past Friday and what they believed to be a decreased sight in the left eye.  In discussing temporal arteritis and the symptoms, they did say  that he has been complaining of jaw pain when he eats.  He has not been complaining of scalp tenderness or pain on the temporal region.    Past Medical History:  Diagnosis Date  . ALLERGIC RHINITIS 09/29/2007  . Aortic stenosis 02/05/2017  . BACK PAIN 01/31/2009  . BENIGN PROSTATIC HYPERTROPHY 05/12/2007  . CHEST PAIN 01/31/2008  . CHRONIC OBSTRUCTIVE PULMONARY DISEASE, ACUTE EXACERBATION 08/17/2007  . CONJUNCTIVITIS, ALLERGIC, CHRONIC 03/24/2010  . CONSTIPATION 02/19/2010  . COPD 05/12/2007  . GERD 02/23/2010  . GOUT 07/04/2010  . History of CVA (cerebrovascular accident) 05/03/2011  . HYPERLIPIDEMIA 05/12/2007  . HYPERTENSION 05/12/2007  . NEPHROLITHIASIS, HX OF 08/17/2007  . OBESITY 05/12/2007  .  PEPTIC ULCER DISEASE 05/12/2007  . PERIPHERAL VASCULAR DISEASE 05/12/2007  . Personal history of unspecified circulatory disease 05/12/2007  . PLANTAR FASCIITIS, RIGHT 09/27/2008  . RENAL CELL CANCER 05/12/2007  . RENAL INSUFFICIENCY 08/17/2007  . SKIN LESION 11/07/2010  . VERTIGO 09/29/2007  . WRIST PAIN, RIGHT 02/26/2009    Past Surgical History:  Procedure Laterality Date  . CATARACT EXTRACTION    . ear surgury    . NEPHRECTOMY    . NEPHROURETERECTOMY    . s.p left knee replacement  10/08  . s/p bilat fem pop bypass      Family History  Problem Relation Age of Onset  . Cancer Mother        colon cancer  . Heart disease Brother   . Alcohol abuse Brother   . Cancer Daughter        bladder cancer  . Cancer Other        grandson leukemia and BMT  . Cancer Other        sibling with pancreatic cancer     Social History:  reports that he has been smoking.  he has never used smokeless tobacco. He reports that he does not drink alcohol or use drugs.  Allergies  Allergen Reactions  . Sulfonamide Derivatives     REACTION: rash    MEDICATIONS:                                                                                                                     Prior to Admission:  (Not in a  hospital admission) Scheduled: . allopurinol  100 mg Oral Daily  . aspirin  300 mg Rectal Daily   Or  . aspirin  325 mg Oral Daily  . heparin  5,000 Units Subcutaneous Q8H  . mirtazapine  15 mg Oral QHS  . pantoprazole  40 mg Oral BID  . pravastatin  40 mg Oral QPM  . sodium chloride flush  3 mL Intravenous Q12H   Continuous: . [START ON 10/27/2017] methylPREDNISolone (SOLU-MEDROL) injection       ROS:                                                                                                                                       History obtained from the patient  General ROS: negative for - chills, fatigue, fever, night sweats, weight gain or weight loss Psychological ROS: negative for -  behavioral disorder, hallucinations, memory difficulties, mood swings or suicidal ideation Ophthalmic ROS: Positive for - loss of vision ENT ROS: negative for - epistaxis, nasal discharge, oral lesions, sore throat, tinnitus or vertigo Allergy and Immunology ROS: negative for - hives or itchy/watery eyes Hematological and Lymphatic ROS: negative for - bleeding problems, bruising or swollen lymph nodes Endocrine ROS: negative for - galactorrhea, hair pattern changes, polydipsia/polyuria or temperature intolerance Respiratory ROS: negative for - cough, hemoptysis, shortness of breath or wheezing Cardiovascular ROS: negative for - chest pain, dyspnea on exertion, edema or irregular heartbeat Gastrointestinal ROS: negative for - abdominal pain, diarrhea, hematemesis, nausea/vomiting or stool incontinence Genito-Urinary ROS: negative for - dysuria, hematuria, incontinence or urinary frequency/urgency Musculoskeletal ROS: negative for - joint swelling or muscular weakness Neurological ROS: as noted in HPI Dermatological ROS: negative for rash and skin lesion changes   Blood pressure (!) 134/54, pulse 60, temperature (!) 97.5 F (36.4 C), temperature source Oral, resp. rate 20, height '5\' 5"'$  (1.651  m), weight 67.6 kg (149 lb), SpO2 90 %.   General Examination:                                                                                                       Physical Exam  HEENT-  Normocephalic, no lesions, without obvious abnormality.  Normal external eye and conjunctiva.   Cardiovascular- S1-S2 audible, pulses palpable throughout   Lungs-no rhonchi or wheezing noted, no excessive working breathing.  Saturations within normal limits Abdomen- All 4 quadrants palpated and nontender Extremities- Warm, dry and intact Musculoskeletal-no joint tenderness, deformity or swelling Skin-warm and dry, no hyperpigmentation, vitiligo, or suspicious lesions  Neurological Examination Mental Status: Alert, agitated, does not understand what I am asking him secondary to his significant decreased hearing and wanting to be left alone. Cranial Nerves: II: No blink to threat on the right , VF intact on left eye III,IV, VI: Strong closure of his eyelids and able to open his eyelids when not touched.  I was unable to visualize pupillary dilation and constriction as patient would not allow me to do a physical exam V,VII: Face appears symmetrical however could not get a good sensory exam VIII: hearing extremely decreased bilaterally even with hearing aids in IX,X: Unable to visualize XI: bilateral shoulder shrug XII: Would not follow instructions to stick out his tongue Motor: Right : Upper extremity   5/5    Left:     Upper extremity   5/5  Lower extremity   5/5     Lower extremity   5/5 Tone and bulk:normal tone throughout; no atrophy noted Sensory: Pinprick and light touch intact throughout, bilaterally Deep Tendon Reflexes: 2+ and symmetric throughout Plantars: Right: downgoing   Left: downgoing Cerebellar: Unable to obtain secondary to patient not following commands Gait:  not tested   Lab Results: Basic Metabolic Panel: Recent Labs  Lab 10/25/17 1617 10/25/17 1626 10/26/17 0217  NA  136 137 136  K 4.4 4.3 3.7  CL 99* 98* 101  CO2 25  --  22  GLUCOSE 109* 106* 127*  BUN 21* 23* 24*  CREATININE 1.89* 1.90* 1.87*  CALCIUM 9.5  --  8.9    CBC: Recent Labs  Lab 10/25/17 1617 10/25/17 1626 10/26/17 0217  WBC 10.6*  --  13.6*  NEUTROABS 5.8  --  8.7*  HGB 13.2 14.6 12.4*  HCT 39.3 43.0 36.5*  MCV 92.9  --  91.3  PLT 393  --  360    Cardiac Enzymes: No results for input(s): CKTOTAL, CKMB, CKMBINDEX, TROPONINI in the last 168 hours.  Lipid Panel: Recent Labs  Lab 10/26/17 0217  CHOL 101  TRIG 103  HDL 37*  CHOLHDL 2.7  VLDL 21  LDLCALC 43    Imaging: Ct Head Wo Contrast  Result Date: 10/25/2017  IMPRESSION: No acute intracranial abnormality noted. Specifically no evidence of intracranial hemorrhage or CT findings to suggest large acute infarct. Prominent chronic microvascular changes. Atrophy. Electronically Signed   By: Genia Del M.D.   On: 10/25/2017 17:38   Mr Angiogram Head Wo Contrast  Result Date: 10/26/2017 IMPRESSION: 1. Punctate increased T2 signal within right retrobulbar optic nerve with reduced diffusion and possible papilledema. Findings probably represent acute anterior ischemic optic neuropathy. A longer segment of signal abnormality would be more typical for infectious or inflammatory causes considered less likely. No definite signal abnormality of the left optic nerve. 2. No acute abnormality of brain parenchyma. 3. Moderate to severe chronic microvascular ischemic changes and moderate parenchymal volume loss of the brain. 4. Age indeterminate right intracranial vertebral artery occlusion. Otherwise negative MRA of the head. These results will be called to the ordering clinician or representative by the Radiologist Assistant, and communication documented in the PACS or zVision Dashboard. Electronically Signed   By: Kristine Garbe M.D.   On: 10/26/2017 05:35   Mr Brain Wo Contrast  Result Date: 10/26/2017  IMPRESSION: 1.  Punctate increased T2 signal within right retrobulbar optic nerve with reduced diffusion and possible papilledema. Findings probably represent acute anterior ischemic optic neuropathy. A longer segment of signal abnormality would be more typical for infectious or inflammatory causes considered less likely. No definite signal abnormality of the left optic nerve. 2. No acute abnormality of brain parenchyma. 3. Moderate to severe chronic microvascular ischemic changes and moderate parenchymal volume loss of the brain. 4. Age indeterminate right intracranial vertebral artery occlusion. Otherwise negative MRA of the head. These results will be called to the ordering clinician or representative by the Radiologist Assistant, and communication documented in the PACS or zVision Dashboard. Electronically Signed   By: Kristine Garbe M.D.   On: 10/26/2017 05:35   Mr Farley Ly Contrast  Result Date: 10/26/2017 IMPRESSION: 1. Punctate increased T2 signal within right retrobulbar optic nerve with reduced diffusion and possible papilledema. Findings probably represent acute anterior ischemic optic neuropathy. A longer segment of signal abnormality would be more typical for infectious or inflammatory causes considered less likely. No definite signal abnormality of the left optic nerve. 2. No acute abnormality of brain parenchyma. 3. Moderate to severe chronic microvascular ischemic changes and moderate parenchymal volume loss of the brain. 4. Age indeterminate right intracranial vertebral artery occlusion. Otherwise negative MRA of the head. These results will be called to the ordering clinician or representative by the Radiologist Assistant, and communication documented in the PACS or zVision Dashboard. Electronically Signed   By: Kristine Garbe M.D.   On: 10/26/2017 05:35    Assessment and plan per attending neurologist  Etta Quill PA-C [facility] [phone]  10/26/2017, 12:30  PM  NEUROHOSPITALIST ADDENDUM Seen and examined the patient today.   ASSESSMENT AND PLAN  84 y.o. male with a PMH of hypertension, hyperlipidemia, peripheral vascular disease, stage III CKD and a history of stroke/TIA presents with sudden onset loss of vision on Friday, with complete loss of vision in right eye and reduced vision in left eye.  His ESR was 70, h.o of myalgia, headache and jaw claudication  and papilledema on exam by ophthalmologist favors diagnosis of temporal arteritis.  MRI shows punctate increased T2 signal within right retrobulbar optic nerve with reduced diffusion and possible papilledema. Findings probably represent acute anterior ischemic optic neuropathy  Impression:   AION ( anterior ischemic optic neuropathy) - most likely secondary to temporal arteritis ( arteritic AION) vs stroke in eye ( nonarteritic AION)   Recommendation Continue High dose steroids Temporal artery biopsy HBAIC, Lipid profile Echocardiogram Monitor for Afib     Baylor Cortez MD [facility] [phone]  If 7pm to 7am, please call on call as listed on AMION.

## 2017-10-26 NOTE — Telephone Encounter (Signed)
Xray needed a bone density entered

## 2017-10-26 NOTE — H&P (View-Only) (Signed)
 [facility]  Patient name: [name] MRN: [id number] DOB: 11/04/1924 Sex: male   REQUESTING PROVIDER:    Dr. Rama   REASON FOR CONSULT:    Possible temporal arteritis  HISTORY OF PRESENT ILLNESS:   [name] is a 84 y.o. male, who was admitted on 225 for right-sided temporal headache followed by vision loss.  There were no precipitating factors.  He saw his ophthalmologist earlier that day and was found to have complete vision loss in the right eye and near complete loss in the left.  He was sent to a retinal specialist who suspected acute arteritis or stroke.  His ESR was 70.  He was treated with steroids.  An MRI showed right retrobulbar optic nerve inflammation and possible papilledema consistent with acute ischemic optic neuropathy.  There was no evidence of acute stroke.  We have been asked to perform temporal artery biopsy.  Patient does have a history of stroke.  He has stage III chronic renal insufficiency as well as hypertension.  PAST MEDICAL HISTORY    Past Medical History:  Diagnosis Date  . ALLERGIC RHINITIS 09/29/2007  . Aortic stenosis 02/05/2017  . BACK PAIN 01/31/2009  . BENIGN PROSTATIC HYPERTROPHY 05/12/2007  . CHEST PAIN 01/31/2008  . CHRONIC OBSTRUCTIVE PULMONARY DISEASE, ACUTE EXACERBATION 08/17/2007  . CONJUNCTIVITIS, ALLERGIC, CHRONIC 03/24/2010  . CONSTIPATION 02/19/2010  . COPD 05/12/2007  . GERD 02/23/2010  . GOUT 07/04/2010  . History of CVA (cerebrovascular accident) 05/03/2011  . HYPERLIPIDEMIA 05/12/2007  . HYPERTENSION 05/12/2007  . NEPHROLITHIASIS, HX OF 08/17/2007  . OBESITY 05/12/2007  . PEPTIC ULCER DISEASE 05/12/2007  . PERIPHERAL VASCULAR DISEASE 05/12/2007  . Personal history of unspecified circulatory disease 05/12/2007  . PLANTAR FASCIITIS, RIGHT 09/27/2008  . RENAL CELL CANCER 05/12/2007  . RENAL INSUFFICIENCY 08/17/2007  . SKIN LESION 11/07/2010  . VERTIGO 09/29/2007  . WRIST  PAIN, RIGHT 02/26/2009     FAMILY HISTORY   Family History  Problem Relation Age of Onset  . Cancer Mother        colon cancer  . Heart disease Brother   . Alcohol abuse Brother   . Cancer Daughter        bladder cancer  . Cancer Other        grandson leukemia and BMT  . Cancer Other        sibling with pancreatic cancer    SOCIAL HISTORY:   Social History   Socioeconomic History  . Marital status: Married    Spouse name: Not on file  . Number of children: 4  . Years of education: 5  . Highest education level: Not on file  Social Needs  . Financial resource strain: Not on file  . Food insecurity - worry: Not on file  . Food insecurity - inability: Not on file  . Transportation needs - medical: Not on file  . Transportation needs - non-medical: Not on file  Occupational History  . Occupation: retired printer    Employer: RETIRED  Tobacco Use  . Smoking status: Current Every Day Smoker  . Smokeless tobacco: Never Used  Substance and Sexual Activity  . Alcohol use: No  . Drug use: No  . Sexual activity: Not on file  Other Topics Concern  . Not on file  Social History Narrative   Married for 60 yrs    ALLERGIES:    Allergies  Allergen Reactions  . Sulfonamide Derivatives     REACTION: rash      CURRENT MEDICATIONS:    Current Facility-Administered Medications  Medication Dose Route Frequency Provider Last Rate Last Dose  . acetaminophen (TYLENOL) tablet 650 mg  650 mg Oral Q6H PRN Opyd, Timothy S, MD       Or  . acetaminophen (TYLENOL) suppository 650 mg  650 mg Rectal Q6H PRN Opyd, Timothy S, MD      . allopurinol (ZYLOPRIM) tablet 100 mg  100 mg Oral Daily Opyd, Timothy S, MD      . aspirin suppository 300 mg  300 mg Rectal Daily Opyd, Timothy S, MD       Or  . aspirin tablet 325 mg  325 mg Oral Daily Opyd, Timothy S, MD      . bisacodyl (DULCOLAX) EC tablet 5 mg  5 mg Oral Daily PRN Opyd, Timothy S, MD      . heparin injection 5,000 Units  5,000  Units Subcutaneous Q8H Opyd, Timothy S, MD      . HYDROcodone-acetaminophen (NORCO/VICODIN) 5-325 MG per tablet 1-2 tablet  1-2 tablet Oral Q4H PRN Opyd, Timothy S, MD      . [START ON 10/27/2017] methylPREDNISolone sodium succinate (SOLU-MEDROL) 1,000 mg in sodium chloride 0.9 % 50 mL IVPB  1,000 mg Intravenous Q24H Opyd, Timothy S, MD      . mirtazapine (REMERON) tablet 15 mg  15 mg Oral QHS Opyd, Timothy S, MD      . ondansetron (ZOFRAN) tablet 4 mg  4 mg Oral Q6H PRN Opyd, Timothy S, MD       Or  . ondansetron (ZOFRAN) injection 4 mg  4 mg Intravenous Q6H PRN Opyd, Timothy S, MD      . pantoprazole (PROTONIX) EC tablet 40 mg  40 mg Oral BID Opyd, Timothy S, MD      . polyethylene glycol (MIRALAX / GLYCOLAX) packet 17 g  17 g Oral Daily PRN Opyd, Timothy S, MD      . pravastatin (PRAVACHOL) tablet 40 mg  40 mg Oral QPM Opyd, Timothy S, MD      . sodium chloride flush (NS) 0.9 % injection 3 mL  3 mL Intravenous Q12H Opyd, Timothy S, MD       Current Outpatient Medications  Medication Sig Dispense Refill  . allopurinol (ZYLOPRIM) 100 MG tablet Take 1 tablet (100 mg total) by mouth daily. 90 tablet 3  . amLODipine (NORVASC) 5 MG tablet Take 1 tablet (5 mg total) by mouth daily. 90 tablet 3  . aspirin EC 81 MG tablet Take 1 tablet (81 mg total) by mouth daily. 90 tablet 3  . cetirizine (ZYRTEC) 10 MG tablet Take 10 mg by mouth daily as needed for allergies.     . hydrochlorothiazide (HYDRODIURIL) 25 MG tablet Take 1 tablet (25 mg total) by mouth daily. 90 tablet 3  . indomethacin (INDOCIN) 50 MG capsule Take 50 mg by mouth 3 (three) times daily with meals. For acute gout only    . mirtazapine (REMERON) 15 MG tablet Take 1 tablet (15 mg total) by mouth at bedtime. 90 tablet 3  . omeprazole (PRILOSEC) 20 MG capsule Take 1 capsule (20 mg total) by mouth daily. 90 capsule 3  . polyethylene glycol (MIRALAX / GLYCOLAX) packet Take 17 g by mouth daily as needed for mild constipation.    . pravastatin  (PRAVACHOL) 40 MG tablet Take 1 tablet (40 mg total) by mouth every evening. 90 tablet 3    REVIEW OF SYSTEMS:   [X] denotes positive finding, [ ]   denotes negative finding Cardiac  Comments:  Chest pain or chest pressure:  This   Shortness of breath upon exertion:    Short of breath when lying flat:    Irregular heart rhythm:        Vascular    Pain in calf, thigh, or hip brought on by ambulation:    Pain in feet at night that wakes you up from your sleep:     Blood clot in your veins:    Leg swelling:         Pulmonary    Oxygen at home:    Productive cough:     Wheezing:         Neurologic    Sudden weakness in arms or legs:     Sudden numbness in arms or legs:     Sudden onset of difficulty speaking or slurred speech:    Temporary loss of vision in one eye:  x   Problems with dizziness:         Gastrointestinal    Blood in stool:      Vomited blood:         Genitourinary    Burning when urinating:     Blood in urine:        Psychiatric    Major depression:         Hematologic    Bleeding problems:    Problems with blood clotting too easily:        Skin    Rashes or ulcers:        Constitutional    Fever or chills:     PHYSICAL EXAM:   Vitals:   10/26/17 0630 10/26/17 0730 10/26/17 1000 10/26/17 1100  BP: (!) 143/63 (!) 140/55 (!) 120/48 (!) 134/54  Pulse: 74 78 66 60  Resp: 18 17 16 20  Temp:      TempSrc:      SpO2: 93% 90% 91% 90%  Weight:      Height:        GENERAL: The patient is a well-nourished male, in no acute distress. The vital signs are documented above. CARDIAC: There is a regular rate and rhythm.  PULMONARY: Nonlabored respirations MUSCULOSKELETAL: There are no major deformities or cyanosis. NEUROLOGIC: No focal weakness or paresthesias are detected. SKIN: There are no ulcers or rashes noted. PSYCHIATRIC: The patient has a normal affect.  STUDIES:   I have reviewed his MRI with the following findings: 1. Punctate increased  T2 signal within right retrobulbar optic nerve with reduced diffusion and possible papilledema. Findings probably represent acute anterior ischemic optic neuropathy. A longer segment of signal abnormality would be more typical for infectious or inflammatory causes considered less likely. No definite signal abnormality of the left optic nerve. 2. No acute abnormality of brain parenchyma. 3. Moderate to severe chronic microvascular ischemic changes and moderate parenchymal volume loss of the brain. 4. Age indeterminate right intracranial vertebral artery occlusion. Otherwise negative MRA of the head.  ASSESSMENT and PLAN   Possible temporal arteritis: I discussed with the patient's daughters as well as the patient that we would proceed with temporal artery biopsy tomorrow to help assist in the diagnosis of his symptoms.  They understand details of the procedure.  All questions were answered.  He will be n.p.o. after midnight.   Wells Zaiah Credeur, MD [facility] Tel [phone] Pager [phone] 

## 2017-10-26 NOTE — ED Notes (Signed)
Pt is resting with eyes closed this RN attempted to wake up for 10 am medications, family request to wait since he hasn't been able to sleep all night. Pt is resting comfortably will check back within the hour.

## 2017-10-26 NOTE — Consult Note (Signed)
[facility]  [name] name: [name] MRN: [id number] DOB: [birth year] Sex: male   REQUESTING PROVIDER:    Dr. Rockne Menghini   REASON FOR CONSULT:    Possible temporal arteritis  HISTORY OF PRESENT ILLNESS:   [name] is a 84 y.o. male, who was admitted on 225 for right-sided temporal headache followed by vision loss.  There were no precipitating factors.  He saw his ophthalmologist earlier that day and was found to have complete vision loss in the right eye and near complete loss in the left.  He was sent to a retinal specialist who suspected acute arteritis or stroke.  His ESR was 70.  He was treated with steroids.  An MRI showed right retrobulbar optic nerve inflammation and possible papilledema consistent with acute ischemic optic neuropathy.  There was no evidence of acute stroke.  We have been asked to perform temporal artery biopsy.  [name] does have a history of stroke.  He has stage III chronic renal insufficiency as well as hypertension.  PAST MEDICAL HISTORY    Past Medical History:  Diagnosis Date  . ALLERGIC RHINITIS 09/29/2007  . Aortic stenosis 02/05/2017  . BACK PAIN 01/31/2009  . BENIGN PROSTATIC HYPERTROPHY 05/12/2007  . CHEST PAIN 01/31/2008  . CHRONIC OBSTRUCTIVE PULMONARY DISEASE, ACUTE EXACERBATION 08/17/2007  . CONJUNCTIVITIS, ALLERGIC, CHRONIC 03/24/2010  . CONSTIPATION 02/19/2010  . COPD 05/12/2007  . GERD 02/23/2010  . GOUT 07/04/2010  . History of CVA (cerebrovascular accident) 05/03/2011  . HYPERLIPIDEMIA 05/12/2007  . HYPERTENSION 05/12/2007  . NEPHROLITHIASIS, HX OF 08/17/2007  . OBESITY 05/12/2007  . PEPTIC ULCER DISEASE 05/12/2007  . PERIPHERAL VASCULAR DISEASE 05/12/2007  . Personal history of unspecified circulatory disease 05/12/2007  . PLANTAR FASCIITIS, RIGHT 09/27/2008  . RENAL CELL CANCER 05/12/2007  . RENAL INSUFFICIENCY 08/17/2007  . SKIN LESION 11/07/2010  . VERTIGO 09/29/2007  . WRIST  PAIN, RIGHT 02/26/2009     FAMILY HISTORY   Family History  Problem Relation Age of Onset  . Cancer Mother        colon cancer  . Heart disease Brother   . Alcohol abuse Brother   . Cancer Daughter        bladder cancer  . Cancer Other        grandson leukemia and BMT  . Cancer Other        sibling with pancreatic cancer    SOCIAL HISTORY:   Social History   Socioeconomic History  . Marital status: Married    Spouse name: Not on file  . Number of children: 4  . Years of education: 5  . Highest education level: Not on file  Social Needs  . Financial resource strain: Not on file  . Food insecurity - worry: Not on file  . Food insecurity - inability: Not on file  . Transportation needs - medical: Not on file  . Transportation needs - non-medical: Not on file  Occupational History  . Occupation: retired Solicitor: RETIRED  Tobacco Use  . Smoking status: Current Every Day Smoker  . Smokeless tobacco: Never Used  Substance and Sexual Activity  . Alcohol use: No  . Drug use: No  . Sexual activity: Not on file  Other Topics Concern  . Not on file  Social History Narrative   Married for 60 yrs    ALLERGIES:    Allergies  Allergen Reactions  . Sulfonamide Derivatives     REACTION: rash  CURRENT MEDICATIONS:    Current Facility-Administered Medications  Medication Dose Route Frequency Provider Last Rate Last Dose  . acetaminophen (TYLENOL) tablet 650 mg  650 mg Oral Q6H PRN Opyd, Ilene Qua, MD       Or  . acetaminophen (TYLENOL) suppository 650 mg  650 mg Rectal Q6H PRN Opyd, Ilene Qua, MD      . allopurinol (ZYLOPRIM) tablet 100 mg  100 mg Oral Daily Opyd, Ilene Qua, MD      . aspirin suppository 300 mg  300 mg Rectal Daily Opyd, Ilene Qua, MD       Or  . aspirin tablet 325 mg  325 mg Oral Daily Opyd, Ilene Qua, MD      . bisacodyl (DULCOLAX) EC tablet 5 mg  5 mg Oral Daily PRN Opyd, Ilene Qua, MD      . heparin injection 5,000 Units  5,000  Units Subcutaneous Q8H Opyd, Ilene Qua, MD      . HYDROcodone-acetaminophen (NORCO/VICODIN) 5-325 MG per tablet 1-2 tablet  1-2 tablet Oral Q4H PRN Opyd, Ilene Qua, MD      . Derrill Memo ON 10/27/2017] methylPREDNISolone sodium succinate (SOLU-MEDROL) 1,000 mg in sodium chloride 0.9 % 50 mL IVPB  1,000 mg Intravenous Q24H Opyd, Ilene Qua, MD      . mirtazapine (REMERON) tablet 15 mg  15 mg Oral QHS Opyd, Ilene Qua, MD      . ondansetron (ZOFRAN) tablet 4 mg  4 mg Oral Q6H PRN Opyd, Ilene Qua, MD       Or  . ondansetron (ZOFRAN) injection 4 mg  4 mg Intravenous Q6H PRN Opyd, Ilene Qua, MD      . pantoprazole (PROTONIX) EC tablet 40 mg  40 mg Oral BID Opyd, Ilene Qua, MD      . polyethylene glycol (MIRALAX / GLYCOLAX) packet 17 g  17 g Oral Daily PRN Opyd, Ilene Qua, MD      . pravastatin (PRAVACHOL) tablet 40 mg  40 mg Oral QPM Opyd, Timothy S, MD      . sodium chloride flush (NS) 0.9 % injection 3 mL  3 mL Intravenous Q12H Opyd, Ilene Qua, MD       Current Outpatient Medications  Medication Sig Dispense Refill  . allopurinol (ZYLOPRIM) 100 MG tablet Take 1 tablet (100 mg total) by mouth daily. 90 tablet 3  . amLODipine (NORVASC) 5 MG tablet Take 1 tablet (5 mg total) by mouth daily. 90 tablet 3  . aspirin EC 81 MG tablet Take 1 tablet (81 mg total) by mouth daily. 90 tablet 3  . cetirizine (ZYRTEC) 10 MG tablet Take 10 mg by mouth daily as needed for allergies.     . hydrochlorothiazide (HYDRODIURIL) 25 MG tablet Take 1 tablet (25 mg total) by mouth daily. 90 tablet 3  . indomethacin (INDOCIN) 50 MG capsule Take 50 mg by mouth 3 (three) times daily with meals. For acute gout only    . mirtazapine (REMERON) 15 MG tablet Take 1 tablet (15 mg total) by mouth at bedtime. 90 tablet 3  . omeprazole (PRILOSEC) 20 MG capsule Take 1 capsule (20 mg total) by mouth daily. 90 capsule 3  . polyethylene glycol (MIRALAX / GLYCOLAX) packet Take 17 g by mouth daily as needed for mild constipation.    . pravastatin  (PRAVACHOL) 40 MG tablet Take 1 tablet (40 mg total) by mouth every evening. 90 tablet 3    REVIEW OF SYSTEMS:   _0  denotes positive finding, _1   denotes negative finding Cardiac  Comments:  Chest pain or chest pressure:  This   Shortness of breath upon exertion:    Short of breath when lying flat:    Irregular heart rhythm:        Vascular    Pain in calf, thigh, or hip brought on by ambulation:    Pain in feet at night that wakes you up from your sleep:     Blood clot in your veins:    Leg swelling:         Pulmonary    Oxygen at home:    Productive cough:     Wheezing:         Neurologic    Sudden weakness in arms or legs:     Sudden numbness in arms or legs:     Sudden onset of difficulty speaking or slurred speech:    Temporary loss of vision in one eye:  x   Problems with dizziness:         Gastrointestinal    Blood in stool:      Vomited blood:         Genitourinary    Burning when urinating:     Blood in urine:        Psychiatric    Major depression:         Hematologic    Bleeding problems:    Problems with blood clotting too easily:        Skin    Rashes or ulcers:        Constitutional    Fever or chills:     PHYSICAL EXAM:   Vitals:   10/26/17 0630 10/26/17 0730 10/26/17 1000 10/26/17 1100  BP: (!) 143/63 (!) 140/55 (!) 120/48 (!) 134/54  Pulse: 74 78 66 60  Resp: _0 Temp:      TempSrc:      SpO2: 93% 90% 91% 90%  Weight:      Height:        GENERAL: The [name] is a well-nourished male, in no acute distress. The vital signs are documented above. CARDIAC: There is a regular rate and rhythm.  PULMONARY: Nonlabored respirations MUSCULOSKELETAL: There are no major deformities or cyanosis. NEUROLOGIC: No focal weakness or paresthesias are detected. SKIN: There are no ulcers or rashes noted. PSYCHIATRIC: The [name] has a normal affect.  STUDIES:   I have reviewed his MRI with the following findings: 1. Punctate increased  T2 signal within right retrobulbar optic nerve with reduced diffusion and possible papilledema. Findings probably represent acute anterior ischemic optic neuropathy. A longer segment of signal abnormality would be more typical for infectious or inflammatory causes considered less likely. No definite signal abnormality of the left optic nerve. 2. No acute abnormality of brain parenchyma. 3. Moderate to severe chronic microvascular ischemic changes and moderate parenchymal volume loss of the brain. 4. Age indeterminate right intracranial vertebral artery occlusion. Otherwise negative MRA of the head.  ASSESSMENT and PLAN   Possible temporal arteritis: I discussed with the [name]'s daughters as well as the [name] that we would proceed with temporal artery biopsy tomorrow to help assist in the diagnosis of his symptoms.  They understand details of the procedure.  All questions were answered.  He will be n.p.o. after midnight.   [name], MD [facility] [phone] Pager [phone]

## 2017-10-26 NOTE — Progress Notes (Addendum)
Progress Note    [name]  WGN:[id number] DOB: May 11, 1925  DOA: 10/25/2017 PCP: [name], MD    Brief Narrative:   Chief complaint: Follow-up vision loss  Medical records reviewed and are as summarized below:  [name] is an 84 y.o. male with a PMH of hypertension, stage III CKD and a history of stroke who was admitted 10/25/16 for evaluation of right sided temporal headache followed by vision loss.  In the ED, CT of the head was negative for acute intracranial abnormalities.  ESR was 70.  Given 1 g of IV Solu-Medrol for empiric treatment of temporal arteritis.  Assessment/Plan:   Principal Problem:   Vision loss, bilateral/ischemic optic neuropathy Evaluated by ophthalmology prior to admission with suspicion of CVA versus arteritis.  ESR 70, concerning for temporal arteritis.  Initially treated with 1 g IV Solu-Medrol.  Subsequent MRI personally reviewed and showed a right retrobulbar optic nerve inflammation and possible papilledema consistent with acute anterior ischemic optic neuropathy, but no evidence of acute stroke.  Right intracranial vertebral artery occlusion also noted.  Spoke with neurology about the patient's treatment.  Dr. Lorraine Lax recommends high-dose steroids for 3 days, followed by a prolonged taper.  Also recommend a temporal artery biopsy, so vascular surgery consulted.  Active Problems:   Essential hypertension No evidence of CVA on imaging.  Okay to resume antihypertensives when indicated.    CKD (chronic kidney disease), stage III-IV (HCC)  Creatinine consistent with usual baseline values.    History of CVA (cerebrovascular accident) No acute CVA on imaging.  Continue risk factor modification with aspirin and statins.  Body mass index is 24.79 kg/m.   Family Communication/Anticipated D/C date and plan/Code Status   DVT prophylaxis: Heparin ordered. Code Status: Full Code.  Family Communication: Daughter updated at bedside. Disposition  Plan: Likely will need rehab.  Anticipate at least 2 more days in the hospital while receiving high-dose steroids.   Medical Consultants:    Neurology   Anti-Infectives:    None  Subjective:   [name] is asleep and is hard of hearing.  Daughter is at the bedside and reports that there have been no significant changes in his condition and given that he was up most of the night, I did not awaken him.  Objective:    Vitals:   10/26/17 0540 10/26/17 0545 10/26/17 0630 10/26/17 0730  BP: (!) 159/70 (!) 115/99 (!) 143/63 (!) 140/55  Pulse: 86 81 74 78  Resp: '18 14 18 17  '$ Temp: (!) 97.5 F (36.4 C)     TempSrc: Oral     SpO2: 96% 95% 93% 90%  Weight:      Height:        Intake/Output Summary (Last 24 hours) at 10/26/2017 0842 Last data filed at 10/26/2017 0255 Gross per 24 hour  Intake 58 ml  Output -  Net 58 ml   Filed Weights   10/26/17 0203  Weight: 67.6 kg (149 lb)    Exam: General: No acute distress. Asleep. Cardiovascular: Heart sounds show a regular rate, and rhythm. No gallops or rubs.  Grade 2/6 systolic ejection murmur. No JVD. Lungs: Clear to auscultation bilaterally with good air movement. No rales, rhonchi or wheezes. Abdomen: Soft, nontender, nondistended with normal active bowel sounds. No masses. No hepatosplenomegaly. Neurological: Asleep, did not awaken.  Complete vision loss on the right with near complete vision loss on the left reported by ophthalmology.  Hard of hearing. Skin: Warm  and dry. No rashes or lesions. Extremities: No clubbing or cyanosis. No edema. Pedal pulses 2+. Psychiatric: Unable to assess.   Data Reviewed:   I have personally reviewed following labs and imaging studies:  Labs: Labs show the following:   Basic Metabolic Panel: Recent Labs  Lab 10/25/17 1617 10/25/17 1626 10/26/17 0217  NA 136 137 136  K 4.4 4.3 3.7  CL 99* 98* 101  CO2 25  --  22  GLUCOSE 109* 106* 127*  BUN 21* 23* 24*  CREATININE 1.89* 1.90*  1.87*  CALCIUM 9.5  --  8.9   GFR Estimated Creatinine Clearance: 22.8 mL/min (A) (by C-G formula based on SCr of 1.87 mg/dL (H)). Liver Function Tests: Recent Labs  Lab 10/25/17 1617  AST 24  ALT 10*  ALKPHOS 78  BILITOT 0.6  PROT 6.5  ALBUMIN 3.0*   Coagulation profile Recent Labs  Lab 10/25/17 1617  INR 1.05    CBC: Recent Labs  Lab 10/25/17 1617 10/25/17 1626 10/26/17 0217  WBC 10.6*  --  13.6*  NEUTROABS 5.8  --  8.7*  HGB 13.2 14.6 12.4*  HCT 39.3 43.0 36.5*  MCV 92.9  --  91.3  PLT 393  --  360    CBG: Recent Labs  Lab 10/26/17 0802  GLUCAP 158*   Lipid Profile: Recent Labs    10/26/17 0217  CHOL 101  HDL 37*  LDLCALC 43  TRIG 103  CHOLHDL 2.7    Microbiology No results found for this or any previous visit (from the past 240 hour(s)).  Procedures and diagnostic studies:  Ct Head Wo Contrast  Result Date: 10/25/2017 CLINICAL DATA:  84 year old male with visual loss in both eyes for the past 4 days. Possible temporal arteritis. Initial encounter. EXAM: CT HEAD WITHOUT CONTRAST TECHNIQUE: Contiguous axial images were obtained from the base of the skull through the vertex without intravenous contrast. COMPARISON:  None. FINDINGS: Brain: No intracranial hemorrhage or CT evidence of large acute infarct. Prominent chronic microvascular changes. Global atrophy. No intracranial mass lesion noted on this unenhanced exam. Vascular: Vascular calcifications. Skull: Negative. Sinuses/Orbits: No primary orbital abnormality noted. Visualized sinuses clear. IMPRESSION: No acute intracranial abnormality noted. Specifically no evidence of intracranial hemorrhage or CT findings to suggest large acute infarct. Prominent chronic microvascular changes. Atrophy. Electronically Signed   By: Genia Del M.D.   On: 10/25/2017 17:38   Mr Angiogram Head Wo Contrast  Result Date: 10/26/2017 CLINICAL DATA:  84 y/o M; 10/22/2017 right temporal headache and loss of vision.  EXAM: MRI HEAD WITHOUT CONTRAST MRI ORBITS WITHOUT CONTRAST MRA WITHOUT CONTRAST TECHNIQUE: Multiplanar, multiecho pulse sequences of the brain and surrounding structures were obtained without intravenous contrast. Thin T2 coronal and axial sequences were obtained through the orbits. Angiographic images of the head were obtained without intravenous contrast. Patient declined to continue the examination. COMPARISON:  None. FINDINGS: MRI HEAD FINDINGS Brain: No acute infarction, hemorrhage, hydrocephalus, extra-axial collection or mass lesion. Patchy confluentnonspecific foci of T2 FLAIR hyperintense signal abnormality in subcortical and periventricular white matter are compatible withmoderate to severechronic microvascular ischemic changes for age. Moderatebrain parenchymal volume loss. Vascular: Loss of normal right vertebral artery flow void. Skull and upper cervical spine: Normal marrow signal. 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 MRI ORBITS FINDINGS Orbits: Limited to thin axial and coronal T2 weighted sequences. Bilateral intra-ocular lens replacement. Question punctate increased T2 signal within the retrobulbar optic nerve, possible papilledema. Punctate reduced diffusion similar location on MRI of the brain (series  3, image 17 in series 12 image 10). No abnormal signal within the orbital, canalicular, or cisternal segments of the optic nerves identified. No abnormal signal of optic chiasm or radiations identified. Visualized sinuses: No significant abnormal signal. Soft tissues: Negative. Limited intracranial: As above. MRA HEAD FINDINGS Internal carotid arteries:  Patent. Anterior cerebral arteries:  Patent. Middle cerebral arteries: Patent. Anterior communicating artery: Patent. Posterior communicating arteries: Small right. Probable diminutive left. Posterior cerebral arteries:  Patent. Basilar artery:  Patent. Vertebral arteries: Right V4 segment occlusion, age indeterminate. Patent dominant left vertebral  artery. No evidence of high-grade stenosis, large vessel occlusion, or aneurysm unless noted above. IMPRESSION: 1. Punctate increased T2 signal within right retrobulbar optic nerve with reduced diffusion and possible papilledema. Findings probably represent acute anterior ischemic optic neuropathy. A longer segment of signal abnormality would be more typical for infectious or inflammatory causes considered less likely. No definite signal abnormality of the left optic nerve. 2. No acute abnormality of brain parenchyma. 3. Moderate to severe chronic microvascular ischemic changes and moderate parenchymal volume loss of the brain. 4. Age indeterminate right intracranial vertebral artery occlusion. Otherwise negative MRA of the head. These results will be called to the ordering clinician or representative by the Radiologist Assistant, and communication documented in the PACS or zVision Dashboard. Electronically Signed   By: Kristine Garbe M.D.   On: 10/26/2017 05:35   Mr Brain Wo Contrast  Result Date: 10/26/2017 CLINICAL DATA:  84 y/o M; 10/22/2017 right temporal headache and loss of vision. EXAM: MRI HEAD WITHOUT CONTRAST MRI ORBITS WITHOUT CONTRAST MRA WITHOUT CONTRAST TECHNIQUE: Multiplanar, multiecho pulse sequences of the brain and surrounding structures were obtained without intravenous contrast. Thin T2 coronal and axial sequences were obtained through the orbits. Angiographic images of the head were obtained without intravenous contrast. Patient declined to continue the examination. COMPARISON:  None. FINDINGS: MRI HEAD FINDINGS Brain: No acute infarction, hemorrhage, hydrocephalus, extra-axial collection or mass lesion. Patchy confluentnonspecific foci of T2 FLAIR hyperintense signal abnormality in subcortical and periventricular white matter are compatible withmoderate to severechronic microvascular ischemic changes for age. Moderatebrain parenchymal volume loss. Vascular: Loss of normal right  vertebral artery flow void. Skull and upper cervical spine: Normal marrow signal. 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 MRI ORBITS FINDINGS Orbits: Limited to thin axial and coronal T2 weighted sequences. Bilateral intra-ocular lens replacement. Question punctate increased T2 signal within the retrobulbar optic nerve, possible papilledema. Punctate reduced diffusion similar location on MRI of the brain (series 3, image 17 in series 12 image 10). No abnormal signal within the orbital, canalicular, or cisternal segments of the optic nerves identified. No abnormal signal of optic chiasm or radiations identified. Visualized sinuses: No significant abnormal signal. Soft tissues: Negative. Limited intracranial: As above. MRA HEAD FINDINGS Internal carotid arteries:  Patent. Anterior cerebral arteries:  Patent. Middle cerebral arteries: Patent. Anterior communicating artery: Patent. Posterior communicating arteries: Small right. Probable diminutive left. Posterior cerebral arteries:  Patent. Basilar artery:  Patent. Vertebral arteries: Right V4 segment occlusion, age indeterminate. Patent dominant left vertebral artery. No evidence of high-grade stenosis, large vessel occlusion, or aneurysm unless noted above. IMPRESSION: 1. Punctate increased T2 signal within right retrobulbar optic nerve with reduced diffusion and possible papilledema. Findings probably represent acute anterior ischemic optic neuropathy. A longer segment of signal abnormality would be more typical for infectious or inflammatory causes considered less likely. No definite signal abnormality of the left optic nerve. 2. No acute abnormality of brain parenchyma. 3. Moderate to severe chronic microvascular  ischemic changes and moderate parenchymal volume loss of the brain. 4. Age indeterminate right intracranial vertebral artery occlusion. Otherwise negative MRA of the head. These results will be called to the ordering clinician or representative by the Radiologist  Assistant, and communication documented in the PACS or zVision Dashboard. Electronically Signed   By: Kristine Garbe M.D.   On: 10/26/2017 05:35   Mr Darnelle Catalan AS Contrast  Result Date: 10/26/2017 CLINICAL DATA:  84 y/o M; 10/22/2017 right temporal headache and loss of vision. EXAM: MRI HEAD WITHOUT CONTRAST MRI ORBITS WITHOUT CONTRAST MRA WITHOUT CONTRAST TECHNIQUE: Multiplanar, multiecho pulse sequences of the brain and surrounding structures were obtained without intravenous contrast. Thin T2 coronal and axial sequences were obtained through the orbits. Angiographic images of the head were obtained without intravenous contrast. Patient declined to continue the examination. COMPARISON:  None. FINDINGS: MRI HEAD FINDINGS Brain: No acute infarction, hemorrhage, hydrocephalus, extra-axial collection or mass lesion. Patchy confluentnonspecific foci of T2 FLAIR hyperintense signal abnormality in subcortical and periventricular white matter are compatible withmoderate to severechronic microvascular ischemic changes for age. Moderatebrain parenchymal volume loss. Vascular: Loss of normal right vertebral artery flow void. Skull and upper cervical spine: Normal marrow signal. 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 MRI ORBITS FINDINGS Orbits: Limited to thin axial and coronal T2 weighted sequences. Bilateral intra-ocular lens replacement. Question punctate increased T2 signal within the retrobulbar optic nerve, possible papilledema. Punctate reduced diffusion similar location on MRI of the brain (series 3, image 17 in series 12 image 10). No abnormal signal within the orbital, canalicular, or cisternal segments of the optic nerves identified. No abnormal signal of optic chiasm or radiations identified. Visualized sinuses: No significant abnormal signal. Soft tissues: Negative. Limited intracranial: As above. MRA HEAD FINDINGS Internal carotid arteries:  Patent. Anterior cerebral arteries:  Patent. Middle cerebral  arteries: Patent. Anterior communicating artery: Patent. Posterior communicating arteries: Small right. Probable diminutive left. Posterior cerebral arteries:  Patent. Basilar artery:  Patent. Vertebral arteries: Right V4 segment occlusion, age indeterminate. Patent dominant left vertebral artery. No evidence of high-grade stenosis, large vessel occlusion, or aneurysm unless noted above. IMPRESSION: 1. Punctate increased T2 signal within right retrobulbar optic nerve with reduced diffusion and possible papilledema. Findings probably represent acute anterior ischemic optic neuropathy. A longer segment of signal abnormality would be more typical for infectious or inflammatory causes considered less likely. No definite signal abnormality of the left optic nerve. 2. No acute abnormality of brain parenchyma. 3. Moderate to severe chronic microvascular ischemic changes and moderate parenchymal volume loss of the brain. 4. Age indeterminate right intracranial vertebral artery occlusion. Otherwise negative MRA of the head. These results will be called to the ordering clinician or representative by the Radiologist Assistant, and communication documented in the PACS or zVision Dashboard. Electronically Signed   By: Kristine Garbe M.D.   On: 10/26/2017 05:35    Medications:   . allopurinol  100 mg Oral Daily  . aspirin  300 mg Rectal Daily   Or  . aspirin  325 mg Oral Daily  . heparin  5,000 Units Subcutaneous Q8H  . mirtazapine  15 mg Oral QHS  . pantoprazole  40 mg Oral BID  . pravastatin  40 mg Oral QPM  . sodium chloride flush  3 mL Intravenous Q12H   Continuous Infusions: . sodium chloride 75 mL/hr at 10/26/17 0659  . [START ON 10/27/2017] methylPREDNISolone (SOLU-MEDROL) injection        LOS: 0 days    Prolonged service: Additional 30 minutes  spent coordinating the care of this patient, speaking with neurology about the plan of care, calling a  vascular surgery consult, updating family at  the bedside.  Margreta Journey Annalaya Wile  [facility] Pager [phone]. If unable to reach me by pager, please call my cell phone at [phone].  *Please refer to [email or web address], password [email or web address] to get updated schedule on who will round on this patient, as hospitalists switch teams weekly. If 7PM-7AM, please contact night-coverage at [email or web address], password [email or web address] for any overnight needs.  10/26/2017, 8:42 AM

## 2017-10-26 NOTE — H&P (Signed)
History and Physical    [name] BWL:[id number] DOB: [birth year] DOA: 10/25/2017  PCP: [name], MD   Patient coming from: Home  Chief Complaint: Vision loss   HPI: [name] is a 84 y.o. male with medical history significant for hypertension, chronic kidney disease stage III, and history of stroke, now presenting to the emergency department for evaluation of vision loss.  Patient had been in his usual state until the night of 10/22/2017 when he experienced a right temporal headache and loss of vision.  He had never experienced similar symptoms previously.  There was no preceding fall or trauma and no recent illness.  He saw his primary ophthalmologist earlier on the day of presentation, was noted to have complete vision loss involving the right eye and near complete loss in the left.  He was sent to a retinal specialist, suspected to have arteritis or CVA as etiology, and was directed to the emergency department for further evaluation and with recommendation for empiric high-dose steroids for possible GCA.  ED Course: Upon arrival to the ED, patient is found to be afebrile, saturating well on room air, and reviewed noncontrast head CT is negative for acute intracranial abnormality.  Chemistry panel is notable for a creatinine of 1.89, similar to priors.  CBC features a mild leukocytosis and ESR is elevated to 70.  ED physician discussed the case with neurology and MRI brain, MRA head, and MRI orbits was recommended.  He was treated with 1 g of IV Solu-Medrol in the ED. Patient remains hemodynamically stable, in no apparent respiratory distress, and will be admitted to the telemetry unit for ongoing evaluation and management of vision loss suspected secondary to GCA versus CVA.  Review of Systems:  All other systems reviewed and apart from HPI, are negative.  Past Medical History:  Diagnosis Date  . ALLERGIC RHINITIS 09/29/2007  . Aortic stenosis 02/05/2017  . BACK PAIN 01/31/2009  .  BENIGN PROSTATIC HYPERTROPHY 05/12/2007  . CHEST PAIN 01/31/2008  . CHRONIC OBSTRUCTIVE PULMONARY DISEASE, ACUTE EXACERBATION 08/17/2007  . CONJUNCTIVITIS, ALLERGIC, CHRONIC 03/24/2010  . CONSTIPATION 02/19/2010  . COPD 05/12/2007  . GERD 02/23/2010  . GOUT 07/04/2010  . History of CVA (cerebrovascular accident) 05/03/2011  . HYPERLIPIDEMIA 05/12/2007  . HYPERTENSION 05/12/2007  . NEPHROLITHIASIS, HX OF 08/17/2007  . OBESITY 05/12/2007  . PEPTIC ULCER DISEASE 05/12/2007  . PERIPHERAL VASCULAR DISEASE 05/12/2007  . Personal history of unspecified circulatory disease 05/12/2007  . PLANTAR FASCIITIS, RIGHT 09/27/2008  . RENAL CELL CANCER 05/12/2007  . RENAL INSUFFICIENCY 08/17/2007  . SKIN LESION 11/07/2010  . VERTIGO 09/29/2007  . WRIST PAIN, RIGHT 02/26/2009    Past Surgical History:  Procedure Laterality Date  . CATARACT EXTRACTION    . ear surgury    . NEPHRECTOMY    . NEPHROURETERECTOMY    . s.p left knee replacement  10/08  . s/p bilat fem pop bypass       reports that he has been smoking.  he has never used smokeless tobacco. He reports that he does not drink alcohol or use drugs.  Allergies  Allergen Reactions  . Sulfonamide Derivatives     REACTION: rash    Family History  Problem Relation Age of Onset  . Cancer Mother        colon cancer  . Heart disease Brother   . Alcohol abuse Brother   . Cancer Daughter        bladder cancer  . Cancer Other  grandson leukemia and BMT  . Cancer Other        sibling with pancreatic cancer     Prior to Admission medications   Medication Sig Start Date End Date Taking? Authorizing Provider  allopurinol (ZYLOPRIM) 100 MG tablet Take 1 tablet (100 mg total) by mouth daily. 01/14/17  Yes [name], MD  amLODipine (NORVASC) 5 MG tablet Take 1 tablet (5 mg total) by mouth daily. 01/06/17  Yes [name], MD  aspirin EC 81 MG tablet Take 1 tablet (81 mg total) by mouth daily. 02/16/17  Yes Camnitz, Will Hassell Done, MD  cetirizine  (ZYRTEC) 10 MG tablet Take 10 mg by mouth daily as needed for allergies.    Yes [provider]  hydrochlorothiazide (HYDRODIURIL) 25 MG tablet Take 1 tablet (25 mg total) by mouth daily. 01/14/17  Yes [name], MD  indomethacin (INDOCIN) 50 MG capsule Take 50 mg by mouth 3 (three) times daily with meals. For acute gout only   Yes [provider]  mirtazapine (REMERON) 15 MG tablet Take 1 tablet (15 mg total) by mouth at bedtime. 10/18/17  Yes [name], MD  omeprazole (PRILOSEC) 20 MG capsule Take 1 capsule (20 mg total) by mouth daily. 01/14/17  Yes [name], MD  polyethylene glycol [facility] Floria Raveling) packet Take 17 g by mouth daily as needed for mild constipation.   Yes [provider]  pravastatin (PRAVACHOL) 40 MG tablet Take 1 tablet (40 mg total) by mouth every evening. 01/13/17  Yes [name], MD    Physical Exam: Vitals:   10/25/17 2306 10/26/17 0007 10/26/17 0008 10/26/17 0045  BP: (!) 157/77 (!) 166/79  (!) 141/88  Pulse: 78  (!) 50 87  Resp: 16     Temp:      TempSrc:      SpO2: 95%  90% 97%      Constitutional: NAD, calm  Eyes: PERTLA, lids and conjunctivae normal ENMT: Mucous membranes are moist. Posterior pharynx clear of any exudate or lesions.   Neck: normal, supple, no masses, no thyromegaly Respiratory: clear to auscultation bilaterally, no wheezing, no crackles. Normal respiratory effort.    Cardiovascular: S1 & S2 heard, regular rate and rhythm. No significant JVD. Abdomen: No distension, no tenderness, no masses palpated. Bowel sounds normal.  Musculoskeletal: no clubbing / cyanosis. No joint deformity upper and lower extremities.    Skin: no significant rashes, lesions, ulcers. Warm, dry, well-perfused. Neurologic: Gross bilateral vision loss. Gross hearing deficit. Sensation to light touch intact. Strength 5/5 in all 4 limbs.  Psychiatric: Alert and oriented x 3. Very pleasant and cooperative.     Labs on  Admission: I have personally reviewed following labs and imaging studies  CBC: Recent Labs  Lab 10/25/17 1617 10/25/17 1626  WBC 10.6*  --   NEUTROABS 5.8  --   HGB 13.2 14.6  HCT 39.3 43.0  MCV 92.9  --   PLT 393  --    Basic Metabolic Panel: Recent Labs  Lab 10/25/17 1617 10/25/17 1626  NA 136 137  K 4.4 4.3  CL 99* 98*  CO2 25  --   GLUCOSE 109* 106*  BUN 21* 23*  CREATININE 1.89* 1.90*  CALCIUM 9.5  --    GFR: Estimated Creatinine Clearance: 22.5 mL/min (A) (by C-G formula based on SCr of 1.9 mg/dL (H)). Liver Function Tests: Recent Labs  Lab 10/25/17 1617  AST 24  ALT 10*  ALKPHOS 78  BILITOT 0.6  PROT 6.5  ALBUMIN 3.0*   No results for input(s): LIPASE, AMYLASE in the last 168 hours. No results for input(s): AMMONIA in the last 168 hours. Coagulation Profile: Recent Labs  Lab 10/25/17 1617  INR 1.05   Cardiac Enzymes: No results for input(s): CKTOTAL, CKMB, CKMBINDEX, TROPONINI in the last 168 hours. BNP (last 3 results) No results for input(s): PROBNP in the last 8760 hours. HbA1C: No results for input(s): HGBA1C in the last 72 hours. CBG: No results for input(s): GLUCAP in the last 168 hours. Lipid Profile: No results for input(s): CHOL, HDL, LDLCALC, TRIG, CHOLHDL, LDLDIRECT in the last 72 hours. Thyroid Function Tests: No results for input(s): TSH, T4TOTAL, FREET4, T3FREE, THYROIDAB in the last 72 hours. Anemia Panel: No results for input(s): VITAMINB12, FOLATE, FERRITIN, TIBC, IRON, RETICCTPCT in the last 72 hours. Urine analysis:    Component Value Date/Time   COLORURINE YELLOW 10/18/2017 1624   APPEARANCEUR CLEAR 10/18/2017 1624   LABSPEC 1.020 10/18/2017 1624   PHURINE 6.0 10/18/2017 1624   GLUCOSEU NEGATIVE 10/18/2017 1624   HGBUR NEGATIVE 10/18/2017 1624   BILIRUBINUR NEGATIVE 10/18/2017 1624   KETONESUR NEGATIVE 10/18/2017 1624   PROTEINUR NEGATIVE 10/18/2007 1545   UROBILINOGEN 0.2 10/18/2017 1624   NITRITE NEGATIVE  10/18/2017 1624   LEUKOCYTESUR NEGATIVE 10/18/2017 1624   Sepsis Labs: '@LABRCNTIP'$ (procalcitonin:4,lacticidven:4) )No results found for this or any previous visit (from the past 240 hour(s)).   Radiological Exams on Admission: Ct Head Wo Contrast  Result Date: 10/25/2017 CLINICAL DATA:  84 year old male with visual loss in both eyes for the past 4 days. Possible temporal arteritis. Initial encounter. EXAM: CT HEAD WITHOUT CONTRAST TECHNIQUE: Contiguous axial images were obtained from the base of the skull through the vertex without intravenous contrast. COMPARISON:  None. FINDINGS: Brain: No intracranial hemorrhage or CT evidence of large acute infarct. Prominent chronic microvascular changes. Global atrophy. No intracranial mass lesion noted on this unenhanced exam. Vascular: Vascular calcifications. Skull: Negative. Sinuses/Orbits: No primary orbital abnormality noted. Visualized sinuses clear. IMPRESSION: No acute intracranial abnormality noted. Specifically no evidence of intracranial hemorrhage or CT findings to suggest large acute infarct. Prominent chronic microvascular changes. Atrophy. Electronically Signed   By: Genia Del M.D.   On: 10/25/2017 17:38    EKG: Ordered and pending.  Assessment/Plan   1. Acute vision loss, bilateral  - Developed acute vision loss the night of 10/22/17, complete on right and near-complete on left  - He saw ophthalmology, suspected to have CVA or arteritis, and directed to ED  - He had a right temporal headache earlier in the course; ESR is 70, CRP pending   - Treated with 1 g IV Solu-Medrol in ED per ophthalmology recs  - Neurology recommended MRI brain, MRA head, and MR orbits  - Continue neuro checks, follow-up imaging, continue high-dose steroid    2. CKD stage III-IV  - SCr is 1.89 on admission, similar to priors  - Renally-dose medications, avoid nephrotoxins   3. Hypertension  - BP at goal  - Hold Norvasc and HCTZ while evaluating for CVA      4. History of CVA  - Currently evaluating vision loss as above, no other focal deficits identified  - Continue aspirin and statin    5. GERD - Managed with daily PPI at home, will increase to BID while on high-dose steroid     DVT prophylaxis: sq heparin  Code Status: Full  Family Communication: Son and wife updated at bedside Disposition Plan:  Admit to telemetry Consults called: ED physician discussed with neurology Admission status: Inpatient    Vianne Bulls, MD [facility] Pager [phone]  If 7PM-7AM, please contact night-coverage [email or web address] Password [email or web address]  10/26/2017, 1:56 AM

## 2017-10-26 NOTE — ED Notes (Signed)
Pt sleeping, arouses briefly to voice, returns to sleep quickly.

## 2017-10-26 NOTE — ED Provider Notes (Signed)
[facility] EMERGENCY DEPARTMENT Provider Note   CSN: [id number] Arrival date & time: 10/25/17  1530     History   Chief Complaint Chief Complaint  Patient presents with  . Loss of Vision    HPI [name] is a 84 y.o. male.  Patient presents with visual impairment that onset acutely on February 22.  Patient did not see ophthalmology until today.  He reports he has lost vision in his right eye completely and has very decreased vision in his left.  He denies any headache or eye pain.  Denies any trauma.  Denies any focal weakness, numbness or tingling.  Patient sought to ophthalmologist today, Dr. Gershon Crane and Dr. Zadie Rhine.  They were told to come to the ED because he had a "stroke behind his eye".  Patient denies any change in his vision since acutely on 8 PM in February 22.  He denies any difficulty speaking or difficulty swallowing. Denies trauma.   The history is provided by the patient and the spouse.    Past Medical History:  Diagnosis Date  . ALLERGIC RHINITIS 09/29/2007  . Aortic stenosis 02/05/2017  . BACK PAIN 01/31/2009  . BENIGN PROSTATIC HYPERTROPHY 05/12/2007  . CHEST PAIN 01/31/2008  . CHRONIC OBSTRUCTIVE PULMONARY DISEASE, ACUTE EXACERBATION 08/17/2007  . CONJUNCTIVITIS, ALLERGIC, CHRONIC 03/24/2010  . CONSTIPATION 02/19/2010  . COPD 05/12/2007  . GERD 02/23/2010  . GOUT 07/04/2010  . History of CVA (cerebrovascular accident) 05/03/2011  . HYPERLIPIDEMIA 05/12/2007  . HYPERTENSION 05/12/2007  . NEPHROLITHIASIS, HX OF 08/17/2007  . OBESITY 05/12/2007  . PEPTIC ULCER DISEASE 05/12/2007  . PERIPHERAL VASCULAR DISEASE 05/12/2007  . Personal history of unspecified circulatory disease 05/12/2007  . PLANTAR FASCIITIS, RIGHT 09/27/2008  . RENAL CELL CANCER 05/12/2007  . RENAL INSUFFICIENCY 08/17/2007  . SKIN LESION 11/07/2010  . VERTIGO 09/29/2007  . WRIST PAIN, RIGHT 02/26/2009    Patient Active Problem List   Diagnosis Date Noted  . Weight loss 10/18/2017  .  Flank pain 10/18/2017  . Right arm pain 10/18/2017  . Depression 10/18/2017  . Aortic stenosis 02/05/2017  . Heart murmur 01/05/2017  . Skin lesion 01/04/2016  . Low back pain 12/21/2013  . Lumbar radiculitis 12/28/2012  . Smoker unmotivated to quit 11/14/2012  . Cough 11/06/2011  . Leukocytosis 11/06/2011  . History of CVA (cerebrovascular accident) 05/03/2011  . Preventative health care 05/03/2011  . SKIN LESION 11/07/2010  . GOUT 07/04/2010  . CONJUNCTIVITIS, ALLERGIC, CHRONIC 03/24/2010  . GERD 02/23/2010  . Constipation 02/19/2010  . WRIST PAIN, RIGHT 02/26/2009  . BACK PAIN 01/31/2009  . PLANTAR FASCIITIS, RIGHT 09/27/2008  . CHEST PAIN 01/31/2008  . ALLERGIC RHINITIS 09/29/2007  . VERTIGO 09/29/2007  . CKD (chronic kidney disease) 08/17/2007  . NEPHROLITHIASIS, HX OF 08/17/2007  . Malignant neoplasm of kidney excluding renal pelvis ([place]) 05/12/2007  . Hyperlipidemia 05/12/2007  . OBESITY 05/12/2007  . Essential hypertension 05/12/2007  . PERIPHERAL VASCULAR DISEASE 05/12/2007  . COPD (chronic obstructive pulmonary disease) ([place]) 05/12/2007  . PEPTIC ULCER DISEASE 05/12/2007  . BENIGN PROSTATIC HYPERTROPHY 05/12/2007    Past Surgical History:  Procedure Laterality Date  . CATARACT EXTRACTION    . ear surgury    . NEPHRECTOMY    . NEPHROURETERECTOMY    . s.p left knee replacement  10/08  . s/p bilat fem pop bypass         Home Medications    Prior to Admission medications   Medication Sig Start Date End  Date Taking? Authorizing Provider  allopurinol (ZYLOPRIM) 100 MG tablet Take 1 tablet (100 mg total) by mouth daily. 01/14/17   [name], MD  amLODipine (NORVASC) 5 MG tablet Take 1 tablet (5 mg total) by mouth daily. 01/06/17   [name], MD  aspirin EC 81 MG tablet Take 1 tablet (81 mg total) by mouth daily. 02/16/17   Camnitz, Ocie Doyne, MD  cetirizine (ZYRTEC) 10 MG tablet Take 10 mg by mouth daily as needed.     [provider]    diphenhydrAMINE (BENADRYL) 25 mg capsule Take 25 mg by mouth daily as needed.    [provider]  hydrochlorothiazide (HYDRODIURIL) 25 MG tablet Take 1 tablet (25 mg total) by mouth daily. 01/14/17   [name], MD  indomethacin (INDOCIN) 50 MG capsule Take 50 mg by mouth 3 (three) times daily with meals. For acute gout only    [provider]  meclizine (ANTIVERT) 12.5 MG tablet TAKE 1 TABLET FOUR TIMES DAILY AS NEEDED 02/22/17   [name], MD  mirtazapine (REMERON) 15 MG tablet Take 1 tablet (15 mg total) by mouth at bedtime. 10/18/17   [name], MD  omeprazole (PRILOSEC) 20 MG capsule Take 1 capsule (20 mg total) by mouth daily. 01/14/17   [name], MD  polyethylene glycol powder [facility]) powder Take 17 g by mouth daily as needed.     [provider]  pravastatin (PRAVACHOL) 40 MG tablet Take 1 tablet (40 mg total) by mouth every evening. 01/13/17   [name], MD    Family History Family History  Problem Relation Age of Onset  . Cancer Mother        colon cancer  . Heart disease Brother   . Alcohol abuse Brother   . Cancer Daughter        bladder cancer  . Cancer Other        grandson leukemia and BMT  . Cancer Other        sibling with pancreatic cancer    Social History Social History   Tobacco Use  . Smoking status: Current Every Day Smoker  . Smokeless tobacco: Never Used  Substance Use Topics  . Alcohol use: No  . Drug use: No     Allergies   Sulfonamide derivatives   Review of Systems Review of Systems  Constitutional: Negative for activity change, appetite change, fatigue and fever.  HENT: Negative for congestion and rhinorrhea.   Eyes: Positive for visual disturbance.  Respiratory: Negative for cough, chest tightness and shortness of breath.   Cardiovascular: Negative for chest pain.  Gastrointestinal: Negative for abdominal pain, nausea and vomiting.  Genitourinary: Negative for dysuria, hematuria and  testicular pain.  Musculoskeletal: Negative for arthralgias and myalgias.  Skin: Negative for rash.  Neurological: Negative for dizziness, weakness and headaches.    all other systems are negative except as noted in the HPI and PMH.    Physical Exam Updated Vital Signs BP (!) 166/79   Pulse (!) 50   Temp 97.8 F (36.6 C) (Oral)   Resp 16   SpO2 90%   Physical Exam  Constitutional: He is oriented to person, place, and time. He appears well-developed and well-nourished. No distress.  Hard of hearing  HENT:  Head: Normocephalic and atraumatic.  Mouth/Throat: Oropharynx is clear and moist. No oropharyngeal exudate.  Eyes: Conjunctivae and EOM are normal.  Pupils are pharmacologically dilated bilaterally and minimally reactive Optic disks appear  pale bilaterally  No temporal artery tenderness  Neck: Normal range of motion. Neck supple.  No meningismus.  Cardiovascular: Normal rate, regular rhythm, normal heart sounds and intact distal pulses.  No murmur heard. Pulmonary/Chest: Effort normal and breath sounds normal. No respiratory distress. He exhibits no tenderness.  Abdominal: Soft. There is no tenderness. There is no rebound and no guarding.  Musculoskeletal: Normal range of motion. He exhibits no edema or tenderness.  Neurological: He is alert and oriented to person, place, and time. No cranial nerve deficit. He exhibits normal muscle tone. Coordination normal.  No ataxia on finger to nose bilaterally. No pronator drift. 5/5 strength throughout. CN 2-12 intact.Equal grip strength. Sensation intact.   Skin: Skin is warm.  Psychiatric: He has a normal mood and affect. His behavior is normal.  Nursing note and vitals reviewed.    ED Treatments / Results  Labs (all labs ordered are listed, but only abnormal results are displayed) Labs Reviewed  SEDIMENTATION RATE - Abnormal; Notable for the following components:      Result Value   Sed Rate 70 (*)    All other components  within normal limits  CBC - Abnormal; Notable for the following components:   WBC 10.6 (*)    All other components within normal limits  COMPREHENSIVE METABOLIC PANEL - Abnormal; Notable for the following components:   Chloride 99 (*)    Glucose, Bld 109 (*)    BUN 21 (*)    Creatinine, Ser 1.89 (*)    Albumin 3.0 (*)    ALT 10 (*)    GFR calc non Af Amer 30 (*)    GFR calc Af Amer 34 (*)    All other components within normal limits  I-STAT CHEM 8, ED - Abnormal; Notable for the following components:   Chloride 98 (*)    BUN 23 (*)    Creatinine, Ser 1.90 (*)    Glucose, Bld 106 (*)    All other components within normal limits  PROTIME-INR  APTT  DIFFERENTIAL  ETHANOL  RAPID URINE DRUG SCREEN, HOSP PERFORMED  URINALYSIS, ROUTINE W REFLEX MICROSCOPIC  C-REACTIVE PROTEIN  I-STAT TROPONIN, ED    EKG  EKG Interpretation None       Radiology Ct Head Wo Contrast  Result Date: 10/25/2017 CLINICAL DATA:  84 year old male with visual loss in both eyes for the past 4 days. Possible temporal arteritis. Initial encounter. EXAM: CT HEAD WITHOUT CONTRAST TECHNIQUE: Contiguous axial images were obtained from the base of the skull through the vertex without intravenous contrast. COMPARISON:  None. FINDINGS: Brain: No intracranial hemorrhage or CT evidence of large acute infarct. Prominent chronic microvascular changes. Global atrophy. No intracranial mass lesion noted on this unenhanced exam. Vascular: Vascular calcifications. Skull: Negative. Sinuses/Orbits: No primary orbital abnormality noted. Visualized sinuses clear. IMPRESSION: No acute intracranial abnormality noted. Specifically no evidence of intracranial hemorrhage or CT findings to suggest large acute infarct. Prominent chronic microvascular changes. Atrophy. Electronically Signed   By: Genia Del M.D.   On: 10/25/2017 17:38    Procedures Procedures (including critical care time)  Medications Ordered in ED Medications -  No data to display   Initial Impression / Assessment and Plan / ED Course  I have reviewed the triage vital signs and the nursing notes.  Pertinent labs & imaging results that were available during my care of the patient were reviewed by me and considered in my medical decision making (see chart for details).    Patient  sent from ophthalmology with visual loss onset 4 days ago.  Code stroke not activated due to delay in presentation.  CT done in triage is negative. Doubt bilateral temporal arteritis the sed rate is 70 patient has no temporal artery tenderness bilaterally  Per medical record, Dr. Zadie Rhine is concerned for" pt has either Bilateral ischemic optic nerves, or bilat central retinal occlusions".  Discussed with Dr. Cheral Marker of neurology.  He feels it is unlikely that patient is having a stroke causing bilateral eye complaints.  Does agree with MRI brain and orbits with and without contrast as well as MRA.  States to be called if the study is positive for stroke.  Will start IV Solu-Medrol 1 g and admit to hospitalist.   MRIs were ordered.  Patient remained stable and will be admitted to medicine service.  Discussed with Dr. Myna Hidalgo.   Final Clinical Impressions(s) / ED Diagnoses   Final diagnoses:  Visual loss    ED Discharge Orders    None       Kree Armato, Annie Main, MD 10/26/17 [phone]

## 2017-10-26 NOTE — ED Notes (Signed)
Pt arrived via stretcher to unti, alertx4 at this time. Family at bedside

## 2017-10-26 NOTE — Progress Notes (Signed)
PT Cancellation Note  Patient Details Name: AHMADOU BOLZ MRN: [id number] DOB: [birth year]   Cancelled Treatment:    Reason Eval/Treat Not Completed: Other (comment) RN reporting pt sleeping as pt had difficult night. Requested to allow pt to sleep. Will follow up as schedule allows.   Leighton Ruff, PT, DPT  Acute Rehabilitation Services  Pager: [phone]    Rudean Hitt 10/26/2017, 12:43 PM

## 2017-10-26 NOTE — ED Notes (Signed)
Patient transported to MRI 

## 2017-10-27 ENCOUNTER — Inpatient Hospital Stay (HOSPITAL_COMMUNITY): Payer: Medicare HMO | Admitting: Anesthesiology

## 2017-10-27 ENCOUNTER — Encounter (HOSPITAL_COMMUNITY): Payer: Self-pay | Admitting: Anesthesiology

## 2017-10-27 ENCOUNTER — Encounter (HOSPITAL_COMMUNITY)
Admission: EM | Disposition: A | Discharge status: Home Health | Payer: Self-pay | Source: Home / Self Care | Attending: Internal Medicine

## 2017-10-27 DIAGNOSIS — M316 Other giant cell arteritis: Secondary | ICD-10-CM

## 2017-10-27 DIAGNOSIS — H547 Unspecified visual loss: Secondary | ICD-10-CM

## 2017-10-27 HISTORY — PX: ARTERY BIOPSY: SHX891

## 2017-10-27 LAB — BASIC METABOLIC PANEL
ANION GAP: 13 (ref 5–15)
BUN: 30 mg/dL — ABNORMAL HIGH (ref 6–20)
CHLORIDE: 102 mmol/L (ref 101–111)
CO2: 21 mmol/L — ABNORMAL LOW (ref 22–32)
CREATININE: 1.78 mg/dL — AB (ref 0.61–1.24)
Calcium: 8.7 mg/dL — ABNORMAL LOW (ref 8.9–10.3)
GFR calc Af Amer: 37 mL/min — ABNORMAL LOW (ref >60)
GFR calc non Af Amer: 32 mL/min — ABNORMAL LOW (ref >60)
Glucose, Bld: 161 mg/dL — ABNORMAL HIGH (ref 65–99)
Potassium: 3.8 mmol/L (ref 3.5–5.1)
SODIUM: 136 mmol/L (ref 135–145)

## 2017-10-27 LAB — CBC
HCT: 33.2 % — ABNORMAL LOW (ref 39.0–52.0)
HEMOGLOBIN: 11.2 g/dL — AB (ref 13.0–17.0)
MCH: 31.1 pg (ref 26.0–34.0)
MCHC: 33.7 g/dL (ref 30.0–36.0)
MCV: 92.2 fL (ref 78.0–100.0)
PLATELETS: 369 10*3/uL (ref 150–400)
RBC: 3.6 MIL/uL — ABNORMAL LOW (ref 4.22–5.81)
RDW: 14.5 % (ref 11.5–15.5)
WBC: 16.4 10*3/uL — AB (ref 4.0–10.5)

## 2017-10-27 LAB — PROTIME-INR
INR: 1.21
Prothrombin Time: 15.2 seconds (ref 11.4–15.2)

## 2017-10-27 LAB — HEMOGLOBIN A1C
Hgb A1c MFr Bld: 5.5 % (ref 4.8–5.6)
Mean Plasma Glucose: 111 mg/dL

## 2017-10-27 LAB — MRSA PCR SCREENING: MRSA BY PCR: NEGATIVE

## 2017-10-27 SURGERY — BIOPSY TEMPORAL ARTERY
Anesthesia: General | Site: Face | Laterality: Bilateral

## 2017-10-27 MED ORDER — ONDANSETRON HCL 4 MG/2ML IJ SOLN
INTRAMUSCULAR | Status: DC | PRN
  Administered 2017-10-27: 4 mg via INTRAVENOUS

## 2017-10-27 MED ORDER — FENTANYL CITRATE (PF) 100 MCG/2ML IJ SOLN: 25.0000 ug | INTRAMUSCULAR | Status: DC | PRN

## 2017-10-27 MED ORDER — PROPOFOL 10 MG/ML IV BOLUS
INTRAVENOUS | Status: AC
  Filled 2017-10-27: qty 20

## 2017-10-27 MED ORDER — LACTATED RINGERS IV SOLN: INTRAVENOUS | Status: DC

## 2017-10-27 MED ORDER — PHENYLEPHRINE HCL 10 MG/ML IJ SOLN
INTRAMUSCULAR | Status: AC
  Filled 2017-10-27: qty 1

## 2017-10-27 MED ORDER — SODIUM CHLORIDE 0.9 % IV SOLN
INTRAVENOUS | Status: DC
  Administered 2017-10-27 (×3): via INTRAVENOUS

## 2017-10-27 MED ORDER — PROPOFOL 10 MG/ML IV BOLUS
INTRAVENOUS | Status: DC | PRN
  Administered 2017-10-27: 80 mg via INTRAVENOUS

## 2017-10-27 MED ORDER — LIDOCAINE 2% (20 MG/ML) 5 ML SYRINGE
INTRAMUSCULAR | Status: AC
  Filled 2017-10-27: qty 10

## 2017-10-27 MED ORDER — FENTANYL CITRATE (PF) 250 MCG/5ML IJ SOLN
INTRAMUSCULAR | Status: AC
  Filled 2017-10-27: qty 5

## 2017-10-27 MED ORDER — PHENYLEPHRINE HCL 10 MG/ML IJ SOLN
INTRAMUSCULAR | Status: DC | PRN
  Administered 2017-10-27: 40 ug via INTRAVENOUS
  Administered 2017-10-27 (×2): 80 ug via INTRAVENOUS

## 2017-10-27 MED ORDER — ESMOLOL HCL 100 MG/10ML IV SOLN
INTRAVENOUS | Status: AC
  Filled 2017-10-27: qty 20

## 2017-10-27 MED ORDER — 0.9 % SODIUM CHLORIDE (POUR BTL) OPTIME
TOPICAL | Status: DC | PRN
  Administered 2017-10-27: 1000 mL

## 2017-10-27 MED ORDER — EPHEDRINE 5 MG/ML INJ
INTRAVENOUS | Status: AC
  Filled 2017-10-27: qty 40

## 2017-10-27 MED ORDER — EPHEDRINE SULFATE 50 MG/ML IJ SOLN
INTRAMUSCULAR | Status: DC | PRN
  Administered 2017-10-27 (×2): 10 mg via INTRAVENOUS

## 2017-10-27 MED ORDER — AMLODIPINE BESYLATE 5 MG PO TABS
5.0000 mg | ORAL_TABLET | Freq: Every day | ORAL | Status: DC
  Administered 2017-10-28: 5 mg via ORAL
  Filled 2017-10-27: qty 1

## 2017-10-27 MED ORDER — PHENYLEPHRINE 40 MCG/ML (10ML) SYRINGE FOR IV PUSH (FOR BLOOD PRESSURE SUPPORT)
PREFILLED_SYRINGE | INTRAVENOUS | Status: AC
  Filled 2017-10-27: qty 40

## 2017-10-27 MED ORDER — ONDANSETRON HCL 4 MG/2ML IJ SOLN
INTRAMUSCULAR | Status: AC
  Filled 2017-10-27: qty 6

## 2017-10-27 MED ORDER — FENTANYL CITRATE (PF) 100 MCG/2ML IJ SOLN
INTRAMUSCULAR | Status: DC | PRN
  Administered 2017-10-27 (×3): 25 ug via INTRAVENOUS
  Administered 2017-10-27: 50 ug via INTRAVENOUS

## 2017-10-27 MED ORDER — DEXAMETHASONE SODIUM PHOSPHATE 10 MG/ML IJ SOLN
INTRAMUSCULAR | Status: AC
  Filled 2017-10-27: qty 3

## 2017-10-27 MED ORDER — LIDOCAINE HCL (CARDIAC) 20 MG/ML IV SOLN
INTRAVENOUS | Status: DC | PRN
  Administered 2017-10-27: 40 mg via INTRAVENOUS

## 2017-10-27 SURGICAL SUPPLY — 43 items
ADH SKN CLS APL DERMABOND .7 (GAUZE/BANDAGES/DRESSINGS) ×2
BALL CTTN LRG ABS STRL LF (GAUZE/BANDAGES/DRESSINGS) ×1
CANISTER SUCT 3000ML PPV (MISCELLANEOUS) ×3 IMPLANT
CLIP TI WIDE RED SMALL 6 (CLIP) ×2 IMPLANT
CLIP VESOCCLUDE SM WIDE 6/CT (CLIP) ×3 IMPLANT
CONT SPEC 4OZ CLIKSEAL STRL BL (MISCELLANEOUS) ×5 IMPLANT
COTTONBALL LRG STERILE PKG (GAUZE/BANDAGES/DRESSINGS) ×3 IMPLANT
COVER SURGICAL LIGHT HANDLE (MISCELLANEOUS) ×3 IMPLANT
DECANTER SPIKE VIAL GLASS SM (MISCELLANEOUS) ×1 IMPLANT
DERMABOND ADVANCED (GAUZE/BANDAGES/DRESSINGS) ×4
DERMABOND ADVANCED .7 DNX12 (GAUZE/BANDAGES/DRESSINGS) ×1 IMPLANT
DRAPE FEMORAL ANGIO 80X135IN (DRAPES) ×2 IMPLANT
DRAPE LAPAROTOMY T 102X78X121 (DRAPES) ×1 IMPLANT
DRAPE SURG 17X23 STRL (DRAPES) ×4 IMPLANT
ELECT REM PT RETURN 9FT ADLT (ELECTROSURGICAL) ×3
ELECTRODE REM PT RTRN 9FT ADLT (ELECTROSURGICAL) ×1 IMPLANT
GLOVE BIOGEL PI IND STRL 7.5 (GLOVE) ×1 IMPLANT
GLOVE BIOGEL PI INDICATOR 7.5 (GLOVE) ×2
GLOVE SS N UNI LF 7.5 STRL (GLOVE) ×3 IMPLANT
GOWN STRL REUS W/ TWL LRG LVL3 (GOWN DISPOSABLE) ×1 IMPLANT
GOWN STRL REUS W/ TWL XL LVL3 (GOWN DISPOSABLE) ×1 IMPLANT
GOWN STRL REUS W/TWL LRG LVL3 (GOWN DISPOSABLE) ×3
GOWN STRL REUS W/TWL XL LVL3 (GOWN DISPOSABLE) ×3
KIT BASIN OR (CUSTOM PROCEDURE TRAY) ×3 IMPLANT
KIT ROOM TURNOVER OR (KITS) ×3 IMPLANT
LOOP VESSEL MINI RED (MISCELLANEOUS) ×3 IMPLANT
NDL HYPO 25GX1X1/2 BEV (NEEDLE) ×1 IMPLANT
NEEDLE HYPO 25GX1X1/2 BEV (NEEDLE) IMPLANT
NS IRRIG 1000ML POUR BTL (IV SOLUTION) ×3 IMPLANT
PACK GENERAL/GYN (CUSTOM PROCEDURE TRAY) ×3 IMPLANT
PAD ARMBOARD 7.5X6 YLW CONV (MISCELLANEOUS) ×6 IMPLANT
SPONGE LAP 4X18 X RAY DECT (DISPOSABLE) ×3 IMPLANT
SUCTION FRAZIER HANDLE 10FR (MISCELLANEOUS)
SUCTION TUBE FRAZIER 10FR DISP (MISCELLANEOUS) ×1 IMPLANT
SUT PROLENE 6 0 BV (SUTURE) IMPLANT
SUT SILK 3 0 (SUTURE) ×3
SUT SILK 3-0 18XBRD TIE 12 (SUTURE) ×1 IMPLANT
SUT VIC AB 3-0 SH 27 (SUTURE) ×3
SUT VIC AB 3-0 SH 27X BRD (SUTURE) ×1 IMPLANT
SUT VICRYL 4-0 PS2 18IN ABS (SUTURE) ×3 IMPLANT
SYR CONTROL 10ML LL (SYRINGE) ×1 IMPLANT
TOWEL GREEN STERILE (TOWEL DISPOSABLE) ×3 IMPLANT
WATER STERILE IRR 1000ML POUR (IV SOLUTION) ×1 IMPLANT

## 2017-10-27 NOTE — Progress Notes (Signed)
PT Cancellation Note  Patient Details Name: [name] MRN: [id number] DOB: 03-01-1925   Cancelled Treatment:    Reason Eval/Treat Not Completed: Patient at procedure or test/unavailable. Attempted at 8:30am and 11:14am. PT to return as able.  Kittie Plater, PT, DPT Pager #: [phone] Office #: [phone]    [place] 10/27/2017, 11:12 AM

## 2017-10-27 NOTE — Anesthesia Preprocedure Evaluation (Addendum)
Anesthesia Evaluation  Patient identified by MRN, date of birth, ID band  Reviewed: Allergy & Precautions, NPO status , Patient's Chart, lab work & pertinent test results  Airway Mallampati: I  TM Distance: >3 FB Neck ROM: Full    Dental  (+) Edentulous Upper, Edentulous Lower   Pulmonary COPD, Current Smoker,    breath sounds clear to auscultation       Cardiovascular hypertension, Pt. on medications + Peripheral Vascular Disease  + Valvular Problems/Murmurs AS  Rhythm:Regular Rate:Normal + Systolic murmurs    Neuro/Psych PSYCHIATRIC DISORDERS Depression  Neuromuscular disease    GI/Hepatic PUD, GERD  Medicated,  Endo/Other  negative endocrine ROS  Renal/GU Renal disease     Musculoskeletal negative musculoskeletal ROS (+)   Abdominal Normal abdominal exam  (+)   Peds  Hematology negative hematology ROS (+)   Anesthesia Other Findings - HLD  Reproductive/Obstetrics                            Anesthesia Physical Anesthesia Plan  ASA: III  Anesthesia Plan: General   Post-op Pain Management:    Induction: Intravenous  PONV Risk Score and Plan: 2 and Ondansetron and Treatment may vary due to age or medical condition  Airway Management Planned: LMA  Additional Equipment: None  Intra-op Plan:   Post-operative Plan: Extubation in OR  Informed Consent: I have reviewed the patients History and Physical, chart, labs and discussed the procedure including the risks, benefits and alternatives for the proposed anesthesia with the patient or authorized representative who has indicated his/her understanding and acceptance.   Dental advisory given  Plan Discussed with: CRNA  Anesthesia Plan Comments:         Anesthesia Quick Evaluation

## 2017-10-27 NOTE — Op Note (Signed)
    Patient name: [name] MRN: [id number] DOB: [birth year] Sex: male  10/27/2017 Pre-operative Diagnosis: Possible temporal arteritis Post-operative diagnosis:  Same Surgeon:  [name] Assistants: OR staff Procedure:   Bilateral temporal artery biopsy Anesthesia: General Blood Loss: Minimal Specimens: Left and right temporal arteries to pathology    Indications: The patient is being evaluated for possible temporal arteritis.  Temporal artery biopsy has been recommended.  Procedure:  The patient was identified in the holding area and taken to [place] [facility]  The patient was then placed supine on the table. general anesthesia was administered.  The patient was prepped and draped in the usual sterile fashion.  A time out was called and antibiotics were administered.  Hand-held Doppler was used to evaluate the location of the right temporal artery.  An incision was made anterior to this.  I initially dissected down and found a very small arterial branch surrounded by some inflammation.  This was dissected free and sent as a specimen.  I then used the Doppler to try and identify another artery.  And found one slightly superior to the incision.  I undermined the tissue up to the level of this artery and identified approximately 1 mm artery likely consistent with the temporal artery.  This was mobilized for approximately 3 cm and ligated proximally and distally with silk ties.  This was sent as a specimen.  Hemostasis was then achieved.  The incision was closed with 2 layers of 4-0 Vicryl.  Next attention was turned towards the left temporal artery.  It was identified with a hand-held Doppler.  An incision was made directly anterior to this.  The artery was then dissected free within the incision.  Approximately a 2-3 cm segment of the artery was isolated and ligated proximally and distally and sent as a specimen.  Hemostasis was achieved and the incision was closed with 2 layers of Vicryl  followed by Dermabond.  There were no immediate complications.   Disposition: To PACU stable   V. [name], M.D. [facility] Office: [phone] Pager:  [phone]

## 2017-10-27 NOTE — Transfer of Care (Signed)
Immediate Anesthesia Transfer of Care Note  Patient: [name]  Procedure(s) Performed: BILATERAL TEMPORAL ARTERY BIOPSY (Bilateral Face)  Patient Location: PACU  Anesthesia Type:General  Level of Consciousness: awake and patient cooperative  Airway & Oxygen Therapy: Patient Spontanous Breathing and Patient connected to nasal cannula oxygen  Post-op Assessment: Report given to RN and Post -op Vital signs reviewed and stable  Post vital signs: Reviewed  Last Vitals:  Vitals:   10/27/17 0032 10/27/17 1200  BP: (!) 158/65 (!) 98/44  Pulse: 90 87  Resp: 18 (!) 25  Temp: 36.7 C 36.6 C  SpO2: 96% 94%    Last Pain:  Vitals:   10/27/17 1200  TempSrc:   PainSc: 0-No pain         Complications: No apparent anesthesia complications

## 2017-10-27 NOTE — Progress Notes (Signed)
Talked to Dr. Rockne Menghini, patient should be able to go back to [facility].

## 2017-10-27 NOTE — Progress Notes (Signed)
Progress Note    [name]  AUQ:[id number] DOB: [birth year]  DOA: 10/25/2017 PCP: [name], MD    Brief Narrative:   Chief complaint: Follow-up vision loss  Medical records reviewed and are as summarized below:  [name] is an 84 y.o. male with a PMH of hypertension, stage III CKD and a history of stroke who was admitted 10/25/16 for evaluation of right sided temporal headache followed by vision loss.  In the ED, CT of the head was negative for acute intracranial abnormalities.  ESR was 70.  Given 1 g of IV Solu-Medrol for empiric treatment of temporal arteritis.  Assessment/Plan:   Principal Problem:   Vision loss, bilateral/ischemic optic neuropathy Evaluated by ophthalmology prior to admission with suspicion of CVA versus arteritis.  ESR 70, concerning for temporal arteritis. Subsequent MRI showed a right retrobulbar optic nerve inflammation and possible papilledema consistent with acute anterior ischemic optic neuropathy, but no evidence of acute stroke.  Right intracranial vertebral artery occlusion also noted. Dr. Lorraine Lax recommends high-dose steroids for 3 days, followed by a prolonged taper. For a temporal artery biopsy today.  PT/OT consultations requested.  Active Problems:   Essential hypertension No evidence of CVA on imaging.  Resume Norvasc.    CKD (chronic kidney disease), stage III-IV (HCC)  Creatinine consistent with usual baseline values.    History of CVA (cerebrovascular accident) No acute CVA on imaging.  Continue risk factor modification with aspirin and statins.  Body mass index is 24.79 kg/m.   Family Communication/Anticipated D/C date and plan/Code Status   DVT prophylaxis: Heparin ordered. Code Status: Full Code.  Family Communication: Daughter updated at bedside 10/26/17. Disposition Plan: Likely will need rehab.  Anticipate at least 2 more days in the hospital while receiving high-dose steroids.   Medical Consultants:     Neurology   Anti-Infectives:    None  Subjective:   No new complaints.  No change in vision.  Objective:    Vitals:   10/26/17 1500 10/26/17 1706 10/26/17 2032 10/27/17 0032  BP: (!) 144/62 (!) 167/58 (!) 142/68 (!) 158/65  Pulse:  86 89 90  Resp: '18 16 18 18  '$ Temp:  98 F (36.7 C) 98 F (36.7 C) 98.1 F (36.7 C)  TempSrc:  Oral Oral Oral  SpO2:  95% 95% 96%  Weight:      Height:        Intake/Output Summary (Last 24 hours) at 10/27/2017 6389 Last data filed at 10/27/2017 0032 Gross per 24 hour  Intake 240 ml  Output 400 ml  Net -160 ml   Filed Weights   10/26/17 0203  Weight: 67.6 kg (149 lb)    Exam: General: Elderly man in no acute distress. Cardiovascular: Regular rate, and rhythm with a grade 2 systolic ejection murmur. Lungs: Clear to auscultation bilaterally. Abdomen: Soft, nontender, nondistended with normal active bowel sounds. Neurological: No change in visual defects. Skin: No rashes. Extremities: No clubbing or cyanosis. No edema. Pedal pulses 2+.  Data Reviewed:   I have personally reviewed following labs and imaging studies:  Labs: Labs show the following:   Basic Metabolic Panel: Recent Labs  Lab 10/25/17 1617 10/25/17 1626 10/26/17 0217  NA 136 137 136  K 4.4 4.3 3.7  CL 99* 98* 101  CO2 25  --  22  GLUCOSE 109* 106* 127*  BUN 21* 23* 24*  CREATININE 1.89* 1.90* 1.87*  CALCIUM 9.5  --  8.9   GFR Estimated  Creatinine Clearance: 22.8 mL/min (A) (by C-G formula based on SCr of 1.87 mg/dL (H)). Liver Function Tests: Recent Labs  Lab 10/25/17 1617  AST 24  ALT 10*  ALKPHOS 78  BILITOT 0.6  PROT 6.5  ALBUMIN 3.0*   Coagulation profile Recent Labs  Lab 10/25/17 1617  INR 1.05    CBC: Recent Labs  Lab 10/25/17 1617 10/25/17 1626 10/26/17 0217  WBC 10.6*  --  13.6*  NEUTROABS 5.8  --  8.7*  HGB 13.2 14.6 12.4*  HCT 39.3 43.0 36.5*  MCV 92.9  --  91.3  PLT 393  --  360    CBG: Recent Labs  Lab  10/26/17 0802  GLUCAP 158*   Lipid Profile: Recent Labs    10/26/17 0217  CHOL 101  HDL 37*  LDLCALC 43  TRIG 103  CHOLHDL 2.7    Microbiology Recent Results (from the past 240 hour(s))  MRSA PCR Screening     Status: None   Collection Time: 10/27/17  3:12 AM  Result Value Ref Range Status   MRSA by PCR NEGATIVE NEGATIVE Final    Comment:        The GeneXpert MRSA Assay (FDA approved for NASAL specimens only), is one component of a comprehensive MRSA colonization surveillance program. It is not intended to diagnose MRSA infection nor to guide or monitor treatment for MRSA infections. Performed at [facility], [place] [address] [place], [place] [address]     Procedures and diagnostic studies:  Ct Head Wo Contrast  Result Date: 10/25/2017 CLINICAL DATA:  84 year old male with visual loss in both eyes for the past 4 days. Possible temporal arteritis. Initial encounter. EXAM: CT HEAD WITHOUT CONTRAST TECHNIQUE: Contiguous axial images were obtained from the base of the skull through the vertex without intravenous contrast. COMPARISON:  None. FINDINGS: Brain: No intracranial hemorrhage or CT evidence of large acute infarct. Prominent chronic microvascular changes. Global atrophy. No intracranial mass lesion noted on this unenhanced exam. Vascular: Vascular calcifications. Skull: Negative. Sinuses/Orbits: No primary orbital abnormality noted. Visualized sinuses clear. IMPRESSION: No acute intracranial abnormality noted. Specifically no evidence of intracranial hemorrhage or CT findings to suggest large acute infarct. Prominent chronic microvascular changes. Atrophy. Electronically Signed   By: Genia Del M.D.   On: 10/25/2017 17:38   Mr Angiogram Head Wo Contrast  Result Date: 10/26/2017 CLINICAL DATA:  84 y/o M; 10/22/2017 right temporal headache and loss of vision. EXAM: MRI HEAD WITHOUT CONTRAST MRI ORBITS WITHOUT CONTRAST MRA WITHOUT CONTRAST TECHNIQUE: Multiplanar,  multiecho pulse sequences of the brain and surrounding structures were obtained without intravenous contrast. Thin T2 coronal and axial sequences were obtained through the orbits. Angiographic images of the head were obtained without intravenous contrast. Patient declined to continue the examination. COMPARISON:  None. FINDINGS: MRI HEAD FINDINGS Brain: No acute infarction, hemorrhage, hydrocephalus, extra-axial collection or mass lesion. Patchy confluentnonspecific foci of T2 FLAIR hyperintense signal abnormality in subcortical and periventricular white matter are compatible withmoderate to severechronic microvascular ischemic changes for age. Moderatebrain parenchymal volume loss. Vascular: Loss of normal right vertebral artery flow void. Skull and upper cervical spine: Normal marrow signal. 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 MRI ORBITS FINDINGS Orbits: Limited to thin axial and coronal T2 weighted sequences. Bilateral intra-ocular lens replacement. Question punctate increased T2 signal within the retrobulbar optic nerve, possible papilledema. Punctate reduced diffusion similar location on MRI of the brain (series 3, image 17 in series 12 image 10). No abnormal signal within the orbital, canalicular,  or cisternal segments of the optic nerves identified. No abnormal signal of optic chiasm or radiations identified. Visualized sinuses: No significant abnormal signal. Soft tissues: Negative. Limited intracranial: As above. MRA HEAD FINDINGS Internal carotid arteries:  Patent. Anterior cerebral arteries:  Patent. Middle cerebral arteries: Patent. Anterior communicating artery: Patent. Posterior communicating arteries: Small right. Probable diminutive left. Posterior cerebral arteries:  Patent. Basilar artery:  Patent. Vertebral arteries: Right V4 segment occlusion, age indeterminate. Patent dominant left vertebral artery. No evidence of high-grade stenosis, large vessel occlusion, or aneurysm unless noted above. IMPRESSION:  1. Punctate increased T2 signal within right retrobulbar optic nerve with reduced diffusion and possible papilledema. Findings probably represent acute anterior ischemic optic neuropathy. A longer segment of signal abnormality would be more typical for infectious or inflammatory causes considered less likely. No definite signal abnormality of the left optic nerve. 2. No acute abnormality of brain parenchyma. 3. Moderate to severe chronic microvascular ischemic changes and moderate parenchymal volume loss of the brain. 4. Age indeterminate right intracranial vertebral artery occlusion. Otherwise negative MRA of the head. These results will be called to the ordering clinician or representative by the Radiologist Assistant, and communication documented in the PACS or zVision Dashboard. Electronically Signed   By: Kristine Garbe M.D.   On: 10/26/2017 05:35   Mr Brain Wo Contrast  Result Date: 10/26/2017 CLINICAL DATA:  84 y/o M; 10/22/2017 right temporal headache and loss of vision. EXAM: MRI HEAD WITHOUT CONTRAST MRI ORBITS WITHOUT CONTRAST MRA WITHOUT CONTRAST TECHNIQUE: Multiplanar, multiecho pulse sequences of the brain and surrounding structures were obtained without intravenous contrast. Thin T2 coronal and axial sequences were obtained through the orbits. Angiographic images of the head were obtained without intravenous contrast. Patient declined to continue the examination. COMPARISON:  None. FINDINGS: MRI HEAD FINDINGS Brain: No acute infarction, hemorrhage, hydrocephalus, extra-axial collection or mass lesion. Patchy confluentnonspecific foci of T2 FLAIR hyperintense signal abnormality in subcortical and periventricular white matter are compatible withmoderate to severechronic microvascular ischemic changes for age. Moderatebrain parenchymal volume loss. Vascular: Loss of normal right vertebral artery flow void. Skull and upper cervical spine: Normal marrow signal. 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  MRI ORBITS FINDINGS Orbits: Limited to thin axial and coronal T2 weighted sequences. Bilateral intra-ocular lens replacement. Question punctate increased T2 signal within the retrobulbar optic nerve, possible papilledema. Punctate reduced diffusion similar location on MRI of the brain (series 3, image 17 in series 12 image 10). No abnormal signal within the orbital, canalicular, or cisternal segments of the optic nerves identified. No abnormal signal of optic chiasm or radiations identified. Visualized sinuses: No significant abnormal signal. Soft tissues: Negative. Limited intracranial: As above. MRA HEAD FINDINGS Internal carotid arteries:  Patent. Anterior cerebral arteries:  Patent. Middle cerebral arteries: Patent. Anterior communicating artery: Patent. Posterior communicating arteries: Small right. Probable diminutive left. Posterior cerebral arteries:  Patent. Basilar artery:  Patent. Vertebral arteries: Right V4 segment occlusion, age indeterminate. Patent dominant left vertebral artery. No evidence of high-grade stenosis, large vessel occlusion, or aneurysm unless noted above. IMPRESSION: 1. Punctate increased T2 signal within right retrobulbar optic nerve with reduced diffusion and possible papilledema. Findings probably represent acute anterior ischemic optic neuropathy. A longer segment of signal abnormality would be more typical for infectious or inflammatory causes considered less likely. No definite signal abnormality of the left optic nerve. 2. No acute abnormality of brain parenchyma. 3. Moderate to severe chronic microvascular ischemic changes and moderate parenchymal volume loss of the brain. 4. Age indeterminate right intracranial  vertebral artery occlusion. Otherwise negative MRA of the head. These results will be called to the ordering clinician or representative by the Radiologist Assistant, and communication documented in the PACS or zVision Dashboard. Electronically Signed   By: Kristine Garbe M.D.   On: 10/26/2017 05:35   Mr Darnelle Catalan RJ Contrast  Result Date: 10/26/2017 CLINICAL DATA:  84 y/o M; 10/22/2017 right temporal headache and loss of vision. EXAM: MRI HEAD WITHOUT CONTRAST MRI ORBITS WITHOUT CONTRAST MRA WITHOUT CONTRAST TECHNIQUE: Multiplanar, multiecho pulse sequences of the brain and surrounding structures were obtained without intravenous contrast. Thin T2 coronal and axial sequences were obtained through the orbits. Angiographic images of the head were obtained without intravenous contrast. Patient declined to continue the examination. COMPARISON:  None. FINDINGS: MRI HEAD FINDINGS Brain: No acute infarction, hemorrhage, hydrocephalus, extra-axial collection or mass lesion. Patchy confluentnonspecific foci of T2 FLAIR hyperintense signal abnormality in subcortical and periventricular white matter are compatible withmoderate to severechronic microvascular ischemic changes for age. Moderatebrain parenchymal volume loss. Vascular: Loss of normal right vertebral artery flow void. Skull and upper cervical spine: Normal marrow signal. 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 MRI ORBITS FINDINGS Orbits: Limited to thin axial and coronal T2 weighted sequences. Bilateral intra-ocular lens replacement. Question punctate increased T2 signal within the retrobulbar optic nerve, possible papilledema. Punctate reduced diffusion similar location on MRI of the brain (series 3, image 17 in series 12 image 10). No abnormal signal within the orbital, canalicular, or cisternal segments of the optic nerves identified. No abnormal signal of optic chiasm or radiations identified. Visualized sinuses: No significant abnormal signal. Soft tissues: Negative. Limited intracranial: As above. MRA HEAD FINDINGS Internal carotid arteries:  Patent. Anterior cerebral arteries:  Patent. Middle cerebral arteries: Patent. Anterior communicating artery: Patent. Posterior communicating arteries: Small right. Probable  diminutive left. Posterior cerebral arteries:  Patent. Basilar artery:  Patent. Vertebral arteries: Right V4 segment occlusion, age indeterminate. Patent dominant left vertebral artery. No evidence of high-grade stenosis, large vessel occlusion, or aneurysm unless noted above. IMPRESSION: 1. Punctate increased T2 signal within right retrobulbar optic nerve with reduced diffusion and possible papilledema. Findings probably represent acute anterior ischemic optic neuropathy. A longer segment of signal abnormality would be more typical for infectious or inflammatory causes considered less likely. No definite signal abnormality of the left optic nerve. 2. No acute abnormality of brain parenchyma. 3. Moderate to severe chronic microvascular ischemic changes and moderate parenchymal volume loss of the brain. 4. Age indeterminate right intracranial vertebral artery occlusion. Otherwise negative MRA of the head. These results will be called to the ordering clinician or representative by the Radiologist Assistant, and communication documented in the PACS or zVision Dashboard. Electronically Signed   By: Kristine Garbe M.D.   On: 10/26/2017 05:35    Medications:   . allopurinol  100 mg Oral Daily  . aspirin  300 mg Rectal Daily   Or  . aspirin  325 mg Oral Daily  . heparin  5,000 Units Subcutaneous Q8H  . mirtazapine  15 mg Oral QHS  . pantoprazole  40 mg Oral BID  . pravastatin  40 mg Oral QPM  . sodium chloride flush  3 mL Intravenous Q12H   Continuous Infusions: .  ceFAZolin (ANCEF) IV    . methylPREDNISolone (SOLU-MEDROL) injection Stopped (10/27/17 0355)      LOS: 1 day     Jacquelynn Cree  [facility] Pager [phone]. If unable to reach me by pager, please call my cell phone at [phone].  *Please refer to [email or web address], password [email or web address] to get updated schedule on who will round on this patient, as hospitalists switch teams weekly. If 7PM-7AM, please contact night-coverage  at [email or web address], password [email or web address] for any overnight needs.  10/27/2017, 8:08 AM

## 2017-10-27 NOTE — Progress Notes (Signed)
Return call from [facility], he said need to paged Dr. Rockne Menghini. Dr. Rockne Menghini notified via [place].

## 2017-10-27 NOTE — Interval H&P Note (Signed)
History and Physical Interval Note:  10/27/2017 9:53 AM  [name]  has presented today for surgery, with the diagnosis of HEADACHES/ VISION LOSS R/O TEMPORAL ARTERITIS  The various methods of treatment have been discussed with the patient and family. After consideration of risks, benefits and other options for treatment, the patient has consented to  Procedure(s): BIOPSY TEMPORAL ARTERY RIGHT (Right) as a surgical intervention .  The patient's history has been reviewed, patient examined, no change in status, stable for surgery.  I have reviewed the patient's chart and labs.  Questions were answered to the patient's satisfaction.     [name]

## 2017-10-27 NOTE — Progress Notes (Signed)
OT Cancellation Note  Patient Details Name: [name] MRN: [id number] DOB: March 29, 1925   Cancelled Treatment:    Reason Eval/Treat Not Completed: Patient at procedure or test/ unavailable.  Pt undergoing biopsy.  [place], OTR/L [phone]   Lucille Passy M 10/27/2017, 10:03 AM

## 2017-10-27 NOTE — Anesthesia Postprocedure Evaluation (Signed)
Anesthesia Post Note  Patient: [name]  Procedure(s) Performed: BILATERAL TEMPORAL ARTERY BIOPSY (Bilateral Face)     Patient location during evaluation: PACU Anesthesia Type: General Level of consciousness: awake and alert Pain management: pain level controlled Vital Signs Assessment: post-procedure vital signs reviewed and stable Respiratory status: spontaneous breathing, nonlabored ventilation, respiratory function stable and patient connected to nasal cannula oxygen Cardiovascular status: blood pressure returned to baseline and stable Postop Assessment: no apparent nausea or vomiting Anesthetic complications: no    Last Vitals:  Vitals:   10/27/17 1340 10/27/17 1354  BP:  (!) 137/52  Pulse: 84 88  Resp: 16   Temp:    SpO2: 94% 93%    Last Pain:  Vitals:   10/27/17 1330  TempSrc:   PainSc: [place] Lacharles Al[place]uler

## 2017-10-27 NOTE — Anesthesia Procedure Notes (Signed)
Procedure Name: LMA Insertion Date/Time: 10/27/2017 10:17 AM Performed by: Neldon Newport, CRNA Pre-anesthesia Checklist: Timeout performed, Patient being monitored, Emergency Drugs available, Suction available and Patient identified Patient Re-evaluated:Patient Re-evaluated prior to induction Oxygen Delivery Method: Circle system utilized Preoxygenation: Pre-oxygenation with 100% oxygen Induction Type: IV induction Ventilation: Mask ventilation without difficulty LMA: LMA inserted LMA Size: 4.0 Number of attempts: 1 Placement Confirmation: breath sounds checked- equal and bilateral and positive ETCO2 Tube secured with: Tape Dental Injury: Teeth and Oropharynx as per pre-operative assessment

## 2017-10-27 NOTE — Progress Notes (Signed)
Received patient from PACU, resting but arousable

## 2017-10-27 NOTE — Progress Notes (Signed)
Was trying to bring patient back to [facility] but CN per their Director is not able to take patient back due to procedure done. Dr. Trula Slade notified and he said that he signed off patient already. Hospitalist paged.

## 2017-10-28 ENCOUNTER — Encounter (HOSPITAL_COMMUNITY): Payer: Self-pay | Admitting: General Practice

## 2017-10-28 ENCOUNTER — Inpatient Hospital Stay (HOSPITAL_COMMUNITY): Payer: Medicare HMO

## 2017-10-28 DIAGNOSIS — N183 Chronic kidney disease, stage 3 (moderate): Secondary | ICD-10-CM

## 2017-10-28 DIAGNOSIS — I10 Essential (primary) hypertension: Secondary | ICD-10-CM

## 2017-10-28 DIAGNOSIS — H547 Unspecified visual loss: Secondary | ICD-10-CM

## 2017-10-28 DIAGNOSIS — H543 Unqualified visual loss, both eyes: Secondary | ICD-10-CM

## 2017-10-28 LAB — GLUCOSE, CAPILLARY: GLUCOSE-CAPILLARY: 145 mg/dL — AB (ref 65–99)

## 2017-10-28 MED ORDER — PREDNISONE 20 MG PO TABS: 60.0000 mg | ORAL_TABLET | Freq: Every day | ORAL | 1 refills | Status: DC

## 2017-10-28 MED ORDER — PREDNISONE 20 MG PO TABS: 60.0000 mg | ORAL_TABLET | Freq: Every day | ORAL | Status: DC

## 2017-10-28 MED ORDER — OMEPRAZOLE 40 MG PO CPDR: 40.0000 mg | DELAYED_RELEASE_CAPSULE | Freq: Every day | ORAL | 2 refills | Status: DC

## 2017-10-28 NOTE — Progress Notes (Addendum)
  Progress Note    10/28/2017 9:53 AM 1 Day Post-Op  Subjective:  Says he feels ok but no improvement in his vision  Afebrile   Vitals:   10/28/17 0430 10/28/17 0835  BP: (!) 120/49 134/89  Pulse: 76 82  Resp: 20 20  Temp: 97.9 F (36.6 C) 97.6 F (36.4 C)  SpO2: 98% 95%    Physical Exam: General:  No distress Lungs:  Non labored Incisions:  Bilateral temporal incisions are clean and dry   CBC    Component Value Date/Time   WBC 16.4 (H) 10/27/2017 1205   RBC 3.60 (L) 10/27/2017 1205   HGB 11.2 (L) 10/27/2017 1205   HGB 13.7 02/19/2012 1103   HCT 33.2 (L) 10/27/2017 1205   HCT 39.6 02/19/2012 1103   PLT 369 10/27/2017 1205   PLT 266 02/19/2012 1103   MCV 92.2 10/27/2017 1205   MCV 92.5 02/19/2012 1103   MCH 31.1 10/27/2017 1205   MCHC 33.7 10/27/2017 1205   RDW 14.5 10/27/2017 1205   RDW 13.8 02/19/2012 1103   LYMPHSABS 3.4 10/26/2017 0217   LYMPHSABS 3.6 (H) 02/19/2012 1103   MONOABS 1.2 (H) 10/26/2017 0217   MONOABS 1.1 (H) 02/19/2012 1103   EOSABS 0.3 10/26/2017 0217   EOSABS 0.3 02/19/2012 1103   BASOSABS 0.0 10/26/2017 0217   BASOSABS 0.0 02/19/2012 1103    BMET    Component Value Date/Time   NA 136 10/27/2017 1205   K 3.8 10/27/2017 1205   CL 102 10/27/2017 1205   CO2 21 (L) 10/27/2017 1205   GLUCOSE 161 (H) 10/27/2017 1205   BUN 30 (H) 10/27/2017 1205   CREATININE 1.78 (H) 10/27/2017 1205   CALCIUM 8.7 (L) 10/27/2017 1205   GFRNONAA 32 (L) 10/27/2017 1205   GFRAA 37 (L) 10/27/2017 1205    INR    Component Value Date/Time   INR 1.21 10/27/2017 1205     Intake/Output Summary (Last 24 hours) at 10/28/2017 0953 Last data filed at 10/28/2017 0700 Gross per 24 hour  Intake 1378.67 ml  Output 202 ml  Net 1176.67 ml     Assessment:  84 y.o. male is s/p:  Bilateral temporal artery biopsy  1 Day Post-Op  Plan: -pt's incisions are clean and dry -f/u with neuro/PCP for biopsy results -f/u with [facility] prn    Leontine Locket,  PA-C [facility] [phone] 10/28/2017 9:53 AM

## 2017-10-28 NOTE — Evaluation (Signed)
Physical Therapy Evaluation and Discharge Patient Details Name: [name] MRN: [id number] DOB: [birth year] Today's Date: 10/28/2017   History of Present Illness  This 84 y.o. male admitted 10/25/16 for Rt sided temporal headache followed by vision loss.  MRI showed Rt retrobulbar optic nerve inflammation and possible papilledema consistent with acute anterior ischemic optic neuropathy, but no evidence of acute stroke.   Dx:  Likely temporaal arteritis vs strok in eye.   Pt underwent bil. temporal biopsies on 10/27/17.  PMH includes:  CVA, PVD, CKD stage III, HTN, vertigo, COPD, back pain      Clinical Impression  Pt admitted with above diagnosis. Pt currently with functional limitations due to the deficits listed below (see PT Problem List). PTA, patient living at home with stairs to enter,  with wife ambulating with RW with 24 hour support. Upon eval pt presents with limitations in vision and balance. Min guard level for all OOB mobility. Session focused on stair training and discussing safety consideratoins for safe return home. Pt would benefit from HHPT to improve balance and reduce risk of falling. Pt and family agreeable and feel confident they can safely be back at home this afternoon. Planned for d/c this afternoon, PT to sign off.       Follow Up Recommendations Home health PT;Supervision/Assistance - 24 hour    Equipment Recommendations  None recommended by PT    Recommendations for Other Services       Precautions / Restrictions Precautions Precautions: Fall Precaution Comments: severely HOH and impaired vision bil. eyes Rt worse than Lt       Mobility  Bed Mobility Overal bed mobility: Needs Assistance Bed Mobility: Supine to Sit     Supine to sit: Supervision        Transfers Overall transfer level: Needs assistance Equipment used: Rolling walker (2 wheeled) Transfers: Sit to/from [facility] Sit to Stand: Min guard Stand pivot transfers: Min  guard       General transfer comment: min guard for safety  Ambulation/Gait Ambulation/Gait assistance: Min guard Ambulation Distance (Feet): 50 Feet Assistive device: Rolling walker (2 wheeled) Gait Pattern/deviations: Step-through pattern;Trunk flexed Gait velocity: normal Gait velocity interpretation: at or above normal speed for age/gender General Gait Details: normal speed, kyphotic posture, cues for proximity to RW and education to family on superivsion and guarding  Stairs Stairs: Yes Stairs assistance: Min guard Stair Management: Two rails;Step to pattern Number of Stairs: 12 General stair comments: cues for BUE support, family educated on proper guarding and able to safely demo stairs for PT. no LOB or balance concerns with BUE support and min guard from son.   Wheelchair Mobility    Modified Rankin (Stroke Patients Only)       Balance Overall balance assessment: Needs assistance Sitting-balance support: Feet supported Sitting balance-Leahy Scale: Good     Standing balance support: No upper extremity supported Standing balance-Leahy Scale: Fair                               Pertinent Vitals/Pain Pain Assessment: No/denies pain    Home Living Family/patient expects to be discharged to:: Private residence Living Arrangements: Spouse/significant other;Children Available Help at Discharge: Family;Available 24 hours/day Type of Home: House Home Access: Stairs to enter Entrance Stairs-Rails: Psychiatric nurse of Steps: 8 at the front entrance, 3 at back entrance  Home Layout: One level Home Equipment: [employer] - 2 wheels;Shower seat;Cane - single point  Additional Comments: Pt lives with wife, who is in good health, and daughter, who is a brittle diabetic     Prior Function Level of Independence: Needs assistance   Gait / Transfers Assistance Needed: per son, pt has had a progressive decline over the past year.  He recently has  required use of RW during ambulation   ADL's / Homemaking Assistance Needed: Prior to vision loss, pt was able to dress, feed, and toilet self.  He required assist from wife with grooming, bathing, and IADLs   Comments: since onset of vision loss, wife has been assisting with all ADLs.  Son reports pt is very sedentary, he spends most of his time in bed watching TV or listening to tapes.   He would attend church on Wed pms      Hand Dominance   Dominant Hand: Right    Extremity/Trunk Assessment   Upper Extremity Assessment Upper Extremity Assessment: Overall WFL for tasks assessed    Lower Extremity Assessment Lower Extremity Assessment: Overall WFL for tasks assessed    Cervical / Trunk Assessment Cervical / Trunk Assessment: Kyphotic  Communication   Communication: HOH  Cognition Arousal/Alertness: Awake/alert Behavior During Therapy: WFL for tasks assessed/performed Overall Cognitive Status: Within Functional Limits for tasks assessed                                 General Comments: WFL for tasks assessed.  Son feels pt is at his baseline       General Comments General comments (skin integrity, edema, etc.): son and wife present. discussed fall risk with low vision and risk of bumping into objects with RW and tripping. educated on beneits of close supervision for OOB mobility and guarding    Exercises     Assessment/Plan    PT Assessment All further PT needs can be met in the next venue of care  PT Problem List Decreased strength;Decreased range of motion;Decreased balance;Decreased activity tolerance;Decreased mobility;Decreased coordination;Decreased knowledge of use of DME;Decreased safety awareness       PT Treatment Interventions      PT Goals (Current goals can be found in the Care Plan section)  Acute Rehab PT Goals Patient Stated Goal: to go home  PT Goal Formulation: With patient Time For Goal Achievement: 11/04/17 Potential to Achieve  Goals: Good    Frequency     Barriers to discharge        Co-evaluation               AM-PAC PT "6 Clicks" Daily Activity  Outcome Measure Difficulty turning over in bed (including adjusting bedclothes, sheets and blankets)?: None Difficulty moving from lying on back to sitting on the side of the bed? : None Difficulty sitting down on and standing up from a chair with arms (e.g., wheelchair, bedside commode, etc,.)?: None Help needed moving to and from a bed to chair (including a wheelchair)?: A Little Help needed walking in hospital room?: A Little Help needed climbing 3-5 steps with a railing? : A Little 6 Click Score: 21    End of Session Equipment Utilized During Treatment: Gait belt Activity Tolerance: Patient tolerated treatment well Patient left: in bed;with call bell/phone within reach;with family/visitor present Nurse Communication: Mobility status PT Visit Diagnosis: Unsteadiness on feet (R26.81);Other abnormalities of gait and mobility (R26.89);Other symptoms and signs involving the nervous system (R29.898)    Time: 6734-1937 PT Time Calculation (min) (ACUTE ONLY):  34 min   Charges:   PT Evaluation $PT Eval Low Complexity: 1 Low PT Treatments $Gait Training: 8-22 mins   PT G Codes:        Reinaldo Berber, PT, DPT Acute Rehab Services Pager: [phone]    Reinaldo Berber 10/28/2017, 3:35 PM

## 2017-10-28 NOTE — Progress Notes (Signed)
*  PRELIMINARY RESULTS* Vascular Ultrasound Carotid Duplex (Doppler) has been completed.  Findings suggest 1-39% internal carotid artery stenosis bilaterally. Vertebral arteries are patent with antegrade flow.  10/28/2017 2:41 PM Maudry Mayhew, BS, RVT, RDCS, RDMS

## 2017-10-28 NOTE — Evaluation (Signed)
Occupational Therapy Evaluation Patient Details Name: [name] MRN: [id number] DOB: 1925-08-06 Today's Date: 10/28/2017    History of Present Illness This 84 y.o. male admitted 10/25/16 for Rt sided temporal headache followed by vision loss.  MRI showed Rt retrobulbar optic nerve inflammation and possible papilledema consistent with acute anterior ischemic optic neuropathy, but no evidence of acute stroke.   Dx:  Likely temporaal arteritis vs strok in eye.   Pt underwent bil. temporal biopsies on 10/27/17.  PMH includes:  CVA, PVD, CKD stage III, HTN, vertigo, COPD, back pain    Clinical Impression   Pt admitted with the above.  He demonstrates the below listed deficits.  Overall, he requires mod A for ADLs, and min A for functional mobility.  He lives with his wife and daughter, who can provide needed assist at discharge.   Son reports pt was fairly sedentary PTA, and visual change will not impact him greatly - wife was providing some assist PTA.  Did discuss modifications for home environment and community resources such as services for the blind.  Recommend HHOT and PT.  Pt to discharge home today.   Acute OT will sign off at this time.      Follow Up Recommendations  Home health OT;Supervision/Assistance - 24 hour;Other (comment)(HHPT )    Equipment Recommendations  None recommended by OT    Recommendations for Other Services       Precautions / Restrictions Precautions Precautions: Fall Precaution Comments: severely HOH and impaired vision bil. eyes Rt worse than Lt       Mobility Bed Mobility Overal bed mobility: Needs Assistance Bed Mobility: Supine to Sit     Supine to sit: Supervision        Transfers Overall transfer level: Needs assistance   Transfers: Sit to/from Stand;Stand Pivot Transfers Sit to Stand: Min guard Stand pivot transfers: Min assist       General transfer comment: assist for balance     Balance Overall balance assessment: Needs  assistance Sitting-balance support: Feet supported Sitting balance-Leahy Scale: Good     Standing balance support: No upper extremity supported Standing balance-Leahy Scale: Fair                             ADL either performed or assessed with clinical judgement   ADL Overall ADL's : Needs assistance/impaired Eating/Feeding: Minimal assistance;Sitting   Grooming: Wash/dry hands;Wash/dry face;Oral care;Minimal assistance;Standing   Upper Body Bathing: Minimal assistance;Sitting   Lower Body Bathing: Moderate assistance;Sit to/from stand   Upper Body Dressing : Moderate assistance;Sitting   Lower Body Dressing: Moderate assistance;Sit to/from stand Lower Body Dressing Details (indicate cue type and reason): Pt able to don socks EOB with supervision  Toilet Transfer: Minimal assistance;Ambulation;Comfort height toilet;RW   Toileting- Clothing Manipulation and Hygiene: Moderate assistance;Sit to/from stand       Functional mobility during ADLs: Minimal assistance;Rolling walker General ADL Comments: son feels comfortable with pt's current status and family's ability to assist him at discharge      Vision Baseline Vision/History: Wears glasses Wears Glasses: At all times Patient Visual Report: Blurring of vision;Central vision impairment;Peripheral vision impairment Additional Comments: Pt reports virtually no vision in Lt eye;   he is able to identify objects in room - clock on wall, door, people, etc, but can't make out the details with his Lt eye      Perception Perception Perception Tested?: Yes   Praxis Praxis Praxis tested?:  Within functional limits    Pertinent Vitals/Pain Pain Assessment: No/denies pain     Hand Dominance Right   Extremity/Trunk Assessment Upper Extremity Assessment Upper Extremity Assessment: Overall WFL for tasks assessed   Lower Extremity Assessment Lower Extremity Assessment: Defer to PT evaluation   Cervical / Trunk  Assessment Cervical / Trunk Assessment: Kyphotic   Communication Communication Communication: HOH   Cognition Arousal/Alertness: Awake/alert Behavior During Therapy: WFL for tasks assessed/performed Overall Cognitive Status: Within Functional Limits for tasks assessed                                 General Comments: WFL for tasks assessed.  Son feels pt is at his baseline    General Comments  son present.  Discussed keeping home clear of clutter, and keeping environment consistent - he voices understanding.  He is also aware of pt's need for assist at all times.  Discussed community resources such as services for the blind and instructed him to discuss referral with ophthalmologist at follow up appt.     Exercises     Shoulder Instructions      Home Living Family/patient expects to be discharged to:: Private residence Living Arrangements: Spouse/significant other;Children Available Help at Discharge: Family;Available 24 hours/day Type of Home: House Home Access: Stairs to enter [employer] of Steps: 8 at the front entrance, 3 at back entrance  Entrance Stairs-Rails: Right;Left(Rt at back entrance, Lt at front ) Home Layout: One level     Bathroom Shower/Tub: Tub/shower unit;Curtain   Biochemist, clinical: Standard     Home Equipment: Environmental consultant - 2 wheels;Shower seat;Cane - single point   Additional Comments: Pt lives with wife, who is in good health, and daughter, who is a brittle diabetic       Prior Functioning/Environment Level of Independence: Needs assistance  Gait / Transfers Assistance Needed: per son, pt has had a progressive decline over the past year.  He recently has required use of RW during ambulation  ADL's / Homemaking Assistance Needed: Prior to vision loss, pt was able to dress, feed, and toilet self.  He required assist from wife with grooming, bathing, and IADLs  Communication / Swallowing Assistance Needed: HOH  Comments: since onset  of vision loss, wife has been assisting with all ADLs.  Son reports pt is very sedentary, he spends most of his time in bed watching TV or listening to tapes.   He would attend church on Wed pms         [place] Problem List: Decreased strength;Decreased activity tolerance;Impaired balance (sitting and/or standing);Impaired vision/perception;Decreased knowledge of use of DME or AE      OT Treatment/Interventions:      OT Goals(Current goals can be found in the care plan section) Acute Rehab OT Goals Patient Stated Goal: to go home  OT Goal Formulation: All assessment and education complete, DC therapy  OT Frequency:     Barriers to D/C:            Co-evaluation              AM-PAC PT "6 Clicks" Daily Activity     Outcome Measure Help from another person eating meals?: A Little Help from another person taking care of personal grooming?: A Little Help from another person toileting, which includes using toliet, bedpan, or urinal?: A Lot Help from another person bathing (including washing, rinsing, drying)?: A Lot Help from another person to  put on and taking off regular upper body clothing?: A Lot Help from another person to put on and taking off regular lower body clothing?: A Lot 6 Click Score: 14   End of Session Equipment Utilized During Treatment: Gait belt;Rolling walker Nurse Communication: Mobility status  Activity Tolerance: Patient tolerated treatment well Patient left: in bed;with call bell/phone within reach;with bed alarm set;with family/visitor present  OT Visit Diagnosis: Unsteadiness on feet (R26.81);Low vision, both eyes (H54.2)                Time: 1100-1129 OT Time Calculation (min): 29 min Charges:  OT General Charges $OT Visit: 1 Visit OT Evaluation $OT Eval Moderate Complexity: 1 Mod OT Treatments $Self Care/Home Management : 8-22 mins G-Codes:     [facility], OTR/L [phone]   Lucille Passy M 10/28/2017, 11:49 AM

## 2017-10-28 NOTE — Discharge Summary (Addendum)
Physician Discharge Summary  [name] MRN: [id number] DOB/AGE: 82/22/1926 82 y.o.  PCP: [name], MD   Admit date: 10/25/2017 Discharge date: 10/28/2017  Discharge Diagnoses:    Principal Problem:   Vision loss, bilateral Active Problems:   Essential hypertension   CKD (chronic kidney disease), stage III ([place])   History of CVA (cerebrovascular accident)    Follow-up recommendations Follow-up with PCP in 3-5 days , including all  additional recommended appointments as below Follow-up CBC, CMP in 3-5 days Follow up with Dr Shonna Chock for Outpatient 30 day loop monitor Dr. Lorraine Lax , neurologist, recommends 60 mg prednisone daily and follow up with Opthalmology1-2 weeks F/U with Neurology as outpatient HHPT/OT             Allergies as of 10/28/2017      Reactions   Sulfonamide Derivatives    REACTION: rash      Medication List    STOP taking these medications   indomethacin 50 MG capsule Commonly known as:  INDOCIN     TAKE these medications   allopurinol 100 MG tablet Commonly known as:  ZYLOPRIM Take 1 tablet (100 mg total) by mouth daily.   amLODipine 5 MG tablet Commonly known as:  NORVASC Take 1 tablet (5 mg total) by mouth daily.   aspirin EC 81 MG tablet Take 1 tablet (81 mg total) by mouth daily.   cetirizine 10 MG tablet Commonly known as:  ZYRTEC Take 10 mg by mouth daily as needed for allergies.   hydrochlorothiazide 25 MG tablet Commonly known as:  HYDRODIURIL Take 1 tablet (25 mg total) by mouth daily.   mirtazapine 15 MG tablet Commonly known as:  REMERON Take 1 tablet (15 mg total) by mouth at bedtime.   omeprazole 40 MG capsule Commonly known as:  PRILOSEC Take 1 capsule (40 mg total) by mouth daily. What changed:    medication strength  how much to take   polyethylene glycol packet Commonly known as:  MIRALAX / GLYCOLAX Take 17 g by mouth daily as needed for mild constipation.   pravastatin 40 MG  tablet Commonly known as:  PRAVACHOL Take 1 tablet (40 mg total) by mouth every evening.   predniSONE 20 MG tablet Commonly known as:  DELTASONE Take 3 tablets (60 mg total) by mouth daily with breakfast. Start taking on:  10/29/2017        Discharge Condition:  None   Discharge Instructions Get Medicines reviewed and adjusted: Please take all your medications with you for your next visit with your Primary MD  Please request your Primary MD to go over all hospital tests and procedure/radiological results at the follow up, please ask your Primary MD to get all Hospital records sent to his/her office.  If you experience worsening of your admission symptoms, develop shortness of breath, life threatening emergency, suicidal or homicidal thoughts you must seek medical attention immediately by calling 911 or calling your MD immediately if symptoms less severe.  You must read complete instructions/literature along with all the possible adverse reactions/side effects for all the Medicines you take and that have been prescribed to you. Take any new Medicines after you have completely understood and accpet all the possible adverse reactions/side effects.   Do not drive when taking Pain medications.   Do not take more than prescribed Pain, Sleep and Anxiety Medications  Special Instructions: If you have smoked or chewed Tobacco in the last 2 yrs please stop smoking, stop any regular Alcohol and  or any Recreational drug use.  Wear Seat belts while driving.  Please note  You were cared for by a hospitalist during your hospital stay. Once you are discharged, your primary care physician will handle any further medical issues. Please note that NO REFILLS for any discharge medications will be authorized once you are discharged, as it is imperative that you return to your primary care physician (or establish a relationship with a primary care physician if you do not have one) for your aftercare  needs so that they can reassess your need for medications and monitor your lab values.  Discharge Instructions    Ambulatory referral to Neurology   Complete by:  As directed    Follow up with neurology recommended by Inpatient neurologist Dr Lorraine Lax , within a week       Allergies  Allergen Reactions  . Sulfonamide Derivatives     REACTION: rash      Disposition:    Consults:  Vascular  neurology    Significant Diagnostic Studies:  Dg Chest 2 View  Result Date: 10/19/2017 CLINICAL DATA:  84 year old male with recent weight loss. Shortness breath. Right-sided chest and back pain intermittent over the past 2 months. Smoker. Hypertension. Renal cell carcinoma post nephrectomy 1997. Initial encounter. EXAM: CHEST  2 VIEW COMPARISON:  12/30/2016 and 11/14/2012 chest x-ray. 03/20/2008 chest CT. FINDINGS: Chronic lung changes greater on the right stable. No infiltrate, congestive heart failure or pneumothorax. No plain film evidence of pulmonary malignancy. Stable mild central pulmonary vascular prominence greater on the right. Heart size within normal limits. Calcified slightly tortuous aorta. Scoliosis lower thoracic spine convex right. No acute osseous abnormality. IMPRESSION: Stable chronic lung changes without acute cardiopulmonary abnormality noted. Aortic Atherosclerosis (ICD10-I70.0). Electronically Signed   By: Genia Del M.D.   On: 10/19/2017 08:33   Ct Head Wo Contrast  Result Date: 10/25/2017 CLINICAL DATA:  84 year old male with visual loss in both eyes for the past 4 days. Possible temporal arteritis. Initial encounter. EXAM: CT HEAD WITHOUT CONTRAST TECHNIQUE: Contiguous axial images were obtained from the base of the skull through the vertex without intravenous contrast. COMPARISON:  None. FINDINGS: Brain: No intracranial hemorrhage or CT evidence of large acute infarct. Prominent chronic microvascular changes. Global atrophy. No intracranial mass lesion noted on this  unenhanced exam. Vascular: Vascular calcifications. Skull: Negative. Sinuses/Orbits: No primary orbital abnormality noted. Visualized sinuses clear. IMPRESSION: No acute intracranial abnormality noted. Specifically no evidence of intracranial hemorrhage or CT findings to suggest large acute infarct. Prominent chronic microvascular changes. Atrophy. Electronically Signed   By: Genia Del M.D.   On: 10/25/2017 17:38   Mr Angiogram Head Wo Contrast  Result Date: 10/26/2017 CLINICAL DATA:  84 y/o M; 10/22/2017 right temporal headache and loss of vision. EXAM: MRI HEAD WITHOUT CONTRAST MRI ORBITS WITHOUT CONTRAST MRA WITHOUT CONTRAST TECHNIQUE: Multiplanar, multiecho pulse sequences of the brain and surrounding structures were obtained without intravenous contrast. Thin T2 coronal and axial sequences were obtained through the orbits. Angiographic images of the head were obtained without intravenous contrast. Patient declined to continue the examination. COMPARISON:  None. FINDINGS: MRI HEAD FINDINGS Brain: No acute infarction, hemorrhage, hydrocephalus, extra-axial collection or mass lesion. Patchy confluentnonspecific foci of T2 FLAIR hyperintense signal abnormality in subcortical and periventricular white matter are compatible withmoderate to severechronic microvascular ischemic changes for age. Moderatebrain parenchymal volume loss. Vascular: Loss of normal right vertebral artery flow void. Skull and upper cervical spine: Normal marrow signal. 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 MRI ORBITS  FINDINGS Orbits: Limited to thin axial and coronal T2 weighted sequences. Bilateral intra-ocular lens replacement. Question punctate increased T2 signal within the retrobulbar optic nerve, possible papilledema. Punctate reduced diffusion similar location on MRI of the brain (series 3, image 17 in series 12 image 10). No abnormal signal within the orbital, canalicular, or cisternal segments of the optic nerves identified. No  abnormal signal of optic chiasm or radiations identified. Visualized sinuses: No significant abnormal signal. Soft tissues: Negative. Limited intracranial: As above. MRA HEAD FINDINGS Internal carotid arteries:  Patent. Anterior cerebral arteries:  Patent. Middle cerebral arteries: Patent. Anterior communicating artery: Patent. Posterior communicating arteries: Small right. Probable diminutive left. Posterior cerebral arteries:  Patent. Basilar artery:  Patent. Vertebral arteries: Right V4 segment occlusion, age indeterminate. Patent dominant left vertebral artery. No evidence of high-grade stenosis, large vessel occlusion, or aneurysm unless noted above. IMPRESSION: 1. Punctate increased T2 signal within right retrobulbar optic nerve with reduced diffusion and possible papilledema. Findings probably represent acute anterior ischemic optic neuropathy. A longer segment of signal abnormality would be more typical for infectious or inflammatory causes considered less likely. No definite signal abnormality of the left optic nerve. 2. No acute abnormality of brain parenchyma. 3. Moderate to severe chronic microvascular ischemic changes and moderate parenchymal volume loss of the brain. 4. Age indeterminate right intracranial vertebral artery occlusion. Otherwise negative MRA of the head. These results will be called to the ordering clinician or representative by the Radiologist Assistant, and communication documented in the PACS or zVision Dashboard. Electronically Signed   By: Kristine Garbe M.D.   On: 10/26/2017 05:35   Mr Brain Wo Contrast  Result Date: 10/26/2017 CLINICAL DATA:  84 y/o M; 10/22/2017 right temporal headache and loss of vision. EXAM: MRI HEAD WITHOUT CONTRAST MRI ORBITS WITHOUT CONTRAST MRA WITHOUT CONTRAST TECHNIQUE: Multiplanar, multiecho pulse sequences of the brain and surrounding structures were obtained without intravenous contrast. Thin T2 coronal and axial sequences were obtained  through the orbits. Angiographic images of the head were obtained without intravenous contrast. Patient declined to continue the examination. COMPARISON:  None. FINDINGS: MRI HEAD FINDINGS Brain: No acute infarction, hemorrhage, hydrocephalus, extra-axial collection or mass lesion. Patchy confluentnonspecific foci of T2 FLAIR hyperintense signal abnormality in subcortical and periventricular white matter are compatible withmoderate to severechronic microvascular ischemic changes for age. Moderatebrain parenchymal volume loss. Vascular: Loss of normal right vertebral artery flow void. Skull and upper cervical spine: Normal marrow signal. 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 MRI ORBITS FINDINGS Orbits: Limited to thin axial and coronal T2 weighted sequences. Bilateral intra-ocular lens replacement. Question punctate increased T2 signal within the retrobulbar optic nerve, possible papilledema. Punctate reduced diffusion similar location on MRI of the brain (series 3, image 17 in series 12 image 10). No abnormal signal within the orbital, canalicular, or cisternal segments of the optic nerves identified. No abnormal signal of optic chiasm or radiations identified. Visualized sinuses: No significant abnormal signal. Soft tissues: Negative. Limited intracranial: As above. MRA HEAD FINDINGS Internal carotid arteries:  Patent. Anterior cerebral arteries:  Patent. Middle cerebral arteries: Patent. Anterior communicating artery: Patent. Posterior communicating arteries: Small right. Probable diminutive left. Posterior cerebral arteries:  Patent. Basilar artery:  Patent. Vertebral arteries: Right V4 segment occlusion, age indeterminate. Patent dominant left vertebral artery. No evidence of high-grade stenosis, large vessel occlusion, or aneurysm unless noted above. IMPRESSION: 1. Punctate increased T2 signal within right retrobulbar optic nerve with reduced diffusion and possible papilledema. Findings probably represent acute  anterior ischemic optic neuropathy. A  longer segment of signal abnormality would be more typical for infectious or inflammatory causes considered less likely. No definite signal abnormality of the left optic nerve. 2. No acute abnormality of brain parenchyma. 3. Moderate to severe chronic microvascular ischemic changes and moderate parenchymal volume loss of the brain. 4. Age indeterminate right intracranial vertebral artery occlusion. Otherwise negative MRA of the head. These results will be called to the ordering clinician or representative by the Radiologist Assistant, and communication documented in the PACS or zVision Dashboard. Electronically Signed   By: Kristine Garbe M.D.   On: 10/26/2017 05:35   Mr Darnelle Catalan YP Contrast  Result Date: 10/26/2017 CLINICAL DATA:  84 y/o M; 10/22/2017 right temporal headache and loss of vision. EXAM: MRI HEAD WITHOUT CONTRAST MRI ORBITS WITHOUT CONTRAST MRA WITHOUT CONTRAST TECHNIQUE: Multiplanar, multiecho pulse sequences of the brain and surrounding structures were obtained without intravenous contrast. Thin T2 coronal and axial sequences were obtained through the orbits. Angiographic images of the head were obtained without intravenous contrast. Patient declined to continue the examination. COMPARISON:  None. FINDINGS: MRI HEAD FINDINGS Brain: No acute infarction, hemorrhage, hydrocephalus, extra-axial collection or mass lesion. Patchy confluentnonspecific foci of T2 FLAIR hyperintense signal abnormality in subcortical and periventricular white matter are compatible withmoderate to severechronic microvascular ischemic changes for age. Moderatebrain parenchymal volume loss. Vascular: Loss of normal right vertebral artery flow void. Skull and upper cervical spine: Normal marrow signal. 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 MRI ORBITS FINDINGS Orbits: Limited to thin axial and coronal T2 weighted sequences. Bilateral intra-ocular lens replacement. Question punctate  increased T2 signal within the retrobulbar optic nerve, possible papilledema. Punctate reduced diffusion similar location on MRI of the brain (series 3, image 17 in series 12 image 10). No abnormal signal within the orbital, canalicular, or cisternal segments of the optic nerves identified. No abnormal signal of optic chiasm or radiations identified. Visualized sinuses: No significant abnormal signal. Soft tissues: Negative. Limited intracranial: As above. MRA HEAD FINDINGS Internal carotid arteries:  Patent. Anterior cerebral arteries:  Patent. Middle cerebral arteries: Patent. Anterior communicating artery: Patent. Posterior communicating arteries: Small right. Probable diminutive left. Posterior cerebral arteries:  Patent. Basilar artery:  Patent. Vertebral arteries: Right V4 segment occlusion, age indeterminate. Patent dominant left vertebral artery. No evidence of high-grade stenosis, large vessel occlusion, or aneurysm unless noted above. IMPRESSION: 1. Punctate increased T2 signal within right retrobulbar optic nerve with reduced diffusion and possible papilledema. Findings probably represent acute anterior ischemic optic neuropathy. A longer segment of signal abnormality would be more typical for infectious or inflammatory causes considered less likely. No definite signal abnormality of the left optic nerve. 2. No acute abnormality of brain parenchyma. 3. Moderate to severe chronic microvascular ischemic changes and moderate parenchymal volume loss of the brain. 4. Age indeterminate right intracranial vertebral artery occlusion. Otherwise negative MRA of the head. These results will be called to the ordering clinician or representative by the Radiologist Assistant, and communication documented in the PACS or zVision Dashboard. Electronically Signed   By: Kristine Garbe M.D.   On: 10/26/2017 05:35      Filed Weights   10/26/17 0203  Weight: 67.6 kg (149 lb)     Microbiology: Recent  Results (from the past 240 hour(s))  MRSA PCR Screening     Status: None   Collection Time: 10/27/17  3:12 AM  Result Value Ref Range Status   MRSA by PCR NEGATIVE NEGATIVE Final    Comment:        The  GeneXpert MRSA Assay (FDA approved for NASAL specimens only), is one component of a comprehensive MRSA colonization surveillance program. It is not intended to diagnose MRSA infection nor to guide or monitor treatment for MRSA infections. Performed at [facility], [place] [address], [place], [place] [address]        Blood Culture No results found for: SDES, [place], CULT, REPTSTATUS    Labs: Results for orders placed or performed during the hospital encounter of 10/25/17 (from the past 48 hour(s))  MRSA PCR Screening     Status: None   Collection Time: 10/27/17  3:12 AM  Result Value Ref Range   MRSA by PCR NEGATIVE NEGATIVE    Comment:        The GeneXpert MRSA Assay (FDA approved for NASAL specimens only), is one component of a comprehensive MRSA colonization surveillance program. It is not intended to diagnose MRSA infection nor to guide or monitor treatment for MRSA infections. Performed at [facility], [place] [address] [place], [place] [address]   Protime-INR     Status: None   Collection Time: 10/27/17 12:05 PM  Result Value Ref Range   Prothrombin Time 15.2 11.4 - 15.2 seconds   INR 1.21     Comment: Performed at [facility] [address], [place], [place] [address]  Basic metabolic panel     Status: Abnormal   Collection Time: 10/27/17 12:05 PM  Result Value Ref Range   Sodium 136 135 - 145 mmol/L   Potassium 3.8 3.5 - 5.1 mmol/L   Chloride 102 101 - 111 mmol/L   CO2 21 (L) 22 - 32 mmol/L   Glucose, Bld 161 (H) 65 - 99 mg/dL   BUN 30 (H) 6 - 20 mg/dL   Creatinine, Ser 1.78 (H) 0.61 - 1.24 mg/dL   Calcium 8.7 (L) 8.9 - 10.3 mg/dL   GFR calc non Af Amer 32 (L) >60 mL/min   GFR calc Af Amer 37 (L) >60 mL/min    Comment:  (NOTE) The eGFR has been calculated using the CKD EPI equation. This calculation has not been validated in all clinical situations. eGFR's persistently <60 mL/min signify possible Chronic Kidney Disease.    Anion gap 13 5 - 15    Comment: Performed at [facility] [address] [place],  [address]  CBC     Status: Abnormal   Collection Time: 10/27/17 12:05 PM  Result Value Ref Range   WBC 16.4 (H) 4.0 - 10.5 K/uL   RBC 3.60 (L) 4.22 - 5.81 MIL/uL   Hemoglobin 11.2 (L) 13.0 - 17.0 g/dL   HCT 33.2 (L) 39.0 - 52.0 %   MCV 92.2 78.0 - 100.0 fL   MCH 31.1 26.0 - 34.0 pg   MCHC 33.7 30.0 - 36.0 g/dL   RDW 14.5 11.5 - 15.5 %   Platelets 369 150 - 400 K/uL    Comment: Performed at [facility], [place] [address], [place], [place] [address]  Glucose, capillary     Status: Abnormal   Collection Time: 10/28/17  6:29 AM  Result Value Ref Range   Glucose-Capillary 145 (H) 65 - 99 mg/dL   Comment 1 Notify RN    Comment 2 Document in Chart      Lipid Panel     Component Value Date/Time   CHOL 101 10/26/2017 0217   TRIG 103 10/26/2017 0217   HDL 37 (L) 10/26/2017 0217   CHOLHDL 2.7  10/26/2017 0217   VLDL 21 10/26/2017 0217   LDLCALC 43 10/26/2017 0217     Lab Results  Component Value Date   HGBA1C 5.5 10/26/2017     Lab Results  Component Value Date   LDLCALC 43 10/26/2017   CREATININE 1.78 (H) 10/27/2017     HPI :  NITHIN DEMEO is an 84 y.o. male with a PMH of hypertension, stage III CKD and a history of stroke who was admitted 10/25/16 for evaluation of right sided temporal headache followed by vision loss.  In the ED, CT of the head was negative for acute intracranial abnormalities.  ESR was 70.  Given 1 g of IV Solu-Medrol for empiric treatment of temporal arteritis.    HOSPITAL COURSE:   Vision loss, bilateral/ischemic optic neuropathy Evaluated by ophthalmology prior to admission with suspicion of CVA versus arteritis.  ESR 70, concerning for  temporal arteritis. Subsequent MRI showed a right retrobulbar optic nerve inflammation and possible papilledema consistent with acute anterior ischemic optic neuropathy, but no evidence of acute stroke. Right intracranial vertebral artery occlusion also noted. Dr. Lorraine Lax recommends high-dose steroids for 3 days, followed by a prolonged taper. For a temporal artery biopsy today.  PT/OT consultations requested. No improvement of symptoms despite high dose steroids. Carotid US SHOWED..................... Echo-mildly dilated LA, EF 60-65  percent,  Continue ASA, statin Outpatient 30 day loop monitor, notify cardiology office D/C with60 mg prednisone daily Opthalmology1-2 weeks F/U with Neurology as outpatient     Essential hypertension No evidence of CVA on imaging. On  Norvasc./HCTZ    CKD (chronic kidney disease), stage III-IV ([place]). Baseline cr around 2.0  Creatinine consistent with usual baseline values.   History of CVA (cerebrovascular accident) No acute CVA on imaging.  Continue risk factor modification with aspirin and statins.  Gout - continue allopurinol, stable  Body mass index is 24.79 kg/m   Discharge Exam:   Blood pressure 134/89, pulse 82, temperature 97.6 F (36.4 C), temperature source Axillary, resp. rate 20, height '5\' 5"'$  (1.651 m), weight 67.6 kg (149 lb), SpO2 95 %. Cardiovascular: Regular rate, and rhythm with a grade 2 systolic ejection murmur. Lungs: Clear to auscultation bilaterally. Abdomen: Soft, nontender, nondistended with normal active bowel sounds. Neurological: No change in visual defects. Skin: No rashes. Extremities: No clubbing or cyanosis. No edema. Pedal pulses 2+.       Follow-up Information    [name], MD. Call.   Specialties:  Internal Medicine, Radiology Why:  Hospital follow-up in 3-5 days Contact information: [place] [address] [phone]        [name], MD Follow up.    Specialty:  Cardiology Why:  follow up for 30 day monitor  Contact information: [address] [place] [address] [phone]        Hurman Horn, MD. Call.   Specialty:  Ophthalmology Why:  To establish appointment, Hospital follow-up in 3-5 days Contact information: [place] [address] [phone]        Greig Castilla, MD Follow up.   Contact information: [address] [place] [address] [phone]        [facility] Follow up.   Specialty:  Vascular Surgery Why:  As needed Contact information: [place] [phone]          Signed: Reyne Dumas 10/28/2017, 11:58 AM  Time spent >1 hour  

## 2017-10-28 NOTE — Progress Notes (Addendum)
No improvement of symptoms despite high dose steroids. Carotid US ordered, pending Echo-mildly dilated LA, EF 60-65 pc,  Continue ASA, statin Outpatient 30 day loop monitor D/C with 60 mg prednisone daily  Opthalmology 1-2 weeks  F/U with Neurology as outpatient

## 2017-10-28 NOTE — Care Management Note (Signed)
Case Management Note  Patient Details  Name: [name] MRN: [id number] Date of Birth: 1924-12-14  Subjective/Objective:                    Action/Plan: Pt discharging home with [name] services. CM met with family and provided choice. [name] was selected. [name] with [name] notified and accepted the referral.  Family to provide transportation home.   Expected Discharge Date:  10/28/17               Expected Discharge Plan:  [place]  In-House Referral:     Discharge planning Services  CM Consult  Post Acute Care Choice:  Home Health Choice offered to:  Patient, Spouse, Adult Children  DME Arranged:    DME Agency:     HH Arranged:  PT, OT, Nurse's Aide [place] Agency:  [place]  Status of Service:  Completed, signed off  If discussed at [place] of Stay Meetings, dates discussed:    Additional Comments:  [name], RN 10/28/2017, 3:49 PM

## 2017-10-29 ENCOUNTER — Telehealth: Payer: Self-pay | Admitting: *Deleted

## 2017-10-29 NOTE — Telephone Encounter (Signed)
Transition Care Management Follow-up Telephone Call   Date discharged? 10/28/17   How have you been since you were released from the hospital? Spoke w/wife she states he is doing OK   Do you understand why you were in the hospital? YES   Do you understand the discharge instructions? YES   Where were you discharged to? Home   Items Reviewed:  Medications reviewed: YES  Allergies reviewed: YES  Dietary changes reviewed: YES  Referrals reviewed: wife states still have to make eye dr appt   Functional Questionnaire:   Activities of Daily Living (ADLs):   She  states he are independent in the following: feeding, continence, grooming and toileting States they require assistance with the following: ambulation, bathing and hygiene and dressing. Wife states he can't see and have to help him get around. He is currently using a walker when he is getting around   Any transportation issues/concerns?: NO   Any patient concerns? NO   Confirmed importance and date/time of follow-up visits scheduled 11/04/17  Provider Appointment booked with Dr. Jenny Reichmann   Confirmed with patient if condition begins to worsen call PCP or go to the ER.  Patient was given the office number and encouraged to call back with question or concerns.  : YES

## 2017-10-30 ENCOUNTER — Encounter: Payer: Self-pay | Admitting: Internal Medicine

## 2017-10-30 DIAGNOSIS — H543 Unqualified visual loss, both eyes: Secondary | ICD-10-CM | POA: Diagnosis not present

## 2017-10-30 DIAGNOSIS — N183 Chronic kidney disease, stage 3 (moderate): Secondary | ICD-10-CM | POA: Diagnosis not present

## 2017-10-30 DIAGNOSIS — I129 Hypertensive chronic kidney disease with stage 1 through stage 4 chronic kidney disease, or unspecified chronic kidney disease: Secondary | ICD-10-CM | POA: Diagnosis not present

## 2017-10-30 DIAGNOSIS — H47013 Ischemic optic neuropathy, bilateral: Secondary | ICD-10-CM | POA: Diagnosis not present

## 2017-10-30 DIAGNOSIS — M316 Other giant cell arteritis: Secondary | ICD-10-CM | POA: Diagnosis not present

## 2017-10-30 DIAGNOSIS — J449 Chronic obstructive pulmonary disease, unspecified: Secondary | ICD-10-CM | POA: Diagnosis not present

## 2017-11-01 ENCOUNTER — Telehealth: Payer: Self-pay | Admitting: Internal Medicine

## 2017-11-01 DIAGNOSIS — I129 Hypertensive chronic kidney disease with stage 1 through stage 4 chronic kidney disease, or unspecified chronic kidney disease: Secondary | ICD-10-CM | POA: Diagnosis not present

## 2017-11-01 DIAGNOSIS — M316 Other giant cell arteritis: Secondary | ICD-10-CM | POA: Diagnosis not present

## 2017-11-01 DIAGNOSIS — H47013 Ischemic optic neuropathy, bilateral: Secondary | ICD-10-CM | POA: Diagnosis not present

## 2017-11-01 DIAGNOSIS — H543 Unqualified visual loss, both eyes: Secondary | ICD-10-CM | POA: Diagnosis not present

## 2017-11-01 DIAGNOSIS — N183 Chronic kidney disease, stage 3 (moderate): Secondary | ICD-10-CM | POA: Diagnosis not present

## 2017-11-01 DIAGNOSIS — J449 Chronic obstructive pulmonary disease, unspecified: Secondary | ICD-10-CM | POA: Diagnosis not present

## 2017-11-01 NOTE — Telephone Encounter (Signed)
Ok for verbals 

## 2017-11-01 NOTE — Telephone Encounter (Signed)
Copied from [place] [phone]. Topic: Quick Communication - See Telephone Encounter >> Nov 01, 2017 10:57 AM [name] wrote: CRM for notification. See Telephone encounter for:  [name] with [facility] needing verbal orders for PT 1x 1 week, 2x 3 weeks, 1x 4 weeks. Call back number [phone].  11/01/17.

## 2017-11-01 NOTE — Telephone Encounter (Signed)
Notified Sreee w/MD verbal../lmb

## 2017-11-02 ENCOUNTER — Telehealth: Payer: Self-pay | Admitting: Internal Medicine

## 2017-11-02 DIAGNOSIS — H3413 Central retinal artery occlusion, bilateral: Secondary | ICD-10-CM | POA: Diagnosis not present

## 2017-11-02 DIAGNOSIS — H47013 Ischemic optic neuropathy, bilateral: Secondary | ICD-10-CM | POA: Diagnosis not present

## 2017-11-02 DIAGNOSIS — J449 Chronic obstructive pulmonary disease, unspecified: Secondary | ICD-10-CM | POA: Diagnosis not present

## 2017-11-02 DIAGNOSIS — M316 Other giant cell arteritis: Secondary | ICD-10-CM | POA: Diagnosis not present

## 2017-11-02 DIAGNOSIS — I129 Hypertensive chronic kidney disease with stage 1 through stage 4 chronic kidney disease, or unspecified chronic kidney disease: Secondary | ICD-10-CM | POA: Diagnosis not present

## 2017-11-02 DIAGNOSIS — N183 Chronic kidney disease, stage 3 (moderate): Secondary | ICD-10-CM | POA: Diagnosis not present

## 2017-11-02 DIAGNOSIS — H3412 Central retinal artery occlusion, left eye: Secondary | ICD-10-CM | POA: Diagnosis not present

## 2017-11-02 DIAGNOSIS — H543 Unqualified visual loss, both eyes: Secondary | ICD-10-CM | POA: Diagnosis not present

## 2017-11-02 NOTE — Telephone Encounter (Signed)
Copied from [place] [phone]. Topic: Quick Communication - See Telephone Encounter >> Nov 02, 2017  2:26 PM Hewitt Shorts wrote: CRM for notification. See Telephone encounter for:  [facility] home health nurse is calling to let the provider know that patient is having issues with his blood pressure of 156/54 and he also would like the provider to call [facility] and get pt started on a for leg walker  Home health nurse believes that the patient has an appt with Dr. Jenny Reichmann tomorrow  11/02/17.

## 2017-11-02 NOTE — Telephone Encounter (Signed)
FYI notes for appt tomorrow

## 2017-11-03 ENCOUNTER — Encounter: Payer: Self-pay | Admitting: Cardiology

## 2017-11-03 ENCOUNTER — Ambulatory Visit: Payer: Medicare HMO | Admitting: Cardiology

## 2017-11-03 ENCOUNTER — Other Ambulatory Visit (INDEPENDENT_AMBULATORY_CARE_PROVIDER_SITE_OTHER): Payer: Medicare HMO

## 2017-11-03 ENCOUNTER — Encounter: Payer: Self-pay | Admitting: Internal Medicine

## 2017-11-03 ENCOUNTER — Ambulatory Visit (INDEPENDENT_AMBULATORY_CARE_PROVIDER_SITE_OTHER): Payer: Medicare HMO | Admitting: Internal Medicine

## 2017-11-03 VITALS — BP 130/68 | HR 64 | Ht 65.0 in | Wt 157.0 lb

## 2017-11-03 VITALS — BP 132/78 | HR 65 | Temp 97.6°F | Ht 65.0 in | Wt 159.0 lb

## 2017-11-03 DIAGNOSIS — M316 Other giant cell arteritis: Secondary | ICD-10-CM

## 2017-11-03 DIAGNOSIS — J449 Chronic obstructive pulmonary disease, unspecified: Secondary | ICD-10-CM

## 2017-11-03 DIAGNOSIS — I340 Nonrheumatic mitral (valve) insufficiency: Secondary | ICD-10-CM | POA: Diagnosis not present

## 2017-11-03 DIAGNOSIS — I350 Nonrheumatic aortic (valve) stenosis: Secondary | ICD-10-CM | POA: Diagnosis not present

## 2017-11-03 DIAGNOSIS — I10 Essential (primary) hypertension: Secondary | ICD-10-CM

## 2017-11-03 LAB — CBC WITH DIFFERENTIAL/PLATELET
Basophils Absolute: 0 10*3/uL (ref 0.0–0.1)
Basophils Relative: 0.2 % (ref 0.0–3.0)
Eosinophils Absolute: 0 10*3/uL (ref 0.0–0.7)
Eosinophils Relative: 0.1 % (ref 0.0–5.0)
HCT: 37.6 % — ABNORMAL LOW (ref 39.0–52.0)
Hemoglobin: 12.6 g/dL — ABNORMAL LOW (ref 13.0–17.0)
Lymphocytes Relative: 9.9 % — ABNORMAL LOW (ref 12.0–46.0)
Lymphs Abs: 2.4 10*3/uL (ref 0.7–4.0)
MCHC: 33.4 g/dL (ref 30.0–36.0)
MCV: 94.6 fl (ref 78.0–100.0)
Monocytes Absolute: 1.2 10*3/uL — ABNORMAL HIGH (ref 0.1–1.0)
Monocytes Relative: 5 % (ref 3.0–12.0)
Neutro Abs: 20.5 10*3/uL — ABNORMAL HIGH (ref 1.4–7.7)
Neutrophils Relative %: 84.8 % — ABNORMAL HIGH (ref 43.0–77.0)
Platelets: 337 10*3/uL (ref 150.0–400.0)
RBC: 3.98 Mil/uL — ABNORMAL LOW (ref 4.22–5.81)
RDW: 15.8 % — ABNORMAL HIGH (ref 11.5–15.5)
WBC: 24.2 10*3/uL (ref 4.0–10.5)

## 2017-11-03 LAB — BASIC METABOLIC PANEL
BUN: 44 mg/dL — ABNORMAL HIGH (ref 6–23)
CO2: 32 mEq/L (ref 19–32)
Calcium: 8.9 mg/dL (ref 8.4–10.5)
Chloride: 96 mEq/L (ref 96–112)
Creatinine, Ser: 1.77 mg/dL — ABNORMAL HIGH (ref 0.40–1.50)
GFR: 38.52 mL/min — AB (ref >60.00)
Glucose, Bld: 111 mg/dL — ABNORMAL HIGH (ref 70–99)
POTASSIUM: 4.2 meq/L (ref 3.5–5.1)
SODIUM: 135 meq/L (ref 135–145)

## 2017-11-03 LAB — HEPATIC FUNCTION PANEL
ALT: 13 U/L (ref 0–53)
AST: 12 U/L (ref 0–37)
Albumin: 3.1 g/dL — ABNORMAL LOW (ref 3.5–5.2)
Alkaline Phosphatase: 71 U/L (ref 39–117)
Bilirubin, Direct: 0 mg/dL (ref 0.0–0.3)
TOTAL PROTEIN: 5.7 g/dL — AB (ref 6.0–8.3)
Total Bilirubin: 0.2 mg/dL (ref 0.2–1.2)

## 2017-11-03 LAB — SEDIMENTATION RATE: Sed Rate: 5 mm/hr (ref 0–20)

## 2017-11-03 NOTE — Progress Notes (Signed)
Subjective:    Patient ID: [name], male    DOB: 21-Jul-1925, 84 y.o.   MRN: [id number]  HPI  Here to f/u with family after recent hopsp with new vision loss, s/p bilateral temporal artery biopsy, with biopsy c/w temporal arteritis.  Pt has no subjective improvement, has very low vision left eye only.  No HA, fever, ST, cough and Pt denies chest pain, increased sob or doe, wheezing, orthopnea, PND, increased LE swelling, palpitations, dizziness or syncope.  Did f/u with Dr Zadie Rhine yesterday who believes arterial insufficinecy per wife has resulted in permanent vision loss on right; left is near complete but still has some left intact, to f/u [facility] neurology  tomorrow AM, seems to tolerate very high dose prednisone except for + mild hallucination with seeing a rat run across the room at home a few times, but did not overly bother him.   Did see Dr Camnitz/card as well.  Wife asks to cancel MRI abd ordered at last visit for wt loss, now that pt has a reason for this, and pt is very much trying to avoid the test due to the noise and difficulty having done  No other new complaints or interval change Past Medical History:  Diagnosis Date  . ALLERGIC RHINITIS 09/29/2007  . Aortic stenosis 02/05/2017  . BACK PAIN 01/31/2009  . BENIGN PROSTATIC HYPERTROPHY 05/12/2007  . CHEST PAIN 01/31/2008  . CHRONIC OBSTRUCTIVE PULMONARY DISEASE, ACUTE EXACERBATION 08/17/2007  . CONJUNCTIVITIS, ALLERGIC, CHRONIC 03/24/2010  . CONSTIPATION 02/19/2010  . COPD 05/12/2007  . GERD 02/23/2010  . GOUT 07/04/2010  . History of CVA (cerebrovascular accident) 05/03/2011  . HYPERLIPIDEMIA 05/12/2007  . HYPERTENSION 05/12/2007  . NEPHROLITHIASIS, HX OF 08/17/2007  . OBESITY 05/12/2007  . PEPTIC ULCER DISEASE 05/12/2007  . PERIPHERAL VASCULAR DISEASE 05/12/2007  . Personal history of unspecified circulatory disease 05/12/2007  . PLANTAR FASCIITIS, RIGHT 09/27/2008  . RENAL CELL CANCER 05/12/2007  . RENAL INSUFFICIENCY 08/17/2007  .  SKIN LESION 11/07/2010  . VERTIGO 09/29/2007  . WRIST PAIN, RIGHT 02/26/2009   Past Surgical History:  Procedure Laterality Date  . ARTERY BIOPSY Bilateral 10/27/2017   Procedure: BILATERAL TEMPORAL ARTERY BIOPSY;  Surgeon: Serafina Mitchell, MD;  Location: [facility] OR;  Service: Vascular;  Laterality: Bilateral;  . CATARACT EXTRACTION    . ear surgury    . NEPHRECTOMY    . NEPHROURETERECTOMY    . s.p left knee replacement  10/08  . s/p bilat fem pop bypass      reports that he has been smoking.  he has never used smokeless tobacco. He reports that he does not drink alcohol or use drugs. family history includes Alcohol abuse in his brother; Cancer in his daughter, mother, other, and other; Heart disease in his brother. Allergies  Allergen Reactions  . Sulfonamide Derivatives     REACTION: rash   Current Outpatient Medications on File Prior to Visit  Medication Sig Dispense Refill  . allopurinol (ZYLOPRIM) 100 MG tablet Take 1 tablet (100 mg total) by mouth daily. 90 tablet 3  . amLODipine (NORVASC) 5 MG tablet Take 1 tablet (5 mg total) by mouth daily. 90 tablet 3  . aspirin EC 81 MG tablet Take 1 tablet (81 mg total) by mouth daily. 90 tablet 3  . cetirizine (ZYRTEC) 10 MG tablet Take 10 mg by mouth daily as needed for allergies.     . hydrochlorothiazide (HYDRODIURIL) 25 MG tablet Take 1 tablet (25 mg total) by mouth daily.  90 tablet 3  . mirtazapine (REMERON) 15 MG tablet Take 1 tablet (15 mg total) by mouth at bedtime. 90 tablet 3  . omeprazole (PRILOSEC) 40 MG capsule Take 1 capsule (40 mg total) by mouth daily. 30 capsule 2  . polyethylene glycol (MIRALAX / GLYCOLAX) packet Take 17 g by mouth daily as needed for mild constipation.    . pravastatin (PRAVACHOL) 40 MG tablet Take 1 tablet (40 mg total) by mouth every evening. 90 tablet 3  . predniSONE (DELTASONE) 20 MG tablet Take 3 tablets (60 mg total) by mouth daily with breakfast. 90 tablet 1   No current facility-administered  medications on file prior to visit.    Review of Systems All other system neg, pt declines to go over specific questions    Objective:   Physical Exam BP 132/78   Pulse 65   Temp 97.6 F (36.4 C) (Oral)   Ht 5\' 5"  (1.651 m)   Wt 159 lb (72.1 kg)   SpO2 96%   BMI 26.46 kg/m  VS noted,  Constitutional: Pt appears in NAD HENT: Head: NCAT.  Right Ear: External ear normal.  Left Ear: External ear normal.  Eyes: . Pupils are equal, round, and reactive to light. Conjunctivae and EOM are normal Nose: without d/c or deformity Neck: Neck supple. Gross normal ROM Cardiovascular: Normal rate and regular rhythm.   Pulmonary/Chest: Effort normal and breath sounds without rales or wheezing.  Abd:  Soft, NT, ND, + BS, no organomegaly Neurological: Pt is alert. At baseline orientation, motor grossly intact, cn 2-12 intact except for low vision left eye Skin: Skin is warm. No rashes, has other new bitemporal surgical wounds healing well without s/s of infection, no LE edema Psychiatric: Pt behavior is normal without agitation  No other exam findings    Assessment & Plan:

## 2017-11-03 NOTE — Patient Instructions (Signed)
Medication Instructions:  Your physician recommends that you continue on your current medications as directed. Please refer to the Current Medication list given to you today.  If you need a refill on your cardiac medications before your next appointment, please call your pharmacy.   Labwork: None ordered  Testing/Procedures: None ordered  Follow-Up: Your physician wants you to follow-up in: 6 months with Dr. Camnitz.  You will receive a reminder letter in the mail two months in advance. If you don't receive a letter, please call our office to schedule the follow-up appointment.  Thank you for choosing [facility]!!   Dagon Budai, RN [phone]         

## 2017-11-03 NOTE — Progress Notes (Signed)
Electrophysiology Office Note   Date:  11/03/2017   ID:  [name], DOB 12-23-1924, MRN [id number]  PCP:  [name], MD Primary Electrophysiologist:  [name], MD    Chief Complaint  Patient presents with  . Follow-up    C. Stroke     History of Present Illness: [name] is a 84 y.o. male who is being seen today for the evaluation of heart murmur at the request of [name], MD. Presenting today for electrophysiology evaluation. He is found to have a murmur by his primary physician who ordered an echocardiogram which showed moderate aortic stenosis as well as mild mitral regurgitation.  Today, denies symptoms of palpitations, chest pain, shortness of breath, orthopnea, PND, lower extremity edema, claudication, dizziness, presyncope, syncope, bleeding, or neurologic sequela. The patient is tolerating medications without difficulties.  He was recently hospitalized with acute vision loss of his right eye and optic neuropathy.  He had a temporal artery biopsy which was negative.  MRI showed right retrobulbar optic nerve inflammation and papilledema consistent with anterior optic neuropathy but no evidence of acute stroke.  Of note the patient is a World War II veteran.  Past Medical History:  Diagnosis Date  . ALLERGIC RHINITIS 09/29/2007  . Aortic stenosis 02/05/2017  . BACK PAIN 01/31/2009  . BENIGN PROSTATIC HYPERTROPHY 05/12/2007  . CHEST PAIN 01/31/2008  . CHRONIC OBSTRUCTIVE PULMONARY DISEASE, ACUTE EXACERBATION 08/17/2007  . CONJUNCTIVITIS, ALLERGIC, CHRONIC 03/24/2010  . CONSTIPATION 02/19/2010  . COPD 05/12/2007  . GERD 02/23/2010  . GOUT 07/04/2010  . History of CVA (cerebrovascular accident) 05/03/2011  . HYPERLIPIDEMIA 05/12/2007  . HYPERTENSION 05/12/2007  . NEPHROLITHIASIS, HX OF 08/17/2007  . OBESITY 05/12/2007  . PEPTIC ULCER DISEASE 05/12/2007  . PERIPHERAL VASCULAR DISEASE 05/12/2007  . Personal history of unspecified circulatory disease 05/12/2007    . PLANTAR FASCIITIS, RIGHT 09/27/2008  . RENAL CELL CANCER 05/12/2007  . RENAL INSUFFICIENCY 08/17/2007  . SKIN LESION 11/07/2010  . VERTIGO 09/29/2007  . WRIST PAIN, RIGHT 02/26/2009   Past Surgical History:  Procedure Laterality Date  . ARTERY BIOPSY Bilateral 10/27/2017   Procedure: BILATERAL TEMPORAL ARTERY BIOPSY;  Surgeon: Serafina Mitchell, MD;  Location: [facility] OR;  Service: Vascular;  Laterality: Bilateral;  . CATARACT EXTRACTION    . ear surgury    . NEPHRECTOMY    . NEPHROURETERECTOMY    . s.p left knee replacement  10/08  . s/p bilat fem pop bypass       Current Outpatient Medications  Medication Sig Dispense Refill  . allopurinol (ZYLOPRIM) 100 MG tablet Take 1 tablet (100 mg total) by mouth daily. 90 tablet 3  . amLODipine (NORVASC) 5 MG tablet Take 1 tablet (5 mg total) by mouth daily. 90 tablet 3  . aspirin EC 81 MG tablet Take 1 tablet (81 mg total) by mouth daily. 90 tablet 3  . cetirizine (ZYRTEC) 10 MG tablet Take 10 mg by mouth daily as needed for allergies.     . hydrochlorothiazide (HYDRODIURIL) 25 MG tablet Take 1 tablet (25 mg total) by mouth daily. 90 tablet 3  . mirtazapine (REMERON) 15 MG tablet Take 1 tablet (15 mg total) by mouth at bedtime. 90 tablet 3  . omeprazole (PRILOSEC) 40 MG capsule Take 1 capsule (40 mg total) by mouth daily. 30 capsule 2  . polyethylene glycol (MIRALAX / GLYCOLAX) packet Take 17 g by mouth daily as needed for mild constipation.    . pravastatin (PRAVACHOL)  40 MG tablet Take 1 tablet (40 mg total) by mouth every evening. 90 tablet 3  . predniSONE (DELTASONE) 20 MG tablet Take 3 tablets (60 mg total) by mouth daily with breakfast. 90 tablet 1   No current facility-administered medications for this visit.     Allergies:   Sulfonamide derivatives   Social History:  The patient  reports that he has been smoking.  he has never used smokeless tobacco. He reports that he does not drink alcohol or use drugs.   Family History:  The  patient's family history includes Alcohol abuse in his brother; Cancer in his daughter, mother, other, and other; Heart disease in his brother.   ROS:  Please see the history of present illness.   Otherwise, review of systems is positive for none.   All other systems are reviewed and negative.   PHYSICAL EXAM: VS:  BP 130/68   Pulse 64   Ht 5\' 5"  (1.651 m)   Wt 157 lb (71.2 kg)   BMI 26.13 kg/m  , BMI Body mass index is 26.13 kg/m. GEN: Well nourished, well developed, in no acute distress  HEENT: normal  Neck: no JVD, carotid bruits, or masses Cardiac: RRR; 2 out of 6 systolic murmur at the base, no rubs, or gallops,no edema  Respiratory:  clear to auscultation bilaterally, normal work of breathing GI: soft, nontender, nondistended, + BS MS: no deformity or atrophy  Skin: warm and dry Neuro:  Strength and sensation are intact Psych: euthymic mood, full affect  EKG:  EKG is not ordered today. Personal review of the ekg ordered 2/26/*19 shows sinus rhythm, 1dAVB, RBBB   Recent Labs: 10/18/2017: TSH 2.15 10/25/2017: ALT 10 10/27/2017: BUN 30; Creatinine, Ser 1.78; Hemoglobin 11.2; Platelets 369; Potassium 3.8; Sodium 136    Lipid Panel     Component Value Date/Time   CHOL 101 10/26/2017 0217   TRIG 103 10/26/2017 0217   HDL 37 (L) 10/26/2017 0217   CHOLHDL 2.7 10/26/2017 0217   VLDL 21 10/26/2017 0217   LDLCALC 43 10/26/2017 0217     Wt Readings from Last 3 Encounters:  11/03/17 157 lb (71.2 kg)  10/26/17 149 lb (67.6 kg)  10/18/17 149 lb (67.6 kg)      Other studies Reviewed: Additional studies/ records that were reviewed today include: TTE 02/05/17  Review of the above records today demonstrates:  - Left ventricle: The cavity size was normal. There was mild focal   basal hypertrophy of the septum. Systolic function was normal.   The estimated ejection fraction was in the range of 60% to 65%.   Left ventricular diastolic function parameters were normal. - Aortic  valve: There was moderate stenosis. There was mild   regurgitation. Valve area (VTI): 1.18 cm^2. Valve area (Vmax): 1   cm^2. Valve area (Vmean): 0.89 cm^2. - Mitral valve: Calcified annulus. Mildly thickened leaflets .   There was mild regurgitation. Valve area by pressure half-time:   1.52 cm^2. - Left atrium: The atrium was mildly dilated. - Pulmonary arteries: PA peak pressure: 31 mm Hg (S).   ASSESSMENT AND PLAN:  1.  Moderate aortic stenosis: On echo.  Chest pain, shortness of breath, or syncope.  No changes at this time.  2. Mild Mitral regurgitation: Currently asymptomatic.  No changes.  3. Hypertension: Well-controlled.  No changes.  4. Tobacco abuse: No interest in quitting.  Current medicines are reviewed at length with the patient today.   The patient does not have concerns regarding  his medicines.  The following changes were made today: None  Labs/ tests ordered today include:  No orders of the defined types were placed in this encounter.    Disposition:   FU with Will Camnitz 6 months  Signed, Will Meredith Leeds, MD  11/03/2017 10:56 AM     [facility] [address] [place] [address] [phone] (office) [phone] (fax)

## 2017-11-03 NOTE — Patient Instructions (Signed)
Please continue all other medications as before, and refills have been done if requested.  Please have the pharmacy call with any other refills you may need.  Please keep your appointments with your specialists as you may have planned  Please go to the LAB in the Basement (turn left off the elevator) for the tests to be done today  You will be contacted by phone if any changes need to be made immediately.  Otherwise, you will receive a letter about your results with an explanation, but please check with MyChart first.  Please remember to sign up for MyChart if you have not done so, as this will be important to you in the future with finding out test results, communicating by private email, and scheduling acute appointments online when needed.  Please return in 3 months, or sooner if needed 

## 2017-11-04 DIAGNOSIS — Z961 Presence of intraocular lens: Secondary | ICD-10-CM | POA: Diagnosis not present

## 2017-11-04 DIAGNOSIS — M316 Other giant cell arteritis: Secondary | ICD-10-CM | POA: Diagnosis not present

## 2017-11-04 DIAGNOSIS — H04123 Dry eye syndrome of bilateral lacrimal glands: Secondary | ICD-10-CM | POA: Diagnosis not present

## 2017-11-05 ENCOUNTER — Telehealth: Payer: Self-pay | Admitting: Internal Medicine

## 2017-11-05 DIAGNOSIS — J449 Chronic obstructive pulmonary disease, unspecified: Secondary | ICD-10-CM | POA: Diagnosis not present

## 2017-11-05 DIAGNOSIS — H47013 Ischemic optic neuropathy, bilateral: Secondary | ICD-10-CM | POA: Diagnosis not present

## 2017-11-05 DIAGNOSIS — H543 Unqualified visual loss, both eyes: Secondary | ICD-10-CM | POA: Diagnosis not present

## 2017-11-05 DIAGNOSIS — I129 Hypertensive chronic kidney disease with stage 1 through stage 4 chronic kidney disease, or unspecified chronic kidney disease: Secondary | ICD-10-CM | POA: Diagnosis not present

## 2017-11-05 DIAGNOSIS — M316 Other giant cell arteritis: Secondary | ICD-10-CM | POA: Diagnosis not present

## 2017-11-05 DIAGNOSIS — N183 Chronic kidney disease, stage 3 (moderate): Secondary | ICD-10-CM | POA: Diagnosis not present

## 2017-11-05 NOTE — Telephone Encounter (Signed)
Copied from [place] [phone]. Topic: Quick Communication - See Telephone Encounter >> Nov 05, 2017 10:42 AM Margot Ables wrote: CRM for notification. See Telephone encounter for: 11/05/17.  Reporting weight increase 10/30/17 wt 156.2 with shoes on 11/05/17 wt 159.6 with shoes on 11/05/17 BP increased to 158/70

## 2017-11-06 ENCOUNTER — Other Ambulatory Visit: Payer: Medicare HMO

## 2017-11-06 NOTE — Assessment & Plan Note (Addendum)
Unfortunately with severe residual vision loss not likely to improve, still has some vision on left, pt to continue high dose prednisone with f/u labs today as ordered, also refer Rheumatology for f/u  Note:  Total time for pt hx, exam, review of record with pt in the room, determination of diagnoses and plan for further eval and tx is > 40 min, with over 50% spent in coordination and counseling of patient including the differential dx, tx, further evaluation and other management of temporal arteritis, copd and HTN

## 2017-11-06 NOTE — Assessment & Plan Note (Signed)
stable overall by history and exam, recent data reviewed with pt, and pt to continue medical treatment as before,  to f/u any worsening symptoms or concerns BP Readings from Last 3 Encounters:  11/03/17 132/78  11/03/17 130/68  10/28/17 128/75

## 2017-11-06 NOTE — Assessment & Plan Note (Signed)
stable overall by history and exam, and pt to continue medical treatment as before,  to f/u any worsening symptoms or concerns 

## 2017-11-08 ENCOUNTER — Ambulatory Visit (INDEPENDENT_AMBULATORY_CARE_PROVIDER_SITE_OTHER): Payer: Medicare HMO | Admitting: Internal Medicine

## 2017-11-08 ENCOUNTER — Encounter: Payer: Self-pay | Admitting: Internal Medicine

## 2017-11-08 VITALS — BP 146/88 | HR 68 | Temp 97.6°F | Ht 65.0 in | Wt 169.0 lb

## 2017-11-08 DIAGNOSIS — R609 Edema, unspecified: Secondary | ICD-10-CM | POA: Insufficient documentation

## 2017-11-08 DIAGNOSIS — M316 Other giant cell arteritis: Secondary | ICD-10-CM

## 2017-11-08 DIAGNOSIS — I129 Hypertensive chronic kidney disease with stage 1 through stage 4 chronic kidney disease, or unspecified chronic kidney disease: Secondary | ICD-10-CM | POA: Diagnosis not present

## 2017-11-08 DIAGNOSIS — I10 Essential (primary) hypertension: Secondary | ICD-10-CM | POA: Diagnosis not present

## 2017-11-08 DIAGNOSIS — J449 Chronic obstructive pulmonary disease, unspecified: Secondary | ICD-10-CM | POA: Diagnosis not present

## 2017-11-08 DIAGNOSIS — B370 Candidal stomatitis: Secondary | ICD-10-CM | POA: Diagnosis not present

## 2017-11-08 DIAGNOSIS — H47013 Ischemic optic neuropathy, bilateral: Secondary | ICD-10-CM | POA: Diagnosis not present

## 2017-11-08 DIAGNOSIS — N183 Chronic kidney disease, stage 3 (moderate): Secondary | ICD-10-CM | POA: Diagnosis not present

## 2017-11-08 DIAGNOSIS — H543 Unqualified visual loss, both eyes: Secondary | ICD-10-CM | POA: Diagnosis not present

## 2017-11-08 MED ORDER — PREDNISONE 20 MG PO TABS: 40.0000 mg | ORAL_TABLET | Freq: Every day | ORAL | 1 refills | Status: AC

## 2017-11-08 MED ORDER — NYSTATIN 100000 UNIT/ML MT SUSP: 500000.0000 [IU] | Freq: Four times a day (QID) | OROMUCOSAL | 1 refills | Status: AC

## 2017-11-08 MED ORDER — FUROSEMIDE 40 MG PO TABS: 40.0000 mg | ORAL_TABLET | Freq: Every day | ORAL | 11 refills | Status: DC

## 2017-11-08 NOTE — Progress Notes (Signed)
Subjective:    Patient ID: [name], male    DOB: 1925-08-19, 84 y.o.   MRN: [id number]  HPI  Here to f/u with wife and son for support, overall no subjective change in left vision and can still see some light, shapes.  No pain and bilat temporal artery biopsy sites without difficulty.  Did see optho at [facility] last Thursday and prednisone reduced to 50 mg per day (from 60).  Unfortunately, since last visit, pt has gained dramatic weight and now with LE edema.  Pt denies chest pain, increased sob or doe, wheezing, orthopnea, PND, palpitations, dizziness or syncope.  Has hx of CKD and solitary kidney after renal cancer.  Also has sudden onset very uncomfortable tongue and mouth pain with white coating, somewhat to post throat as well.   Wt Readings from Last 3 Encounters:  11/08/17 169 lb (76.7 kg)  11/03/17 159 lb (72.1 kg)  11/03/17 157 lb (71.2 kg)   Past Medical History:  Diagnosis Date  . ALLERGIC RHINITIS 09/29/2007  . Aortic stenosis 02/05/2017  . BACK PAIN 01/31/2009  . BENIGN PROSTATIC HYPERTROPHY 05/12/2007  . CHEST PAIN 01/31/2008  . CHRONIC OBSTRUCTIVE PULMONARY DISEASE, ACUTE EXACERBATION 08/17/2007  . CONJUNCTIVITIS, ALLERGIC, CHRONIC 03/24/2010  . CONSTIPATION 02/19/2010  . COPD 05/12/2007  . GERD 02/23/2010  . GOUT 07/04/2010  . History of CVA (cerebrovascular accident) 05/03/2011  . HYPERLIPIDEMIA 05/12/2007  . HYPERTENSION 05/12/2007  . NEPHROLITHIASIS, HX OF 08/17/2007  . OBESITY 05/12/2007  . PEPTIC ULCER DISEASE 05/12/2007  . PERIPHERAL VASCULAR DISEASE 05/12/2007  . Personal history of unspecified circulatory disease 05/12/2007  . PLANTAR FASCIITIS, RIGHT 09/27/2008  . RENAL CELL CANCER 05/12/2007  . RENAL INSUFFICIENCY 08/17/2007  . SKIN LESION 11/07/2010  . VERTIGO 09/29/2007  . WRIST PAIN, RIGHT 02/26/2009   Past Surgical History:  Procedure Laterality Date  . ARTERY BIOPSY Bilateral 10/27/2017   Procedure: BILATERAL TEMPORAL ARTERY BIOPSY;  Surgeon: Serafina Mitchell, MD;   Location: [facility] OR;  Service: Vascular;  Laterality: Bilateral;  . CATARACT EXTRACTION    . ear surgury    . NEPHRECTOMY    . NEPHROURETERECTOMY    . s.p left knee replacement  10/08  . s/p bilat fem pop bypass      reports that he has been smoking.  he has never used smokeless tobacco. He reports that he does not drink alcohol or use drugs. family history includes Alcohol abuse in his brother; Cancer in his daughter, mother, other, and other; Heart disease in his brother. Allergies  Allergen Reactions  . Sulfonamide Derivatives     REACTION: rash   Current Outpatient Medications on File Prior to Visit  Medication Sig Dispense Refill  . allopurinol (ZYLOPRIM) 100 MG tablet Take 1 tablet (100 mg total) by mouth daily. 90 tablet 3  . amLODipine (NORVASC) 5 MG tablet Take 1 tablet (5 mg total) by mouth daily. 90 tablet 3  . aspirin EC 81 MG tablet Take 1 tablet (81 mg total) by mouth daily. 90 tablet 3  . cetirizine (ZYRTEC) 10 MG tablet Take 10 mg by mouth daily as needed for allergies.     . mirtazapine (REMERON) 15 MG tablet Take 1 tablet (15 mg total) by mouth at bedtime. 90 tablet 3  . omeprazole (PRILOSEC) 40 MG capsule Take 1 capsule (40 mg total) by mouth daily. 30 capsule 2  . polyethylene glycol (MIRALAX / GLYCOLAX) packet Take 17 g by mouth daily as needed for mild constipation.    Marland Kitchen  pravastatin (PRAVACHOL) 40 MG tablet Take 1 tablet (40 mg total) by mouth every evening. 90 tablet 3   No current facility-administered medications on file prior to visit.    Review of Systems  Constitutional: Negative for other unusual diaphoresis or sweats HENT: Negative for ear discharge or swelling Eyes: Negative for other worsening visual disturbances Respiratory: Negative for stridor or other swelling  Gastrointestinal: Negative for worsening distension or other blood Genitourinary: Negative for retention or other urinary change Musculoskeletal: Negative for other MSK pain or swelling Skin:  Negative for color change or other new lesions Neurological: Negative for worsening tremors and other numbness  Psychiatric/Behavioral: Negative for worsening agitation or other fatigue All other system neg per pt    Objective:   Physical Exam BP (!) 146/88   Pulse 68   Temp 97.6 F (36.4 C) (Oral)   Ht 5\' 5"  (1.651 m)   Wt 169 lb (76.7 kg)   SpO2 96%   BMI 28.12 kg/m  VS noted, frail, examined in wheelchair Constitutional: Pt appears in NAD HENT: Head: NCAT.  Right Ear: External ear normal.  Left Ear: External ear normal.  Eyes: . Pupils are equal, round, and reactive to light. Conjunctivae and EOM are normal + marked thrush to dorsal tongue Nose: without d/c or deformity Neck: Neck supple. Gross normal ROM Cardiovascular: Normal rate and regular rhythm.   Pulmonary/Chest: Effort normal and breath sounds without rales or wheezing.  Abd:  Soft, NT, ND, + BS, no organomegaly Neurological: Pt is alert. At baseline orientation, motor grossly intact Skin: Skin is warm. No rashes, other new lesions, has 2+ LE edema to just below the knees bilat Psychiatric: Pt behavior is normal without agitation  No other exam findings    Assessment & Plan:

## 2017-11-08 NOTE — Telephone Encounter (Signed)
Caller name:Stree  Relation to pt: PT from [facility]  Call back number: [phone]   Reason for call:   Checking on the status of 11/05/17 telephone note   Physical therapist wanted to report today patient BP being low 152/50 and body wieght increased from the weekend, body weight is 161 today. Both legs are swollen, please advise

## 2017-11-08 NOTE — Assessment & Plan Note (Addendum)
Ok for reduced prednisone to 40 mg daily, has new pt appt with rheum later this wk per wife  Note:  Total time for pt hx, exam, review of record with pt in the room, determination of diagnoses and plan for further eval and tx is > 40 min, with over 50% spent in coordination and counseling of patient including the differential dx, tx, further evaluation and other management of temporal arteritis, peripheral edema, and HTN

## 2017-11-08 NOTE — Assessment & Plan Note (Signed)
Mild elev today likely related to higher volume, o/w stable overall by history and exam, recent data reviewed with pt, and pt to continue medical treatment as before,  to f/u any worsening symptoms or concerns BP Readings from Last 3 Encounters:  11/08/17 (!) 146/88  11/03/17 132/78  11/03/17 130/68

## 2017-11-08 NOTE — Telephone Encounter (Signed)
I have talked with [name], who is PT/[facility] for patient---patient has gained 6 pounds in one week and both legs swollen---patient has scheduled appt with dr Jenny Reichmann today at 3:40pm

## 2017-11-08 NOTE — Assessment & Plan Note (Signed)
Mild to mod, for nystatin soln asd,  to f/u any worsening symptoms or concerns 

## 2017-11-08 NOTE — Patient Instructions (Addendum)
OK to decrease the prednisone to 40 mg per day  OK to stop the HCTZ (hydrochlorothiazide)  Please take all new medication as prescribed - the mouth solution for thrush  Please take all new medication as prescribed - the generic Lasix (furosemide) at 40 mg per day  Please check your weight first thing in the AM every day, as we are looking for your weight to come down along with the swelling in the legs;  The goal is maybe 1/2 lb - 1 lb per day.    Please continue all other medications as before, and refills have been done if requested.  Please have the pharmacy call with any other refills you may need.  Please keep your appointments with your specialists as you may have planned  Please return in 1 week, or sooner if needed

## 2017-11-08 NOTE — Assessment & Plan Note (Signed)
Suspect cardiorenal issue related to possible diastolic dysfxn and CKD exacerbated by fluid retention due to high dose steroid; to d/c HCT, start Lasix 40qd, check daily wts with goal wt los 1/2- 1 lbs per day (warned of freq urination and possible need for urinal), and f/u 1 wk, will likely need BMP then, hold on start K for now given CKD

## 2017-11-10 DIAGNOSIS — Z6827 Body mass index (BMI) 27.0-27.9, adult: Secondary | ICD-10-CM | POA: Diagnosis not present

## 2017-11-10 DIAGNOSIS — E663 Overweight: Secondary | ICD-10-CM | POA: Diagnosis not present

## 2017-11-10 DIAGNOSIS — M316 Other giant cell arteritis: Secondary | ICD-10-CM | POA: Diagnosis not present

## 2017-11-10 DIAGNOSIS — J449 Chronic obstructive pulmonary disease, unspecified: Secondary | ICD-10-CM | POA: Diagnosis not present

## 2017-11-10 DIAGNOSIS — R5383 Other fatigue: Secondary | ICD-10-CM | POA: Diagnosis not present

## 2017-11-10 DIAGNOSIS — N183 Chronic kidney disease, stage 3 (moderate): Secondary | ICD-10-CM | POA: Diagnosis not present

## 2017-11-11 DIAGNOSIS — J449 Chronic obstructive pulmonary disease, unspecified: Secondary | ICD-10-CM | POA: Diagnosis not present

## 2017-11-11 DIAGNOSIS — N183 Chronic kidney disease, stage 3 (moderate): Secondary | ICD-10-CM | POA: Diagnosis not present

## 2017-11-11 DIAGNOSIS — M316 Other giant cell arteritis: Secondary | ICD-10-CM | POA: Diagnosis not present

## 2017-11-11 DIAGNOSIS — H543 Unqualified visual loss, both eyes: Secondary | ICD-10-CM | POA: Diagnosis not present

## 2017-11-11 DIAGNOSIS — I129 Hypertensive chronic kidney disease with stage 1 through stage 4 chronic kidney disease, or unspecified chronic kidney disease: Secondary | ICD-10-CM | POA: Diagnosis not present

## 2017-11-11 DIAGNOSIS — H47013 Ischemic optic neuropathy, bilateral: Secondary | ICD-10-CM | POA: Diagnosis not present

## 2017-11-13 ENCOUNTER — Other Ambulatory Visit: Payer: Self-pay | Admitting: Internal Medicine

## 2017-11-15 DIAGNOSIS — H47013 Ischemic optic neuropathy, bilateral: Secondary | ICD-10-CM | POA: Diagnosis not present

## 2017-11-15 DIAGNOSIS — J449 Chronic obstructive pulmonary disease, unspecified: Secondary | ICD-10-CM | POA: Diagnosis not present

## 2017-11-15 DIAGNOSIS — I129 Hypertensive chronic kidney disease with stage 1 through stage 4 chronic kidney disease, or unspecified chronic kidney disease: Secondary | ICD-10-CM | POA: Diagnosis not present

## 2017-11-15 DIAGNOSIS — M316 Other giant cell arteritis: Secondary | ICD-10-CM | POA: Diagnosis not present

## 2017-11-15 DIAGNOSIS — H543 Unqualified visual loss, both eyes: Secondary | ICD-10-CM | POA: Diagnosis not present

## 2017-11-15 DIAGNOSIS — N183 Chronic kidney disease, stage 3 (moderate): Secondary | ICD-10-CM | POA: Diagnosis not present

## 2017-11-16 ENCOUNTER — Other Ambulatory Visit: Payer: Self-pay | Admitting: Internal Medicine

## 2017-11-16 DIAGNOSIS — F17210 Nicotine dependence, cigarettes, uncomplicated: Secondary | ICD-10-CM

## 2017-11-16 DIAGNOSIS — I739 Peripheral vascular disease, unspecified: Secondary | ICD-10-CM

## 2017-11-16 DIAGNOSIS — Z905 Acquired absence of kidney: Secondary | ICD-10-CM

## 2017-11-16 DIAGNOSIS — I129 Hypertensive chronic kidney disease with stage 1 through stage 4 chronic kidney disease, or unspecified chronic kidney disease: Secondary | ICD-10-CM | POA: Diagnosis not present

## 2017-11-16 DIAGNOSIS — H47013 Ischemic optic neuropathy, bilateral: Secondary | ICD-10-CM | POA: Diagnosis not present

## 2017-11-16 DIAGNOSIS — K279 Peptic ulcer, site unspecified, unspecified as acute or chronic, without hemorrhage or perforation: Secondary | ICD-10-CM

## 2017-11-16 DIAGNOSIS — N183 Chronic kidney disease, stage 3 (moderate): Secondary | ICD-10-CM | POA: Diagnosis not present

## 2017-11-16 DIAGNOSIS — Z7982 Long term (current) use of aspirin: Secondary | ICD-10-CM

## 2017-11-16 DIAGNOSIS — J449 Chronic obstructive pulmonary disease, unspecified: Secondary | ICD-10-CM | POA: Diagnosis not present

## 2017-11-16 DIAGNOSIS — K219 Gastro-esophageal reflux disease without esophagitis: Secondary | ICD-10-CM

## 2017-11-16 DIAGNOSIS — M1030 Gout due to renal impairment, unspecified site: Secondary | ICD-10-CM

## 2017-11-16 DIAGNOSIS — M722 Plantar fascial fibromatosis: Secondary | ICD-10-CM | POA: Diagnosis not present

## 2017-11-16 DIAGNOSIS — Z85528 Personal history of other malignant neoplasm of kidney: Secondary | ICD-10-CM

## 2017-11-16 DIAGNOSIS — Z7952 Long term (current) use of systemic steroids: Secondary | ICD-10-CM

## 2017-11-16 DIAGNOSIS — M5416 Radiculopathy, lumbar region: Secondary | ICD-10-CM | POA: Diagnosis not present

## 2017-11-16 DIAGNOSIS — I700 Atherosclerosis of aorta: Secondary | ICD-10-CM | POA: Diagnosis not present

## 2017-11-16 DIAGNOSIS — I350 Nonrheumatic aortic (valve) stenosis: Secondary | ICD-10-CM

## 2017-11-16 DIAGNOSIS — N400 Enlarged prostate without lower urinary tract symptoms: Secondary | ICD-10-CM

## 2017-11-16 DIAGNOSIS — F329 Major depressive disorder, single episode, unspecified: Secondary | ICD-10-CM | POA: Diagnosis not present

## 2017-11-16 DIAGNOSIS — M316 Other giant cell arteritis: Secondary | ICD-10-CM | POA: Diagnosis not present

## 2017-11-16 DIAGNOSIS — H543 Unqualified visual loss, both eyes: Secondary | ICD-10-CM | POA: Diagnosis not present

## 2017-11-16 DIAGNOSIS — Z8673 Personal history of transient ischemic attack (TIA), and cerebral infarction without residual deficits: Secondary | ICD-10-CM

## 2017-11-17 ENCOUNTER — Ambulatory Visit (INDEPENDENT_AMBULATORY_CARE_PROVIDER_SITE_OTHER): Payer: Medicare HMO | Admitting: Internal Medicine

## 2017-11-17 ENCOUNTER — Other Ambulatory Visit (INDEPENDENT_AMBULATORY_CARE_PROVIDER_SITE_OTHER): Payer: Medicare HMO

## 2017-11-17 ENCOUNTER — Encounter: Payer: Self-pay | Admitting: Internal Medicine

## 2017-11-17 VITALS — BP 120/72 | HR 69 | Temp 97.8°F | Ht 65.0 in | Wt 163.0 lb

## 2017-11-17 DIAGNOSIS — F32A Depression, unspecified: Secondary | ICD-10-CM

## 2017-11-17 DIAGNOSIS — R609 Edema, unspecified: Secondary | ICD-10-CM

## 2017-11-17 DIAGNOSIS — F329 Major depressive disorder, single episode, unspecified: Secondary | ICD-10-CM | POA: Diagnosis not present

## 2017-11-17 DIAGNOSIS — M316 Other giant cell arteritis: Secondary | ICD-10-CM

## 2017-11-17 LAB — CBC WITH DIFFERENTIAL/PLATELET
BASOS ABS: 0 10*3/uL (ref 0.0–0.1)
Basophils Relative: 0.3 % (ref 0.0–3.0)
EOS PCT: 0 % (ref 0.0–5.0)
Eosinophils Absolute: 0 10*3/uL (ref 0.0–0.7)
HEMATOCRIT: 37.2 % — AB (ref 39.0–52.0)
Hemoglobin: 12.5 g/dL — ABNORMAL LOW (ref 13.0–17.0)
LYMPHS ABS: 1 10*3/uL (ref 0.7–4.0)
LYMPHS PCT: 7.2 % — AB (ref 12.0–46.0)
MCHC: 33.6 g/dL (ref 30.0–36.0)
MCV: 94.1 fl (ref 78.0–100.0)
MONOS PCT: 7 % (ref 3.0–12.0)
Monocytes Absolute: 1 10*3/uL (ref 0.1–1.0)
Neutro Abs: 12.4 10*3/uL — ABNORMAL HIGH (ref 1.4–7.7)
Platelets: 206 10*3/uL (ref 150.0–400.0)
RBC: 3.95 Mil/uL — AB (ref 4.22–5.81)
RDW: 16.6 % — ABNORMAL HIGH (ref 11.5–15.5)
WBC: 14.5 10*3/uL — ABNORMAL HIGH (ref 4.0–10.5)

## 2017-11-17 NOTE — Patient Instructions (Signed)
Ok to take Lasix 40 mg at TWICE per day for 3 days only, then back to once in the AM  Please continue all other medications as before, and refills have been done if requested.  Please have the pharmacy call with any other refills you may need.  Please keep your appointments with your specialists as you may have planned - Rheumatology next week  Please go to the LAB in the Basement (turn left off the elevator) for the tests to be done today  You will be contacted by phone if any changes need to be made immediately.  Otherwise, you will receive a letter about your results with an explanation, but please check with MyChart first.  Please remember to sign up for MyChart if you have not done so, as this will be important to you in the future with finding out test results, communicating by private email, and scheduling acute appointments online when needed.  Please return in 6 months, or sooner if needed

## 2017-11-17 NOTE — Progress Notes (Signed)
Subjective:    Patient ID: [name], male    DOB: Sep 22, 1924, 84 y.o.   MRN: [id number]  HPI  Here to f/u with wife for support, overall doing better, now on prednisone 20 bid.  Now can see more facial details with left eye only, but "I feel 100% better."   Also has had significant wt and leg swelling less with taking the lasix, without cramps and Pt denies chest pain, increased sob or doe, wheezing, orthopnea, PND, increased LE swelling, palpitations, dizziness or syncope. Wt Readings from Last 3 Encounters:  11/17/17 163 lb (73.9 kg)  11/08/17 169 lb (76.7 kg)  11/03/17 159 lb (72.1 kg)   BP Readings from Last 3 Encounters:  11/17/17 120/72  11/08/17 (!) 146/88  11/03/17 132/78  Did see rheumatology last wk, plan to change steroid to some other type of anti-inflammatory but has to be approved by insurance.   Denies worsening depressive symptoms, suicidal ideation, or panic; has ongoing anxiety, not increased recently. Past Medical History:  Diagnosis Date  . ALLERGIC RHINITIS 09/29/2007  . Aortic stenosis 02/05/2017  . BACK PAIN 01/31/2009  . BENIGN PROSTATIC HYPERTROPHY 05/12/2007  . CHEST PAIN 01/31/2008  . CHRONIC OBSTRUCTIVE PULMONARY DISEASE, ACUTE EXACERBATION 08/17/2007  . CONJUNCTIVITIS, ALLERGIC, CHRONIC 03/24/2010  . CONSTIPATION 02/19/2010  . COPD 05/12/2007  . GERD 02/23/2010  . GOUT 07/04/2010  . History of CVA (cerebrovascular accident) 05/03/2011  . HYPERLIPIDEMIA 05/12/2007  . HYPERTENSION 05/12/2007  . NEPHROLITHIASIS, HX OF 08/17/2007  . OBESITY 05/12/2007  . PEPTIC ULCER DISEASE 05/12/2007  . PERIPHERAL VASCULAR DISEASE 05/12/2007  . Personal history of unspecified circulatory disease 05/12/2007  . PLANTAR FASCIITIS, RIGHT 09/27/2008  . RENAL CELL CANCER 05/12/2007  . RENAL INSUFFICIENCY 08/17/2007  . SKIN LESION 11/07/2010  . VERTIGO 09/29/2007  . WRIST PAIN, RIGHT 02/26/2009   Past Surgical History:  Procedure Laterality Date  . ARTERY BIOPSY Bilateral 10/27/2017   Procedure: BILATERAL TEMPORAL ARTERY BIOPSY;  Surgeon: Serafina Mitchell, MD;  Location: [facility] OR;  Service: Vascular;  Laterality: Bilateral;  . CATARACT EXTRACTION    . ear surgury    . NEPHRECTOMY    . NEPHROURETERECTOMY    . s.p left knee replacement  10/08  . s/p bilat fem pop bypass      reports that he has been smoking.  He has never used smokeless tobacco. He reports that he does not drink alcohol or use drugs. family history includes Alcohol abuse in his brother; Cancer in his daughter, mother, other, and other; Heart disease in his brother. Allergies  Allergen Reactions  . Sulfonamide Derivatives     REACTION: rash   Current Outpatient Medications on File Prior to Visit  Medication Sig Dispense Refill  . allopurinol (ZYLOPRIM) 100 MG tablet Take 1 tablet (100 mg total) by mouth daily. 90 tablet 3  . amLODipine (NORVASC) 5 MG tablet Take 1 tablet (5 mg total) by mouth daily. 90 tablet 3  . aspirin EC 81 MG tablet Take 1 tablet (81 mg total) by mouth daily. 90 tablet 3  . cetirizine (ZYRTEC) 10 MG tablet Take 10 mg by mouth daily as needed for allergies.     . furosemide (LASIX) 40 MG tablet Take 1 tablet (40 mg total) by mouth daily. 30 tablet 11  . mirtazapine (REMERON) 15 MG tablet Take 1 tablet (15 mg total) by mouth at bedtime. 90 tablet 3  . omeprazole (PRILOSEC) 20 MG capsule TAKE 1 CAPSULE EVERY DAY 90 capsule 3  .  omeprazole (PRILOSEC) 40 MG capsule Take 1 capsule (40 mg total) by mouth daily. 30 capsule 2  . polyethylene glycol (MIRALAX / GLYCOLAX) packet Take 17 g by mouth daily as needed for mild constipation.    . pravastatin (PRAVACHOL) 40 MG tablet TAKE 1 TABLET EVERY EVENING 90 tablet 3  . predniSONE (DELTASONE) 20 MG tablet Take 2 tablets (40 mg total) by mouth daily with breakfast. 90 tablet 1   No current facility-administered medications on file prior to visit.     Review of Systems  Constitutional: Negative for other unusual diaphoresis or sweats HENT:  Negative for ear discharge or swelling Eyes: Negative for other worsening visual disturbances Respiratory: Negative for stridor or other swelling  Gastrointestinal: Negative for worsening distension or other blood Genitourinary: Negative for retention or other urinary change Musculoskeletal: Negative for other MSK pain or swelling Skin: Negative for color change or other new lesions Neurological: Negative for worsening tremors and other numbness  Psychiatric/Behavioral: Negative for worsening agitation or other fatigue All other system neg per pt    Objective:   Physical Exam BP 120/72   Pulse 69   Temp 97.8 F (36.6 C) (Oral)   Ht 5\' 5"  (1.651 m)   Wt 163 lb (73.9 kg)   SpO2 96%   BMI 27.12 kg/m  VS noted, not ill appearing Constitutional: Pt appears in NAD HENT: Head: NCAT.  Right Ear: External ear normal.  Left Ear: External ear normal.  Eyes: . Pupils are equal, round, and reactive to light. Conjunctivae and EOM are normal Nose: without d/c or deformity Neck: Neck supple. Gross normal ROM Cardiovascular: Normal rate and regular rhythm.   Pulmonary/Chest: Effort normal and breath sounds without rales or wheezing.  Neurological: Pt is alert. At baseline orientation, motor grossly intact Skin: Skin is warm. No rashes, other new lesions, 1+ bilat LE edema to knees Psychiatric: Pt behavior is normal without agitation  No other exam findings  Lab Results  Component Value Date   WBC 24.2 Repeated and verified X2. (HH) 11/03/2017   HGB 12.6 (L) 11/03/2017   HCT 37.6 (L) 11/03/2017   PLT 337.0 11/03/2017   GLUCOSE 111 (H) 11/03/2017   CHOL 101 10/26/2017   TRIG 103 10/26/2017   HDL 37 (L) 10/26/2017   LDLCALC 43 10/26/2017   ALT 13 11/03/2017   AST 12 11/03/2017   NA 135 11/03/2017   K 4.2 11/03/2017   CL 96 11/03/2017   CREATININE 1.77 (H) 11/03/2017   BUN 44 (H) 11/03/2017   CO2 32 11/03/2017   TSH 2.15 10/18/2017   PSA 0.73 10/18/2017   INR 1.21 10/27/2017    HGBA1C 5.5 10/26/2017      Assessment & Plan:

## 2017-11-18 ENCOUNTER — Encounter: Payer: Self-pay | Admitting: Internal Medicine

## 2017-11-18 ENCOUNTER — Telehealth: Payer: Self-pay

## 2017-11-18 DIAGNOSIS — J449 Chronic obstructive pulmonary disease, unspecified: Secondary | ICD-10-CM | POA: Diagnosis not present

## 2017-11-18 DIAGNOSIS — H47013 Ischemic optic neuropathy, bilateral: Secondary | ICD-10-CM | POA: Diagnosis not present

## 2017-11-18 DIAGNOSIS — H543 Unqualified visual loss, both eyes: Secondary | ICD-10-CM | POA: Diagnosis not present

## 2017-11-18 DIAGNOSIS — N183 Chronic kidney disease, stage 3 (moderate): Secondary | ICD-10-CM | POA: Diagnosis not present

## 2017-11-18 DIAGNOSIS — M316 Other giant cell arteritis: Secondary | ICD-10-CM | POA: Diagnosis not present

## 2017-11-18 DIAGNOSIS — I129 Hypertensive chronic kidney disease with stage 1 through stage 4 chronic kidney disease, or unspecified chronic kidney disease: Secondary | ICD-10-CM | POA: Diagnosis not present

## 2017-11-18 LAB — BASIC METABOLIC PANEL
BUN: 47 mg/dL — AB (ref 6–23)
CALCIUM: 8.2 mg/dL — AB (ref 8.4–10.5)
CHLORIDE: 90 meq/L — AB (ref 96–112)
CO2: 29 meq/L (ref 19–32)
Creatinine, Ser: 1.67 mg/dL — ABNORMAL HIGH (ref 0.40–1.50)
GFR: 41.19 mL/min — ABNORMAL LOW (ref >60.00)
GLUCOSE: 149 mg/dL — AB (ref 70–99)
Potassium: 4.3 mEq/L (ref 3.5–5.1)
Sodium: 127 mEq/L — ABNORMAL LOW (ref 135–145)

## 2017-11-18 NOTE — Telephone Encounter (Signed)
Informed pt's wife and she will schedule an appt for Monday.

## 2017-11-18 NOTE — Telephone Encounter (Signed)
-----   Message from [name], MD sent at 11/18/2017 12:39 PM EDT ----- Letter sent, cont same tx except  The test results show that your current treatment is OK, except the Sodium level is moderately low at 127 (normal is 135 and higher), and most likely caused by the fluid pills started recently.  At your last visit the swelling still somewhat persisted, so I recommended to take the lasix twice per day for 3 days, then return to once per day.  Since the Sodium level is low, it appears that this will get worse with increased lasix.  Lower sodium can lead to confusion, weakness, and falls, even without dehydration.  At this time, we need to [place] lasix for 4 days, then we should see you back on Monday for a repeat exam and blood work.  You should be called from the office as well. We may be able to restart the lasix at the 40 mg daily, possibly for 4 days out of the week, but we'll need to consider that at the visit  [name] to please inform pt, needs ROV Monday mar 25

## 2017-11-20 ENCOUNTER — Encounter: Payer: Self-pay | Admitting: Internal Medicine

## 2017-11-20 NOTE — Assessment & Plan Note (Signed)
stable overall by history and exam, recent data reviewed with pt, and pt to continue medical treatment as before,  to f/u any worsening symptoms or concerns  

## 2017-11-20 NOTE — Assessment & Plan Note (Signed)
Symptomatically improved, to cont the prednisone, and f/u rheum as planned

## 2017-11-20 NOTE — Assessment & Plan Note (Signed)
Much improved, to increase lasix 40 bid for 3 days pending lab results today, then 40 qd after

## 2017-11-22 ENCOUNTER — Ambulatory Visit (INDEPENDENT_AMBULATORY_CARE_PROVIDER_SITE_OTHER): Payer: Medicare HMO | Admitting: Internal Medicine

## 2017-11-22 ENCOUNTER — Encounter: Payer: Self-pay | Admitting: Internal Medicine

## 2017-11-22 ENCOUNTER — Other Ambulatory Visit (INDEPENDENT_AMBULATORY_CARE_PROVIDER_SITE_OTHER): Payer: Medicare HMO

## 2017-11-22 ENCOUNTER — Other Ambulatory Visit: Payer: Self-pay | Admitting: Internal Medicine

## 2017-11-22 VITALS — BP 122/76 | HR 64 | Temp 97.6°F | Ht 65.0 in | Wt 172.0 lb

## 2017-11-22 DIAGNOSIS — J449 Chronic obstructive pulmonary disease, unspecified: Secondary | ICD-10-CM

## 2017-11-22 DIAGNOSIS — R609 Edema, unspecified: Secondary | ICD-10-CM | POA: Diagnosis not present

## 2017-11-22 DIAGNOSIS — M316 Other giant cell arteritis: Secondary | ICD-10-CM

## 2017-11-22 DIAGNOSIS — E871 Hypo-osmolality and hyponatremia: Secondary | ICD-10-CM | POA: Insufficient documentation

## 2017-11-22 LAB — CBC WITH DIFFERENTIAL/PLATELET
BASOS PCT: 0.2 % (ref 0.0–3.0)
Basophils Absolute: 0 10*3/uL (ref 0.0–0.1)
EOS PCT: 0 % (ref 0.0–5.0)
Eosinophils Absolute: 0 10*3/uL (ref 0.0–0.7)
HEMATOCRIT: 36.4 % — AB (ref 39.0–52.0)
Hemoglobin: 12.2 g/dL — ABNORMAL LOW (ref 13.0–17.0)
LYMPHS PCT: 9.5 % — AB (ref 12.0–46.0)
Lymphs Abs: 1.3 10*3/uL (ref 0.7–4.0)
MCHC: 33.5 g/dL (ref 30.0–36.0)
MCV: 94.1 fl (ref 78.0–100.0)
MONOS PCT: 8.4 % (ref 3.0–12.0)
Monocytes Absolute: 1.1 10*3/uL — ABNORMAL HIGH (ref 0.1–1.0)
Neutro Abs: 11.3 10*3/uL — ABNORMAL HIGH (ref 1.4–7.7)
Neutrophils Relative %: 81.9 % — ABNORMAL HIGH (ref 43.0–77.0)
Platelets: 222 10*3/uL (ref 150.0–400.0)
RBC: 3.87 Mil/uL — ABNORMAL LOW (ref 4.22–5.81)
RDW: 17.1 % — AB (ref 11.5–15.5)
WBC: 13.8 10*3/uL — ABNORMAL HIGH (ref 4.0–10.5)

## 2017-11-22 LAB — BASIC METABOLIC PANEL
BUN: 49 mg/dL — ABNORMAL HIGH (ref 6–23)
CHLORIDE: 91 meq/L — AB (ref 96–112)
CO2: 29 meq/L (ref 19–32)
Calcium: 7.9 mg/dL — ABNORMAL LOW (ref 8.4–10.5)
Creatinine, Ser: 1.56 mg/dL — ABNORMAL HIGH (ref 0.40–1.50)
GFR: 44.56 mL/min — ABNORMAL LOW (ref >60.00)
Glucose, Bld: 129 mg/dL — ABNORMAL HIGH (ref 70–99)
Potassium: 4.9 mEq/L (ref 3.5–5.1)
SODIUM: 124 meq/L — AB (ref 135–145)

## 2017-11-22 LAB — SEDIMENTATION RATE: Sed Rate: 11 mm/hr (ref 0–20)

## 2017-11-22 MED ORDER — FUROSEMIDE 40 MG PO TABS: ORAL_TABLET | ORAL | 11 refills | Status: DC

## 2017-11-22 NOTE — Assessment & Plan Note (Signed)
With worsening volume and leg edema with increased pain per pt; will try lasix at 4 days per wk, f/u next visit

## 2017-11-22 NOTE — Assessment & Plan Note (Signed)
Stable, to cont prednisone, f/u rheum as planned

## 2017-11-22 NOTE — Patient Instructions (Addendum)
OK to restart the lasix at 40 mg 4 days per wk (Mon, Wed, Fri and Saturday)  Please continue all other medications as before, and refills have been done if requested.  Please have the pharmacy call with any other refills you may need.  Please keep your appointments with your specialists as you may have planned  Please go to the LAB in the Basement (turn left off the elevator) for the tests to be done today  We will forward the results to Dr Amil Amen later today  You will be contacted by phone if any changes need to be made immediately.  Otherwise, you will receive a letter about your results with an explanation, but please check with MyChart first.  Please remember to sign up for MyChart if you have not done so, as this will be important to you in the future with finding out test results, communicating by private email, and scheduling acute appointments online when needed.  Please return in 1 month, or sooner if needed

## 2017-11-22 NOTE — Assessment & Plan Note (Signed)
stable overall by history and exam, recent data reviewed with pt, and pt to continue medical treatment as before,  to f/u any worsening symptoms or concerns  

## 2017-11-22 NOTE — Assessment & Plan Note (Signed)
Mild to mod, most likely due to increased diuretic recently, lasix held x 4 days, for f/u lab today

## 2017-11-22 NOTE — Progress Notes (Signed)
Subjective:    Patient ID: [name], male    DOB: 14-Sep-1924, 84 y.o.   MRN: [id number]  HPI  Here to f/u recent finding low sodium after increased lasix from 20 to 40 mg with wt loss and leg edema improved;  Last sodium 127 new onset; at last visit pt was asked to increase the lasix to 40 bid for 3 days then once daily after, but we were able to contact the pt and wife to say hold the lasix until today.  Pt denies chest pain, increased sob or doe, wheezing, orthopnea, PND, palpitations, dizziness or syncope, though has had increased LE edema and wt.   Wt Readings from Last 3 Encounters:  11/22/17 172 lb (78 kg)  11/17/17 163 lb (73.9 kg)  11/08/17 169 lb (76.7 kg)   Pt denies fever, wt loss, night sweats, loss of appetite, or other constitutional symptoms Good compliance with prednisone 20 bid, has f/u appt with rheum Dr Amil Amen 3/27.  No new complaints.  Still has complete right vision loss and no improvement in left vision.  Pt frustrated but still hoping to improve Past Medical History:  Diagnosis Date  . ALLERGIC RHINITIS 09/29/2007  . Aortic stenosis 02/05/2017  . BACK PAIN 01/31/2009  . BENIGN PROSTATIC HYPERTROPHY 05/12/2007  . CHEST PAIN 01/31/2008  . CHRONIC OBSTRUCTIVE PULMONARY DISEASE, ACUTE EXACERBATION 08/17/2007  . CONJUNCTIVITIS, ALLERGIC, CHRONIC 03/24/2010  . CONSTIPATION 02/19/2010  . COPD 05/12/2007  . GERD 02/23/2010  . GOUT 07/04/2010  . History of CVA (cerebrovascular accident) 05/03/2011  . HYPERLIPIDEMIA 05/12/2007  . HYPERTENSION 05/12/2007  . NEPHROLITHIASIS, HX OF 08/17/2007  . OBESITY 05/12/2007  . PEPTIC ULCER DISEASE 05/12/2007  . PERIPHERAL VASCULAR DISEASE 05/12/2007  . Personal history of unspecified circulatory disease 05/12/2007  . PLANTAR FASCIITIS, RIGHT 09/27/2008  . RENAL CELL CANCER 05/12/2007  . RENAL INSUFFICIENCY 08/17/2007  . SKIN LESION 11/07/2010  . VERTIGO 09/29/2007  . WRIST PAIN, RIGHT 02/26/2009   Past Surgical History:  Procedure Laterality  Date  . ARTERY BIOPSY Bilateral 10/27/2017   Procedure: BILATERAL TEMPORAL ARTERY BIOPSY;  Surgeon: Serafina Mitchell, MD;  Location: [facility] OR;  Service: Vascular;  Laterality: Bilateral;  . CATARACT EXTRACTION    . ear surgury    . NEPHRECTOMY    . NEPHROURETERECTOMY    . s.p left knee replacement  10/08  . s/p bilat fem pop bypass      reports that he has been smoking.  He has never used smokeless tobacco. He reports that he does not drink alcohol or use drugs. family history includes Alcohol abuse in his brother; Cancer in his daughter, mother, other, and other; Heart disease in his brother. Allergies  Allergen Reactions  . Sulfonamide Derivatives     REACTION: rash   Current Outpatient Medications on File Prior to Visit  Medication Sig Dispense Refill  . allopurinol (ZYLOPRIM) 100 MG tablet Take 1 tablet (100 mg total) by mouth daily. 90 tablet 3  . amLODipine (NORVASC) 5 MG tablet Take 1 tablet (5 mg total) by mouth daily. 90 tablet 3  . aspirin EC 81 MG tablet Take 1 tablet (81 mg total) by mouth daily. 90 tablet 3  . cetirizine (ZYRTEC) 10 MG tablet Take 10 mg by mouth daily as needed for allergies.     . mirtazapine (REMERON) 15 MG tablet Take 1 tablet (15 mg total) by mouth at bedtime. 90 tablet 3  . omeprazole (PRILOSEC) 20 MG capsule TAKE 1 CAPSULE EVERY  DAY 90 capsule 3  . omeprazole (PRILOSEC) 40 MG capsule Take 1 capsule (40 mg total) by mouth daily. 30 capsule 2  . polyethylene glycol (MIRALAX / GLYCOLAX) packet Take 17 g by mouth daily as needed for mild constipation.    . pravastatin (PRAVACHOL) 40 MG tablet TAKE 1 TABLET EVERY EVENING 90 tablet 3  . predniSONE (DELTASONE) 20 MG tablet Take 2 tablets (40 mg total) by mouth daily with breakfast. 90 tablet 1   No current facility-administered medications on file prior to visit.    Review of Systems All other system neg per pt    Objective:   Physical Exam BP 122/76   Pulse 64   Temp 97.6 F (36.4 C) (Oral)   Ht 5'  5" (1.651 m)   Wt 172 lb (78 kg)   SpO2 97%   BMI 28.62 kg/m  VS noted,  Constitutional: Pt appears in NAD HENT: Head: NCAT.  Right Ear: External ear normal.  Left Ear: External ear normal.  Eyes: . Pupils are equal, round, and reactive to light. Conjunctivae and EOM are normal Nose: without d/c or deformity Neck: Neck supple. Gross normal ROM Cardiovascular: Normal rate and regular rhythm.   Pulmonary/Chest: Effort normal and breath sounds without rales or wheezing.  Abd:  Soft, NT, ND, + BS, no organomegaly Neurological: Pt is alert. At baseline orientation, motor grossly intact Skin: Skin is warm. No rashes, other new lesions, no LE edema Psychiatric: Pt behavior is normal without agitation  No other exam findings    Assessment & Plan:

## 2017-11-23 ENCOUNTER — Other Ambulatory Visit: Payer: Self-pay | Admitting: Internal Medicine

## 2017-11-23 ENCOUNTER — Encounter: Payer: Self-pay | Admitting: Internal Medicine

## 2017-11-23 DIAGNOSIS — I129 Hypertensive chronic kidney disease with stage 1 through stage 4 chronic kidney disease, or unspecified chronic kidney disease: Secondary | ICD-10-CM | POA: Diagnosis not present

## 2017-11-23 DIAGNOSIS — N183 Chronic kidney disease, stage 3 (moderate): Secondary | ICD-10-CM | POA: Diagnosis not present

## 2017-11-23 DIAGNOSIS — M316 Other giant cell arteritis: Secondary | ICD-10-CM | POA: Diagnosis not present

## 2017-11-23 DIAGNOSIS — H47013 Ischemic optic neuropathy, bilateral: Secondary | ICD-10-CM | POA: Diagnosis not present

## 2017-11-23 DIAGNOSIS — E871 Hypo-osmolality and hyponatremia: Secondary | ICD-10-CM

## 2017-11-23 DIAGNOSIS — J449 Chronic obstructive pulmonary disease, unspecified: Secondary | ICD-10-CM | POA: Diagnosis not present

## 2017-11-23 DIAGNOSIS — H543 Unqualified visual loss, both eyes: Secondary | ICD-10-CM | POA: Diagnosis not present

## 2017-11-24 DIAGNOSIS — M316 Other giant cell arteritis: Secondary | ICD-10-CM | POA: Diagnosis not present

## 2017-11-24 DIAGNOSIS — Z6828 Body mass index (BMI) 28.0-28.9, adult: Secondary | ICD-10-CM | POA: Diagnosis not present

## 2017-11-24 DIAGNOSIS — E663 Overweight: Secondary | ICD-10-CM | POA: Diagnosis not present

## 2017-11-24 DIAGNOSIS — N183 Chronic kidney disease, stage 3 (moderate): Secondary | ICD-10-CM | POA: Diagnosis not present

## 2017-11-24 DIAGNOSIS — J449 Chronic obstructive pulmonary disease, unspecified: Secondary | ICD-10-CM | POA: Diagnosis not present

## 2017-11-26 ENCOUNTER — Emergency Department (HOSPITAL_COMMUNITY): Payer: Medicare HMO

## 2017-11-26 ENCOUNTER — Inpatient Hospital Stay (HOSPITAL_COMMUNITY)
Admission: EM | Admit: 2017-11-26 | Discharge: 2017-11-29 | DRG: 291 | Disposition: A | Discharge status: Home / Self Care | Payer: Medicare HMO | Attending: Family Medicine | Admitting: Family Medicine

## 2017-11-26 ENCOUNTER — Telehealth: Payer: Self-pay

## 2017-11-26 ENCOUNTER — Ambulatory Visit: Payer: Self-pay | Admitting: *Deleted

## 2017-11-26 ENCOUNTER — Encounter (HOSPITAL_COMMUNITY): Payer: Self-pay | Admitting: Emergency Medicine

## 2017-11-26 ENCOUNTER — Telehealth: Payer: Self-pay | Admitting: Internal Medicine

## 2017-11-26 ENCOUNTER — Other Ambulatory Visit (INDEPENDENT_AMBULATORY_CARE_PROVIDER_SITE_OTHER): Payer: Medicare HMO

## 2017-11-26 DIAGNOSIS — K219 Gastro-esophageal reflux disease without esophagitis: Secondary | ICD-10-CM | POA: Diagnosis present

## 2017-11-26 DIAGNOSIS — E861 Hypovolemia: Secondary | ICD-10-CM | POA: Diagnosis present

## 2017-11-26 DIAGNOSIS — I350 Nonrheumatic aortic (valve) stenosis: Secondary | ICD-10-CM | POA: Diagnosis present

## 2017-11-26 DIAGNOSIS — R0600 Dyspnea, unspecified: Secondary | ICD-10-CM | POA: Diagnosis not present

## 2017-11-26 DIAGNOSIS — J449 Chronic obstructive pulmonary disease, unspecified: Secondary | ICD-10-CM | POA: Diagnosis not present

## 2017-11-26 DIAGNOSIS — Z8711 Personal history of peptic ulcer disease: Secondary | ICD-10-CM

## 2017-11-26 DIAGNOSIS — M549 Dorsalgia, unspecified: Secondary | ICD-10-CM | POA: Diagnosis present

## 2017-11-26 DIAGNOSIS — E871 Hypo-osmolality and hyponatremia: Secondary | ICD-10-CM | POA: Diagnosis present

## 2017-11-26 DIAGNOSIS — E876 Hypokalemia: Secondary | ICD-10-CM | POA: Diagnosis present

## 2017-11-26 DIAGNOSIS — D631 Anemia in chronic kidney disease: Secondary | ICD-10-CM | POA: Diagnosis present

## 2017-11-26 DIAGNOSIS — E8779 Other fluid overload: Secondary | ICD-10-CM

## 2017-11-26 DIAGNOSIS — I130 Hypertensive heart and chronic kidney disease with heart failure and stage 1 through stage 4 chronic kidney disease, or unspecified chronic kidney disease: Secondary | ICD-10-CM | POA: Diagnosis not present

## 2017-11-26 DIAGNOSIS — R609 Edema, unspecified: Secondary | ICD-10-CM | POA: Diagnosis not present

## 2017-11-26 DIAGNOSIS — E8770 Fluid overload, unspecified: Secondary | ICD-10-CM | POA: Diagnosis not present

## 2017-11-26 DIAGNOSIS — Z882 Allergy status to sulfonamides status: Secondary | ICD-10-CM

## 2017-11-26 DIAGNOSIS — I10 Essential (primary) hypertension: Secondary | ICD-10-CM | POA: Diagnosis not present

## 2017-11-26 DIAGNOSIS — F17200 Nicotine dependence, unspecified, uncomplicated: Secondary | ICD-10-CM | POA: Diagnosis present

## 2017-11-26 DIAGNOSIS — Z96652 Presence of left artificial knee joint: Secondary | ICD-10-CM | POA: Diagnosis present

## 2017-11-26 DIAGNOSIS — K5900 Constipation, unspecified: Secondary | ICD-10-CM | POA: Diagnosis present

## 2017-11-26 DIAGNOSIS — Z95820 Peripheral vascular angioplasty status with implants and grafts: Secondary | ICD-10-CM

## 2017-11-26 DIAGNOSIS — I4510 Unspecified right bundle-branch block: Secondary | ICD-10-CM | POA: Diagnosis not present

## 2017-11-26 DIAGNOSIS — Z85528 Personal history of other malignant neoplasm of kidney: Secondary | ICD-10-CM

## 2017-11-26 DIAGNOSIS — T502X5A Adverse effect of carbonic-anhydrase inhibitors, benzothiadiazides and other diuretics, initial encounter: Secondary | ICD-10-CM | POA: Diagnosis present

## 2017-11-26 DIAGNOSIS — M109 Gout, unspecified: Secondary | ICD-10-CM | POA: Diagnosis present

## 2017-11-26 DIAGNOSIS — J42 Unspecified chronic bronchitis: Secondary | ICD-10-CM | POA: Diagnosis not present

## 2017-11-26 DIAGNOSIS — I5031 Acute diastolic (congestive) heart failure: Secondary | ICD-10-CM | POA: Diagnosis present

## 2017-11-26 DIAGNOSIS — N183 Chronic kidney disease, stage 3 unspecified: Secondary | ICD-10-CM | POA: Diagnosis present

## 2017-11-26 DIAGNOSIS — I493 Ventricular premature depolarization: Secondary | ICD-10-CM | POA: Diagnosis present

## 2017-11-26 DIAGNOSIS — M316 Other giant cell arteritis: Secondary | ICD-10-CM | POA: Diagnosis not present

## 2017-11-26 DIAGNOSIS — I739 Peripheral vascular disease, unspecified: Secondary | ICD-10-CM | POA: Diagnosis present

## 2017-11-26 DIAGNOSIS — D72829 Elevated white blood cell count, unspecified: Secondary | ICD-10-CM | POA: Diagnosis present

## 2017-11-26 DIAGNOSIS — Z8673 Personal history of transient ischemic attack (TIA), and cerebral infarction without residual deficits: Secondary | ICD-10-CM

## 2017-11-26 DIAGNOSIS — I129 Hypertensive chronic kidney disease with stage 1 through stage 4 chronic kidney disease, or unspecified chronic kidney disease: Secondary | ICD-10-CM | POA: Diagnosis not present

## 2017-11-26 DIAGNOSIS — R7989 Other specified abnormal findings of blood chemistry: Secondary | ICD-10-CM | POA: Diagnosis present

## 2017-11-26 DIAGNOSIS — R402414 Glasgow coma scale score 13-15, 24 hours or more after hospital admission: Secondary | ICD-10-CM | POA: Diagnosis present

## 2017-11-26 DIAGNOSIS — E785 Hyperlipidemia, unspecified: Secondary | ICD-10-CM | POA: Diagnosis present

## 2017-11-26 DIAGNOSIS — N400 Enlarged prostate without lower urinary tract symptoms: Secondary | ICD-10-CM | POA: Diagnosis present

## 2017-11-26 DIAGNOSIS — Z7952 Long term (current) use of systemic steroids: Secondary | ICD-10-CM

## 2017-11-26 DIAGNOSIS — Z7982 Long term (current) use of aspirin: Secondary | ICD-10-CM

## 2017-11-26 DIAGNOSIS — Z905 Acquired absence of kidney: Secondary | ICD-10-CM

## 2017-11-26 DIAGNOSIS — Z87442 Personal history of urinary calculi: Secondary | ICD-10-CM

## 2017-11-26 DIAGNOSIS — Z79899 Other long term (current) drug therapy: Secondary | ICD-10-CM

## 2017-11-26 LAB — COMPREHENSIVE METABOLIC PANEL
ALBUMIN: 2.7 g/dL — AB (ref 3.5–5.0)
ALT: 24 U/L (ref 17–63)
ANION GAP: 9 (ref 5–15)
AST: 20 U/L (ref 15–41)
Alkaline Phosphatase: 63 U/L (ref 38–126)
BUN: 40 mg/dL — ABNORMAL HIGH (ref 6–20)
CO2: 23 mmol/L (ref 22–32)
Calcium: 7.8 mg/dL — ABNORMAL LOW (ref 8.9–10.3)
Chloride: 88 mmol/L — ABNORMAL LOW (ref 101–111)
Creatinine, Ser: 1.63 mg/dL — ABNORMAL HIGH (ref 0.61–1.24)
GFR calc Af Amer: 41 mL/min — ABNORMAL LOW (ref >60)
GFR calc non Af Amer: 35 mL/min — ABNORMAL LOW (ref >60)
GLUCOSE: 135 mg/dL — AB (ref 65–99)
POTASSIUM: 5.5 mmol/L — AB (ref 3.5–5.1)
SODIUM: 120 mmol/L — AB (ref 135–145)
Total Bilirubin: 0.7 mg/dL (ref 0.3–1.2)
Total Protein: 5 g/dL — ABNORMAL LOW (ref 6.5–8.1)

## 2017-11-26 LAB — CBC
HCT: 34.7 % — ABNORMAL LOW (ref 39.0–52.0)
HEMOGLOBIN: 12.4 g/dL — AB (ref 13.0–17.0)
MCH: 32.1 pg (ref 26.0–34.0)
MCHC: 35.7 g/dL (ref 30.0–36.0)
MCV: 89.9 fL (ref 78.0–100.0)
Platelets: 222 10*3/uL (ref 150–400)
RBC: 3.86 MIL/uL — ABNORMAL LOW (ref 4.22–5.81)
RDW: 16 % — ABNORMAL HIGH (ref 11.5–15.5)
WBC: 11.4 10*3/uL — ABNORMAL HIGH (ref 4.0–10.5)

## 2017-11-26 LAB — BASIC METABOLIC PANEL
BUN: 42 mg/dL — ABNORMAL HIGH (ref 6–23)
CALCIUM: 7.7 mg/dL — AB (ref 8.4–10.5)
CHLORIDE: 87 meq/L — AB (ref 96–112)
CO2: 28 meq/L (ref 19–32)
Creatinine, Ser: 1.47 mg/dL (ref 0.40–1.50)
GFR: 47.72 mL/min — ABNORMAL LOW (ref >60.00)
GLUCOSE: 108 mg/dL — AB (ref 70–99)
Potassium: 5.4 mEq/L — ABNORMAL HIGH (ref 3.5–5.1)
SODIUM: 120 meq/L — AB (ref 135–145)

## 2017-11-26 MED ORDER — TORSEMIDE 20 MG PO TABS: 20.0000 mg | ORAL_TABLET | Freq: Every day | ORAL | 11 refills | Status: DC

## 2017-11-26 MED ORDER — ACETAMINOPHEN 325 MG PO TABS
650.0000 mg | ORAL_TABLET | ORAL | Status: DC | PRN
  Administered 2017-11-27 (×2): 650 mg via ORAL
  Filled 2017-11-26 (×2): qty 2

## 2017-11-26 MED ORDER — AMLODIPINE BESYLATE 5 MG PO TABS
5.0000 mg | ORAL_TABLET | Freq: Every day | ORAL | Status: DC
  Administered 2017-11-27 – 2017-11-29 (×3): 5 mg via ORAL
  Filled 2017-11-26 (×3): qty 1

## 2017-11-26 MED ORDER — ONDANSETRON HCL 4 MG/2ML IJ SOLN
4.0000 mg | Freq: Four times a day (QID) | INTRAMUSCULAR | Status: DC | PRN
  Administered 2017-11-27: 4 mg via INTRAVENOUS
  Filled 2017-11-26: qty 2

## 2017-11-26 MED ORDER — SODIUM CHLORIDE 3 % IV SOLN
INTRAVENOUS | Status: DC
  Filled 2017-11-26: qty 500

## 2017-11-26 MED ORDER — ALLOPURINOL 100 MG PO TABS
100.0000 mg | ORAL_TABLET | Freq: Every day | ORAL | Status: DC
  Administered 2017-11-27 – 2017-11-29 (×3): 100 mg via ORAL
  Filled 2017-11-26 (×3): qty 1

## 2017-11-26 MED ORDER — PANTOPRAZOLE SODIUM 40 MG PO TBEC
40.0000 mg | DELAYED_RELEASE_TABLET | Freq: Every day | ORAL | Status: DC
  Administered 2017-11-27 – 2017-11-29 (×4): 40 mg via ORAL
  Filled 2017-11-26 (×4): qty 1

## 2017-11-26 MED ORDER — ALBUTEROL SULFATE (2.5 MG/3ML) 0.083% IN NEBU: 2.5000 mg | INHALATION_SOLUTION | RESPIRATORY_TRACT | Status: DC | PRN

## 2017-11-26 MED ORDER — ASPIRIN EC 81 MG PO TBEC
81.0000 mg | DELAYED_RELEASE_TABLET | Freq: Every day | ORAL | Status: DC
  Administered 2017-11-27 – 2017-11-29 (×3): 81 mg via ORAL
  Filled 2017-11-26 (×3): qty 1

## 2017-11-26 MED ORDER — MIRTAZAPINE 15 MG PO TABS
15.0000 mg | ORAL_TABLET | Freq: Every day | ORAL | Status: DC
  Administered 2017-11-27 – 2017-11-28 (×3): 15 mg via ORAL
  Filled 2017-11-26 (×4): qty 1

## 2017-11-26 MED ORDER — PREDNISONE 20 MG PO TABS
20.0000 mg | ORAL_TABLET | Freq: Every day | ORAL | Status: DC
  Administered 2017-11-27 – 2017-11-29 (×3): 20 mg via ORAL
  Filled 2017-11-26 (×3): qty 1

## 2017-11-26 MED ORDER — SODIUM CHLORIDE 0.9 % IV SOLN: 250.0000 mL | INTRAVENOUS | Status: DC | PRN

## 2017-11-26 MED ORDER — SODIUM CHLORIDE 0.9% FLUSH: 3.0000 mL | INTRAVENOUS | Status: DC | PRN

## 2017-11-26 MED ORDER — FUROSEMIDE 10 MG/ML IJ SOLN
40.0000 mg | Freq: Two times a day (BID) | INTRAMUSCULAR | Status: DC
  Administered 2017-11-27: 40 mg via INTRAVENOUS

## 2017-11-26 MED ORDER — HEPARIN SODIUM (PORCINE) 5000 UNIT/ML IJ SOLN
5000.0000 [IU] | Freq: Three times a day (TID) | INTRAMUSCULAR | Status: DC
  Administered 2017-11-27 – 2017-11-29 (×6): 5000 [IU] via SUBCUTANEOUS
  Filled 2017-11-26 (×6): qty 1

## 2017-11-26 MED ORDER — SODIUM CHLORIDE 0.9% FLUSH
3.0000 mL | Freq: Two times a day (BID) | INTRAVENOUS | Status: DC
  Administered 2017-11-27: 03:00:00 via INTRAVENOUS
  Administered 2017-11-27 – 2017-11-29 (×5): 3 mL via INTRAVENOUS

## 2017-11-26 MED ORDER — PRAVASTATIN SODIUM 40 MG PO TABS
40.0000 mg | ORAL_TABLET | Freq: Every evening | ORAL | Status: DC
  Administered 2017-11-27 – 2017-11-28 (×3): 40 mg via ORAL
  Filled 2017-11-26 (×3): qty 1

## 2017-11-26 NOTE — H&P (Signed)
History and Physical    DIVANTE KOTCH EUM:[id number] DOB: 04/17/1925 DOA: 11/26/2017  PCP: [name], MD   Patient coming from: Home  Chief Complaint: Leg swelling, lab abnormality  HPI: [name] is a 84 y.o. male with medical history significant for hypertension, COPD, and recent diagnosis of giant cell arteritis started on prednisone and Actemra, now presenting to the emergency department at the direction of his PCP for evaluation of hypovolemia and hyponatremia.  The patient has been suffering from progressive peripheral edema over the past month or more, managed by PCP with diuretics, but complicated by progressively worsening hyponatremia.  He recently stopped the diuretic due to hyponatremia, but his bilateral lower extremities have developed worsening, marked edema, now with serous weeping bilaterally.  He had blood work today, sodium was noted to be 120, and he was directed to the ED.  He denies headaches, denies chest pain, and denies shortness of breath while at rest, but notes dyspnea with even minimal activity.  ED Course: Upon arrival to the ED, patient is found to be afebrile, saturating adequately on room air, and with vitals otherwise normal.  EKG features a sinus rhythm with PVCs and RBBB.  Chest x-ray is notable for chronic bronchitic and interstitial changes, but no definite superimposed acute infiltrate.  Chemistry panel features a sodium of 120, potassium 5.5, BUN 40, and creatinine 1.63.  CBC is notable for a mild leukocytosis to 11,400 and a stable normocytic anemia with hemoglobin 12.4.  Patient remains hemodynamically stable, but becomes dyspneic with slight activity, and will be admitted to the telemetry unit for ongoing evaluation and management of severe hyponatremia with hypervolemia.  Review of Systems:  All other systems reviewed and apart from HPI, are negative.  Past Medical History:  Diagnosis Date  . ALLERGIC RHINITIS 09/29/2007  . Aortic stenosis  02/05/2017  . BACK PAIN 01/31/2009  . BENIGN PROSTATIC HYPERTROPHY 05/12/2007  . CHEST PAIN 01/31/2008  . CHRONIC OBSTRUCTIVE PULMONARY DISEASE, ACUTE EXACERBATION 08/17/2007  . CONJUNCTIVITIS, ALLERGIC, CHRONIC 03/24/2010  . CONSTIPATION 02/19/2010  . COPD 05/12/2007  . GERD 02/23/2010  . GOUT 07/04/2010  . History of CVA (cerebrovascular accident) 05/03/2011  . HYPERLIPIDEMIA 05/12/2007  . HYPERTENSION 05/12/2007  . NEPHROLITHIASIS, HX OF 08/17/2007  . OBESITY 05/12/2007  . PEPTIC ULCER DISEASE 05/12/2007  . PERIPHERAL VASCULAR DISEASE 05/12/2007  . Personal history of unspecified circulatory disease 05/12/2007  . PLANTAR FASCIITIS, RIGHT 09/27/2008  . RENAL CELL CANCER 05/12/2007  . RENAL INSUFFICIENCY 08/17/2007  . SKIN LESION 11/07/2010  . VERTIGO 09/29/2007  . WRIST PAIN, RIGHT 02/26/2009    Past Surgical History:  Procedure Laterality Date  . ARTERY BIOPSY Bilateral 10/27/2017   Procedure: BILATERAL TEMPORAL ARTERY BIOPSY;  Surgeon: Serafina Mitchell, MD;  Location: [facility] OR;  Service: Vascular;  Laterality: Bilateral;  . CATARACT EXTRACTION    . ear surgury    . NEPHRECTOMY    . NEPHROURETERECTOMY    . s.p left knee replacement  10/08  . s/p bilat fem pop bypass       reports that he has been smoking.  He has never used smokeless tobacco. He reports that he does not drink alcohol or use drugs.  Allergies  Allergen Reactions  . Sulfonamide Derivatives Rash    Family History  Problem Relation Age of Onset  . Cancer Mother        colon cancer  . Heart disease Brother   . Alcohol abuse Brother   . Cancer Daughter  bladder cancer  . Cancer Other        grandson leukemia and BMT  . Cancer Other        sibling with pancreatic cancer     Prior to Admission medications   Medication Sig Start Date End Date Taking? Authorizing Provider  ACTEMRA 162 MG/0.9ML SOSY Inject 0.9 mLs into the skin once. AS DIRECTED 11/22/17  Yes [provider]  allopurinol (ZYLOPRIM) 100 MG  tablet Take 1 tablet (100 mg total) by mouth daily. 01/14/17  Yes [name], MD  amLODipine (NORVASC) 5 MG tablet Take 1 tablet (5 mg total) by mouth daily. 01/06/17  Yes [name], MD  aspirin EC 81 MG tablet Take 1 tablet (81 mg total) by mouth daily. 02/16/17  Yes Camnitz, Will Hassell Done, MD  cetirizine (ZYRTEC) 10 MG tablet Take 10 mg by mouth daily.    Yes [provider]  mirtazapine (REMERON) 15 MG tablet Take 1 tablet (15 mg total) by mouth at bedtime. 10/18/17  Yes [name], MD  omeprazole (PRILOSEC) 20 MG capsule TAKE 1 CAPSULE EVERY DAY Patient taking differently: Take 20 mg by mouth once a day 11/15/17  Yes [name], MD  polyethylene glycol [facility] / Floria Raveling) packet Take 17 g by mouth daily as needed for mild constipation.   Yes [provider]  pravastatin (PRAVACHOL) 40 MG tablet TAKE 1 TABLET EVERY EVENING Patient taking differently: Take 40 mg by mouth in the evening 11/17/17  Yes [name], MD  predniSONE (DELTASONE) 10 MG tablet Take 10-15 mg by mouth See admin instructions. Take 15 mg by mouth in the evening for 14 days then 10 mg by mouth in the evening for 14 days, then re-visit MD 11/24/17  Yes [provider]  predniSONE (DELTASONE) 20 MG tablet Take 2 tablets (40 mg total) by mouth daily with breakfast. Patient taking differently: Take 20 mg by mouth See admin instructions. Take 20 mg by mouth in the morning for 28 days, then re-visit MD 11/08/17 12/08/17 Yes [name], MD  furosemide (LASIX) 40 MG tablet 1 tab by mouth on Mon/Wed/Fri/Sat only Patient taking differently: Take 40 mg by mouth See admin instructions. Take 40 mg by mouth on Mon/Wed/Fri/Sat only 11/22/17   [name], MD  hydrochlorothiazide (HYDRODIURIL) 25 MG tablet TAKE 1 TABLET EVERY DAY Patient not taking: Reported on 11/26/2017 11/23/17   [name], MD  omeprazole (PRILOSEC) 40 MG capsule Take 1 capsule (40 mg total) by mouth daily. Patient not taking: Reported  on 11/26/2017 10/28/17 11/27/17  Reyne Dumas, MD  torsemide (DEMADEX) 20 MG tablet Take 1 tablet (20 mg total) by mouth daily. 11/26/17   [name], MD    Physical Exam: Vitals:   11/26/17 1526 11/26/17 2245  BP: (!) 151/65 (!) 139/98  Pulse: 71 76  Resp: 18 19  Temp: 97.6 F (36.4 C)   TempSrc: Oral   SpO2: 98% 97%      Constitutional: NAD, calm  Eyes: PERTLA, lids and conjunctivae normal ENMT: Mucous membranes are moist. Posterior pharynx clear of any exudate or lesions.   Neck: normal, supple, no masses, no thyromegaly Respiratory: Coarse rales bilaterally. Normal respiratory effort. No accessory muscle use.  Cardiovascular: S1 & S2 heard, regular rate and rhythm. Marked bilateral leg edema with serous weeping. Abdomen: No distension, no tenderness, soft. Bowel sounds normal.  Musculoskeletal: no clubbing / cyanosis. No joint deformity upper and lower extremities.  Skin: no significant rashes, lesions, ulcers. Warm, dry, well-perfused. Neurologic: No facial asymmetry. Gross hearing deficit. Sensation intact. Moving all extemities.  Psychiatric: Somnolent, easily roused and oriented to person, place, and situation. Pleasant and cooperative.     Labs on Admission: I have personally reviewed following labs and imaging studies  CBC: Recent Labs  Lab 11/22/17 1148 11/26/17 1532  WBC 13.8* 11.4*  NEUTROABS 11.3*  --   HGB 12.2* 12.4*  HCT 36.4* 34.7*  MCV 94.1 89.9  PLT 222.0 818   Basic Metabolic Panel: Recent Labs  Lab 11/22/17 1148 11/26/17 1114 11/26/17 1532  NA 124* 120* 120*  K 4.9 5.4* 5.5*  CL 91* 87* 88*  CO2 29 28 23   GLUCOSE 129* 108* 135*  BUN 49* 42* 40*  CREATININE 1.56* 1.47 1.63*  CALCIUM 7.9* 7.7* 7.8*   GFR: Estimated Creatinine Clearance: 28.4 mL/min (A) (by C-G formula based on SCr of 1.63 mg/dL (H)). Liver Function Tests: Recent Labs  Lab 11/26/17 1532  AST 20  ALT 24  ALKPHOS 63  BILITOT 0.7  PROT 5.0*  ALBUMIN 2.7*   No  results for input(s): LIPASE, AMYLASE in the last 168 hours. No results for input(s): AMMONIA in the last 168 hours. Coagulation Profile: No results for input(s): INR, PROTIME in the last 168 hours. Cardiac Enzymes: No results for input(s): CKTOTAL, CKMB, CKMBINDEX, TROPONINI in the last 168 hours. BNP (last 3 results) No results for input(s): PROBNP in the last 8760 hours. HbA1C: No results for input(s): HGBA1C in the last 72 hours. CBG: No results for input(s): GLUCAP in the last 168 hours. Lipid Profile: No results for input(s): CHOL, HDL, LDLCALC, TRIG, CHOLHDL, LDLDIRECT in the last 72 hours. Thyroid Function Tests: No results for input(s): TSH, T4TOTAL, FREET4, T3FREE, THYROIDAB in the last 72 hours. Anemia Panel: No results for input(s): VITAMINB12, FOLATE, FERRITIN, TIBC, IRON, RETICCTPCT in the last 72 hours. Urine analysis:    Component Value Date/Time   COLORURINE YELLOW 10/18/2017 1624   APPEARANCEUR CLEAR 10/18/2017 1624   LABSPEC 1.020 10/18/2017 1624   PHURINE 6.0 10/18/2017 1624   GLUCOSEU NEGATIVE 10/18/2017 1624   HGBUR NEGATIVE 10/18/2017 1624   BILIRUBINUR NEGATIVE 10/18/2017 1624   KETONESUR NEGATIVE 10/18/2017 1624   PROTEINUR NEGATIVE 10/18/2007 1545   UROBILINOGEN 0.2 10/18/2017 1624   NITRITE NEGATIVE 10/18/2017 1624   LEUKOCYTESUR NEGATIVE 10/18/2017 1624   Sepsis Labs: @LABRCNTIP (procalcitonin:4,lacticidven:4) )No results found for this or any previous visit (from the past 240 hour(s)).   Radiological Exams on Admission: Dg Chest 2 View  Result Date: 11/26/2017 CLINICAL DATA:  Hyponatremia, swelling EXAM: CHEST - 2 VIEW COMPARISON:  10/18/2017 FINDINGS: Normal heart size and pulmonary vascularity. Atherosclerotic calcification and tortuosity of thoracic aorta. Chronic peribronchial thickening and accentuation of perihilar markings. Basilar interstitial changes bilaterally, grossly stable. Upper lungs clear. No pleural effusion or pneumothorax. Bones  demineralized.  Recall stable IMPRESSION: Chronic bronchitic and interstitial changes without definite superimposed acute infiltrate. Electronically Signed   By: Lavonia Dana M.D.   On: 11/26/2017 16:42    EKG: Independently reviewed. Sinus rhythm, PVC's, RBBB.   Assessment/Plan  1. Hypervolemic hyponatremia  - Presents at direction of PCP for marked peripheral edema with worsening hyponatremia, now severe  - He has been experiencing progressive edema, attributed to prednisone use, no CHF on echo last June, managed with Lasix outpatient, but complicated by progressive hyponatremia  - HCTZ had already been stopped  - Check TSH, update echocardiogram  - Diurese with Lasix 40  mg IV q12h, start 3% saline at 15 cc/hr with serial chem panels and adjust prn    2. COPD  - No dyspnea, but wheezes noted on admission  - Continue prn albuterol nebs    3. CKD stage III  - SCr is 1.63 on admission, similar to priors   - Renally-dose medications, following serial chem panels as above   4. GCA  - Managed with prednisone and Actemra  - Continue rheumatology follow-up  5. Hypertension  - BP at goal  - Continue Norvasc   DVT prophylaxis: sq heparin  Code Status: Full  Family Communication: Son and wife updated at bedside Consults called: None Admission status: Inpatient    Vianne Bulls, MD [facility] Pager [phone]  If 7PM-7AM, please contact night-coverage [email or web address] Password [facility]  11/26/2017, 11:56 PM

## 2017-11-26 NOTE — Telephone Encounter (Signed)
-----   Message from [name], MD sent at 11/26/2017 12:35 PM EDT ----- See phone note- pt to go to ED now

## 2017-11-26 NOTE — ED Notes (Signed)
Admitting at bedside 

## 2017-11-26 NOTE — Telephone Encounter (Signed)
Per PCP pt to start torsemide 20 qd, ROV next wk  Informed pt's wife here in the office

## 2017-11-26 NOTE — Telephone Encounter (Signed)
Pt's son has been informed.

## 2017-11-26 NOTE — Telephone Encounter (Signed)
Called son, [name], informed him of the results. He expressed understanding and will take patient to the ED now and hold of on med sent in today.

## 2017-11-26 NOTE — ED Triage Notes (Signed)
Pt here from MD office sent over for a low sodium of 120 , sodium has been dropping for about 3 weeks , bil  Lower legs swollen

## 2017-11-26 NOTE — ED Provider Notes (Signed)
[facility] EMERGENCY DEPARTMENT Provider Note   CSN: [id number] Arrival date & time: 11/26/17  1413     History   Chief Complaint Chief Complaint  Patient presents with  . Abnormal Lab    HPI [name] is a 84 y.o. male.  The history is provided by the patient and medical records.  Abnormal Lab    84 year old male with history of aortic stenosis, COPD, GERD, gout, history of stroke, hyperlipidemia, hypertension, peripheral vascular disease, renal cell cancer, vertigo, presenting to the ED with abnormal labs.  Patient reports over the past week he has had multiple labs drawn and his sodium continues decreasing.  His doctor felt like it may have been due to his diuretics so had him hold his Lasix for 4 days and repeated labs today, however sodium today was 120.  Patient has had significant increase in peripheral edema since stopping his Lasix, legs have actually started to weep clear fluid.  He denies any chest pain or shortness of breath.  He has not had any tremors or seizure activity.  He has not had any confusion, weakness, frequent falls, etc.  Past Medical History:  Diagnosis Date  . ALLERGIC RHINITIS 09/29/2007  . Aortic stenosis 02/05/2017  . BACK PAIN 01/31/2009  . BENIGN PROSTATIC HYPERTROPHY 05/12/2007  . CHEST PAIN 01/31/2008  . CHRONIC OBSTRUCTIVE PULMONARY DISEASE, ACUTE EXACERBATION 08/17/2007  . CONJUNCTIVITIS, ALLERGIC, CHRONIC 03/24/2010  . CONSTIPATION 02/19/2010  . COPD 05/12/2007  . GERD 02/23/2010  . GOUT 07/04/2010  . History of CVA (cerebrovascular accident) 05/03/2011  . HYPERLIPIDEMIA 05/12/2007  . HYPERTENSION 05/12/2007  . NEPHROLITHIASIS, HX OF 08/17/2007  . OBESITY 05/12/2007  . PEPTIC ULCER DISEASE 05/12/2007  . PERIPHERAL VASCULAR DISEASE 05/12/2007  . Personal history of unspecified circulatory disease 05/12/2007  . PLANTAR FASCIITIS, RIGHT 09/27/2008  . RENAL CELL CANCER 05/12/2007  . RENAL INSUFFICIENCY 08/17/2007  . SKIN LESION  11/07/2010  . VERTIGO 09/29/2007  . WRIST PAIN, RIGHT 02/26/2009    Patient Active Problem List   Diagnosis Date Noted  . Hyponatremia 11/22/2017  . Peripheral edema 11/08/2017  . Thrush 11/08/2017  . Temporal arteritis ([place]) 11/03/2017  . Vision loss, bilateral 10/26/2017  . Weight loss 10/18/2017  . Flank pain 10/18/2017  . Right arm pain 10/18/2017  . Depression 10/18/2017  . Aortic stenosis 02/05/2017  . Heart murmur 01/05/2017  . Skin lesion 01/04/2016  . Low back pain 12/21/2013  . Lumbar radiculitis 12/28/2012  . Smoker unmotivated to quit 11/14/2012  . Cough 11/06/2011  . Leukocytosis 11/06/2011  . History of CVA (cerebrovascular accident) 05/03/2011  . Preventative health care 05/03/2011  . SKIN LESION 11/07/2010  . GOUT 07/04/2010  . CONJUNCTIVITIS, ALLERGIC, CHRONIC 03/24/2010  . GERD 02/23/2010  . Constipation 02/19/2010  . WRIST PAIN, RIGHT 02/26/2009  . BACK PAIN 01/31/2009  . PLANTAR FASCIITIS, RIGHT 09/27/2008  . CHEST PAIN 01/31/2008  . ALLERGIC RHINITIS 09/29/2007  . VERTIGO 09/29/2007  . CKD (chronic kidney disease), stage III ([place]) 08/17/2007  . NEPHROLITHIASIS, HX OF 08/17/2007  . Malignant neoplasm of kidney excluding renal pelvis ([place]) 05/12/2007  . Hyperlipidemia 05/12/2007  . OBESITY 05/12/2007  . Essential hypertension 05/12/2007  . PERIPHERAL VASCULAR DISEASE 05/12/2007  . COPD (chronic obstructive pulmonary disease) ([place]) 05/12/2007  . PEPTIC ULCER DISEASE 05/12/2007  . BENIGN PROSTATIC HYPERTROPHY 05/12/2007    Past Surgical History:  Procedure Laterality Date  . ARTERY BIOPSY Bilateral 10/27/2017   Procedure: BILATERAL TEMPORAL ARTERY BIOPSY;  Surgeon: Trula Slade,  Butch Penny, MD;  Location: [place];  Service: Vascular;  Laterality: Bilateral;  . CATARACT EXTRACTION    . ear surgury    . NEPHRECTOMY    . NEPHROURETERECTOMY    . s.p left knee replacement  10/08  . s/p bilat fem pop bypass          Home Medications    Prior to Admission  medications   Medication Sig Start Date End Date Taking? Authorizing Provider  ACTEMRA 162 MG/0.9ML SOSY Inject 0.9 mLs into the skin once. AS DIRECTED 11/22/17  Yes [provider]  allopurinol (ZYLOPRIM) 100 MG tablet Take 1 tablet (100 mg total) by mouth daily. 01/14/17  Yes [name], MD  amLODipine (NORVASC) 5 MG tablet Take 1 tablet (5 mg total) by mouth daily. 01/06/17  Yes [name], MD  aspirin EC 81 MG tablet Take 1 tablet (81 mg total) by mouth daily. 02/16/17  Yes Camnitz, Will Hassell Done, MD  cetirizine (ZYRTEC) 10 MG tablet Take 10 mg by mouth daily.    Yes [provider]  mirtazapine (REMERON) 15 MG tablet Take 1 tablet (15 mg total) by mouth at bedtime. 10/18/17  Yes [name], MD  polyethylene glycol [facility] / Floria Raveling) packet Take 17 g by mouth daily as needed for mild constipation.   Yes [provider]  pravastatin (PRAVACHOL) 40 MG tablet TAKE 1 TABLET EVERY EVENING Patient taking differently: Take 40 mg by mouth in the evening 11/17/17  Yes [name], MD  furosemide (LASIX) 40 MG tablet 1 tab by mouth on Mon/Wed/Fri/Sat only Patient taking differently: Take 40 mg by mouth See admin instructions. Take 40 mg by mouth on Mon/Wed/Fri/Sat only 11/22/17   [name], MD  hydrochlorothiazide (HYDRODIURIL) 25 MG tablet TAKE 1 TABLET EVERY DAY Patient not taking: Reported on 11/26/2017 11/23/17   [name], MD  omeprazole (PRILOSEC) 20 MG capsule TAKE 1 CAPSULE EVERY DAY Patient taking differently: Take 20 mg by mouth once a day 11/15/17   [name], MD  omeprazole (PRILOSEC) 40 MG capsule Take 1 capsule (40 mg total) by mouth daily. 10/28/17 11/27/17  Reyne Dumas, MD  predniSONE (DELTASONE) 10 MG tablet Tad 11/24/17   [provider]  predniSONE (DELTASONE) 20 MG tablet Take 2 tablets (40 mg total) by mouth daily with breakfast. 11/08/17 12/08/17  [name], MD  torsemide (DEMADEX) 20 MG tablet Take 1 tablet (20 mg total) by mouth  daily. 11/26/17   [name], MD    Family History Family History  Problem Relation Age of Onset  . Cancer Mother        colon cancer  . Heart disease Brother   . Alcohol abuse Brother   . Cancer Daughter        bladder cancer  . Cancer Other        grandson leukemia and BMT  . Cancer Other        sibling with pancreatic cancer    Social History Social History   Tobacco Use  . Smoking status: Current Every Day Smoker  . Smokeless tobacco: Never Used  Substance Use Topics  . Alcohol use: No  . Drug use: No     Allergies   Sulfonamide derivatives   Review of Systems Review of Systems  Constitutional:       Abnormal labs  All other systems reviewed and are negative.    Physical Exam Updated Vital Signs BP (!) 139/98  Pulse 76   Temp 97.6 F (36.4 C) (Oral)   Resp 19   SpO2 97%   Physical Exam  Constitutional: He is oriented to person, place, and time. He appears well-developed and well-nourished.  HENT:  Head: Normocephalic and atraumatic.  Mouth/Throat: Oropharynx is clear and moist.  Eyes: Pupils are equal, round, and reactive to light. Conjunctivae and EOM are normal.  Neck: Normal range of motion.  Cardiovascular: Normal rate, regular rhythm and normal heart sounds.  Pulmonary/Chest: Effort normal. He has wheezes.  Mild expiratory wheezes (family states chronic), no distress, O2 sats 100% during exam, able to speak in full sentences without issue  Abdominal: Soft. Bowel sounds are normal.  Musculoskeletal: Normal range of motion.  3+ pitting edema of the lower legs, weeping clear fluid; no overlying skin discoloration or warmth to touch  Neurological: He is alert and oriented to person, place, and time.  Skin: Skin is warm and dry.  Psychiatric: He has a normal mood and affect.  Nursing note and vitals reviewed.    ED Treatments / Results  Labs (all labs ordered are listed, but only abnormal results are displayed) Labs Reviewed  CBC -  Abnormal; Notable for the following components:      Result Value   WBC 11.4 (*)    RBC 3.86 (*)    Hemoglobin 12.4 (*)    HCT 34.7 (*)    RDW 16.0 (*)    All other components within normal limits  COMPREHENSIVE METABOLIC PANEL - Abnormal; Notable for the following components:   Sodium 120 (*)    Potassium 5.5 (*)    Chloride 88 (*)    Glucose, Bld 135 (*)    BUN 40 (*)    Creatinine, Ser 1.63 (*)    Calcium 7.8 (*)    Total Protein 5.0 (*)    Albumin 2.7 (*)    GFR calc non Af Amer 35 (*)    GFR calc Af Amer 41 (*)    All other components within normal limits    EKG EKG Interpretation  Date/Time:  Friday November 26 2017 15:29:39 EDT Ventricular Rate:  75 PR Interval:  208 QRS Duration: 136 QT Interval:  408 QTC Calculation: 455 R Axis:   86 Text Interpretation:  Sinus rhythm with occasional Premature ventricular complexes Right bundle branch block Abnormal ECG Confirmed by Tanna Furry [phone]) on 11/26/2017 3:34:11 PM   Radiology Dg Chest 2 View  Result Date: 11/26/2017 CLINICAL DATA:  Hyponatremia, swelling EXAM: CHEST - 2 VIEW COMPARISON:  10/18/2017 FINDINGS: Normal heart size and pulmonary vascularity. Atherosclerotic calcification and tortuosity of thoracic aorta. Chronic peribronchial thickening and accentuation of perihilar markings. Basilar interstitial changes bilaterally, grossly stable. Upper lungs clear. No pleural effusion or pneumothorax. Bones demineralized.  Recall stable IMPRESSION: Chronic bronchitic and interstitial changes without definite superimposed acute infiltrate. Electronically Signed   By: Lavonia Dana M.D.   On: 11/26/2017 16:42    Procedures Procedures (including critical care time)  CRITICAL CARE Performed by: Larene Pickett   Total critical care time: 35 minutes  Critical care time was exclusive of separately billable procedures and treating other patients.  Critical care was necessary to treat or prevent imminent or life-threatening  deterioration.  Critical care was time spent personally by me on the following activities: development of treatment plan with patient and/or surrogate as well as nursing, discussions with consultants, evaluation of patient's response to treatment, examination of patient, obtaining history from patient or surrogate, ordering and  performing treatments and interventions, ordering and review of laboratory studies, ordering and review of radiographic studies, pulse oximetry and re-evaluation of patient's condition.   Medications Ordered in ED Medications - No data to display   Initial Impression / Assessment and Plan / ED Course  I have reviewed the triage vital signs and the nursing notes.  Pertinent labs & imaging results that were available during my care of the patient were reviewed by me and considered in my medical decision making (see chart for details).  84 year old male sent in from PCP with abnormal labs.  He is sodium has been low over the past 9 days.  Lasix were held for 5 days with the assumption that this was the etiology, however today's sodium is even lower at 120.  Patient now with significant peripheral edema and clear fluid weeping from his legs.  They do not appear cellulitic.  He denies any shortness of breath and vital signs are stable.  He has not had any increased confusion, falls, tremors, or seizure activity.  Aside from low sodium, remainder of his labs appear around his baseline.  Chest x-ray with chronic changes but no acute process.  He does have some expiratory wheezes on exam, family reports this is been chronic over the past several months.  Patient will require admission for further management of his hyponatremia and well as hypervolemic state.  Discussed with hospitalist, Dr. Myna Hidalgo-- he will admit for ongoing care.  Final Clinical Impressions(s) / ED Diagnoses   Final diagnoses:  Hyponatremia  Stage 3 chronic kidney disease ([place])  Other hypervolemia    ED  Discharge Orders    None       Larene Pickett, PA-C 11/27/17 [place], [place], DO 11/30/17 1512

## 2017-11-26 NOTE — Telephone Encounter (Signed)
-----   Message from [name], MD sent at 11/26/2017 12:31 PM EDT ----- Please notify wife  Unfortunately we have to refer him NOW to the ED for further management; hold off on taking the torsemide for now  ----- Message ----- From: Juliet Rude, CMA Sent: 11/26/2017  12:30 PM To: [name], MD  Critical lab  Sodium 120

## 2017-11-26 NOTE — Telephone Encounter (Signed)
I returned call to wife regarding husband (pt) weeping clear fluid from his skin at the front of his foot/ankle at the bend for a day or two.    Denies pain.  Has chronic swelling of legs due to Prednisone per wife. The right foot/ankle at the bend is weeping more than the left.   The left is only a very small amount.   The right is enough it gets his sock wet.    They are coming in for blood work today.  They are going to check with the staff while there regarding this weeping so I'm sending this back as a high priority note to Dr. Jenny Reichmann.      Reason for Disposition . Swollen ankle joint  (Exception: area of localized swelling which is itchy)    He has chronic problem with swollen legs.   Wife concerned that he is weeping clear fluid now.  No pain.  No injury.  Answer Assessment - Initial Assessment Questions 1. LOCATION: "Which joint is swollen?"     He is weeping clear fluid from the front/top of both ankles at the bend with the right weeping more than the left. 2. ONSET: "When did the swelling start?"     A day or two ago.   Wife noticed his sock on the right leg was wet. 3. SIZE: "How large is the swelling?"     "Dr Jenny Reichmann is aware of his leg swelling"   "I think it might be a little better but he is on Prednisone which makes his legs swell". Per wife. 4. PAIN: "Is there any pain?" If so, ask: "How bad is it?" (Scale 1-10; or mild, moderate, severe)     No pain 5. CAUSE: "What do you think caused the swollen joint?"     I'm not sure.   Maybe the Prednisone. 6. OTHER SYMPTOMS: "Do you have any other symptoms?" (e.g., fever, chest pain, difficulty breathing, calf pain)     No 7. PREGNANCY: "Is there any chance you are pregnant?" "When was your last menstrual period?"     N/A  Protocols used: ANKLE SWELLING-A-AH

## 2017-11-26 NOTE — Telephone Encounter (Signed)
shirron to let pt know to start torsemide 20 qd, ROV next wk

## 2017-11-27 ENCOUNTER — Other Ambulatory Visit: Payer: Self-pay

## 2017-11-27 ENCOUNTER — Inpatient Hospital Stay (HOSPITAL_COMMUNITY): Payer: Medicare HMO

## 2017-11-27 DIAGNOSIS — I10 Essential (primary) hypertension: Secondary | ICD-10-CM

## 2017-11-27 DIAGNOSIS — M316 Other giant cell arteritis: Secondary | ICD-10-CM

## 2017-11-27 DIAGNOSIS — N183 Chronic kidney disease, stage 3 (moderate): Secondary | ICD-10-CM

## 2017-11-27 DIAGNOSIS — R609 Edema, unspecified: Secondary | ICD-10-CM

## 2017-11-27 DIAGNOSIS — R0600 Dyspnea, unspecified: Secondary | ICD-10-CM

## 2017-11-27 DIAGNOSIS — E871 Hypo-osmolality and hyponatremia: Secondary | ICD-10-CM

## 2017-11-27 DIAGNOSIS — J449 Chronic obstructive pulmonary disease, unspecified: Secondary | ICD-10-CM

## 2017-11-27 LAB — BASIC METABOLIC PANEL
ANION GAP: 9 (ref 5–15)
ANION GAP: 10 (ref 5–15)
BUN: 39 mg/dL — AB (ref 6–20)
BUN: 42 mg/dL — ABNORMAL HIGH (ref 6–20)
CHLORIDE: 88 mmol/L — AB (ref 101–111)
CHLORIDE: 88 mmol/L — AB (ref 101–111)
CO2: 26 mmol/L (ref 22–32)
CO2: 25 mmol/L (ref 22–32)
CREATININE: 1.69 mg/dL — AB (ref 0.61–1.24)
Calcium: 7.7 mg/dL — ABNORMAL LOW (ref 8.9–10.3)
Calcium: 7.4 mg/dL — ABNORMAL LOW (ref 8.9–10.3)
Creatinine, Ser: 1.58 mg/dL — ABNORMAL HIGH (ref 0.61–1.24)
GFR calc Af Amer: 39 mL/min — ABNORMAL LOW (ref >60)
GFR calc non Af Amer: 34 mL/min — ABNORMAL LOW (ref >60)
GFR, EST AFRICAN AMERICAN: 42 mL/min — AB (ref >60)
GFR, EST NON AFRICAN AMERICAN: 37 mL/min — AB (ref >60)
Glucose, Bld: 100 mg/dL — ABNORMAL HIGH (ref 65–99)
Glucose, Bld: 131 mg/dL — ABNORMAL HIGH (ref 65–99)
POTASSIUM: 4.8 mmol/L (ref 3.5–5.1)
Potassium: 4.6 mmol/L (ref 3.5–5.1)
SODIUM: 123 mmol/L — AB (ref 135–145)
SODIUM: 123 mmol/L — AB (ref 135–145)

## 2017-11-27 LAB — ECHOCARDIOGRAM COMPLETE
HEIGHTINCHES: 65 in
Weight: 2934.76 oz

## 2017-11-27 LAB — CBC
HCT: 32.4 % — ABNORMAL LOW (ref 39.0–52.0)
HEMOGLOBIN: 11.3 g/dL — AB (ref 13.0–17.0)
MCH: 31.4 pg (ref 26.0–34.0)
MCHC: 34.9 g/dL (ref 30.0–36.0)
MCV: 90 fL (ref 78.0–100.0)
Platelets: 213 10*3/uL (ref 150–400)
RBC: 3.6 MIL/uL — AB (ref 4.22–5.81)
RDW: 15.9 % — AB (ref 11.5–15.5)
WBC: 10.9 10*3/uL — AB (ref 4.0–10.5)

## 2017-11-27 LAB — TSH: TSH: 1.045 u[IU]/mL (ref 0.350–4.500)

## 2017-11-27 LAB — OSMOLALITY: OSMOLALITY: 259 mosm/kg — AB (ref 275–295)

## 2017-11-27 LAB — BRAIN NATRIURETIC PEPTIDE: B Natriuretic Peptide: 594 pg/mL — ABNORMAL HIGH (ref 0.0–100.0)

## 2017-11-27 MED ORDER — FUROSEMIDE 10 MG/ML IJ SOLN
20.0000 mg | Freq: Two times a day (BID) | INTRAMUSCULAR | Status: DC
  Administered 2017-11-27 – 2017-11-28 (×3): 20 mg via INTRAVENOUS
  Filled 2017-11-27 (×5): qty 2

## 2017-11-27 MED ORDER — SODIUM CHLORIDE 1 G PO TABS
1.0000 g | ORAL_TABLET | Freq: Three times a day (TID) | ORAL | Status: DC
  Administered 2017-11-27 – 2017-11-28 (×5): 1 g via ORAL
  Filled 2017-11-27 (×7): qty 1

## 2017-11-27 NOTE — Progress Notes (Signed)
Patient seen and evaluated earlier this am by my associate. Please refer to H and P for details regarding assessment and plan.  Echocardiogram pending. Will continue monitoring sodium levels. Most likely hypervolemic hyponatremia. Pt is on lasix currently 20 mg IV bid.  Will monitor BMP q 6 hours  Gen: Pt in nad, alert and awake Cv: no cyanosis Pulm: equal chest rise, no wheezes  Echo pending  Velvet Bathe MD

## 2017-11-27 NOTE — Progress Notes (Signed)
  Echocardiogram 2D Echocardiogram has been performed.  [name] 11/27/2017, 3:56 PM

## 2017-11-28 LAB — BASIC METABOLIC PANEL
Anion gap: 9 (ref 5–15)
Anion gap: 9 (ref 5–15)
Anion gap: 9 (ref 5–15)
BUN: 47 mg/dL — AB (ref 6–20)
BUN: 50 mg/dL — AB (ref 6–20)
BUN: 49 mg/dL — AB (ref 6–20)
CHLORIDE: 91 mmol/L — AB (ref 101–111)
CHLORIDE: 91 mmol/L — AB (ref 101–111)
CO2: 24 mmol/L (ref 22–32)
CO2: 25 mmol/L (ref 22–32)
CO2: 26 mmol/L (ref 22–32)
Calcium: 7.4 mg/dL — ABNORMAL LOW (ref 8.9–10.3)
Calcium: 7.5 mg/dL — ABNORMAL LOW (ref 8.9–10.3)
Calcium: 7.7 mg/dL — ABNORMAL LOW (ref 8.9–10.3)
Chloride: 93 mmol/L — ABNORMAL LOW (ref 101–111)
Creatinine, Ser: 1.98 mg/dL — ABNORMAL HIGH (ref 0.61–1.24)
Creatinine, Ser: 2.01 mg/dL — ABNORMAL HIGH (ref 0.61–1.24)
Creatinine, Ser: 2.07 mg/dL — ABNORMAL HIGH (ref 0.61–1.24)
GFR calc Af Amer: 31 mL/min — ABNORMAL LOW (ref >60)
GFR calc Af Amer: 32 mL/min — ABNORMAL LOW (ref >60)
GFR calc Af Amer: 32 mL/min — ABNORMAL LOW (ref >60)
GFR calc non Af Amer: 26 mL/min — ABNORMAL LOW (ref >60)
GFR calc non Af Amer: 27 mL/min — ABNORMAL LOW (ref >60)
GFR calc non Af Amer: 28 mL/min — ABNORMAL LOW (ref >60)
GLUCOSE: 105 mg/dL — AB (ref 65–99)
GLUCOSE: 80 mg/dL (ref 65–99)
Glucose, Bld: 123 mg/dL — ABNORMAL HIGH (ref 65–99)
POTASSIUM: 4.3 mmol/L (ref 3.5–5.1)
POTASSIUM: 4.6 mmol/L (ref 3.5–5.1)
POTASSIUM: 4.1 mmol/L (ref 3.5–5.1)
SODIUM: 126 mmol/L — AB (ref 135–145)
SODIUM: 126 mmol/L — AB (ref 135–145)
Sodium: 125 mmol/L — ABNORMAL LOW (ref 135–145)

## 2017-11-28 MED ORDER — SODIUM CHLORIDE 1 G PO TABS
2.0000 g | ORAL_TABLET | Freq: Once | ORAL | Status: AC
  Administered 2017-11-28: 2 g via ORAL
  Filled 2017-11-28: qty 2

## 2017-11-28 MED ORDER — FUROSEMIDE 40 MG PO TABS
40.0000 mg | ORAL_TABLET | Freq: Every day | ORAL | Status: DC
  Administered 2017-11-29: 40 mg via ORAL
  Filled 2017-11-28: qty 1

## 2017-11-28 NOTE — Progress Notes (Signed)
Pt is up in a chair and family members in bedside, X-large size ted hose is ordered for patient, pt is diuresing well, will continue to monitor the patient  Palma Holter, RN

## 2017-11-28 NOTE — Progress Notes (Addendum)
PROGRESS NOTE    [name]  QQV:[id number] DOB: [birth year] DOA: 11/26/2017 PCP: [name], MD    Brief Narrative:  Patient is a 84 year old with history of hypertension, COPD, recent diagnosis of giant cell arteritis started on prednisone and Actemra who presented to the emergency department at the direction of his PCP for evaluation of hypokalemia and hyponatremia.  wife reports the patient has history of bypass to vasculature of lower extremities.    Assessment & Plan:   Principal Problem:   Hyponatremia - initially presumed to be secondary to hypervolemic hyponatremia but lower extremities may be swollen due to peripheral vascular disease patient has history of bypass to lower extremities. Also patient had echocardiogram which reported an EF of 50-55 percent.  - provide salt tablet component of this may be secondary to poor oral solute intake  Active Problems:   Essential hypertension - we'll continue patient on amlodipine  Acute diastolic chf - suspected given elevated bnp and edema - echocardiogram reported normal ef and study not Good enough for evaluation of diastolic function.     CKD (chronic kidney disease), stage III ([place]) - Patient has history of kidney removal and currently only has one kidney. Serum creatinine has raised 0.5 above baseline. As such I will discontinue Lasix for today. We'll restart oral Lasix next a.m. And appears contracted on lab work. I do not feel that there is much more we can pull off as far as extra fluid. Therefore I believe the patient's lower extremity edema is most likely secondary to peripheral vascular disease.     COPD (chronic obstructive pulmonary disease) (HCC) - patient is on albuterol - stable currently    Aortic stenosis   Temporal arteritis (HCC) - will continue prednisone    Peripheral edema - will obtain AKI - I suspect this more to PVD than diastolic chf - serum creatinine trending up on Lasix will discontinue -  order for compression stockings  DVT prophylaxis: Heparin Code Status: Full Family Communication: discussed with spouse and family member at bedside Disposition Plan: we'll reassess sodium levels next a.m.   Consultants:   None   Procedures: none   Antimicrobials: none   Subjective: Pt has no new complaint no acute issues overnight.   Objective: Vitals:   11/27/17 0658 11/27/17 1720 11/27/17 2044 11/28/17 0444  BP: (!) 146/59 (!) 129/56 (!) 159/78 (!) 140/53  Pulse: 73 77 84 73  Resp: 16 18 17 16   Temp: (!) 97.4 F (36.3 C) 98.4 F (36.9 C) 97.8 F (36.6 C) 98 F (36.7 C)  TempSrc: Oral Oral    SpO2: 98% 98% 93% 98%  Weight:   68.9 kg (152 lb)   Height:        Intake/Output Summary (Last 24 hours) at 11/28/2017 1458 Last data filed at 11/28/2017 1314 Gross per 24 hour  Intake 460 ml  Output 1900 ml  Net -1440 ml   Filed Weights   11/27/17 0230 11/27/17 2044  Weight: 83.2 kg (183 lb 6.8 oz) 68.9 kg (152 lb)    Examination:  General exam: Appears calm and comfortable, in nad.  Respiratory system: Clear to auscultation. Respiratory effort normal. Cardiovascular system: S1 & S2 heard, RRR. No JVD, murmurs, rubs, gallops or clicks. + pedal edema. Gastrointestinal system: Abdomen is nondistended, soft and nontender. No organomegaly or masses felt. Normal bowel sounds heard. Central nervous system: Alert and oriented. No focal neurological deficits. Extremities: Symmetric 5 x 5 power. Skin: No rashes, lesions or  ulcers Psychiatry: Mood & affect appropriate.   Data Reviewed: I have personally reviewed following labs and imaging studies  CBC: Recent Labs  Lab 11/22/17 1148 11/26/17 1532 11/27/17 0714  WBC 13.8* 11.4* 10.9*  NEUTROABS 11.3*  --   --   HGB 12.2* 12.4* 11.3*  HCT 36.4* 34.7* 32.4*  MCV 94.1 89.9 90.0  PLT 222.0 222 812   Basic Metabolic Panel: Recent Labs  Lab 11/27/17 0714 11/27/17 1420 11/28/17 0003 11/28/17 0531 11/28/17 1115    NA 123* 123* 125* 126* 126*  K 4.6 4.8 4.3 4.6 4.1  CL 88* 88* 91* 93* 91*  CO2 26 25 25 24 26   GLUCOSE 100* 131* 105* 80 123*  BUN 39* 42* 47* 50* 49*  CREATININE 1.58* 1.69* 2.07* 2.01* 1.98*  CALCIUM 7.7* 7.4* 7.4* 7.5* 7.7*   GFR: Estimated Creatinine Clearance: 21.1 mL/min (A) (by C-G formula based on SCr of 1.98 mg/dL (H)). Liver Function Tests: Recent Labs  Lab 11/26/17 1532  AST 20  ALT 24  ALKPHOS 63  BILITOT 0.7  PROT 5.0*  ALBUMIN 2.7*   No results for input(s): LIPASE, AMYLASE in the last 168 hours. No results for input(s): AMMONIA in the last 168 hours. Coagulation Profile: No results for input(s): INR, PROTIME in the last 168 hours. Cardiac Enzymes: No results for input(s): CKTOTAL, CKMB, CKMBINDEX, TROPONINI in the last 168 hours. BNP (last 3 results) No results for input(s): PROBNP in the last 8760 hours. HbA1C: No results for input(s): HGBA1C in the last 72 hours. CBG: No results for input(s): GLUCAP in the last 168 hours. Lipid Profile: No results for input(s): CHOL, HDL, LDLCALC, TRIG, CHOLHDL, LDLDIRECT in the last 72 hours. Thyroid Function Tests: Recent Labs    11/27/17 0006  TSH 1.045   Anemia Panel: No results for input(s): VITAMINB12, FOLATE, FERRITIN, TIBC, IRON, RETICCTPCT in the last 72 hours. Sepsis Labs: No results for input(s): PROCALCITON, LATICACIDVEN in the last 168 hours.  No results found for this or any previous visit (from the past 240 hour(s)).       Radiology Studies: Dg Chest 2 View  Result Date: 11/26/2017 CLINICAL DATA:  Hyponatremia, swelling EXAM: CHEST - 2 VIEW COMPARISON:  10/18/2017 FINDINGS: Normal heart size and pulmonary vascularity. Atherosclerotic calcification and tortuosity of thoracic aorta. Chronic peribronchial thickening and accentuation of perihilar markings. Basilar interstitial changes bilaterally, grossly stable. Upper lungs clear. No pleural effusion or pneumothorax. Bones demineralized.  Recall  stable IMPRESSION: Chronic bronchitic and interstitial changes without definite superimposed acute infiltrate. Electronically Signed   By: Lavonia Dana M.D.   On: 11/26/2017 16:42        Scheduled Meds: . allopurinol  100 mg Oral Daily  . amLODipine  5 mg Oral Daily  . aspirin EC  81 mg Oral Daily  . [START ON 11/29/2017] furosemide  40 mg Oral Daily  . heparin  5,000 Units Subcutaneous Q8H  . mirtazapine  15 mg Oral QHS  . pantoprazole  40 mg Oral Daily  . pravastatin  40 mg Oral QPM  . predniSONE  20 mg Oral Q breakfast  . sodium chloride flush  3 mL Intravenous Q12H   Continuous Infusions: . sodium chloride       LOS: 2 days    Time spent:  35 minutes    Velvet Bathe, MD [facility] Pager [phone]  If 7PM-7AM, please contact night-coverage [email or web address] Password [facility] 11/28/2017, 2:58 PM

## 2017-11-29 LAB — BASIC METABOLIC PANEL
Anion gap: 10 (ref 5–15)
BUN: 52 mg/dL — ABNORMAL HIGH (ref 6–20)
CALCIUM: 7.6 mg/dL — AB (ref 8.9–10.3)
CO2: 24 mmol/L (ref 22–32)
CREATININE: 1.96 mg/dL — AB (ref 0.61–1.24)
Chloride: 94 mmol/L — ABNORMAL LOW (ref 101–111)
GFR calc non Af Amer: 28 mL/min — ABNORMAL LOW (ref >60)
GFR, EST AFRICAN AMERICAN: 33 mL/min — AB (ref >60)
Glucose, Bld: 137 mg/dL — ABNORMAL HIGH (ref 65–99)
Potassium: 4.5 mmol/L (ref 3.5–5.1)
SODIUM: 128 mmol/L — AB (ref 135–145)

## 2017-11-29 NOTE — Care Management Note (Addendum)
Case Management Note  Patient Details  Name: [name] MRN: [id number] Date of Birth: [birth year]  Subjective/Objective:   History of COPD, CHF (EF of 50-55%), CKD III, bypass to lower extremities admitted for Hyponatremia/bilateral LE edema.          Action/Plan: Prior to admission patient lived at home with spouse.  Will be returning to the same living situation after discharge.  At discharge, patient has transportation home.  Patient has the ability to pay for  Medications/food. PCP noted.EF of 50-55 percent.  NCM will continue to monitor for discharge transition needs.  Expected Discharge Date:    11/30/2017         Expected Discharge Plan:   Home Self Care  In-House Referral:   N/A  Discharge planning Services  CM Consult  Post Acute Care Choice:    Choice offered to:     DME Arranged:    DME Agency:     HH Arranged:    HH Agency:     Status of Service:  In process, will continue to follow  Kristen Cardinal, RN  Nurse case Elizabeth 11/29/2017, 12:21 PM

## 2017-11-29 NOTE — Progress Notes (Signed)
Pt discharge instructions given, pt verbalized understanding.  VSS.  Denies pain.  Pt left floor via wheelchair accompanied by family and staff.

## 2017-11-29 NOTE — Consult Note (Signed)
   [facility] CM Inpatient Consult   11/29/2017  [name] 1925/01/29 [id number]  Patient screened for potential [place] Management services need. Patient is in the [place] of the [place] Management services under patient's [name] plan.  Patient admitted with Hyponatremia with a HX of but not limited to COPD.  Met with patient, wife and son, [name], to share information regarding [facility].  They were getting patient dressed and ready for return home. No needs noted per son.  Accepted a brochure, 24 hour nurse advise line magnet with contact information.   Please place a [facility] Care Management consult or for questions contact:   Natividad Brood, RN BSN [facility] Liaison  [phone] business mobile phone Toll free office [phone]

## 2017-11-29 NOTE — Discharge Summary (Signed)
Physician Discharge Summary  ALIAS VILLAGRAN QQP:[id number] DOB: [birth year] DOA: 11/26/2017  PCP: [name], MD  Admit date: 11/26/2017 Discharge date: 11/29/2017  Time spent:  35 minutes  Recommendations for Outpatient Follow-up:  1. Would recommend liberalizing diet 2. Monitor serum creatinine 3. F/u with ABI 4. Recommended increase salt in diet (offered salt tablets but family prefers to increase in diet)   Discharge Diagnoses:  Principal Problem:   Hyponatremia Active Problems:   Essential hypertension   COPD (chronic obstructive pulmonary disease) (HCC)   CKD (chronic kidney disease), stage III (HCC)   Aortic stenosis   Temporal arteritis (HCC)   Peripheral edema   Discharge Condition: stable  Diet recommendation: regular diet  Filed Weights   11/27/17 0230 11/27/17 2044 11/28/17 2035  Weight: 83.2 kg (183 lb 6.8 oz) 68.9 kg (152 lb) 68 kg (149 lb 14.6 oz)    History of present illness:  Patient is a 84 year old with history of hypertension, COPD, recent diagnosis of giant cell arteritis started on prednisone and Actemra who presented to the emergency department at the direction of his PCP for evaluation of hypokalemia and hyponatremia.  wife reports the patient has history of bypass to vasculature of lower extremities.     Hospital Course:  Hyponatremia - at this point I think multifactorial. Patient was on hydrochlorothiazide which was discontinued. Also I suspect decreased oral solute intake. Improved with increasing salt intake via salt tablets.as such on discharge and liberalize diet amended patient have increased salt in his diet. Family verbalizes agreement and understanding.  Lower extremity edema - Patient was diuresed with Lasix although he became contracted in serum creatinine started to elevate as such the Lasix discontinued. May continue home Lasix regimen on discharge I also think this is multifactorial. Albumin is low as such I suspect patient has  decreased oncotic pressure as such recommended improving diet as outpatient. Also suspect patient has peripheral vascular disease. Recommend ABI for further evaluation.patient did have elevated BNP but his echocardiogram reported normal EF and did not report on diastolic function due to quality of test. Of note he also has one kidney and this could also contribute to elevated BNP and blood levels. - also recommended compression stockings and elevating legs above level of heart.  Otherwise for no medical conditions we'll continue prior to admission medication regimen  Acute diastolic CHF - Improved after diuresis.  Procedures:  none  Consultations:  None  Discharge Exam: Vitals:   11/29/17 0506 11/29/17 0845  BP: (!) 156/55 (!) 152/58  Pulse: 77 74  Resp: 16 16  Temp: 97.7 F (36.5 C) 98 F (36.7 C)  SpO2: 100% 99%    General: Pt in nad, alert and awake Cardiovascular: rrr, no rubs Respiratory: no increased wob, no wheezes  Discharge Instructions   Discharge Instructions    Call MD for:  extreme fatigue   Complete by:  As directed    Call MD for:  persistant nausea and vomiting   Complete by:  As directed    Diet - low sodium heart healthy   Complete by:  As directed    Discharge instructions   Complete by:  As directed    Recommend patient follow-up with physician within the next week or sooner should any new concerns arise.   Increase activity slowly   Complete by:  As directed      Allergies as of 11/29/2017      Reactions   Sulfonamide Derivatives Rash  Medication List    STOP taking these medications   hydrochlorothiazide 25 MG tablet Commonly known as:  HYDRODIURIL   torsemide 20 MG tablet Commonly known as:  DEMADEX     TAKE these medications   ACTEMRA 162 MG/0.9ML [place] Generic drug:  Tocilizumab Inject 0.9 mLs into the skin once. AS DIRECTED   allopurinol 100 MG tablet Commonly known as:  ZYLOPRIM Take 1 tablet (100 mg total) by mouth  daily.   amLODipine 5 MG tablet Commonly known as:  NORVASC Take 1 tablet (5 mg total) by mouth daily.   aspirin EC 81 MG tablet Take 1 tablet (81 mg total) by mouth daily.   cetirizine 10 MG tablet Commonly known as:  ZYRTEC Take 10 mg by mouth daily.   furosemide 40 MG tablet Commonly known as:  LASIX 1 tab by mouth on Mon/Wed/Fri/Sat only What changed:    how much to take  how to take this  when to take this  additional instructions   mirtazapine 15 MG tablet Commonly known as:  REMERON Take 1 tablet (15 mg total) by mouth at bedtime.   omeprazole 40 MG capsule Commonly known as:  PRILOSEC Take 1 capsule (40 mg total) by mouth daily. What changed:  Another medication with the same name was changed. Make sure you understand how and when to take each.   omeprazole 20 MG capsule Commonly known as:  PRILOSEC TAKE 1 CAPSULE EVERY DAY What changed:    how much to take  how to take this  when to take this   polyethylene glycol packet Commonly known as:  MIRALAX / GLYCOLAX Take 17 g by mouth daily as needed for mild constipation.   pravastatin 40 MG tablet Commonly known as:  PRAVACHOL TAKE 1 TABLET EVERY EVENING What changed:    how much to take  how to take this  when to take this   predniSONE 20 MG tablet Commonly known as:  DELTASONE Take 2 tablets (40 mg total) by mouth daily with breakfast. What changed:    how much to take  when to take this  additional instructions   predniSONE 10 MG tablet Commonly known as:  DELTASONE Take 10-15 mg by mouth See admin instructions. Take 15 mg by mouth in the evening for 14 days then 10 mg by mouth in the evening for 14 days, then re-visit MD What changed:  Another medication with the same name was changed. Make sure you understand how and when to take each.      Allergies  Allergen Reactions  . Sulfonamide Derivatives Rash   Follow-up Information    [name], MD Follow up.   Specialties:   Internal Medicine, Radiology Contact information: [address] [place] [id number] [phone]            The results of significant diagnostics from this hospitalization (including imaging, microbiology, ancillary and laboratory) are listed below for reference.    Significant Diagnostic Studies: Dg Chest 2 View  Result Date: 11/26/2017 CLINICAL DATA:  Hyponatremia, swelling EXAM: CHEST - 2 VIEW COMPARISON:  10/18/2017 FINDINGS: Normal heart size and pulmonary vascularity. Atherosclerotic calcification and tortuosity of thoracic aorta. Chronic peribronchial thickening and accentuation of perihilar markings. Basilar interstitial changes bilaterally, grossly stable. Upper lungs clear. No pleural effusion or pneumothorax. Bones demineralized.  Recall stable IMPRESSION: Chronic bronchitic and interstitial changes without definite superimposed acute infiltrate. Electronically Signed   By: Lavonia Dana M.D.   On: 11/26/2017 16:42  Microbiology: No results found for this or any previous visit (from the past 240 hour(s)).   Labs: Basic Metabolic Panel: Recent Labs  Lab 11/27/17 1420 11/28/17 0003 11/28/17 0531 11/28/17 1115 11/29/17 0034  NA 123* 125* 126* 126* 128*  K 4.8 4.3 4.6 4.1 4.5  CL 88* 91* 93* 91* 94*  CO2 25 25 24 26 24   GLUCOSE 131* 105* 80 123* 137*  BUN 42* 47* 50* 49* 52*  CREATININE 1.69* 2.07* 2.01* 1.98* 1.96*  CALCIUM 7.4* 7.4* 7.5* 7.7* 7.6*   Liver Function Tests: Recent Labs  Lab 11/26/17 1532  AST 20  ALT 24  ALKPHOS 63  BILITOT 0.7  PROT 5.0*  ALBUMIN 2.7*   No results for input(s): LIPASE, AMYLASE in the last 168 hours. No results for input(s): AMMONIA in the last 168 hours. CBC: Recent Labs  Lab 11/26/17 1532 11/27/17 0714  WBC 11.4* 10.9*  HGB 12.4* 11.3*  HCT 34.7* 32.4*  MCV 89.9 90.0  PLT 222 213   Cardiac Enzymes: No results for input(s): CKTOTAL, CKMB, CKMBINDEX, TROPONINI in the last 168 hours. BNP: BNP (last 3  results) Recent Labs    11/27/17 0714  BNP 594.0*    ProBNP (last 3 results) No results for input(s): PROBNP in the last 8760 hours.  CBG: No results for input(s): GLUCAP in the last 168 hours.   Signed:  Velvet Bathe MD.  [facility] 11/29/2017, 1:49 PM

## 2017-11-30 ENCOUNTER — Telehealth: Payer: Self-pay | Admitting: *Deleted

## 2017-11-30 NOTE — Telephone Encounter (Signed)
Pt was on TCM report tried calling pt to make hosp f/u appt no answer x's 10 rings. Will retry later.Marland KitchenJohny Chess

## 2017-12-01 ENCOUNTER — Telehealth: Payer: Self-pay | Admitting: Internal Medicine

## 2017-12-01 NOTE — Telephone Encounter (Signed)
Copied from [place] [phone]. Topic: Inquiry >> Dec 01, 2017 11:57 AM Lennox Solders wrote: Reason for CRM: Sree PT from [facility] is calling to report . Pt is refusing service and will be discharged. Pt went to er on last Friday.

## 2017-12-01 NOTE — Telephone Encounter (Signed)
Called pt to make hosp f/u appt wife states he has appt already schedule for this Friday 4/5 @ 2:40. chane appt to a hosp f/u appt was not scheduled correctly. Also inform pt had some additional questions concerning hosp discharge. Completed TCM call below.[name]  Transition Care Management Follow-up Telephone Call   Date discharged? 11/29/17   How have you been since you were released from the hospital? Wife states husband is doing fine   Do you understand why you were in the hospital? YES   Do you understand the discharge instructions? YES   Where were you discharged to? Home   Items Reviewed:  Medications reviewed: YES  Allergies reviewed: YES  Dietary changes reviewed: YES  Referrals reviewed: No referral needed   Functional Questionnaire:   Activities of Daily Living (ADLs):   She states he are independent in the following: bathing and hygiene, feeding, continence, grooming, toileting and dressing States he require assistance with the following: ambulation   Any transportation issues/concerns?: NO   Any patient concerns? NO   Confirmed importance and date/time of follow-up visits scheduled YES, appt 12/03/17  Provider Appointment booked with Dr. Jenny Reichmann   Confirmed with patient if condition begins to worsen call PCP or go to the ER.  Patient was given the office number and encouraged to call back with question or concerns.  : YES

## 2017-12-01 NOTE — Telephone Encounter (Signed)
FYI

## 2017-12-03 ENCOUNTER — Telehealth: Payer: Self-pay

## 2017-12-03 ENCOUNTER — Ambulatory Visit (INDEPENDENT_AMBULATORY_CARE_PROVIDER_SITE_OTHER): Payer: Medicare HMO | Admitting: Internal Medicine

## 2017-12-03 ENCOUNTER — Encounter: Payer: Self-pay | Admitting: Internal Medicine

## 2017-12-03 ENCOUNTER — Other Ambulatory Visit (INDEPENDENT_AMBULATORY_CARE_PROVIDER_SITE_OTHER): Payer: Medicare HMO

## 2017-12-03 VITALS — BP 122/76 | HR 78 | Temp 97.9°F | Ht 65.0 in | Wt 177.0 lb

## 2017-12-03 DIAGNOSIS — N183 Chronic kidney disease, stage 3 unspecified: Secondary | ICD-10-CM

## 2017-12-03 DIAGNOSIS — R609 Edema, unspecified: Secondary | ICD-10-CM

## 2017-12-03 DIAGNOSIS — M316 Other giant cell arteritis: Secondary | ICD-10-CM | POA: Diagnosis not present

## 2017-12-03 DIAGNOSIS — E871 Hypo-osmolality and hyponatremia: Secondary | ICD-10-CM

## 2017-12-03 LAB — BASIC METABOLIC PANEL
BUN: 60 mg/dL — AB (ref 6–23)
CO2: 28 mEq/L (ref 19–32)
Calcium: 8.1 mg/dL — ABNORMAL LOW (ref 8.4–10.5)
Chloride: 97 mEq/L (ref 96–112)
Creatinine, Ser: 1.69 mg/dL — ABNORMAL HIGH (ref 0.40–1.50)
GFR: 40.62 mL/min — ABNORMAL LOW (ref >60.00)
GLUCOSE: 120 mg/dL — AB (ref 70–99)
POTASSIUM: 4.6 meq/L (ref 3.5–5.1)
Sodium: 132 mEq/L — ABNORMAL LOW (ref 135–145)

## 2017-12-03 NOTE — Patient Instructions (Signed)
Please continue all other medications as before, and refills have been done if requested.  Please have the pharmacy call with any other refills you may need.  Please continue your efforts at being more active, low cholesterol diet, and weight control.  You are otherwise up to date with prevention measures today.  Please keep your appointments with your specialists as you may have planned  Please go to the LAB in the Basement (turn left off the elevator) for the tests to be done today  You will be contacted by phone if any changes need to be made immediately.  Otherwise, you will receive a letter about your results with an explanation, but please check with MyChart first.  Please remember to sign up for MyChart if you have not done so, as this will be important to you in the future with finding out test results, communicating by private email, and scheduling acute appointments online when needed.  Please return in 1 months, or sooner if needed

## 2017-12-03 NOTE — Telephone Encounter (Signed)
-----   Message from [name], MD sent at 12/03/2017  4:20 PM EDT ----- Madaline Brilliant for shirron to let family know  - sodium is further improved, and kidney fxn better, to cont same tx

## 2017-12-03 NOTE — Progress Notes (Signed)
Subjective:    Patient ID: [name], male    DOB: 31-Oct-1924, 84 y.o.   MRN: [id number]  HPI  Here to f/u with wife and son for support; pt now on  Prednisone 20 am/15 pm, with decreased again next wed to 20am/10pm, then f/u rheum next week, also begun on Actemra shots once weekly; was recently hospd with hyponatrmia, improved to 128 at d/c one week ago; wife and family trying to help liberalize diet with more salt, but will consider salt talbets if necessary after f/u lab today.  His only significant complaint is generalized weakness possibly worsening with the high dose prednisone, has had recent PT and declines further, though we d/w pt possible steroid myopathy.  Pt denies chest pain, increased sob or doe, wheezing, orthopnea, PND, increased LE swelling, palpitations, dizziness or syncope.  Pt denies new neurological symptoms such as new headache, or facial or extremity weakness or numbness, and vision is no change.  Pt denies polydipsia, polyuria  No other interval hx or new complaints Past Medical History:  Diagnosis Date  . ALLERGIC RHINITIS 09/29/2007  . Aortic stenosis 02/05/2017  . BACK PAIN 01/31/2009  . BENIGN PROSTATIC HYPERTROPHY 05/12/2007  . CHEST PAIN 01/31/2008  . CHRONIC OBSTRUCTIVE PULMONARY DISEASE, ACUTE EXACERBATION 08/17/2007  . CONJUNCTIVITIS, ALLERGIC, CHRONIC 03/24/2010  . CONSTIPATION 02/19/2010  . COPD 05/12/2007  . GERD 02/23/2010  . GOUT 07/04/2010  . History of CVA (cerebrovascular accident) 05/03/2011  . HYPERLIPIDEMIA 05/12/2007  . HYPERTENSION 05/12/2007  . NEPHROLITHIASIS, HX OF 08/17/2007  . OBESITY 05/12/2007  . PEPTIC ULCER DISEASE 05/12/2007  . PERIPHERAL VASCULAR DISEASE 05/12/2007  . Personal history of unspecified circulatory disease 05/12/2007  . PLANTAR FASCIITIS, RIGHT 09/27/2008  . RENAL CELL CANCER 05/12/2007  . RENAL INSUFFICIENCY 08/17/2007  . SKIN LESION 11/07/2010  . VERTIGO 09/29/2007  . WRIST PAIN, RIGHT 02/26/2009   Past Surgical History:    Procedure Laterality Date  . ARTERY BIOPSY Bilateral 10/27/2017   Procedure: BILATERAL TEMPORAL ARTERY BIOPSY;  Surgeon: Serafina Mitchell, MD;  Location: [facility] OR;  Service: Vascular;  Laterality: Bilateral;  . CATARACT EXTRACTION    . ear surgury    . NEPHRECTOMY    . NEPHROURETERECTOMY    . s.p left knee replacement  10/08  . s/p bilat fem pop bypass      reports that he has been smoking.  He has never used smokeless tobacco. He reports that he does not drink alcohol or use drugs. family history includes Alcohol abuse in his brother; Cancer in his daughter, mother, other, and other; Heart disease in his brother. Allergies  Allergen Reactions  . Sulfonamide Derivatives Rash   Current Outpatient Medications on File Prior to Visit  Medication Sig Dispense Refill  . ACTEMRA 162 MG/0.9ML SOSY Inject 0.9 mLs into the skin once. AS DIRECTED    . allopurinol (ZYLOPRIM) 100 MG tablet Take 1 tablet (100 mg total) by mouth daily. 90 tablet 3  . amLODipine (NORVASC) 5 MG tablet Take 1 tablet (5 mg total) by mouth daily. 90 tablet 3  . aspirin EC 81 MG tablet Take 1 tablet (81 mg total) by mouth daily. 90 tablet 3  . cetirizine (ZYRTEC) 10 MG tablet Take 10 mg by mouth daily.     . furosemide (LASIX) 40 MG tablet 1 tab by mouth on Mon/Wed/Fri/Sat only (Patient taking differently: Take 40 mg by mouth See admin instructions. Take 40 mg by mouth on Mon/Wed/Fri/Sat only) 30 tablet 11  .  mirtazapine (REMERON) 15 MG tablet Take 1 tablet (15 mg total) by mouth at bedtime. 90 tablet 3  . omeprazole (PRILOSEC) 20 MG capsule TAKE 1 CAPSULE EVERY DAY (Patient taking differently: Take 20 mg by mouth once a day) 90 capsule 3  . polyethylene glycol (MIRALAX / GLYCOLAX) packet Take 17 g by mouth daily as needed for mild constipation.    . pravastatin (PRAVACHOL) 40 MG tablet TAKE 1 TABLET EVERY EVENING (Patient taking differently: Take 40 mg by mouth in the evening) 90 tablet 3  . predniSONE (DELTASONE) 10 MG tablet  Take 10-15 mg by mouth See admin instructions. Take 15 mg by mouth in the evening for 14 days then 10 mg by mouth in the evening for 14 days, then re-visit MD  1  . predniSONE (DELTASONE) 20 MG tablet Take 2 tablets (40 mg total) by mouth daily with breakfast. (Patient taking differently: Take 20 mg by mouth See admin instructions. Take 20 mg by mouth in the morning for 28 days, then re-visit MD) 90 tablet 1  . omeprazole (PRILOSEC) 40 MG capsule Take 1 capsule (40 mg total) by mouth daily. (Patient not taking: Reported on 11/26/2017) 30 capsule 2   No current facility-administered medications on file prior to visit.    Review of Systems  Constitutional: Negative for other unusual diaphoresis or sweats HENT: Negative for ear discharge or swelling Eyes: Negative for other worsening visual disturbances Respiratory: Negative for stridor or other swelling  Gastrointestinal: Negative for worsening distension or other blood Genitourinary: Negative for retention or other urinary change Musculoskeletal: Negative for other MSK pain or swelling Skin: Negative for color change or other new lesions Neurological: Negative for worsening tremors and other numbness  Psychiatric/Behavioral: Negative for worsening agitation or other fatigue All other system neg per pt    Objective:   Physical Exam BP 122/76   Pulse 78   Temp 97.9 F (36.6 C) (Oral)   Ht 5\' 5"  (1.651 m)   Wt 177 lb (80.3 kg)   SpO2 97%   BMI 29.45 kg/m  VS noted,  Constitutional: Pt appears in NAD HENT: Head: NCAT.  Right Ear: External ear normal.  Left Ear: External ear normal.  Eyes: . Pupils are equal, round, and reactive to light. Conjunctivae and EOM are normal Nose: without d/c or deformity Neck: Neck supple. Gross normal ROM Cardiovascular: Normal rate and regular rhythm.   Pulmonary/Chest: Effort normal and breath sounds without rales or wheezing.  Abd:  Soft, NT, ND, + BS, no organomegaly Neurological: Pt is alert. At  baseline orientation, motor grossly intact Skin: Skin is warm. No rashes, other new lesions, trace pedal edema bilat only Psychiatric: Pt behavior is normal without agitation  No other exam findings    Assessment & Plan:

## 2017-12-03 NOTE — Telephone Encounter (Signed)
Pt's wife has been informed and expressed understanding. 

## 2017-12-04 NOTE — Assessment & Plan Note (Signed)
Stable no change,  to f/u any worsening symptoms or concerns

## 2017-12-04 NOTE — Assessment & Plan Note (Signed)
For f/u rheum next wk, cont prednisone tapering and actemra

## 2017-12-04 NOTE — Assessment & Plan Note (Addendum)
Improved, for f/u lab today, consider salt tablets  Note:  Total time for pt hx, exam, review of record with pt in the room, determination of diagnoses and plan for further eval and tx is > 40 min, with over 50% spent in coordination and counseling of patient including the differential dx, tx, further evaluation and other management of hyponatremia, leg swelling, temporal arteritis, CKD

## 2017-12-04 NOTE — Assessment & Plan Note (Signed)
Stable, also for f/u lab

## 2017-12-06 ENCOUNTER — Other Ambulatory Visit: Payer: Self-pay

## 2017-12-06 ENCOUNTER — Telehealth: Payer: Self-pay | Admitting: Internal Medicine

## 2017-12-06 MED ORDER — CITALOPRAM HYDROBROMIDE 10 MG PO TABS: 10.0000 mg | ORAL_TABLET | Freq: Every day | ORAL | 3 refills | Status: DC

## 2017-12-06 NOTE — Telephone Encounter (Signed)
Copied from [place] [id number]. Topic: Inquiry >> Dec 06, 2017  1:07 PM Conception Chancy, NT wrote: Mrs. Nyoka Cowden is a RN with [facility] and she reached out to the patient wife and states the patient recently lost vision and patient is now very depressed. States that he has mentioned to his wife about hurting himself. States he did not say it today but has said it in the past few days. Mrs. Nyoka Cowden is requesting a call back from Dr. Jenny Reichmann nurse. Please advise.   Cb# [phone]

## 2017-12-06 NOTE — Patient Outreach (Signed)
[facility]) Care Management  12/06/2017  JAKEEM GRAPE 04-Jan-1925 [id number]  EMMI: General discharge RED aleryt Referral date: 12/06/17 Referral reason: Sad/ anxious/ hopeless/ empty Day # 4  Telephone call to patient regarding EMMI general discharge red alert. HIPAA verified with patient. Patient gave verbal authorization to speak with his wife, Earon Rivest.  Explained reason for call. Wife states patient is depressed due to the recent loss of his vision. Wife states this has been going on since November 01, 2017.  Wife states patient has inflamed artery in his head which caused the blood to be blocked to his right eye. Now he has no vision in his right eye and can only see a little light in his left eye. Wife states patient had home health per for about 5 visits but patient had the services stopped. Wife states patient is hard of hearing and he became frustrated when she had to keep interpreting for the home health person. Wife states you have to speak very loud when talking with the patient.  Wife state patient is devastated because he is unable to see her, his children, watch TV, or read the paper. Wife states patient uses a walker for ambulation. States patients legs/ feet are very swollen due to the fluid. Wife states patients legs weep and soak his socks due to so much fluid. Wife states she has been advised by the doctor to elevate patients legs as much as possible. Wife states patients heart beat is strong but has problems when his heart relaxes and this is what causes him to have so much swelling. Wife states patient is taking a fluid pill and prednisone as advised by the doctor. Wife states patient saw his primary MD on 12/03/17 and has a follow up on 12/20/17.  Wife states, "Rayyan has mentioned to me that he would hurt himself if he had the chance because he can't live this way." Wife state, " He is doing better since I have talked with him about the [place]. He says he won't hurt  himself."  RNCM discussed and offered [facility] care management services for patient. Wife states patient really doesn't want anyone in the house and refused services at this time.  RNCM asked wife to discuss services with patient, gave wife RNCM's direct phone line to call if additional information was needed or would agree to services. RNCM discussed [place] social worker follow up related to patients depression symptoms. Offered [facility] community case management follow up.  RNCM expressed to wife her concern and informed her she would update patients primary MD.  Wife verbalized understanding and stated she would talk with patient regarding [facility] services.  RNCM advised patient to notify MD of any changes in condition prior to scheduled appointment. RNCM provided contact name and number: [phone] or main office number [phone] and 24 hour nurse advise line [phone].  RNCM verified patient aware of 911 services for urgent/ emergent needs.,  RNCM called patients primary MD office and left message with Lovena Le for return call from primary doctor's nurse.    PLAN;  RNCM will close patient due to refusal of services.  RNCM will notify patients primary MD of wife's concern of patient depression symptoms and verbal statement of hurting himself.  Quinn Plowman RN,BSN,CCM [facility] Telephonic  [phone]

## 2017-12-06 NOTE — Addendum Note (Signed)
Addended by: [name] on: 12/06/2017 05:19 PM   Modules accepted: Orders

## 2017-12-06 NOTE — Telephone Encounter (Signed)
Ok for citalopram 10 mg daily

## 2017-12-06 NOTE — Telephone Encounter (Signed)
Please advise 

## 2017-12-07 ENCOUNTER — Other Ambulatory Visit: Payer: Self-pay

## 2017-12-07 NOTE — Telephone Encounter (Signed)
Spoke with Mrs. Green and informed her medication was sent in for the pt to try.

## 2017-12-07 NOTE — Patient Outreach (Signed)
[facility]) Care Management  12/07/2017  ZACHARIA SOWLES 1925-08-11 [id number]  Received call from [place] with Dr. Gwynn Burly office. Shirrone states Dr. Jenny Reichmann received message regarding patient concerns and depression symptoms.  States Dr. Jenny Reichmann called patient in a prescription for Celexa 10mg  1 time per day.  RNCM expressed appreciation and informed Shirrone she will call patient and/or his wife to inform them of prescription.   RNCM called and spoke with patient's spouse.  Informed her that Dr. Jenny Reichmann called in prescription for Celexa for patient to aid in depression symptoms. Wife expressed appreciation. RNCM advised that patient should not stop medication abruptly.  RNCM informed wife to advise patient to take medication as directed, if any questions regarding the medication ask pharmacist or contact Dr.John's office. Wife verbalized understanding. States either she or her daughter will pick up the medication for patient today.   PLAN;  No further follow up needed by this RNCM at this time.   Quinn Plowman RN,BSN,CCM [facility] Telephonic  [phone]

## 2017-12-10 ENCOUNTER — Ambulatory Visit: Payer: Self-pay | Admitting: *Deleted

## 2017-12-10 NOTE — Telephone Encounter (Signed)
Attempted to call pt to discuss questions but not answer at this time.

## 2017-12-13 ENCOUNTER — Other Ambulatory Visit: Payer: Self-pay | Admitting: Internal Medicine

## 2017-12-13 DIAGNOSIS — N184 Chronic kidney disease, stage 4 (severe): Secondary | ICD-10-CM | POA: Diagnosis not present

## 2017-12-13 DIAGNOSIS — N2581 Secondary hyperparathyroidism of renal origin: Secondary | ICD-10-CM | POA: Diagnosis not present

## 2017-12-13 DIAGNOSIS — N189 Chronic kidney disease, unspecified: Secondary | ICD-10-CM | POA: Diagnosis not present

## 2017-12-20 ENCOUNTER — Ambulatory Visit: Payer: Medicare HMO | Admitting: Internal Medicine

## 2017-12-20 DIAGNOSIS — M316 Other giant cell arteritis: Secondary | ICD-10-CM | POA: Diagnosis not present

## 2017-12-20 DIAGNOSIS — E663 Overweight: Secondary | ICD-10-CM | POA: Diagnosis not present

## 2017-12-20 DIAGNOSIS — J449 Chronic obstructive pulmonary disease, unspecified: Secondary | ICD-10-CM | POA: Diagnosis not present

## 2017-12-20 DIAGNOSIS — T887XXA Unspecified adverse effect of drug or medicament, initial encounter: Secondary | ICD-10-CM | POA: Diagnosis not present

## 2017-12-20 DIAGNOSIS — Z6828 Body mass index (BMI) 28.0-28.9, adult: Secondary | ICD-10-CM | POA: Diagnosis not present

## 2017-12-20 DIAGNOSIS — N183 Chronic kidney disease, stage 3 (moderate): Secondary | ICD-10-CM | POA: Diagnosis not present

## 2017-12-20 DIAGNOSIS — R600 Localized edema: Secondary | ICD-10-CM | POA: Diagnosis not present

## 2017-12-22 DIAGNOSIS — N2581 Secondary hyperparathyroidism of renal origin: Secondary | ICD-10-CM | POA: Diagnosis not present

## 2017-12-22 DIAGNOSIS — I129 Hypertensive chronic kidney disease with stage 1 through stage 4 chronic kidney disease, or unspecified chronic kidney disease: Secondary | ICD-10-CM | POA: Diagnosis not present

## 2017-12-22 DIAGNOSIS — D631 Anemia in chronic kidney disease: Secondary | ICD-10-CM | POA: Diagnosis not present

## 2017-12-22 DIAGNOSIS — N183 Chronic kidney disease, stage 3 (moderate): Secondary | ICD-10-CM | POA: Diagnosis not present

## 2018-01-12 DIAGNOSIS — H6123 Impacted cerumen, bilateral: Secondary | ICD-10-CM | POA: Diagnosis not present

## 2018-01-12 DIAGNOSIS — H60333 Swimmer's ear, bilateral: Secondary | ICD-10-CM | POA: Diagnosis not present

## 2018-01-13 ENCOUNTER — Encounter: Payer: Self-pay | Admitting: Internal Medicine

## 2018-01-13 ENCOUNTER — Ambulatory Visit (INDEPENDENT_AMBULATORY_CARE_PROVIDER_SITE_OTHER): Payer: Medicare HMO | Admitting: Internal Medicine

## 2018-01-13 VITALS — BP 150/66 | HR 78 | Temp 97.5°F | Ht 65.0 in | Wt 165.0 lb

## 2018-01-13 DIAGNOSIS — Z0000 Encounter for general adult medical examination without abnormal findings: Secondary | ICD-10-CM

## 2018-01-13 NOTE — Progress Notes (Signed)
Subjective:    Patient ID: [name], male    DOB: 04-14-1925, 84 y.o.   MRN: [id number]  HPI  Here for wellness and f/u;  Overall doing ok;  Pt denies Chest pain, worsening SOB, DOE, wheezing, orthopnea, PND, worsening LE edema, palpitations, dizziness or syncope.  Pt denies neurological change such as new headache, facial or extremity weakness.  Pt denies polydipsia, polyuria, or low sugar symptoms. Pt states overall good compliance with treatment and medications, good tolerability, and has been trying to follow appropriate diet.  Pt denies worsening depressive symptoms, suicidal ideation or panic. No fever, night sweats, wt loss, loss of appetite, or other constitutional symptoms.  Pt states good ability with ADL's, has low fall risk, home safety reviewed and adequate, no other significant changes in hearing or vision, and not active with exercise.  Walks with walker room to room.  Leg swelling much better with wt loss after 5 days of lasix 40 bid then back to once per day after recent renal visit with labs. Wt Readings from Last 3 Encounters:  01/13/18 165 lb (74.8 kg)  12/03/17 177 lb (80.3 kg)  11/28/17 149 lb 14.6 oz (68 kg)   BP Readings from Last 3 Encounters:  01/13/18 (!) 150/66  12/03/17 122/76  11/29/17 (!) 152/58  Right ext canal infxn improved after ENT.  Prednisone able to be decreased to 10 mg am, and 5 pm, has f/u with rheum next week.  Has been doing better with actemra as well.  No other interval hx or new complaint Past Medical History:  Diagnosis Date  . ALLERGIC RHINITIS 09/29/2007  . Aortic stenosis 02/05/2017  . BACK PAIN 01/31/2009  . BENIGN PROSTATIC HYPERTROPHY 05/12/2007  . CHEST PAIN 01/31/2008  . CHRONIC OBSTRUCTIVE PULMONARY DISEASE, ACUTE EXACERBATION 08/17/2007  . CONJUNCTIVITIS, ALLERGIC, CHRONIC 03/24/2010  . CONSTIPATION 02/19/2010  . COPD 05/12/2007  . GERD 02/23/2010  . GOUT 07/04/2010  . History of CVA (cerebrovascular accident) 05/03/2011  .  HYPERLIPIDEMIA 05/12/2007  . HYPERTENSION 05/12/2007  . NEPHROLITHIASIS, HX OF 08/17/2007  . OBESITY 05/12/2007  . PEPTIC ULCER DISEASE 05/12/2007  . PERIPHERAL VASCULAR DISEASE 05/12/2007  . Personal history of unspecified circulatory disease 05/12/2007  . PLANTAR FASCIITIS, RIGHT 09/27/2008  . RENAL CELL CANCER 05/12/2007  . RENAL INSUFFICIENCY 08/17/2007  . SKIN LESION 11/07/2010  . VERTIGO 09/29/2007  . WRIST PAIN, RIGHT 02/26/2009   Past Surgical History:  Procedure Laterality Date  . ARTERY BIOPSY Bilateral 10/27/2017   Procedure: BILATERAL TEMPORAL ARTERY BIOPSY;  Surgeon: Serafina Mitchell, MD;  Location: [facility] OR;  Service: Vascular;  Laterality: Bilateral;  . CATARACT EXTRACTION    . ear surgury    . NEPHRECTOMY    . NEPHROURETERECTOMY    . s.p left knee replacement  10/08  . s/p bilat fem pop bypass      reports that he has been smoking.  He has never used smokeless tobacco. He reports that he does not drink alcohol or use drugs. family history includes Alcohol abuse in his brother; Cancer in his daughter, mother, other, and other; Heart disease in his brother. Allergies  Allergen Reactions  . Sulfonamide Derivatives Rash   Current Outpatient Medications on File Prior to Visit  Medication Sig Dispense Refill  . ACTEMRA 162 MG/0.9ML SOSY Inject 0.9 mLs into the skin once. AS DIRECTED    . allopurinol (ZYLOPRIM) 100 MG tablet TAKE 1 TABLET EVERY DAY 90 tablet 3  . amLODipine (NORVASC) 5 MG tablet TAKE  1 TABLET (5 MG TOTAL) BY MOUTH DAILY. 90 tablet 3  . aspirin EC 81 MG tablet Take 1 tablet (81 mg total) by mouth daily. 90 tablet 3  . cetirizine (ZYRTEC) 10 MG tablet Take 10 mg by mouth daily.     . citalopram (CELEXA) 10 MG tablet Take 1 tablet (10 mg total) by mouth daily. 90 tablet 3  . furosemide (LASIX) 40 MG tablet 1 tab by mouth on Mon/Wed/Fri/Sat only (Patient taking differently: Take 40 mg by mouth See admin instructions. Take 40 mg by mouth on Mon/Wed/Fri/Sat only) 30 tablet  11  . mirtazapine (REMERON) 15 MG tablet Take 1 tablet (15 mg total) by mouth at bedtime. 90 tablet 3  . omeprazole (PRILOSEC) 20 MG capsule TAKE 1 CAPSULE EVERY DAY (Patient taking differently: Take 20 mg by mouth once a day) 90 capsule 3  . polyethylene glycol (MIRALAX / GLYCOLAX) packet Take 17 g by mouth daily as needed for mild constipation.    . pravastatin (PRAVACHOL) 40 MG tablet TAKE 1 TABLET EVERY EVENING (Patient taking differently: Take 40 mg by mouth in the evening) 90 tablet 3  . predniSONE (DELTASONE) 5 MG tablet 10 mg in the morning and 5 mg at night.  0  . omeprazole (PRILOSEC) 40 MG capsule Take 1 capsule (40 mg total) by mouth daily. (Patient not taking: Reported on 11/26/2017) 30 capsule 2   No current facility-administered medications on file prior to visit.    Review of Systems Constitutional: Negative for other unusual diaphoresis, sweats, appetite or weight changes HENT: Negative for other worsening hearing loss, ear pain, facial swelling, mouth sores or neck stiffness.   Eyes: Negative for other worsening pain, redness or other visual disturbance.  Respiratory: Negative for other stridor or swelling Cardiovascular: Negative for other palpitations or other chest pain  Gastrointestinal: Negative for worsening diarrhea or loose stools, blood in stool, distention or other pain Genitourinary: Negative for hematuria, flank pain or other change in urine volume.  Musculoskeletal: Negative for myalgias or other joint swelling.  Skin: Negative for other color change, or other wound or worsening drainage.  Neurological: Negative for other syncope or numbness. Hematological: Negative for other adenopathy or swelling Psychiatric/Behavioral: Negative for hallucinations, other worsening agitation, SI, self-injury, or new decreased concentration All other system neg per pt    Objective:   Physical Exam BP (!) 150/66 (BP Location: Left Arm, Patient Position: Sitting, Cuff Size:  Normal)   Pulse 78   Temp (!) 97.5 F (36.4 C) (Oral)   Ht 5\' 5"  (1.651 m)   Wt 165 lb (74.8 kg)   SpO2 95%   BMI 27.46 kg/m  VS noted, examined in wheelchair Constitutional: Pt is oriented to person, place, and time. Appears well-developed and well-nourished, in no significant distress and comfortable Head: Normocephalic and atraumatic  Eyes: Conjunctivae and EOM are normal. Pupils are equal, round, and reactive to light Right Ear: External ear normal without discharge Left Ear: External ear normal without discharge Nose: Nose without discharge or deformity Mouth/Throat: Oropharynx is without other ulcerations and moist  Neck: Normal range of motion. Neck supple. No JVD present. No tracheal deviation present or significant neck LA or mass Cardiovascular: Normal rate, regular rhythm, normal heart sounds and intact distal pulses.   Pulmonary/Chest: WOB normal and breath sounds without rales or wheezing  Abdominal: Soft. Bowel sounds are normal. NT. No HSM  Musculoskeletal: Normal range of motion. Exhibits trace bilat edema Lymphadenopathy: Has no other cervical adenopathy.  Neurological:  Pt is alert and oriented to person, place, and time. Pt has normal reflexes. No cranial nerve deficit. Motor grossly intact, Gait intact Skin: Skin is warm and dry. No rash noted or new ulcerations Psychiatric:  Has normal mood and affect. Behavior is normal without agitation No other exam findings  Lab Results  Component Value Date   WBC 10.9 (H) 11/27/2017   HGB 11.3 (L) 11/27/2017   HCT 32.4 (L) 11/27/2017   PLT 213 11/27/2017   GLUCOSE 120 (H) 12/03/2017   CHOL 101 10/26/2017   TRIG 103 10/26/2017   HDL 37 (L) 10/26/2017   LDLCALC 43 10/26/2017   ALT 24 11/26/2017   AST 20 11/26/2017   NA 132 (L) 12/03/2017   K 4.6 12/03/2017   CL 97 12/03/2017   CREATININE 1.69 (H) 12/03/2017   BUN 60 (H) 12/03/2017   CO2 28 12/03/2017   TSH 1.045 11/27/2017   PSA 0.73 10/18/2017   INR 1.21  10/27/2017   HGBA1C 5.5 10/26/2017      Assessment & Plan:

## 2018-01-13 NOTE — Patient Instructions (Signed)
Please continue all other medications as before, and refills have been done if requested.  Please have the pharmacy call with any other refills you may need.  Please keep your appointments with your specialists as you may have planned  Please return in 6 months, or sooner if needed 

## 2018-01-15 NOTE — Assessment & Plan Note (Signed)

## 2018-01-17 ENCOUNTER — Ambulatory Visit: Payer: Medicare HMO | Admitting: Internal Medicine

## 2018-01-18 ENCOUNTER — Emergency Department (HOSPITAL_COMMUNITY)
Admission: EM | Admit: 2018-01-18 | Discharge: 2018-01-18 | Discharge status: Against Medical Advice | Payer: Medicare HMO

## 2018-01-18 DIAGNOSIS — R600 Localized edema: Secondary | ICD-10-CM | POA: Diagnosis not present

## 2018-01-18 DIAGNOSIS — J449 Chronic obstructive pulmonary disease, unspecified: Secondary | ICD-10-CM | POA: Diagnosis not present

## 2018-01-18 DIAGNOSIS — N183 Chronic kidney disease, stage 3 (moderate): Secondary | ICD-10-CM | POA: Diagnosis not present

## 2018-01-18 DIAGNOSIS — T887XXA Unspecified adverse effect of drug or medicament, initial encounter: Secondary | ICD-10-CM | POA: Diagnosis not present

## 2018-01-18 DIAGNOSIS — E663 Overweight: Secondary | ICD-10-CM | POA: Diagnosis not present

## 2018-01-18 DIAGNOSIS — Z6827 Body mass index (BMI) 27.0-27.9, adult: Secondary | ICD-10-CM | POA: Diagnosis not present

## 2018-01-18 DIAGNOSIS — M316 Other giant cell arteritis: Secondary | ICD-10-CM | POA: Diagnosis not present

## 2018-01-18 DIAGNOSIS — R0602 Shortness of breath: Secondary | ICD-10-CM | POA: Diagnosis not present

## 2018-01-18 DIAGNOSIS — R05 Cough: Secondary | ICD-10-CM | POA: Diagnosis not present

## 2018-01-18 NOTE — ED Notes (Signed)
Writer called X2 for triage room, no response.

## 2018-01-18 NOTE — ED Notes (Signed)
Called for triage room X1, no response

## 2018-01-19 ENCOUNTER — Other Ambulatory Visit: Payer: Self-pay

## 2018-01-19 ENCOUNTER — Emergency Department (HOSPITAL_COMMUNITY): Payer: Medicare HMO

## 2018-01-19 ENCOUNTER — Encounter (HOSPITAL_COMMUNITY): Payer: Self-pay | Admitting: Emergency Medicine

## 2018-01-19 ENCOUNTER — Emergency Department (HOSPITAL_BASED_OUTPATIENT_CLINIC_OR_DEPARTMENT_OTHER)
Admit: 2018-01-19 | Discharge: 2018-01-19 | Disposition: A | Discharge status: Home / Self Care | Payer: Medicare HMO | Attending: Emergency Medicine | Admitting: Emergency Medicine

## 2018-01-19 ENCOUNTER — Inpatient Hospital Stay (HOSPITAL_COMMUNITY)
Admission: EM | Admit: 2018-01-19 | Discharge: 2018-01-23 | DRG: 291 | Disposition: A | Discharge status: Home Health | Payer: Medicare HMO | Attending: Internal Medicine | Admitting: Internal Medicine

## 2018-01-19 DIAGNOSIS — I350 Nonrheumatic aortic (valve) stenosis: Secondary | ICD-10-CM | POA: Diagnosis present

## 2018-01-19 DIAGNOSIS — M79609 Pain in unspecified limb: Secondary | ICD-10-CM

## 2018-01-19 DIAGNOSIS — L8990 Pressure ulcer of unspecified site, unspecified stage: Secondary | ICD-10-CM | POA: Diagnosis present

## 2018-01-19 DIAGNOSIS — Z96653 Presence of artificial knee joint, bilateral: Secondary | ICD-10-CM | POA: Diagnosis present

## 2018-01-19 DIAGNOSIS — E785 Hyperlipidemia, unspecified: Secondary | ICD-10-CM | POA: Diagnosis present

## 2018-01-19 DIAGNOSIS — R609 Edema, unspecified: Secondary | ICD-10-CM

## 2018-01-19 DIAGNOSIS — I10 Essential (primary) hypertension: Secondary | ICD-10-CM | POA: Diagnosis not present

## 2018-01-19 DIAGNOSIS — N189 Chronic kidney disease, unspecified: Secondary | ICD-10-CM

## 2018-01-19 DIAGNOSIS — Z9841 Cataract extraction status, right eye: Secondary | ICD-10-CM

## 2018-01-19 DIAGNOSIS — I739 Peripheral vascular disease, unspecified: Secondary | ICD-10-CM | POA: Diagnosis present

## 2018-01-19 DIAGNOSIS — N400 Enlarged prostate without lower urinary tract symptoms: Secondary | ICD-10-CM | POA: Diagnosis present

## 2018-01-19 DIAGNOSIS — Z961 Presence of intraocular lens: Secondary | ICD-10-CM | POA: Diagnosis present

## 2018-01-19 DIAGNOSIS — R600 Localized edema: Secondary | ICD-10-CM | POA: Diagnosis present

## 2018-01-19 DIAGNOSIS — Z906 Acquired absence of other parts of urinary tract: Secondary | ICD-10-CM

## 2018-01-19 DIAGNOSIS — M316 Other giant cell arteritis: Secondary | ICD-10-CM | POA: Diagnosis present

## 2018-01-19 DIAGNOSIS — K219 Gastro-esophageal reflux disease without esophagitis: Secondary | ICD-10-CM | POA: Diagnosis present

## 2018-01-19 DIAGNOSIS — I5033 Acute on chronic diastolic (congestive) heart failure: Secondary | ICD-10-CM | POA: Diagnosis present

## 2018-01-19 DIAGNOSIS — Z8052 Family history of malignant neoplasm of bladder: Secondary | ICD-10-CM

## 2018-01-19 DIAGNOSIS — R7989 Other specified abnormal findings of blood chemistry: Secondary | ICD-10-CM | POA: Diagnosis present

## 2018-01-19 DIAGNOSIS — E872 Acidosis, unspecified: Secondary | ICD-10-CM

## 2018-01-19 DIAGNOSIS — Z9842 Cataract extraction status, left eye: Secondary | ICD-10-CM | POA: Diagnosis not present

## 2018-01-19 DIAGNOSIS — I361 Nonrheumatic tricuspid (valve) insufficiency: Secondary | ICD-10-CM | POA: Diagnosis not present

## 2018-01-19 DIAGNOSIS — Z87442 Personal history of urinary calculi: Secondary | ICD-10-CM

## 2018-01-19 DIAGNOSIS — Z79899 Other long term (current) drug therapy: Secondary | ICD-10-CM

## 2018-01-19 DIAGNOSIS — E876 Hypokalemia: Secondary | ICD-10-CM | POA: Diagnosis not present

## 2018-01-19 DIAGNOSIS — R0602 Shortness of breath: Secondary | ICD-10-CM | POA: Diagnosis present

## 2018-01-19 DIAGNOSIS — N184 Chronic kidney disease, stage 4 (severe): Secondary | ICD-10-CM | POA: Diagnosis present

## 2018-01-19 DIAGNOSIS — I130 Hypertensive heart and chronic kidney disease with heart failure and stage 1 through stage 4 chronic kidney disease, or unspecified chronic kidney disease: Secondary | ICD-10-CM | POA: Diagnosis not present

## 2018-01-19 DIAGNOSIS — I878 Other specified disorders of veins: Secondary | ICD-10-CM | POA: Diagnosis present

## 2018-01-19 DIAGNOSIS — Z8711 Personal history of peptic ulcer disease: Secondary | ICD-10-CM

## 2018-01-19 DIAGNOSIS — R739 Hyperglycemia, unspecified: Secondary | ICD-10-CM | POA: Diagnosis present

## 2018-01-19 DIAGNOSIS — M79606 Pain in leg, unspecified: Secondary | ICD-10-CM | POA: Diagnosis present

## 2018-01-19 DIAGNOSIS — R05 Cough: Secondary | ICD-10-CM | POA: Diagnosis not present

## 2018-01-19 DIAGNOSIS — Z66 Do not resuscitate: Secondary | ICD-10-CM | POA: Diagnosis not present

## 2018-01-19 DIAGNOSIS — H548 Legal blindness, as defined in USA: Secondary | ICD-10-CM | POA: Diagnosis present

## 2018-01-19 DIAGNOSIS — F17200 Nicotine dependence, unspecified, uncomplicated: Secondary | ICD-10-CM | POA: Diagnosis present

## 2018-01-19 DIAGNOSIS — J449 Chronic obstructive pulmonary disease, unspecified: Secondary | ICD-10-CM | POA: Diagnosis present

## 2018-01-19 DIAGNOSIS — Z7982 Long term (current) use of aspirin: Secondary | ICD-10-CM

## 2018-01-19 DIAGNOSIS — Z905 Acquired absence of kidney: Secondary | ICD-10-CM

## 2018-01-19 DIAGNOSIS — R0902 Hypoxemia: Secondary | ICD-10-CM | POA: Diagnosis not present

## 2018-01-19 DIAGNOSIS — N179 Acute kidney failure, unspecified: Secondary | ICD-10-CM | POA: Diagnosis not present

## 2018-01-19 DIAGNOSIS — M7989 Other specified soft tissue disorders: Secondary | ICD-10-CM | POA: Diagnosis not present

## 2018-01-19 DIAGNOSIS — Z7952 Long term (current) use of systemic steroids: Secondary | ICD-10-CM

## 2018-01-19 DIAGNOSIS — Z85528 Personal history of other malignant neoplasm of kidney: Secondary | ICD-10-CM

## 2018-01-19 DIAGNOSIS — M109 Gout, unspecified: Secondary | ICD-10-CM | POA: Diagnosis present

## 2018-01-19 DIAGNOSIS — Z8673 Personal history of transient ischemic attack (TIA), and cerebral infarction without residual deficits: Secondary | ICD-10-CM

## 2018-01-19 DIAGNOSIS — Z800 Family history of malignant neoplasm of digestive organs: Secondary | ICD-10-CM

## 2018-01-19 DIAGNOSIS — Z882 Allergy status to sulfonamides status: Secondary | ICD-10-CM

## 2018-01-19 HISTORY — DX: Cerebral infarction, unspecified: I63.9

## 2018-01-19 HISTORY — DX: Personal history of urinary calculi: Z87.442

## 2018-01-19 HISTORY — DX: Personal history of peptic ulcer disease: Z87.11

## 2018-01-19 HISTORY — DX: Cardiac murmur, unspecified: R01.1

## 2018-01-19 HISTORY — DX: Personal history of other diseases of the digestive system: Z87.19

## 2018-01-19 HISTORY — DX: Unspecified osteoarthritis, unspecified site: M19.90

## 2018-01-19 LAB — CBC WITH DIFFERENTIAL/PLATELET
BASOS ABS: 0 10*3/uL (ref 0.0–0.1)
Basophils Relative: 0 %
EOS PCT: 1 %
Eosinophils Absolute: 0.1 10*3/uL (ref 0.0–0.7)
HEMATOCRIT: 37 % — AB (ref 39.0–52.0)
HEMOGLOBIN: 12.4 g/dL — AB (ref 13.0–17.0)
Lymphocytes Relative: 15 %
Lymphs Abs: 1.5 10*3/uL (ref 0.7–4.0)
MCH: 32.5 pg (ref 26.0–34.0)
MCHC: 33.5 g/dL (ref 30.0–36.0)
MCV: 97.1 fL (ref 78.0–100.0)
MONOS PCT: 4 %
Monocytes Absolute: 0.4 10*3/uL (ref 0.1–1.0)
NEUTROS ABS: 7.7 10*3/uL (ref 1.7–7.7)
Neutrophils Relative %: 80 %
Platelets: 188 10*3/uL (ref 150–400)
RBC: 3.81 MIL/uL — ABNORMAL LOW (ref 4.22–5.81)
RDW: 18.8 % — ABNORMAL HIGH (ref 11.5–15.5)
WBC Morphology: INCREASED
WBC: 9.7 10*3/uL (ref 4.0–10.5)

## 2018-01-19 LAB — BRAIN NATRIURETIC PEPTIDE: B NATRIURETIC PEPTIDE 5: 226 pg/mL — AB (ref 0.0–100.0)

## 2018-01-19 LAB — COMPREHENSIVE METABOLIC PANEL
ALBUMIN: 2.6 g/dL — AB (ref 3.5–5.0)
ALT: 27 U/L (ref 17–63)
ANION GAP: 13 (ref 5–15)
AST: 18 U/L (ref 15–41)
Alkaline Phosphatase: 51 U/L (ref 38–126)
BILIRUBIN TOTAL: 0.8 mg/dL (ref 0.3–1.2)
BUN: 74 mg/dL — AB (ref 6–20)
CHLORIDE: 102 mmol/L (ref 101–111)
CO2: 24 mmol/L (ref 22–32)
Calcium: 8.3 mg/dL — ABNORMAL LOW (ref 8.9–10.3)
Creatinine, Ser: 2.11 mg/dL — ABNORMAL HIGH (ref 0.61–1.24)
GFR calc Af Amer: 30 mL/min — ABNORMAL LOW (ref >60)
GFR calc non Af Amer: 26 mL/min — ABNORMAL LOW (ref >60)
GLUCOSE: 135 mg/dL — AB (ref 65–99)
POTASSIUM: 4.2 mmol/L (ref 3.5–5.1)
SODIUM: 139 mmol/L (ref 135–145)
TOTAL PROTEIN: 5.5 g/dL — AB (ref 6.5–8.1)

## 2018-01-19 LAB — I-STAT CG4 LACTIC ACID, ED
LACTIC ACID, VENOUS: 2.76 mmol/L — AB (ref 0.5–1.9)
Lactic Acid, Venous: 3.91 mmol/L (ref 0.5–1.9)

## 2018-01-19 MED ORDER — ALBUTEROL SULFATE (2.5 MG/3ML) 0.083% IN NEBU: 2.5000 mg | INHALATION_SOLUTION | RESPIRATORY_TRACT | Status: DC | PRN

## 2018-01-19 MED ORDER — SODIUM CHLORIDE 0.9% FLUSH: 3.0000 mL | INTRAVENOUS | Status: DC | PRN

## 2018-01-19 MED ORDER — SODIUM CHLORIDE 0.9 % IV SOLN
1.0000 g | Freq: Once | INTRAVENOUS | Status: AC
  Administered 2018-01-19: 1 g via INTRAVENOUS
  Filled 2018-01-19: qty 10

## 2018-01-19 MED ORDER — PREDNISONE 10 MG PO TABS
10.0000 mg | ORAL_TABLET | Freq: Every day | ORAL | Status: DC
  Administered 2018-01-20 – 2018-01-23 (×4): 10 mg via ORAL
  Filled 2018-01-19 (×4): qty 1

## 2018-01-19 MED ORDER — FUROSEMIDE 10 MG/ML IJ SOLN
40.0000 mg | Freq: Two times a day (BID) | INTRAMUSCULAR | Status: DC
  Administered 2018-01-20 – 2018-01-21 (×3): 40 mg via INTRAVENOUS
  Filled 2018-01-19 (×3): qty 4

## 2018-01-19 MED ORDER — MIRTAZAPINE 15 MG PO TABS
15.0000 mg | ORAL_TABLET | Freq: Every day | ORAL | Status: DC
  Administered 2018-01-19 – 2018-01-22 (×4): 15 mg via ORAL
  Filled 2018-01-19 (×4): qty 1

## 2018-01-19 MED ORDER — ONDANSETRON HCL 4 MG/2ML IJ SOLN: 4.0000 mg | Freq: Four times a day (QID) | INTRAMUSCULAR | Status: DC | PRN

## 2018-01-19 MED ORDER — POLYETHYLENE GLYCOL 3350 17 G PO PACK
17.0000 g | PACK | Freq: Every day | ORAL | Status: DC | PRN
  Administered 2018-01-22: 17 g via ORAL
  Filled 2018-01-19: qty 1

## 2018-01-19 MED ORDER — SODIUM CHLORIDE 0.9 % IV BOLUS: 500.0000 mL | Freq: Once | INTRAVENOUS | Status: DC

## 2018-01-19 MED ORDER — PANTOPRAZOLE SODIUM 40 MG PO TBEC
40.0000 mg | DELAYED_RELEASE_TABLET | Freq: Every day | ORAL | Status: DC
  Administered 2018-01-20 – 2018-01-23 (×4): 40 mg via ORAL
  Filled 2018-01-19 (×4): qty 1

## 2018-01-19 MED ORDER — IPRATROPIUM-ALBUTEROL 0.5-2.5 (3) MG/3ML IN SOLN
3.0000 mL | Freq: Four times a day (QID) | RESPIRATORY_TRACT | Status: DC
  Filled 2018-01-19: qty 3

## 2018-01-19 MED ORDER — PREDNISONE 5 MG PO TABS: 2.5000 mg | ORAL_TABLET | Freq: Two times a day (BID) | ORAL | Status: DC

## 2018-01-19 MED ORDER — SODIUM CHLORIDE 0.9% FLUSH
3.0000 mL | Freq: Two times a day (BID) | INTRAVENOUS | Status: DC
  Administered 2018-01-19 – 2018-01-22 (×7): 3 mL via INTRAVENOUS

## 2018-01-19 MED ORDER — ASPIRIN EC 81 MG PO TBEC
81.0000 mg | DELAYED_RELEASE_TABLET | Freq: Every day | ORAL | Status: DC
  Administered 2018-01-20 – 2018-01-23 (×4): 81 mg via ORAL
  Filled 2018-01-19 (×4): qty 1

## 2018-01-19 MED ORDER — ALLOPURINOL 100 MG PO TABS
100.0000 mg | ORAL_TABLET | Freq: Every day | ORAL | Status: DC
  Administered 2018-01-20 – 2018-01-23 (×4): 100 mg via ORAL
  Filled 2018-01-19 (×4): qty 1

## 2018-01-19 MED ORDER — PRAVASTATIN SODIUM 40 MG PO TABS
40.0000 mg | ORAL_TABLET | Freq: Every evening | ORAL | Status: DC
  Administered 2018-01-19 – 2018-01-22 (×4): 40 mg via ORAL
  Filled 2018-01-19 (×4): qty 1

## 2018-01-19 MED ORDER — METHYLPREDNISOLONE SODIUM SUCC 125 MG IJ SOLR
125.0000 mg | Freq: Once | INTRAMUSCULAR | Status: AC
Start: 2018-01-19 — End: 2018-01-19
  Administered 2018-01-19: 125 mg via INTRAVENOUS
  Filled 2018-01-19: qty 2

## 2018-01-19 MED ORDER — SODIUM CHLORIDE 0.9 % IV SOLN: 250.0000 mL | INTRAVENOUS | Status: DC | PRN

## 2018-01-19 MED ORDER — ORAL CARE MOUTH RINSE
15.0000 mL | Freq: Two times a day (BID) | OROMUCOSAL | Status: DC
  Administered 2018-01-19 – 2018-01-22 (×7): 15 mL via OROMUCOSAL

## 2018-01-19 MED ORDER — IPRATROPIUM BROMIDE 0.02 % IN SOLN
0.5000 mg | Freq: Once | RESPIRATORY_TRACT | Status: AC
  Administered 2018-01-19: 0.5 mg via RESPIRATORY_TRACT
  Filled 2018-01-19: qty 2.5

## 2018-01-19 MED ORDER — FUROSEMIDE 10 MG/ML IJ SOLN
40.0000 mg | Freq: Once | INTRAMUSCULAR | Status: AC
  Administered 2018-01-19: 40 mg via INTRAVENOUS
  Filled 2018-01-19: qty 4

## 2018-01-19 MED ORDER — SODIUM CHLORIDE 0.9 % IV SOLN
500.0000 mg | Freq: Once | INTRAVENOUS | Status: AC
  Administered 2018-01-19: 500 mg via INTRAVENOUS
  Filled 2018-01-19: qty 500

## 2018-01-19 MED ORDER — ALBUTEROL SULFATE (2.5 MG/3ML) 0.083% IN NEBU
5.0000 mg | INHALATION_SOLUTION | Freq: Once | RESPIRATORY_TRACT | Status: AC
  Administered 2018-01-19: 5 mg via RESPIRATORY_TRACT
  Filled 2018-01-19: qty 6

## 2018-01-19 MED ORDER — ENOXAPARIN SODIUM 30 MG/0.3ML ~~LOC~~ SOLN
30.0000 mg | SUBCUTANEOUS | Status: DC
  Administered 2018-01-19 – 2018-01-22 (×4): 30 mg via SUBCUTANEOUS
  Filled 2018-01-19 (×4): qty 0.3

## 2018-01-19 MED ORDER — PREDNISOLONE 5 MG PO TABS
2.5000 mg | ORAL_TABLET | Freq: Every evening | ORAL | Status: DC
  Administered 2018-01-19 – 2018-01-22 (×4): 2.5 mg via ORAL
  Filled 2018-01-19 (×5): qty 1

## 2018-01-19 MED ORDER — ACETAMINOPHEN 325 MG PO TABS: 650.0000 mg | ORAL_TABLET | ORAL | Status: DC | PRN

## 2018-01-19 NOTE — Progress Notes (Signed)
Preliminary results by tech - Right lower ext. Venous duplex completed. Negative for DVT. Oda Cogan, BS, RDMS, RVT

## 2018-01-19 NOTE — ED Notes (Signed)
CG-4 reported to [place] at [facility]

## 2018-01-19 NOTE — Progress Notes (Addendum)
   01/19/18 1827  Vitals  BP 120/60  MAP (mmHg) 72  Pulse Rate 81  Cardiac Rhythm Atrial fibrillation  Oxygen Therapy  SpO2 93 %  O2 Device Nasal Cannula  O2 Flow Rate (L/min) 2 L/min  Pain Assessment  Pain Scale 0-10  Pain Score 0  Note  Observations Pt arrived to 2W    Pt arrived to floor from ED, pt placed on tele monitor, VSS, no family available for admission questions at this time.

## 2018-01-19 NOTE — ED Triage Notes (Addendum)
Pt was sent by MD yesterday for DVT study to right lower leg. Pt did not complete triage due to wait time, did not make it back to triage. Pt arrives today for same, swelling noted to right leg with edema noted. Pt also has cough noted with moderate mucous. Pt has a chest XRAY at doctors office. Copy brought with them. Pt family states his cough has gotten a lot worse and they are concerned for pneumonia.

## 2018-01-19 NOTE — ED Provider Notes (Signed)
[facility] EMERGENCY DEPARTMENT Provider Note   CSN: [id number] Arrival date & time: 01/19/18  0944     History   Chief Complaint Chief Complaint  Patient presents with  . Leg Pain    HPI [name] is a 84 y.o. male history of COPD, hyperlipidemia, hypertension, aortic stenosis here presenting with leg swelling, shortness of breath, cough, chills.  Patient has been having chills for the last week or so.  Patient has been having nonproductive cough as well.  Patient states that his legs has been more swollen especially on the right side.  Patient had a wellness visit about a week ago and was felt that he was baseline.  He went back to see his doctor yesterday and had a chest x-ray done but did not know the results.  Patient is concerned that he may have pneumonia.   The history is provided by the patient.    Past Medical History:  Diagnosis Date  . ALLERGIC RHINITIS 09/29/2007  . Aortic stenosis 02/05/2017  . BACK PAIN 01/31/2009  . BENIGN PROSTATIC HYPERTROPHY 05/12/2007  . CHEST PAIN 01/31/2008  . CHRONIC OBSTRUCTIVE PULMONARY DISEASE, ACUTE EXACERBATION 08/17/2007  . CONJUNCTIVITIS, ALLERGIC, CHRONIC 03/24/2010  . CONSTIPATION 02/19/2010  . COPD 05/12/2007  . GERD 02/23/2010  . GOUT 07/04/2010  . History of CVA (cerebrovascular accident) 05/03/2011  . HYPERLIPIDEMIA 05/12/2007  . HYPERTENSION 05/12/2007  . NEPHROLITHIASIS, HX OF 08/17/2007  . OBESITY 05/12/2007  . PEPTIC ULCER DISEASE 05/12/2007  . PERIPHERAL VASCULAR DISEASE 05/12/2007  . Personal history of unspecified circulatory disease 05/12/2007  . PLANTAR FASCIITIS, RIGHT 09/27/2008  . RENAL CELL CANCER 05/12/2007  . RENAL INSUFFICIENCY 08/17/2007  . SKIN LESION 11/07/2010  . VERTIGO 09/29/2007  . WRIST PAIN, RIGHT 02/26/2009    Patient Active Problem List   Diagnosis Date Noted  . Hyponatremia 11/22/2017  . Peripheral edema 11/08/2017  . Thrush 11/08/2017  . Temporal arteritis ([place]) 11/03/2017  .  Vision loss, bilateral 10/26/2017  . Weight loss 10/18/2017  . Flank pain 10/18/2017  . Right arm pain 10/18/2017  . Depression 10/18/2017  . Aortic stenosis 02/05/2017  . Heart murmur 01/05/2017  . Skin lesion 01/04/2016  . Low back pain 12/21/2013  . Lumbar radiculitis 12/28/2012  . Smoker unmotivated to quit 11/14/2012  . Cough 11/06/2011  . Leukocytosis 11/06/2011  . History of CVA (cerebrovascular accident) 05/03/2011  . Preventative health care 05/03/2011  . SKIN LESION 11/07/2010  . GOUT 07/04/2010  . CONJUNCTIVITIS, ALLERGIC, CHRONIC 03/24/2010  . GERD 02/23/2010  . Constipation 02/19/2010  . WRIST PAIN, RIGHT 02/26/2009  . BACK PAIN 01/31/2009  . PLANTAR FASCIITIS, RIGHT 09/27/2008  . CHEST PAIN 01/31/2008  . ALLERGIC RHINITIS 09/29/2007  . VERTIGO 09/29/2007  . CKD (chronic kidney disease), stage III ([place]) 08/17/2007  . NEPHROLITHIASIS, HX OF 08/17/2007  . Malignant neoplasm of kidney excluding renal pelvis ([place]) 05/12/2007  . Hyperlipidemia 05/12/2007  . OBESITY 05/12/2007  . Essential hypertension 05/12/2007  . PERIPHERAL VASCULAR DISEASE 05/12/2007  . COPD (chronic obstructive pulmonary disease) ([place]) 05/12/2007  . PEPTIC ULCER DISEASE 05/12/2007  . BENIGN PROSTATIC HYPERTROPHY 05/12/2007    Past Surgical History:  Procedure Laterality Date  . ARTERY BIOPSY Bilateral 10/27/2017   Procedure: BILATERAL TEMPORAL ARTERY BIOPSY;  Surgeon: Serafina Mitchell, MD;  Location: [facility] OR;  Service: Vascular;  Laterality: Bilateral;  . CATARACT EXTRACTION    . ear surgury    . NEPHRECTOMY    . NEPHROURETERECTOMY    .  s.p left knee replacement  10/08  . s/p bilat fem pop bypass          Home Medications    Prior to Admission medications   Medication Sig Start Date End Date Taking? Authorizing Provider  allopurinol (ZYLOPRIM) 100 MG tablet TAKE 1 TABLET EVERY DAY 12/14/17  Yes [name], MD  amLODipine (NORVASC) 5 MG tablet TAKE 1 TABLET (5 MG TOTAL) BY MOUTH  DAILY. 12/14/17  Yes [name], MD  aspirin EC 81 MG tablet Take 1 tablet (81 mg total) by mouth daily. 02/16/17  Yes Camnitz, Will Hassell Done, MD  cetirizine (ZYRTEC) 10 MG tablet Take 10 mg by mouth daily as needed for allergies.    Yes [provider]  docusate sodium (COLACE) 100 MG capsule Take 300 mg by mouth daily as needed for mild constipation.   Yes [provider]  furosemide (LASIX) 20 MG tablet Take 20 mg by mouth daily.   Yes [provider]  guaiFENesin (MUCINEX) 600 MG 12 hr tablet Take 600 mg by mouth 2 (two) times daily as needed for cough or to loosen phlegm.   Yes [provider]  mirtazapine (REMERON) 15 MG tablet Take 1 tablet (15 mg total) by mouth at bedtime. 10/18/17  Yes [name], MD  omeprazole (PRILOSEC) 40 MG capsule Take 1 capsule (40 mg total) by mouth daily. 10/28/17 01/19/18 Yes Reyne Dumas, MD  polyethylene glycol (MIRALAX / GLYCOLAX) packet Take 17 g by mouth daily as needed for mild constipation.   Yes [provider]  pravastatin (PRAVACHOL) 40 MG tablet TAKE 1 TABLET EVERY EVENING Patient taking differently: Take 40 mg by mouth in the evening 11/17/17  Yes [name], MD  predniSONE (DELTASONE) 5 MG tablet 10 mg in the morning and 2.5mg  at night. 01/03/18  Yes [provider]  ACTEMRA 162 MG/0.9ML SOSY Inject 0.9 mLs into the skin once. AS DIRECTED 11/22/17   [provider]  citalopram (CELEXA) 10 MG tablet Take 1 tablet (10 mg total) by mouth daily. Patient not taking: Reported on 01/19/2018 12/06/17 12/06/18  [name], MD  furosemide (LASIX) 40 MG tablet 1 tab by mouth on Mon/Wed/Fri/Sat only Patient not taking: Reported on 01/19/2018 11/22/17   [name], MD  omeprazole (PRILOSEC) 20 MG capsule TAKE 1 CAPSULE EVERY DAY Patient not taking: Reported on 01/19/2018 11/15/17   [name], MD    Family History Family History  Problem Relation Age of Onset  . Cancer Mother        colon  cancer  . Heart disease Brother   . Alcohol abuse Brother   . Cancer Daughter        bladder cancer  . Cancer Other        grandson leukemia and BMT  . Cancer Other        sibling with pancreatic cancer    Social History Social History   Tobacco Use  . Smoking status: Current Every Day Smoker  . Smokeless tobacco: Never Used  Substance Use Topics  . Alcohol use: No  . Drug use: No     Allergies   Sulfonamide derivatives   Review of Systems Review of Systems  Respiratory: Positive for cough and shortness of breath.   All other systems reviewed and are negative.    Physical Exam Updated Vital Signs BP 131/76   Pulse 96   Temp 98.5 F (36.9 C) (Oral)   Resp 16  Ht 5\' 5"  (1.651 m)   Wt 74.8 kg (165 lb)   SpO2 (!) 89%   BMI 27.46 kg/m   Physical Exam  Constitutional: He is oriented to person, place, and time.  Chronically ill, slightly tachypneic   HENT:  Head: Normocephalic.  Mouth/Throat: Oropharynx is clear and moist.  Eyes: Pupils are equal, round, and reactive to light. Conjunctivae and EOM are normal.  Neck: Normal range of motion. Neck supple.  Cardiovascular: Normal rate, regular rhythm and normal heart sounds.  Pulmonary/Chest:  Crackles R base, minimal wheezing   Abdominal: Soft. Bowel sounds are normal. He exhibits no distension. There is no tenderness. There is no guarding.  Musculoskeletal: Normal range of motion.  2+ edema R leg, mild R calf tenderness. 1+ edema L leg, no calf tenderness   Neurological: He is alert and oriented to person, place, and time.  Skin: Skin is warm.  Psychiatric: He has a normal mood and affect.  Nursing note and vitals reviewed.    ED Treatments / Results  Labs (all labs ordered are listed, but only abnormal results are displayed) Labs Reviewed  COMPREHENSIVE METABOLIC PANEL - Abnormal; Notable for the following components:      Result Value   Glucose, Bld 135 (*)    BUN 74 (*)    Creatinine, Ser 2.11  (*)    Calcium 8.3 (*)    Total Protein 5.5 (*)    Albumin 2.6 (*)    GFR calc non Af Amer 26 (*)    GFR calc Af Amer 30 (*)    All other components within normal limits  CBC WITH DIFFERENTIAL/PLATELET - Abnormal; Notable for the following components:   RBC 3.81 (*)    Hemoglobin 12.4 (*)    HCT 37.0 (*)    RDW 18.8 (*)    All other components within normal limits  BRAIN NATRIURETIC PEPTIDE - Abnormal; Notable for the following components:   B Natriuretic Peptide 226.0 (*)    All other components within normal limits  I-STAT CG4 LACTIC ACID, ED - Abnormal; Notable for the following components:   Lactic Acid, Venous 2.76 (*)    All other components within normal limits  I-STAT CG4 LACTIC ACID, ED - Abnormal; Notable for the following components:   Lactic Acid, Venous 3.91 (*)    All other components within normal limits  CULTURE, BLOOD (ROUTINE X 2)  CULTURE, BLOOD (ROUTINE X 2)    EKG None  Radiology Dg Chest 2 View  Result Date: 01/19/2018 CLINICAL DATA:  Cough and pneumonia EXAM: CHEST - 2 VIEW COMPARISON:  11/26/2017 FINDINGS: Increased bilateral interstitial opacities consistent with mild interstitial pulmonary edema. No pleural effusion or pneumothorax. No focal consolidation. Cardiomediastinal contours are normal. Calcific aortic atherosclerosis is unchanged. IMPRESSION: Mild interstitial pulmonary edema. Electronically Signed   By: Ulyses Jarred M.D.   On: 01/19/2018 13:55    Procedures Procedures (including critical care time)  Medications Ordered in ED Medications  methylPREDNISolone sodium succinate (SOLU-MEDROL) 125 mg/2 mL injection 125 mg (has no administration in time range)  albuterol (PROVENTIL) (2.5 MG/3ML) 0.083% nebulizer solution 5 mg (5 mg Nebulization Given 01/19/18 1237)  ipratropium (ATROVENT) nebulizer solution 0.5 mg (0.5 mg Nebulization Given 01/19/18 1237)  cefTRIAXone (ROCEPHIN) 1 g in sodium chloride 0.9 % 100 mL IVPB (0 g Intravenous Stopped  01/19/18 1345)  azithromycin (ZITHROMAX) 500 mg in sodium chloride 0.9 % 250 mL IVPB (500 mg Intravenous New Bag/Given 01/19/18 1443)     Initial Impression /  Assessment and Plan / ED Course  I have reviewed the triage vital signs and the nursing notes.  Pertinent labs & imaging results that were available during my care of the patient were reviewed by me and considered in my medical decision making (see chart for details).     NITISH ROES is a 84 y.o. male here with SOB, chills, cough. I suspect early pneumonia vs CHF. Consider DVT of R leg as well. Will get labs, BNP, cultures, lactate, CXR. Will get DVT study of R leg.    4:06 PM Lactate was 2.7 initially, went up to 3.9. CXR showed possible pulmonary edema. DVT study neg. BNP decreased to 260. O2 dropped to 89%. I think his hypoxia and SOB is multifactorial. Likely early pneumonia vs COPD. Given abx, steroids. I ordered NS bolus but hospitalist wants to hold off and assess the patient first. I held off on diuresis given lactic acidosis and decreased BNP.   Final Clinical Impressions(s) / ED Diagnoses   Final diagnoses:  None    ED Discharge Orders    None       Drenda Freeze, MD 01/19/18 [phone]

## 2018-01-19 NOTE — ED Notes (Signed)
Venous US tech at bedside

## 2018-01-19 NOTE — ED Notes (Signed)
Pt's O2 sats 88%, pt placed on 2L [place].

## 2018-01-19 NOTE — H&P (Signed)
History and Physical    [name]:[id number] DOB: 1925/08/09 DOA: 01/19/2018  PCP: Kirk Borg, MD Consultants:  Posey Pronto - nephrology; Pearline Cables - rheumatology; Curt Bears - EP Patient coming from:  Home - lives with wife, daughter; Donald Prose: wife, [phone]  Chief Complaint:  SOB  HPI: [name] is a 84 y.o. male with medical history significant of renal cell CA; CKD; PVD; HTN; COPD; HLD; h/o CVA; and BPH presenting with leg pain. Back in March, he had temporal arteritis (given steroids) and he has resultant right eye blindness and mostly blindness in the left eye (he can see a silhouette but can no longer see the TV, read, etc).  He uses a urinal or potty chair because he can't walk.  He started having pain in his right leg 2-3 days ago.  His leg started weeping again.  He also has a cough.  They have been cutting back on his steroids because he has been swelling and this led to improvement in the swelling and weeping.  He had a CXR yesterday and "they weren't sure what it was."  It really is just the right leg that is painful, although they have been both swollen.  They deny h/o CHF.  He does have aortic stenosis.  The cough is persistent but has worsened for the last few days.  Cough has been productive but they aren't sure what it looks like.  He has not been able to spit anything up for a couple of days but he usually spits the sputum into his urinal with his urine and his wife pours it down the drain.  No fevers at home.  No rhinorrhea or congestion.  Last week, the PCP diagnosed an ear infection and put some drops in it but he did not need a prescription.   ED Course:  Leg pain/swelling, SOB.  Sepsis w/u - lactate 2.7, 3.9.  ?infection.  CXR with edema, R base crackles.  DVT US negative.  BNP is down, lactate up, given bolus.  Given Rocephin/azithro.  Sats just went down.  Now with crackles so given steroids.  Review of Systems: As per HPI; otherwise review of systems reviewed and  negative.   Ambulatory Status:  He was walking with a walker some but is currently unable to get out of bed at all  Past Medical History:  Diagnosis Date  . ALLERGIC RHINITIS 09/29/2007  . Aortic stenosis 02/05/2017  . BACK PAIN 01/31/2009  . BENIGN PROSTATIC HYPERTROPHY 05/12/2007  . CHEST PAIN 01/31/2008  . CHRONIC OBSTRUCTIVE PULMONARY DISEASE, ACUTE EXACERBATION 08/17/2007  . CONJUNCTIVITIS, ALLERGIC, CHRONIC 03/24/2010  . CONSTIPATION 02/19/2010  . COPD 05/12/2007  . GERD 02/23/2010  . GOUT 07/04/2010  . History of CVA (cerebrovascular accident) 05/03/2011  . HYPERLIPIDEMIA 05/12/2007  . HYPERTENSION 05/12/2007  . NEPHROLITHIASIS, HX OF 08/17/2007  . OBESITY 05/12/2007  . PEPTIC ULCER DISEASE 05/12/2007  . PERIPHERAL VASCULAR DISEASE 05/12/2007  . Personal history of unspecified circulatory disease 05/12/2007  . PLANTAR FASCIITIS, RIGHT 09/27/2008  . RENAL CELL CANCER 05/12/2007  . RENAL INSUFFICIENCY 08/17/2007  . SKIN LESION 11/07/2010  . VERTIGO 09/29/2007  . WRIST PAIN, RIGHT 02/26/2009    Past Surgical History:  Procedure Laterality Date  . ARTERY BIOPSY Bilateral 10/27/2017   Procedure: BILATERAL TEMPORAL ARTERY BIOPSY;  Surgeon: Serafina Mitchell, MD;  Location: [facility] OR;  Service: Vascular;  Laterality: Bilateral;  . CATARACT EXTRACTION    . ear surgury    . NEPHRECTOMY    .  NEPHROURETERECTOMY    . s.p left knee replacement  10/08  . s/p bilat fem pop bypass      Social History   Socioeconomic History  . Marital status: Married    Spouse name: Not on file  . Number of children: 4  . Years of education: 5  . Highest education level: Not on file  Occupational History  . Occupation: retired Solicitor: RETIRED  Social Needs  . Financial resource strain: Not on file  . Food insecurity:    Worry: Not on file    Inability: Not on file  . Transportation needs:    Medical: Not on file    Non-medical: Not on file  Tobacco Use  . Smoking status: Current Every Day Smoker      Packs/day: 1.00    Years: 75.00    Pack years: 75.00    Types: Cigarettes  . Smokeless tobacco: Never Used  Substance and Sexual Activity  . Alcohol use: No  . Drug use: No  . Sexual activity: Not on file  Lifestyle  . Physical activity:    Days per week: Not on file    Minutes per session: Not on file  . Stress: Not on file  Relationships  . Social connections:    Talks on phone: Not on file    Gets together: Not on file    Attends religious service: Not on file    Active member of club or organization: Not on file    Attends meetings of clubs or organizations: Not on file    Relationship status: Not on file  . Intimate partner violence:    Fear of current or ex partner: Not on file    Emotionally abused: Not on file    Physically abused: Not on file    Forced sexual activity: Not on file  Other Topics Concern  . Not on file  Social History Narrative   Married for 71 yrs as of June 2019. Met in a bowling alley.     Allergies  Allergen Reactions  . Sulfonamide Derivatives Rash    Family History  Problem Relation Age of Onset  . Cancer Mother        colon cancer  . Heart disease Brother   . Alcohol abuse Brother   . Cancer Daughter        bladder cancer  . Cancer Other        grandson leukemia and BMT  . Cancer Other        sibling with pancreatic cancer    Prior to Admission medications   Medication Sig Start Date End Date Taking? Authorizing Provider  allopurinol (ZYLOPRIM) 100 MG tablet TAKE 1 TABLET EVERY DAY 12/14/17  Yes Kirk Borg, MD  amLODipine (NORVASC) 5 MG tablet TAKE 1 TABLET (5 MG TOTAL) BY MOUTH DAILY. 12/14/17  Yes Kirk Borg, MD  aspirin EC 81 MG tablet Take 1 tablet (81 mg total) by mouth daily. 02/16/17  Yes Camnitz, Will Hassell Done, MD  cetirizine (ZYRTEC) 10 MG tablet Take 10 mg by mouth daily as needed for allergies.    Yes [provider]  docusate sodium (COLACE) 100 MG capsule Take 300 mg by mouth daily as needed for mild  constipation.   Yes [provider]  furosemide (LASIX) 20 MG tablet Take 20 mg by mouth daily.   Yes [provider]  guaiFENesin (MUCINEX) 600 MG 12 hr tablet Take 600 mg by mouth 2 (  two) times daily as needed for cough or to loosen phlegm.   Yes [provider]  mirtazapine (REMERON) 15 MG tablet Take 1 tablet (15 mg total) by mouth at bedtime. 10/18/17  Yes Kirk Borg, MD  omeprazole (PRILOSEC) 40 MG capsule Take 1 capsule (40 mg total) by mouth daily. 10/28/17 01/19/18 Yes Reyne Dumas, MD  polyethylene glycol (MIRALAX / GLYCOLAX) packet Take 17 g by mouth daily as needed for mild constipation.   Yes [provider]  pravastatin (PRAVACHOL) 40 MG tablet TAKE 1 TABLET EVERY EVENING Patient taking differently: Take 40 mg by mouth in the evening 11/17/17  Yes Kirk Borg, MD  predniSONE (DELTASONE) 5 MG tablet 10 mg in the morning and 2.45m at night. 01/03/18  Yes [provider]  ACTEMRA 162 MG/0.9ML SOSY Inject 0.9 mLs into the skin once. AS DIRECTED 11/22/17   [provider]  citalopram (CELEXA) 10 MG tablet Take 1 tablet (10 mg total) by mouth daily. Patient not taking: Reported on 01/19/2018 12/06/17 12/06/18  JBiagio Borg MD  furosemide (LASIX) 40 MG tablet 1 tab by mouth on Mon/Wed/Fri/Sat only Patient not taking: Reported on 01/19/2018 11/22/17   JBiagio Borg MD  omeprazole (PRILOSEC) 20 MG capsule TAKE 1 CAPSULE EVERY DAY Patient not taking: Reported on 01/19/2018 11/15/17   JBiagio Borg MD    Physical Exam: Vitals:   01/19/18 1315 01/19/18 1345 01/19/18 1606 01/19/18 1800  BP: 116/62 131/76 130/68 (!) 137/54  Pulse: 97 96 (!) 50 83  Resp:   18 (!) 30  Temp:      TempSrc:      SpO2: 91% (!) 89% 92% 91%  Weight:      Height:         General:  He was initially lying under the blanket completely, with his head covered.  After removing it, he is alert and not in significant distress. Eyes:  Does not track or make eye  contact ENT:  Hard of hearing, normal lips & tongue, mmm Neck:  no LAD, masses or thyromegaly; no carotid bruits.  +JVD Cardiovascular:  Mild tachycardia.  Respiratory:  Bibasilar crackles extending 1/3 up the lung fields.  Mildly increased respiratory effort. Abdomen:  soft, NT, ND, NABS Skin:  Erythema of the RLE with marked edema (see below).  There are small superficial ulcerations with weeping. Musculoskeletal: marked LE R > L edema with some atrophy.   Psychiatric: flat mood and affect, speech fluent and appropriate, AOx3    Radiological Exams on Admission: Dg Chest 2 View  Result Date: 01/19/2018 CLINICAL DATA:  Cough and pneumonia EXAM: CHEST - 2 VIEW COMPARISON:  11/26/2017 FINDINGS: Increased bilateral interstitial opacities consistent with mild interstitial pulmonary edema. No pleural effusion or pneumothorax. No focal consolidation. Cardiomediastinal contours are normal. Calcific aortic atherosclerosis is unchanged. IMPRESSION: Mild interstitial pulmonary edema. Electronically Signed   By: KUlyses JarredM.D.   On: 01/19/2018 13:55    EKG: pending   Labs on Admission: I have personally reviewed the available labs and imaging studies at the time of the admission.  Pertinent labs:   Glucose 135 BUN 74/Creatinine 2.11/GFR 26; prior 60/1.69 on 4/5 Lactate 2.76, 3.91 BNP 226; prior 594 on 3/30 WBC 9.7 Hgb 12.4 Blood cultures pending  Assessment/Plan Principal Problem:   SOB (shortness of breath) Active Problems:   Hyperlipidemia   Essential hypertension   COPD ([place] obstructive [place] disease) ([place]   Smoker unmotivated to quit   Temporal arteritis (  [place])   Bilateral lower extremity edema   Acute kidney injury superimposed on CKD ([facility])   Elevated lactic acid level   Hyperglycemia   SOB -Patient presenting with marked LE edema, SOB, JVD, crackles on exam, and pulmonary edema on CXR - this appears most likely related to CHF exacerbation. -The ER physician  was more concerned initially about PNA vs COPD and ordered IVF bolus. -Bolus was held and the patient was given Lasix in the ER, 40 mg IV. -Normal WBC count, no reported fever or apparent infectious symptoms; will not give additional antibiotics at this time -Elevated BNP, although less so than in 3/19 -EKG pending -With mildly elevated BNP and abnl CXR, CHF seems most probable as diagnosis -Will admit with telemetry -Will request repeat echocardiogram -unable to visualize diastolic function on prior study so will try again -No ACE due to renal failure -No beta blocker due to decompensation on presentation -CHF order set utilized; may need CHF team consult but will hold until Echo results are available -Was given Lasix 40 mg x 1 in ER and will repeat with 40 mg IV BID at this time -Continue [place] O2 for now -Will r/o with serial troponins although doubt ACS based on symptoms   B LE edema -His edema appears to be related to stasis and/or steroids and probable CHF -Diurese and follow -He does not appear to have cellulitis but rather stasis -Will follow  Elevated lactate -Need to consider alternate causes other than sepsis for elevated lactate -While infection is a consideration, his evaluation at this time does not indicate that this is the case -Treat the lactic and follow with treatment for CHF -His renal failure could be driving the lactate  AKI on CKD -Likely diminished renal perfusion in the setting of CHF exacerbation -Diurese and follow  COPD, still smoking -Does not appear to be having significant wheezing -Increased WOB -On chronic steroids so increasing steroids does not appear to be necessary at this time -Continue nebs -Encourage smoking cessation - although, realistically, at 40 with an ongoing will to smoke, this may be a losing battle  HTN -Hold Norvasc and consider discontinuation based on edema -May benefit from beta blocker therapy when CHF is  controlled  Hyperglycemia -A1c is 5.5 -May be stress response, but likely related to chronic prednisone -Will follow with fasting AM labs -It is unlikely that he will need acute or chronic treatment for this issue  Temporal arteritis -Continue steroids -He is, unfortunately, blind as a consequence  DVT prophylaxis:  Lovenox Code Status:  DNR - confirmed with patient/family Family Communication: Wife, son present throughout evaluation  Disposition Plan:  Home once clinically improved Consults called: CM; PT  Admission status: Admit - It is my clinical opinion that admission to INPATIENT is reasonable and necessary because of the expectation that this patient will require hospital care that crosses at least 2 midnights to treat this condition based on the medical complexity of the problems presented.  Given the aforementioned information, the predictability of an adverse outcome is felt to be significant.     Karmen Bongo MD [facility]  If note is complete, please contact covering daytime or nighttime physician. [email or web address] Password [facility]  01/19/2018, 6:19 PM

## 2018-01-20 ENCOUNTER — Inpatient Hospital Stay (HOSPITAL_COMMUNITY): Payer: Medicare HMO

## 2018-01-20 DIAGNOSIS — R600 Localized edema: Secondary | ICD-10-CM

## 2018-01-20 DIAGNOSIS — J449 Chronic obstructive pulmonary disease, unspecified: Secondary | ICD-10-CM

## 2018-01-20 DIAGNOSIS — N189 Chronic kidney disease, unspecified: Secondary | ICD-10-CM

## 2018-01-20 DIAGNOSIS — M316 Other giant cell arteritis: Secondary | ICD-10-CM

## 2018-01-20 DIAGNOSIS — I361 Nonrheumatic tricuspid (valve) insufficiency: Secondary | ICD-10-CM

## 2018-01-20 DIAGNOSIS — L8990 Pressure ulcer of unspecified site, unspecified stage: Secondary | ICD-10-CM

## 2018-01-20 DIAGNOSIS — N179 Acute kidney failure, unspecified: Secondary | ICD-10-CM

## 2018-01-20 LAB — BASIC METABOLIC PANEL
ANION GAP: 11 (ref 5–15)
BUN: 75 mg/dL — ABNORMAL HIGH (ref 6–20)
CHLORIDE: 102 mmol/L (ref 101–111)
CO2: 25 mmol/L (ref 22–32)
Calcium: 8 mg/dL — ABNORMAL LOW (ref 8.9–10.3)
Creatinine, Ser: 2.21 mg/dL — ABNORMAL HIGH (ref 0.61–1.24)
GFR calc non Af Amer: 24 mL/min — ABNORMAL LOW (ref >60)
GFR, EST AFRICAN AMERICAN: 28 mL/min — AB (ref >60)
Glucose, Bld: 232 mg/dL — ABNORMAL HIGH (ref 65–99)
Potassium: 4.9 mmol/L (ref 3.5–5.1)
Sodium: 138 mmol/L (ref 135–145)

## 2018-01-20 LAB — CBC WITH DIFFERENTIAL/PLATELET
Basophils Absolute: 0.1 10*3/uL (ref 0.0–0.1)
Basophils Relative: 1 %
EOS PCT: 0 %
Eosinophils Absolute: 0 10*3/uL (ref 0.0–0.7)
HEMATOCRIT: 33.7 % — AB (ref 39.0–52.0)
HEMOGLOBIN: 11.3 g/dL — AB (ref 13.0–17.0)
Lymphocytes Relative: 9 %
Lymphs Abs: 0.9 10*3/uL (ref 0.7–4.0)
MCH: 32.6 pg (ref 26.0–34.0)
MCHC: 33.5 g/dL (ref 30.0–36.0)
MCV: 97.1 fL (ref 78.0–100.0)
MONOS PCT: 2 %
Monocytes Absolute: 0.2 10*3/uL (ref 0.1–1.0)
Neutro Abs: 8.7 10*3/uL — ABNORMAL HIGH (ref 1.7–7.7)
Neutrophils Relative %: 88 %
Platelets: 181 10*3/uL (ref 150–400)
RBC: 3.47 MIL/uL — AB (ref 4.22–5.81)
RDW: 18.7 % — ABNORMAL HIGH (ref 11.5–15.5)
WBC: 9.9 10*3/uL (ref 4.0–10.5)

## 2018-01-20 LAB — ECHOCARDIOGRAM COMPLETE
HEIGHTINCHES: 65 in
Weight: 2638.47 oz

## 2018-01-20 MED ORDER — IPRATROPIUM-ALBUTEROL 0.5-2.5 (3) MG/3ML IN SOLN
3.0000 mL | Freq: Three times a day (TID) | RESPIRATORY_TRACT | Status: DC
  Administered 2018-01-20 (×3): 3 mL via RESPIRATORY_TRACT
  Filled 2018-01-20 (×3): qty 3

## 2018-01-20 MED ORDER — GUAIFENESIN-DM 100-10 MG/5ML PO SYRP
5.0000 mL | ORAL_SOLUTION | ORAL | Status: DC | PRN
  Administered 2018-01-20 – 2018-01-22 (×2): 5 mL via ORAL
  Filled 2018-01-20 (×2): qty 5

## 2018-01-20 NOTE — Evaluation (Signed)
Physical Therapy Evaluation Patient Details Name: [name] MRN: [id number] DOB: 1925/07/18 Today's Date: 01/20/2018   History of Present Illness  84 y.o. male with medical history significant of renal cell CA; CKD; PVD; HTN; COPD; HLD; h/o CVA; and BPH presenting with leg pain and swelling and SoB. Back in March, he had temporal arteritis (given steroids) and he has resultant right eye blindness and mostly blindness in the left eye pulmonary edema on CXR. In A-fib. Most likely CHF.   Clinical Impression  PTA pt was supervisory for limited household ambulation with RW, and dependent of wife for ADLs. Pt is currently limited in safe mobility by increased bilateral LE pain with weightbearing due to edema, as well as decreased strength and safety awareness. Pt currently min A for sit>stand transfer and ambulation of 20 feet with RW. PT currently recommends HHPT level rehab at d/c. Pt has had previous HHPT and was not happy with his therapist, agreeable to having therapy again as long as it is not the same person. PT will continue to follow acutely.     Follow Up Recommendations Home health PT;Supervision/Assistance - 24 hour    Equipment Recommendations  None recommended by PT    Recommendations for Other Services OT consult     Precautions / Restrictions Precautions Precautions: Fall Restrictions Weight Bearing Restrictions: No      Mobility  Bed Mobility               General bed mobility comments: OOB in recliner  Transfers Overall transfer level: Needs assistance Equipment used: Rolling walker (2 wheeled) Transfers: Sit to/from Stand Sit to Stand: Min assist         General transfer comment: minA for power up and steadying in RW  Ambulation/Gait Ambulation/Gait assistance: Min assist Ambulation Distance (Feet): 20 Feet Assistive device: Rolling walker (2 wheeled) Gait Pattern/deviations: Step-through pattern;Decreased step length - right;Decreased step  length - left;Shuffle;Wide base of support;Trunk flexed Gait velocity: slowed Gait velocity interpretation: <1.31 ft/sec, indicative of household ambulator General Gait Details: minA for management of RW, vc for proximity and not turning loose of RW to reach out for furniture to assist       Balance Overall balance assessment: Needs assistance Sitting-balance support: Feet supported;No upper extremity supported Sitting balance-Leahy Scale: Fair     Standing balance support: Bilateral upper extremity supported Standing balance-Leahy Scale: Poor Standing balance comment: utilizes RW                             Pertinent Vitals/Pain Pain Assessment: Faces Faces Pain Scale: Hurts little more Pain Location: bilateral LE R>L with weightbearing Pain Descriptors / Indicators: Guarding;Grimacing Pain Intervention(s): Limited activity within patient's tolerance;Monitored during session;Repositioned    Home Living Family/patient expects to be discharged to:: Private residence Living Arrangements: Spouse/significant other;Children Available Help at Discharge: Family;Available 24 hours/day Type of Home: House Home Access: Stairs to enter Entrance Stairs-Rails: Psychiatric nurse of Steps: 4 Home Layout: One level Home Equipment: Walker - 2 wheels;Shower seat;Cane - single point      Prior Function Level of Independence: Needs assistance   Gait / Transfers Assistance Needed: requires RW for limited household ambulation  ADL's / Homemaking Assistance Needed: secondary to vision loss wife assist with ADLs, and iADLs        Hand Dominance   Dominant Hand: Right    Extremity/Trunk Assessment   Upper Extremity Assessment Upper Extremity Assessment: Generalized weakness  Lower Extremity Assessment Lower Extremity Assessment: RLE deficits/detail;LLE deficits/detail RLE Deficits / Details: +2 edema in calf with weeping, ROM WFL, strength grossly assess  with mobility to be 3+/5 RLE Sensation: decreased light touch LLE Deficits / Details: +2 edema in calf with weeping, ROM WFL, strength grossly assess with mobility to be 3+/5 LLE Sensation: decreased light touch       Communication   Communication: HOH  Cognition Arousal/Alertness: Awake/alert Behavior During Therapy: WFL for tasks assessed/performed Overall Cognitive Status: Within Functional Limits for tasks assessed                                        General Comments General comments (skin integrity, edema, etc.): On 2L supplemental O2 via nasal cannula, at rest SaO2 96%O2, with ambulation pt had DoE, SaO2 91%O2, Pt in A fib during session max HR 98bpm         Assessment/Plan    PT Assessment Patient needs continued PT services  PT Problem List Decreased strength;Decreased activity tolerance;Decreased balance;Decreased mobility;Decreased safety awareness;Decreased knowledge of use of DME;Cardiopulmonary status limiting activity;Pain       PT Treatment Interventions DME instruction;Gait training;Stair training;Functional mobility training;Therapeutic activities;Therapeutic exercise;Balance training;Cognitive remediation;Patient/family education    PT Goals (Current goals can be found in the Care Plan section)  Acute Rehab PT Goals Patient Stated Goal: feel better PT Goal Formulation: With patient/family Time For Goal Achievement: 02/03/18 Potential to Achieve Goals: Fair    Frequency Min 3X/week    AM-PAC PT "6 Clicks" Daily Activity  Outcome Measure Difficulty turning over in bed (including adjusting bedclothes, sheets and blankets)?: A Little Difficulty moving from lying on back to sitting on the side of the bed? : Unable Difficulty sitting down on and standing up from a chair with arms (e.g., wheelchair, bedside commode, etc,.)?: Unable Help needed moving to and from a bed to chair (including a wheelchair)?: A Little Help needed walking in  hospital room?: A Little Help needed climbing 3-5 steps with a railing? : Total 6 Click Score: 12    End of Session Equipment Utilized During Treatment: Gait belt;Oxygen Activity Tolerance: Patient limited by fatigue;Patient limited by pain Patient left: in chair;with call bell/phone within reach;with chair alarm set;with family/visitor present Nurse Communication: Mobility status PT Visit Diagnosis: Unsteadiness on feet (R26.81);Other abnormalities of gait and mobility (R26.89);Muscle weakness (generalized) (M62.81);Difficulty in walking, not elsewhere classified (R26.2);Pain Pain - Right/Left: (bilateral) Pain - part of body: Leg    Time: 1610-9604 PT Time Calculation (min) (ACUTE ONLY): 34 min   Charges:   PT Evaluation $PT Eval Moderate Complexity: 1 Mod PT Treatments $Gait Training: 8-22 mins   PT G Codes:        Oracio Galen B. Migdalia Dk PT, DPT Acute Rehabilitation  [phone] Pager [phone]    [place] 01/20/2018, 9:48 AM

## 2018-01-20 NOTE — Care Management Note (Signed)
Case Management Note  Patient Details  Name: [name] MRN: [id number] Date of Birth: 07-May-1925  Subjective/Objective:   From home with wife, chf ex copd, temporal arteritis, wife chose [facility] for [facility] services for HHPT, [facility],  Referral given to [facility] with [facility] . Soc will begin 24-48 hrs post dc.  Patient has walker, cane, w/chair and bsc at home.  Cont to wean off oxygen.                  Action/Plan: DC home when ready with [facility] services.   Expected Discharge Date:                  Expected Discharge Plan:  [place]  In-House Referral:     Discharge planning Services  CM Consult  Post Acute Care Choice:  Home Health Choice offered to:  Spouse  DME Arranged:    DME Agency:     HH Arranged:  RN, Disease Management, PT [place] Agency:  [place]  Status of Service:  Completed, signed off  If discussed at [place] of Stay Meetings, dates discussed:    Additional Comments:  Zenon Mayo, RN 01/20/2018, 3:31 PM

## 2018-01-20 NOTE — Plan of Care (Signed)
  Problem: Education: Goal: Knowledge of General Education information will improve Outcome: Progressing   Problem: Education: Goal: Ability to demonstrate management of disease process will improve Outcome: Progressing Goal: Ability to verbalize understanding of medication therapies will improve Outcome: Progressing   Problem: Activity: Goal: Capacity to carry out activities will improve Outcome: Progressing   Problem: Cardiac: Goal: Ability to achieve and maintain adequate cardiopulmonary perfusion will improve Outcome: Progressing

## 2018-01-20 NOTE — Progress Notes (Signed)
Inpatient Diabetes Program Recommendations  AACE/ADA: New Consensus Statement on Inpatient Glycemic Control (2015)  Target Ranges:  Prepandial:   less than 140 mg/dL      Peak postprandial:   less than 180 mg/dL (1-2 hours)      Critically ill patients:  140 - 180 mg/dL   Lab Results  Component Value Date   GLUCAP 145 (H) 10/28/2017   HGBA1C 5.5 10/26/2017    Review of Glycemic Control Results for [name], [name] (MRN [id number]) as of 01/20/2018 14:17  Ref. Range 01/19/2018 10:19 01/19/2018 10:27 01/19/2018 14:44 01/19/2018 15:05 01/20/2018 02:27  Glucose Latest Ref Range: 65 - 99 mg/dL 135 (H)    232 (H)   Diabetes history: none Outpatient Diabetes medications: none Current orders for Inpatient glycemic control: none  Inpatient Diabetes Program Recommendations:    In the setting of steroids, may want to consider placing glycemic control order set for Novolog 0-9 units TID and to obtain CBGs TID + HS.   Thanks, Bronson Curb, MSN, RNC-OB Diabetes Coordinator [phone] (8a-5p)

## 2018-01-20 NOTE — Progress Notes (Signed)
  Echocardiogram 2D Echocardiogram has been performed.  [name] 01/20/2018, 12:36 PM

## 2018-01-20 NOTE — Progress Notes (Signed)
PROGRESS NOTE                                                                                                                                                                                                             Patient Demographics:    [name], is a 84 y.o. male, DOB - 1925-06-15, TKP:[id number]  Admit date - 01/19/2018   Admitting Physician Karmen Bongo, MD  Outpatient Primary MD for the patient is [name], MD  LOS - 1   Chief Complaint  Patient presents with  . Leg Pain       Brief Narrative   84 y.o. male with medical history significant of renal cell CA; CKD; PVD; HTN; COPD; HLD; h/o CVA; and BPH presenting with leg pain, worsening lower extremity edema and shortness of breath, work-up significant for CHF exacerbation.   Subjective:    [name] today has, No headache, No chest pain, No abdominal pain , reports lower extremity pain has improved, as well as dyspnea has improved.   Assessment  & Plan :    Principal Problem:   SOB (shortness of breath) Active Problems:   Hyperlipidemia   Essential hypertension   COPD (chronic obstructive pulmonary disease) ([place])   Smoker unmotivated to quit   Temporal arteritis (HCC)   Bilateral lower extremity edema   Acute kidney injury superimposed on CKD (HCC)   Elevated lactic acid level   Hyperglycemia   Pressure injury of skin   Acute on chronic diastolic CHF -Presents with marked lower extremity edema, dyspnea, has elevated JVD, elevated proBNP, volume overload on chest x-ray. -He is improving on IV Lasix, continue with current dose 40 mg IV twice daily, continue with daily weight, strict signs and outs, low-salt diet.  BLE edema -Due to CHF, venous stasis, venous Doppler negative for DVT -We will start on TED hose  Elevated lactate -Due to CHF, monitor with diuresis  AKI on CKD stage IV -Baseline creatinine 1.8, it is 2.2 on presentation, monitor closely as  on diuresis  COPD, still smoking -Does not appear to be having significant wheezing, on chronic steroids, continue with home dose, continue with as needed nebs.  Tobacco abuse -Encourage smoking cessation - although, realistically, at 20 with an ongoing will to smoke, this may be a losing battle  HTN -Hold Norvasc and consider  discontinuation based on edema currently blood pressure is acceptable continue to monitor on diuresis  Temporal arteritis -Continue steroids -He is, unfortunately, blind as a consequence      Code Status : DNR  Family Communication  : Wife at bedside  Disposition Plan  : Consult PT  Consults  :  None  Procedures  : none  DVT Prophylaxis  : [place] lovenox  Lab Results  Component Value Date   PLT 181 01/20/2018    Antibiotics  :    Anti-infectives (From admission, onward)   Start     Dose/Rate Route Frequency Ordered Stop   01/19/18 1245  cefTRIAXone (ROCEPHIN) 1 g in sodium chloride 0.9 % 100 mL IVPB     1 g 200 mL/hr over 30 Minutes Intravenous  Once 01/19/18 1234 01/19/18 1345   01/19/18 1245  azithromycin (ZITHROMAX) 500 mg in sodium chloride 0.9 % 250 mL IVPB     500 mg 250 mL/hr over 60 Minutes Intravenous  Once 01/19/18 1234 01/19/18 1609        Objective:   Vitals:   01/19/18 1911 01/19/18 2347 01/20/18 0727 01/20/18 0755  BP:  122/89  113/73  Pulse:  79 80 85  Resp:  (!) 28 (!) 28 20  Temp:  98 F (36.7 C)  98.3 F (36.8 C)  TempSrc:  Oral  Oral  SpO2:  96% 94% 96%  Weight: 74.8 kg (164 lb 14.5 oz)     Height:        Wt Readings from Last 3 Encounters:  01/19/18 74.8 kg (164 lb 14.5 oz)  01/13/18 74.8 kg (165 lb)  12/03/17 80.3 kg (177 lb)     Intake/Output Summary (Last 24 hours) at 01/20/2018 1305 Last data filed at 01/20/2018 1000 Gross per 24 hour  Intake 450 ml  Output 875 ml  Net -425 ml     Physical Exam  Awake Alert, Oriented X 2. + JVD, No cervical lymphadenopathy appriciated.  Symmetrical Chest  wall movement, Good air movement bilaterally, crackles on the basis RRR,No Gallops,Rubs or new Murmurs, No Parasternal Heave +ve B.Sounds, Abd Soft, No tenderness, No rebound - guarding or rigidity. No Cyanosis, Clubbing ,+2 edema, No new Rash or bruise      Data Review:    CBC Recent Labs  Lab 01/19/18 1019 01/20/18 0227  WBC 9.7 9.9  HGB 12.4* 11.3*  HCT 37.0* 33.7*  PLT 188 181  MCV 97.1 97.1  MCH 32.5 32.6  MCHC 33.5 33.5  RDW 18.8* 18.7*  LYMPHSABS 1.5 0.9  MONOABS 0.4 0.2  EOSABS 0.1 0.0  BASOSABS 0.0 0.1    Chemistries  Recent Labs  Lab 01/19/18 1019 01/20/18 0227  NA 139 138  K 4.2 4.9  CL 102 102  CO2 24 25  GLUCOSE 135* 232*  BUN 74* 75*  CREATININE 2.11* 2.21*  CALCIUM 8.3* 8.0*  AST 18  --   ALT 27  --   ALKPHOS 51  --   BILITOT 0.8  --    ------------------------------------------------------------------------------------------------------------------ No results for input(s): CHOL, HDL, LDLCALC, TRIG, CHOLHDL, LDLDIRECT in the last 72 hours.  Lab Results  Component Value Date   HGBA1C 5.5 10/26/2017   ------------------------------------------------------------------------------------------------------------------ No results for input(s): TSH, T4TOTAL, T3FREE, THYROIDAB in the last 72 hours.  Invalid input(s): FREET3 ------------------------------------------------------------------------------------------------------------------ No results for input(s): VITAMINB12, FOLATE, FERRITIN, TIBC, IRON, RETICCTPCT in the last 72 hours.  Coagulation profile No results for input(s): INR, PROTIME in the last 168 hours.  No  results for input(s): DDIMER in the last 72 hours.  Cardiac Enzymes No results for input(s): CKMB, TROPONINI, MYOGLOBIN in the last 168 hours.  Invalid input(s): CK ------------------------------------------------------------------------------------------------------------------    Component Value Date/Time   BNP 226.0 (H)  01/19/2018 1444    Inpatient Medications  Scheduled Meds: . allopurinol  100 mg Oral Daily  . aspirin EC  81 mg Oral Daily  . enoxaparin (LOVENOX) injection  30 mg Subcutaneous Q24H  . furosemide  40 mg Intravenous Q12H  . ipratropium-albuterol  3 mL Nebulization TID  . mouth rinse  15 mL Mouth Rinse BID  . mirtazapine  15 mg Oral QHS  . pantoprazole  40 mg Oral Daily  . pravastatin  40 mg Oral QPM  . predniSONE  10 mg Oral Q breakfast   And  . prednisoLONE  2.5 mg Oral QPM  . sodium chloride flush  3 mL Intravenous Q12H   Continuous Infusions: . sodium chloride     PRN Meds:.sodium chloride, acetaminophen, albuterol, guaiFENesin-dextromethorphan, ondansetron (ZOFRAN) IV, polyethylene glycol, sodium chloride flush  Micro Results Recent Results (from the past 240 hour(s))  Blood culture (routine x 2)     Status: None (Preliminary result)   Collection Time: 01/19/18  2:44 PM  Result Value Ref Range Status   Specimen Description BLOOD BLOOD LEFT FOREARM  Final   Special Requests   Final    BOTTLES DRAWN AEROBIC AND ANAEROBIC Blood Culture adequate volume   Culture   Final    NO GROWTH < 24 HOURS Performed at [facility], [address] [place], [place] [address]    Report Status PENDING  Incomplete  Blood culture (routine x 2)     Status: None (Preliminary result)   Collection Time: 01/19/18  6:33 PM  Result Value Ref Range Status   Specimen Description BLOOD RIGHT ARM  Final   Special Requests   Final    BOTTLES DRAWN AEROBIC ONLY Blood Culture adequate volume   Culture   Final    NO GROWTH < 24 HOURS Performed at [facility], [place] [address] [place], [place] [address]    Report Status PENDING  Incomplete    Radiology Reports Dg Chest 2 View  Result Date: 01/19/2018 CLINICAL DATA:  Cough and pneumonia EXAM: CHEST - 2 VIEW COMPARISON:  11/26/2017 FINDINGS: Increased bilateral interstitial opacities consistent with mild interstitial pulmonary edema. No  pleural effusion or pneumothorax. No focal consolidation. Cardiomediastinal contours are normal. Calcific aortic atherosclerosis is unchanged. IMPRESSION: Mild interstitial pulmonary edema. Electronically Signed   By: Ulyses Jarred M.D.   On: 01/19/2018 13:55    Time Spent in minutes  25 minutes   Phillips Climes M.D on 01/20/2018 at 1:05 PM  Between 7am to 7pm - Pager - [phone]  After 7pm go to [email or web address] - password [facility]  [facility] -  Office  [phone]

## 2018-01-21 ENCOUNTER — Encounter: Payer: Self-pay | Admitting: *Deleted

## 2018-01-21 DIAGNOSIS — I5033 Acute on chronic diastolic (congestive) heart failure: Secondary | ICD-10-CM

## 2018-01-21 DIAGNOSIS — I10 Essential (primary) hypertension: Secondary | ICD-10-CM

## 2018-01-21 DIAGNOSIS — R0602 Shortness of breath: Secondary | ICD-10-CM

## 2018-01-21 LAB — BASIC METABOLIC PANEL
Anion gap: 12 (ref 5–15)
BUN: 77 mg/dL — AB (ref 6–20)
CHLORIDE: 101 mmol/L (ref 101–111)
CO2: 26 mmol/L (ref 22–32)
CREATININE: 2.22 mg/dL — AB (ref 0.61–1.24)
Calcium: 8.2 mg/dL — ABNORMAL LOW (ref 8.9–10.3)
GFR calc Af Amer: 28 mL/min — ABNORMAL LOW (ref >60)
GFR calc non Af Amer: 24 mL/min — ABNORMAL LOW (ref >60)
Glucose, Bld: 146 mg/dL — ABNORMAL HIGH (ref 65–99)
Potassium: 3.9 mmol/L (ref 3.5–5.1)
Sodium: 139 mmol/L (ref 135–145)

## 2018-01-21 MED ORDER — IPRATROPIUM-ALBUTEROL 0.5-2.5 (3) MG/3ML IN SOLN
3.0000 mL | Freq: Two times a day (BID) | RESPIRATORY_TRACT | Status: DC
  Administered 2018-01-21 – 2018-01-22 (×3): 3 mL via RESPIRATORY_TRACT
  Filled 2018-01-21 (×4): qty 3

## 2018-01-21 MED ORDER — FUROSEMIDE 10 MG/ML IJ SOLN
40.0000 mg | Freq: Three times a day (TID) | INTRAMUSCULAR | Status: DC
  Administered 2018-01-21 – 2018-01-22 (×4): 40 mg via INTRAVENOUS
  Filled 2018-01-21 (×4): qty 4

## 2018-01-21 NOTE — Progress Notes (Signed)
Occupational Therapy Evaluation Patient Details Name: [name] MRN: [id number] DOB: 10-23-1924 Today's Date: 01/21/2018    History of Present Illness 84 y.o. male with medical history significant of renal cell CA; CKD; PVD; HTN; COPD; HLD; h/o CVA; and BPH presenting with leg pain and swelling and SoB. Back in March, he had temporal arteritis (given steroids) and he has resultant right eye blindness and mostly blindness in the left eye pulmonary edema on CXR. In A-fib. Most likely CHF.    Clinical Impression   PTA, patient lived with spouse who provided assistance with ADLs and IADLs secondary to pt's vision loss. Pt currently minA for toileting and sit<>stand. Pt ambulated from bed to toilet with RW and minA for balance deficits, pt demonstrated DoE 3/4 which is abnormal from baseline. Pt independent with clothing management while standing to urinate at toilet. Despite vision deficits, pt was able to notice and identify grab bar in bathroom. During functional activity 02 remained around 95%. Due to deficits in vision, endurance, and safety listed below, recommending HHOT to promote maximum independence.    HR 90s; spike 147 during light functional activity At rest O2 sats:96 RA   Follow Up Recommendations  Home health OT;Supervision/Assistance - 24 hour(Informed Case Manager of HHOT recommendation)    Equipment Recommendations       Recommendations for Other Services       Precautions / Restrictions Precautions Precautions: Fall Restrictions Weight Bearing Restrictions: No      Mobility Bed Mobility Overal bed mobility: Modified Independent                Transfers Overall transfer level: Needs assistance Equipment used: Rolling walker (2 wheeled) Transfers: Sit to/from Stand Sit to Stand: Min assist;From elevated surface         General transfer comment: no dizziness during transfers; educated pt/family on environmental modifications/compensatory strategies  to reduce risk of falls    Balance Overall balance assessment: Needs assistance Sitting-balance support: Feet supported;No upper extremity supported Sitting balance-Leahy Scale: Good     Standing balance support: Bilateral upper extremity supported Standing balance-Leahy Scale: Poor Standing balance comment: heavy reliance BUE on RW                           ADL either performed or assessed with clinical judgement   ADL Overall ADL's : Needs assistance/impaired Eating/Feeding: Set up   Grooming: Set up   Upper Body Bathing: Set up   Lower Body Bathing: Moderate assistance   Upper Body Dressing : Minimal assistance   Lower Body Dressing: Moderate assistance   Toilet Transfer: Minimal assistance;RW(simulated sit<>stand with recliner; urinated in toilet)   Toileting- Clothing Manipulation and Hygiene: Supervision/safety;Sit to/from stand       Functional mobility during ADLs: Minimal assistance;Rolling walker General ADL Comments: Secondary to vision loss, wife provides maxA for ADLs. (dyspnea 3-4 during light functional activity)     Vision Baseline Vision/History: (vision loss onset march(post temporal arteritis)) Patient Visual Report: No change from baseline Vision Assessment?: Vision impaired- to be further tested in functional context     Perception     Praxis      Pertinent Vitals/Pain Pain Location: bilateral LE R>L with weightbearing Pain Descriptors / Indicators: Discomfort     Hand Dominance Right   Extremity/Trunk Assessment         Cervical / Trunk Assessment Cervical / Trunk Assessment: Kyphotic   Communication Communication Communication: HOH   Cognition Arousal/Alertness:  Awake/alert Behavior During Therapy: WFL for tasks assessed/performed Overall Cognitive Status: Within Functional Limits for tasks assessed                                 General Comments: most likely baseline deficits   General Comments        Exercises     Shoulder Instructions      Home Living Family/patient expects to be discharged to:: Private residence Living Arrangements: Spouse/significant other;Children Available Help at Discharge: Family;Available 24 hours/day Type of Home: House Home Access: Stairs to enter [employer] of Steps: 4 Entrance Stairs-Rails: Right;Left Home Layout: One level     Bathroom Shower/Tub: Corporate investment banker: Standard Bathroom Accessibility: Yes   Home Equipment: Environmental consultant - 2 wheels;Shower seat;Cane - single point   Additional Comments: Pt lives with wife, who is in good health, and daughter, who is a brittle diabetic       Prior Functioning/Environment Level of Independence: Needs assistance  Gait / Transfers Assistance Needed: requires RW for limited household ambulation ADL's / Homemaking Assistance Needed: secondary to vision loss wife assist with ADLs, and iADLs Communication / Swallowing Assistance Needed: HOH; talk slowly Comments: since onset of vision loss, wife has been assisting with all ADLs.  Son reports pt is very sedentary, he spends most of his time in room and watching golf.        OT Problem List: Decreased activity tolerance;Impaired balance (sitting and/or standing);Impaired vision/perception      OT Treatment/Interventions: Self-care/ADL training;Energy conservation;Visual/perceptual remediation/compensation;Patient/family education;Therapeutic exercise;DME and/or AE instruction;Therapeutic activities;Balance training    OT Goals(Current goals can be found in the care plan section) Acute Rehab OT Goals Patient Stated Goal: go home OT Goal Formulation: With patient Time For Goal Achievement: 02/04/18 Potential to Achieve Goals: Good ADL Goals Pt Will Perform Upper Body Dressing: with set-up;sitting;bed level Pt Will Transfer to Toilet: with min guard assist;ambulating Additional ADL Goal #1: pt/caregiver will  demonstrate understanding of 3 strategies to reduce risk of falls.  OT Frequency: Min 2X/week   Barriers to D/C:            Co-evaluation              AM-PAC PT "6 Clicks" Daily Activity     Outcome Measure Help from another person eating meals?: A Little Help from another person taking care of personal grooming?: A Lot Help from another person toileting, which includes using toliet, bedpan, or urinal?: A Little Help from another person bathing (including washing, rinsing, drying)?: A Lot Help from another person to put on and taking off regular upper body clothing?: A Lot Help from another person to put on and taking off regular lower body clothing?: A Lot 6 Click Score: 14   End of Session Equipment Utilized During Treatment: Gait belt;Rolling walker Nurse Communication: Mobility status  Activity Tolerance: Patient limited by fatigue Patient left: in chair;with call bell/phone within reach;with family/visitor present  OT Visit Diagnosis: Unsteadiness on feet (R26.81);Muscle weakness (generalized) (M62.81)                Time: 3474-2595 OT Time Calculation (min): 32 min Charges:  OT General Charges $OT Visit: 1 Visit OT Evaluation $OT Eval Moderate Complexity: 1 Mod OT Treatments $Self Care/Home Management : 8-22 mins G-Codes:     Dorinda Hill OTS     01/21/2018, 3:26 PM

## 2018-01-21 NOTE — Progress Notes (Signed)
PROGRESS NOTE                                                                                                                                                                                                             Patient Demographics:    [name], is a 84 y.o. male, DOB - April 15, 1925, PPJ:[id number]  Admit date - 01/19/2018   Admitting Physician Karmen Bongo, MD  Outpatient Primary MD for the patient is [name], MD  LOS - 2   Chief Complaint  Patient presents with  . Leg Pain       Brief Narrative   84 y.o. male with medical history significant of renal cell CA; CKD; PVD; HTN; COPD; HLD; h/o CVA; and BPH presenting with leg pain, worsening lower extremity edema and shortness of breath, work-up significant for CHF exacerbation.   Subjective:    [name] today has, No headache, No chest pain, No abdominal pain , denies lower extremity pain today  Assessment  & Plan :    Principal Problem:   SOB (shortness of breath) Active Problems:   Hyperlipidemia   Essential hypertension   COPD (chronic obstructive pulmonary disease) ([place])   Smoker unmotivated to quit   Temporal arteritis (HCC)   Bilateral lower extremity edema   Acute kidney injury superimposed on CKD (HCC)   Elevated lactic acid level   Hyperglycemia   Pressure injury of skin   Acute on chronic diastolic CHF -echo this admission showing preserved EF at 58%, grade 1 diastolic dysfunction -Presents with marked lower extremity edema, dyspnea, has elevated JVD, elevated proBNP, volume overload on chest x-ray. -He is on Lasix 40 mg IV twice daily, overall no significant diuresis, remains same weight, renal function remained stable, so we will increase his Lasix toy TID,  continue with daily weight, strict signs and outs, low-salt diet.  BLE edema -Due to CHF, venous stasis, venous Doppler negative for DVT - on TED hose  Elevated lactate -Due to CHF, monitor  with diuresis  AKI on CKD stage IV -Baseline creatinine 1.8, it is 2.2 on presentation, renal function remained stable so far, continue to monitor closely as on IV diuresis -Patient has one kidney  COPD, still smoking -Does not appear to be having significant wheezing, on chronic steroids, continue with home dose, continue with as needed nebs.  Tobacco abuse -Encourage smoking cessation - although, realistically, at 52 with an ongoing will to smoke, this may be a losing battle  HTN -Hold Norvasc and consider discontinuation based on edema, blood pressure remained stable, continue to monitor .  Temporal arteritis -Continue steroids -He is, unfortunately, blind as a consequence      Code Status : DNR  Family Communication  : Wife at bedside  Disposition Plan  : Consult PT  Consults  :  None  Procedures  : none  DVT Prophylaxis  : [place] lovenox  Lab Results  Component Value Date   PLT 181 01/20/2018    Antibiotics  :    Anti-infectives (From admission, onward)   Start     Dose/Rate Route Frequency Ordered Stop   01/19/18 1245  cefTRIAXone (ROCEPHIN) 1 g in sodium chloride 0.9 % 100 mL IVPB     1 g 200 mL/hr over 30 Minutes Intravenous  Once 01/19/18 1234 01/19/18 1345   01/19/18 1245  azithromycin (ZITHROMAX) 500 mg in sodium chloride 0.9 % 250 mL IVPB     500 mg 250 mL/hr over 60 Minutes Intravenous  Once 01/19/18 1234 01/19/18 1609        Objective:   Vitals:   01/20/18 2116 01/20/18 2347 01/21/18 0753 01/21/18 0812  BP:  (!) 128/59 135/60   Pulse:  93 92   Resp:  15 18   Temp:  97.9 F (36.6 C) 97.8 F (36.6 C)   TempSrc:  Oral Oral   SpO2: 95% 96% 98% 96%  Weight:      Height:        Wt Readings from Last 3 Encounters:  01/19/18 74.8 kg (164 lb 14.5 oz)  01/13/18 74.8 kg (165 lb)  12/03/17 80.3 kg (177 lb)     Intake/Output Summary (Last 24 hours) at 01/21/2018 1105 Last data filed at 01/21/2018 1000 Gross per 24 hour  Intake 1375 ml    Output 355 ml  Net 1020 ml     Physical Exam  Awake alert, pleasant and appropriate Remains with +JVD Good air entry bilaterally, no crackles at the bases today,no use of accessory muscles RRR,No Gallops,Rubs or new Murmurs, No Parasternal Heave +ve B.Sounds, Abd Soft, No tenderness, No rebound - guarding or rigidity. No Cyanosis, Clubbing , remains with +2 edema today, No new Rash or bruise      Data Review:    CBC Recent Labs  Lab 01/19/18 1019 01/20/18 0227  WBC 9.7 9.9  HGB 12.4* 11.3*  HCT 37.0* 33.7*  PLT 188 181  MCV 97.1 97.1  MCH 32.5 32.6  MCHC 33.5 33.5  RDW 18.8* 18.7*  LYMPHSABS 1.5 0.9  MONOABS 0.4 0.2  EOSABS 0.1 0.0  BASOSABS 0.0 0.1    Chemistries  Recent Labs  Lab 01/19/18 1019 01/20/18 0227 01/21/18 0330  NA 139 138 139  K 4.2 4.9 3.9  CL 102 102 101  CO2 24 25 26   GLUCOSE 135* 232* 146*  BUN 74* 75* 77*  CREATININE 2.11* 2.21* 2.22*  CALCIUM 8.3* 8.0* 8.2*  AST 18  --   --   ALT 27  --   --   ALKPHOS 51  --   --   BILITOT 0.8  --   --    ------------------------------------------------------------------------------------------------------------------ No results for input(s): CHOL, HDL, LDLCALC, TRIG, CHOLHDL, LDLDIRECT in the last 72 hours.  Lab Results  Component Value Date   HGBA1C 5.5 10/26/2017   ------------------------------------------------------------------------------------------------------------------ No results for input(s):  TSH, T4TOTAL, T3FREE, THYROIDAB in the last 72 hours.  Invalid input(s): FREET3 ------------------------------------------------------------------------------------------------------------------ No results for input(s): VITAMINB12, FOLATE, FERRITIN, TIBC, IRON, RETICCTPCT in the last 72 hours.  Coagulation profile No results for input(s): INR, PROTIME in the last 168 hours.  No results for input(s): DDIMER in the last 72 hours.  Cardiac Enzymes No results for input(s): CKMB, TROPONINI,  MYOGLOBIN in the last 168 hours.  Invalid input(s): CK ------------------------------------------------------------------------------------------------------------------    Component Value Date/Time   BNP 226.0 (H) 01/19/2018 1444    Inpatient Medications  Scheduled Meds: . allopurinol  100 mg Oral Daily  . aspirin EC  81 mg Oral Daily  . enoxaparin (LOVENOX) injection  30 mg Subcutaneous Q24H  . furosemide  40 mg Intravenous Q8H  . ipratropium-albuterol  3 mL Nebulization BID  . mouth rinse  15 mL Mouth Rinse BID  . mirtazapine  15 mg Oral QHS  . pantoprazole  40 mg Oral Daily  . pravastatin  40 mg Oral QPM  . predniSONE  10 mg Oral Q breakfast   And  . prednisoLONE  2.5 mg Oral QPM  . sodium chloride flush  3 mL Intravenous Q12H   Continuous Infusions: . sodium chloride     PRN Meds:.sodium chloride, acetaminophen, albuterol, guaiFENesin-dextromethorphan, ondansetron (ZOFRAN) IV, polyethylene glycol, sodium chloride flush  Micro Results Recent Results (from the past 240 hour(s))  Blood culture (routine x 2)     Status: None (Preliminary result)   Collection Time: 01/19/18  2:44 PM  Result Value Ref Range Status   Specimen Description BLOOD BLOOD LEFT FOREARM  Final   Special Requests   Final    BOTTLES DRAWN AEROBIC AND ANAEROBIC Blood Culture adequate volume   Culture   Final    NO GROWTH < 24 HOURS Performed at [facility], [address] [place], [place] [address]    Report Status PENDING  Incomplete  Blood culture (routine x 2)     Status: None (Preliminary result)   Collection Time: 01/19/18  6:33 PM  Result Value Ref Range Status   Specimen Description BLOOD RIGHT ARM  Final   Special Requests   Final    BOTTLES DRAWN AEROBIC ONLY Blood Culture adequate volume   Culture   Final    NO GROWTH < 24 HOURS Performed at [facility], [place] [address] [place], [place] [address]    Report Status PENDING  Incomplete    Radiology Reports Dg Chest 2  View  Result Date: 01/19/2018 CLINICAL DATA:  Cough and pneumonia EXAM: CHEST - 2 VIEW COMPARISON:  11/26/2017 FINDINGS: Increased bilateral interstitial opacities consistent with mild interstitial pulmonary edema. No pleural effusion or pneumothorax. No focal consolidation. Cardiomediastinal contours are normal. Calcific aortic atherosclerosis is unchanged. IMPRESSION: Mild interstitial pulmonary edema. Electronically Signed   By: Ulyses Jarred M.D.   On: 01/19/2018 13:55     Phillips Climes M.D on 01/21/2018 at 11:05 AM  Between 7am to 7pm - Pager - [phone]  After 7pm go to [email or web address] - password [facility]  [facility] -  Office  [phone]

## 2018-01-21 NOTE — Progress Notes (Signed)
OT Note - Addendum    01/21/18 1200  OT Visit Information  Last OT Received On 01/21/18  OT Time Calculation  OT Start Time (ACUTE ONLY) 0950  OT Stop Time (ACUTE ONLY) 1022  OT Time Calculation (min) 32 min  OT General Charges  $OT Visit 1 Visit  OT Evaluation  $OT Eval Moderate Complexity 1 Mod  OT Treatments  $Self Care/Home Management  8-22 mins  Maurie Boettcher, OT/L  OT Clinical Specialist [phone]

## 2018-01-21 NOTE — Care Management Note (Signed)
Case Management Note  Patient Details  Name: [name] MRN: [id number] Date of Birth: June 15, 1925  Subjective/Objective:   From home with wife, chf ex copd, temporal arteritis, wife chose [facility] for [facility] services for HHPT, [facility],  Referral given to [facility] with [facility] . Soc will begin 24-48 hrs post dc.  Patient has walker, cane, w/chair and bsc at home.  Cont to wean off oxygen.                                     Action/Plan: DC home with [facility] services when ready.  Expected Discharge Date:                  Expected Discharge Plan:  [place]  In-House Referral:     Discharge planning Services  CM Consult  Post Acute Care Choice:  Home Health Choice offered to:  Spouse  DME Arranged:    DME Agency:     HH Arranged:  RN, Disease Management, PT, OT HH Agency:  [facility]  Status of Service:  Completed, signed off  If discussed at [place] of Stay Meetings, dates discussed:    Additional Comments:  Zenon Mayo, RN 01/21/2018, 12:33 PM

## 2018-01-21 NOTE — Consult Note (Addendum)
   [facility] CM Inpatient Consult   01/21/2018  BRAN ALDRIDGE 10/26/1924 [id number]    Patient screened for potential [facility] Care Management needs due to increased unplanned readmission risk score of 35%.   Went to bedside to speak with [place] and his son, Francois Elk [facility] Care Management program services.   Both patient and son [name]) agreeable and [facility] Care Management written consent obtained. Asked if writer needed to contact wife to discuss. However, [name] (son) states he will pass the information along to Mrs. Kinder (wife). Patient agreeable.   Braxxton Stoudt (wife) [phone] Mizraim Harmening (son) [phone] Both are listed on [place] Management consent.  Patient lives with wife. [name] (son) lives nearby.  Son reports he takes patient to MD appointments.  Denies any concerns with medications. Confirms Primary Care MD [facility] listed as doing transition of care calls post discharge).  Discussed [facility] Care Management will not interfere or replace services provided by home health.   Will make referral to [facility] for CHF, COPD.  Made inpatient RNCM aware [facility] will following.    Marthenia Rolling, MSN-Ed, RN,BSN [facility] Liaison [phone]

## 2018-01-21 NOTE — Progress Notes (Signed)
Physical Therapy Treatment Patient Details Name: [name] MRN: [id number] DOB: May 03, 1925 Today's Date: 01/21/2018    History of Present Illness 84 y.o. male with medical history significant of renal cell CA; CKD; PVD; HTN; COPD; HLD; h/o CVA; and BPH presenting with leg pain and swelling and SoB. Back in March, he had temporal arteritis (given steroids) and he has resultant right eye blindness and mostly blindness in the left eye pulmonary edema on CXR. In A-fib. Most likely CHF.     PT Comments    Pt is making slow progress towards his goals, however is limited in his safe mobility by increased DoE and decreased strength and endurance. Pt is currently mod I for bed mobility, min A for transfers and minA for ambulation of 60 feet with RW. Pt educated in energy conservation to minimize fatigue with mobility. D/c plans continue to remain appropriate.      Follow Up Recommendations  Home health PT;Supervision/Assistance - 24 hour     Equipment Recommendations  None recommended by PT    Recommendations for Other Services OT consult     Precautions / Restrictions Precautions Precautions: Fall Restrictions Weight Bearing Restrictions: No    Mobility  Bed Mobility Overal bed mobility: Modified Independent             General bed mobility comments: increased time and use of bedrail   Transfers Overall transfer level: Needs assistance Equipment used: Rolling walker (2 wheeled) Transfers: Sit to/from Stand Sit to Stand: Min assist         General transfer comment: minA for power up and steadying in RW  Ambulation/Gait Ambulation/Gait assistance: Min assist Ambulation Distance (Feet): 60 Feet Assistive device: Rolling walker (2 wheeled) Gait Pattern/deviations: Step-through pattern;Decreased step length - right;Decreased step length - left;Shuffle;Wide base of support;Trunk flexed Gait velocity: slowed Gait velocity interpretation: <1.8 ft/sec, indicate of risk  for recurrent falls General Gait Details: minA for management of RW, pt able to stay within RW with gait today, after walking 30 feet pt with 3/4 DoE and request to return to room         Balance Overall balance assessment: Needs assistance Sitting-balance support: Feet supported;No upper extremity supported Sitting balance-Leahy Scale: Fair     Standing balance support: Bilateral upper extremity supported Standing balance-Leahy Scale: Poor Standing balance comment: utilizes RW                            Cognition Arousal/Alertness: Awake/alert Behavior During Therapy: WFL for tasks assessed/performed Overall Cognitive Status: Within Functional Limits for tasks assessed                                 General Comments: most likely baseline deficits      Exercises      General Comments General comments (skin integrity, edema, etc.): at rest SaO2 on RA 96%O2, HR 96 bpm, with ambulation HR max 138 bpm, SaO2 on RA 90%O2,       Pertinent Vitals/Pain Pain Assessment: Faces Faces Pain Scale: Hurts a little bit Pain Location: bilateral LE R>L with weightbearing Pain Descriptors / Indicators: Guarding;Grimacing Pain Intervention(s): Limited activity within patient's tolerance;Monitored during session    Home Living Family/patient expects to be discharged to:: Private residence Living Arrangements: Spouse/significant other;Children Available Help at Discharge: Family;Available 24 hours/day Type of Home: House Home Access: Stairs to enter Entrance Stairs-Rails: Right;Left Home  Layout: One level Home Equipment: Walker - 2 wheels;Shower seat;Cane - single point Additional Comments: Pt lives with wife, who is in good health, and daughter, who is a brittle diabetic     Prior Function Level of Independence: Needs assistance  Gait / Transfers Assistance Needed: requires RW for limited household ambulation ADL's / Homemaking Assistance Needed: secondary to  vision loss wife assist with ADLs, and iADLs Comments: since onset of vision loss, wife has been assisting with all ADLs.  Son reports pt is very sedentary, he spends most of his time in room and watching golf.   PT Goals (current goals can now be found in the care plan section) Acute Rehab PT Goals Patient Stated Goal: feel better PT Goal Formulation: With patient/family Time For Goal Achievement: 02/03/18 Potential to Achieve Goals: Fair Progress towards PT goals: Progressing toward goals    Frequency    Min 3X/week      PT Plan Current plan remains appropriate       AM-PAC PT "6 Clicks" Daily Activity  Outcome Measure  Difficulty turning over in bed (including adjusting bedclothes, sheets and blankets)?: A Little Difficulty moving from lying on back to sitting on the side of the bed? : A Little Difficulty sitting down on and standing up from a chair with arms (e.g., wheelchair, bedside commode, etc,.)?: Unable Help needed moving to and from a bed to chair (including a wheelchair)?: A Little Help needed walking in hospital room?: A Little Help needed climbing 3-5 steps with a railing? : Total 6 Click Score: 14    End of Session Equipment Utilized During Treatment: Gait belt;Oxygen Activity Tolerance: Patient limited by fatigue;Patient limited by pain Patient left: in chair;with call bell/phone within reach;with chair alarm set;with family/visitor present Nurse Communication: Mobility status PT Visit Diagnosis: Unsteadiness on feet (R26.81);Other abnormalities of gait and mobility (R26.89);Muscle weakness (generalized) (M62.81);Difficulty in walking, not elsewhere classified (R26.2);Pain Pain - Right/Left: (bilateral) Pain - part of body: Leg     Time: 0459-9774 PT Time Calculation (min) (ACUTE ONLY): 15 min  Charges:  $Gait Training: 8-22 mins                    G Codes:       Marinda Tyer B. Migdalia Dk PT, DPT Acute Rehabilitation  [phone] Pager [phone]    [place] 01/21/2018, 3:54 PM

## 2018-01-22 LAB — BASIC METABOLIC PANEL
Anion gap: 12 (ref 5–15)
BUN: 72 mg/dL — AB (ref 6–20)
CHLORIDE: 97 mmol/L — AB (ref 101–111)
CO2: 27 mmol/L (ref 22–32)
CREATININE: 2.1 mg/dL — AB (ref 0.61–1.24)
Calcium: 8.2 mg/dL — ABNORMAL LOW (ref 8.9–10.3)
GFR, EST AFRICAN AMERICAN: 30 mL/min — AB (ref >60)
GFR, EST NON AFRICAN AMERICAN: 26 mL/min — AB (ref >60)
Glucose, Bld: 145 mg/dL — ABNORMAL HIGH (ref 65–99)
Potassium: 3.9 mmol/L (ref 3.5–5.1)
SODIUM: 136 mmol/L (ref 135–145)

## 2018-01-22 MED ORDER — FUROSEMIDE 10 MG/ML IJ SOLN
60.0000 mg | Freq: Three times a day (TID) | INTRAMUSCULAR | Status: DC
  Administered 2018-01-22 – 2018-01-23 (×2): 60 mg via INTRAVENOUS
  Filled 2018-01-22 (×3): qty 6

## 2018-01-22 NOTE — Progress Notes (Addendum)
PROGRESS NOTE                                                                                                                                                                                                             Patient Demographics:    [name], is a 84 y.o. male, DOB - August 11, 1925, GBT:[id number]  Admit date - 01/19/2018   Admitting Physician Karmen Bongo, MD  Outpatient Primary MD for the patient is [name], MD  LOS - 3   Chief Complaint  Patient presents with  . Leg Pain       Brief Narrative   84 y.o. male with medical history significant of renal cell CA; CKD; PVD; HTN; COPD; HLD; h/o CVA; and BPH presenting with leg pain, worsening lower extremity edema and shortness of breath, work-up significant for CHF exacerbation.   Subjective:    [name] today has, No headache, No chest pain, No abdominal pain , denies lower extremity pain today, reports is feeling better today  Assessment  & Plan :    Principal Problem:   SOB (shortness of breath) Active Problems:   Hyperlipidemia   Essential hypertension   COPD (chronic obstructive pulmonary disease) ([place])   Smoker unmotivated to quit   Temporal arteritis (HCC)   Bilateral lower extremity edema   Acute kidney injury superimposed on CKD ([facility])   Elevated lactic acid level   Hyperglycemia   Pressure injury of skin   Acute on chronic diastolic CHF -echo this admission showing preserved EF at 71%, grade 1 diastolic dysfunction -Presents with marked lower extremity edema, dyspnea, has elevated JVD, elevated proBNP, volume overload on chest x-ray. -This is improved after increasing his Lasix 40 mg 3 times daily, 1.9 L since admission,(weight likely inaccurate 74>76), I will increase his evening dose to 60 mg, as long renal function remained stable, continue with daily weight, strict signs and outs, low-salt diet.  BLE edema -Due to CHF, venous stasis, venous Doppler  negative for DVT - on TED hose  Elevated lactate -Due to CHF, toxic appearing, no evidence of infection  AKI on CKD stage IV -Baseline creatinine 1.8, it is 2.2 on presentation, renal function remained stable so far, continue to monitor closely as on IV diuresis -Patient has one kidney  COPD, still smoking -Does not appear to be having significant  wheezing, on chronic steroids, continue with home dose, continue with as needed nebs.  Tobacco abuse -Encourage smoking cessation - although, realistically, at 43 with an ongoing will to smoke, this may be a losing battle  HTN -Pressure is acceptable, resume on amlodipine today  Hyperlipidemia -Continue with statin  Temporal arteritis -Continue steroids -He is, unfortunately, blind as a consequence      Code Status : DNR  Family Communication  : Wife at bedside  Disposition Plan  : Consult PT  Consults  :  None  Procedures  : none  DVT Prophylaxis  : [place] lovenox  Lab Results  Component Value Date   PLT 181 01/20/2018    Antibiotics  :    Anti-infectives (From admission, onward)   Start     Dose/Rate Route Frequency Ordered Stop   01/19/18 1245  cefTRIAXone (ROCEPHIN) 1 g in sodium chloride 0.9 % 100 mL IVPB     1 g 200 mL/hr over 30 Minutes Intravenous  Once 01/19/18 1234 01/19/18 1345   01/19/18 1245  azithromycin (ZITHROMAX) 500 mg in sodium chloride 0.9 % 250 mL IVPB     500 mg 250 mL/hr over 60 Minutes Intravenous  Once 01/19/18 1234 01/19/18 1609        Objective:   Vitals:   01/22/18 0026 01/22/18 0629 01/22/18 0733 01/22/18 0825  BP:    122/60  Pulse: 83   (!) 102  Resp:    (!) 22  Temp:    97.6 F (36.4 C)  TempSrc:    Oral  SpO2: 93%  94% (!) 89%  Weight:  76.7 kg (169 lb)    Height:        Wt Readings from Last 3 Encounters:  01/22/18 76.7 kg (169 lb)  01/13/18 74.8 kg (165 lb)  12/03/17 80.3 kg (177 lb)     Intake/Output Summary (Last 24 hours) at 01/22/2018 1414 Last data filed  at 01/22/2018 1300 Gross per 24 hour  Intake 1075 ml  Output 3600 ml  Net -2525 ml     Physical Exam  Awake alert, pleasant and appropriate, extremely hard of hearing, very poor vision at baseline Remains with +JVD With good air entry bilaterally, clear to auscultation with no use of accessory muscles RRR,No Gallops,Rubs or new Murmurs, No Parasternal Heave +ve B.Sounds, Abd Soft, No tenderness, No rebound - guarding or rigidity. No Cyanosis, Clubbing , remains with different edema, but it is improving, No new Rash or bruise      Data Review:    CBC Recent Labs  Lab 01/19/18 1019 01/20/18 0227  WBC 9.7 9.9  HGB 12.4* 11.3*  HCT 37.0* 33.7*  PLT 188 181  MCV 97.1 97.1  MCH 32.5 32.6  MCHC 33.5 33.5  RDW 18.8* 18.7*  LYMPHSABS 1.5 0.9  MONOABS 0.4 0.2  EOSABS 0.1 0.0  BASOSABS 0.0 0.1    Chemistries  Recent Labs  Lab 01/19/18 1019 01/20/18 0227 01/21/18 0330 01/22/18 0108  NA 139 138 139 136  K 4.2 4.9 3.9 3.9  CL 102 102 101 97*  CO2 24 25 26 27   GLUCOSE 135* 232* 146* 145*  BUN 74* 75* 77* 72*  CREATININE 2.11* 2.21* 2.22* 2.10*  CALCIUM 8.3* 8.0* 8.2* 8.2*  AST 18  --   --   --   ALT 27  --   --   --   ALKPHOS 51  --   --   --   BILITOT 0.8  --   --   --    ------------------------------------------------------------------------------------------------------------------  No results for input(s): CHOL, HDL, LDLCALC, TRIG, CHOLHDL, LDLDIRECT in the last 72 hours.  Lab Results  Component Value Date   HGBA1C 5.5 10/26/2017   ------------------------------------------------------------------------------------------------------------------ No results for input(s): TSH, T4TOTAL, T3FREE, THYROIDAB in the last 72 hours.  Invalid input(s): FREET3 ------------------------------------------------------------------------------------------------------------------ No results for input(s): VITAMINB12, FOLATE, FERRITIN, TIBC, IRON, RETICCTPCT in the last 72  hours.  Coagulation profile No results for input(s): INR, PROTIME in the last 168 hours.  No results for input(s): DDIMER in the last 72 hours.  Cardiac Enzymes No results for input(s): CKMB, TROPONINI, MYOGLOBIN in the last 168 hours.  Invalid input(s): CK ------------------------------------------------------------------------------------------------------------------    Component Value Date/Time   BNP 226.0 (H) 01/19/2018 1444    Inpatient Medications  Scheduled Meds: . allopurinol  100 mg Oral Daily  . aspirin EC  81 mg Oral Daily  . enoxaparin (LOVENOX) injection  30 mg Subcutaneous Q24H  . furosemide  60 mg Intravenous Q8H  . ipratropium-albuterol  3 mL Nebulization BID  . mouth rinse  15 mL Mouth Rinse BID  . mirtazapine  15 mg Oral QHS  . pantoprazole  40 mg Oral Daily  . pravastatin  40 mg Oral QPM  . predniSONE  10 mg Oral Q breakfast   And  . prednisoLONE  2.5 mg Oral QPM  . sodium chloride flush  3 mL Intravenous Q12H   Continuous Infusions: . sodium chloride     PRN Meds:.sodium chloride, acetaminophen, albuterol, guaiFENesin-dextromethorphan, ondansetron (ZOFRAN) IV, polyethylene glycol, sodium chloride flush  Micro Results Recent Results (from the past 240 hour(s))  Blood culture (routine x 2)     Status: None (Preliminary result)   Collection Time: 01/19/18  2:44 PM  Result Value Ref Range Status   Specimen Description BLOOD BLOOD LEFT FOREARM  Final   Special Requests   Final    BOTTLES DRAWN AEROBIC AND ANAEROBIC Blood Culture adequate volume   Culture   Final    NO GROWTH 3 DAYS Performed at [facility], [place] [address], [place], [place] [address]    Report Status PENDING  Incomplete  Blood culture (routine x 2)     Status: None (Preliminary result)   Collection Time: 01/19/18  6:33 PM  Result Value Ref Range Status   Specimen Description BLOOD RIGHT ARM  Final   Special Requests   Final    BOTTLES DRAWN AEROBIC ONLY Blood Culture  adequate volume   Culture   Final    NO GROWTH 3 DAYS Performed at [facility], [place] [address], [place], [place] [address]    Report Status PENDING  Incomplete    Radiology Reports Dg Chest 2 View  Result Date: 01/19/2018 CLINICAL DATA:  Cough and pneumonia EXAM: CHEST - 2 VIEW COMPARISON:  11/26/2017 FINDINGS: Increased bilateral interstitial opacities consistent with mild interstitial pulmonary edema. No pleural effusion or pneumothorax. No focal consolidation. Cardiomediastinal contours are normal. Calcific aortic atherosclerosis is unchanged. IMPRESSION: Mild interstitial pulmonary edema. Electronically Signed   By: Ulyses Jarred M.D.   On: 01/19/2018 13:55     Phillips Climes M.D on 01/22/2018 at 2:14 PM  Between 7am to 7pm - Pager - [phone]  After 7pm go to [email or web address] - password [facility]  [facility] -  Office  [phone]

## 2018-01-22 NOTE — Plan of Care (Signed)
Discussed plan of care with patient.  Stressed the importance of taking medications as prescribed.  Some teach back was displayed.

## 2018-01-23 LAB — BASIC METABOLIC PANEL
Anion gap: 12 (ref 5–15)
BUN: 66 mg/dL — ABNORMAL HIGH (ref 6–20)
CALCIUM: 8.3 mg/dL — AB (ref 8.9–10.3)
CO2: 30 mmol/L (ref 22–32)
Chloride: 95 mmol/L — ABNORMAL LOW (ref 101–111)
Creatinine, Ser: 2.2 mg/dL — ABNORMAL HIGH (ref 0.61–1.24)
GFR, EST AFRICAN AMERICAN: 28 mL/min — AB (ref >60)
GFR, EST NON AFRICAN AMERICAN: 25 mL/min — AB (ref >60)
Glucose, Bld: 99 mg/dL (ref 65–99)
Potassium: 3.4 mmol/L — ABNORMAL LOW (ref 3.5–5.1)
Sodium: 137 mmol/L (ref 135–145)

## 2018-01-23 MED ORDER — POTASSIUM CHLORIDE CRYS ER 20 MEQ PO TBCR
40.0000 meq | EXTENDED_RELEASE_TABLET | Freq: Once | ORAL | Status: AC
  Administered 2018-01-23: 40 meq via ORAL
  Filled 2018-01-23: qty 2

## 2018-01-23 MED ORDER — FUROSEMIDE 40 MG PO TABS
60.0000 mg | ORAL_TABLET | Freq: Once | ORAL | Status: AC
  Administered 2018-01-23: 60 mg via ORAL
  Filled 2018-01-23: qty 1

## 2018-01-23 MED ORDER — TRAZODONE HCL 50 MG PO TABS
50.0000 mg | ORAL_TABLET | Freq: Once | ORAL | Status: AC
  Administered 2018-01-23: 50 mg via ORAL
  Filled 2018-01-23: qty 1

## 2018-01-23 MED ORDER — FUROSEMIDE 20 MG PO TABS: 40.0000 mg | ORAL_TABLET | Freq: Every day | ORAL | 0 refills | Status: AC

## 2018-01-23 MED ORDER — POTASSIUM CHLORIDE CRYS ER 20 MEQ PO TBCR
20.0000 meq | EXTENDED_RELEASE_TABLET | Freq: Once | ORAL | Status: AC
  Administered 2018-01-23: 20 meq via ORAL
  Filled 2018-01-23: qty 1

## 2018-01-23 NOTE — Plan of Care (Signed)
Discussed plan of care with patient and his wife.  Patient is expecting to be discharged tomorrow.  Wife inquired if swelling in husband's feet was related to CHF.  I explained that was and lasix is prescribed to help reduce that swelling. Teach back was displayed.

## 2018-01-23 NOTE — Discharge Instructions (Signed)
Follow with Primary MD [name], MD in 7 days   Get CBC, CMP, 2 view Chest X ray checked  by Primary MD next visit.    Activity: As tolerated with Full fall precautions use walker/cane & assistance as needed   Disposition Home    Diet: Heart Healthy , 1500 cc fluid restrictions, with feeding assistance and aspiration precautions.  For Heart failure patients - Check your Weight same time everyday, if you gain over 2 pounds, or you develop in leg swelling, experience more shortness of breath or chest pain, call your Primary MD immediately. Follow Cardiac Low Salt Diet and 1.5 lit/day fluid restriction.   On your next visit with your primary care physician please Get Medicines reviewed and adjusted.   Please request your Prim.MD to go over all Hospital Tests and Procedure/Radiological results at the follow up, please get all Hospital records sent to your Prim MD by signing hospital release before you go home.   If you experience worsening of your admission symptoms, develop shortness of breath, life threatening emergency, suicidal or homicidal thoughts you must seek medical attention immediately by calling 911 or calling your MD immediately  if symptoms less severe.  You Must read complete instructions/literature along with all the possible adverse reactions/side effects for all the Medicines you take and that have been prescribed to you. Take any new Medicines after you have completely understood and accpet all the possible adverse reactions/side effects.   Do not drive, operating heavy machinery, perform activities at heights, swimming or participation in water activities or provide baby sitting services if your were admitted for syncope or siezures until you have seen by Primary MD or a Neurologist and advised to do so again.  Do not drive when taking Pain medications.    Do not take more than prescribed Pain, Sleep and Anxiety Medications  Special Instructions: If you have  smoked or chewed Tobacco  in the last 2 yrs please stop smoking, stop any regular Alcohol  and or any Recreational drug use.  Wear Seat belts while driving.   Please note  You were cared for by a hospitalist during your hospital stay. If you have any questions about your discharge medications or the care you received while you were in the hospital after you are discharged, you can call the unit and asked to speak with the hospitalist on call if the hospitalist that took care of you is not available. Once you are discharged, your primary care physician will handle any further medical issues. Please note that NO REFILLS for any discharge medications will be authorized once you are discharged, as it is imperative that you return to your primary care physician (or establish a relationship with a primary care physician if you do not have one) for your aftercare needs so that they can reassess your need for medications and monitor your lab values.

## 2018-01-23 NOTE — Discharge Summary (Signed)
[name], is a 84 y.o. male  DOB 01-Dec-1924  MRN [id number].  Admission date:  01/19/2018  Admitting Physician  Karmen Bongo, MD  Discharge Date:  01/23/2018   Primary MD  [name], MD  Recommendations for primary care physician for things to follow:  -Please check CBC, BMP during next visit   Admission Diagnosis  Lactic acidosis [E87.2] Leg swelling [M79.89] Hypoxia [R09.02]   Discharge Diagnosis  Lactic acidosis [E87.2] Leg swelling [M79.89] Hypoxia [R09.02]    Principal Problem:   SOB (shortness of breath) Active Problems:   Hyperlipidemia   Essential hypertension   COPD (chronic obstructive pulmonary disease) ([place])   Smoker unmotivated to quit   Temporal arteritis (HCC)   Bilateral lower extremity edema   Acute kidney injury superimposed on CKD (HCC)   Elevated lactic acid level   Hyperglycemia   Pressure injury of skin      Past Medical History:  Diagnosis Date  . ALLERGIC RHINITIS 09/29/2007  . Aortic stenosis 02/05/2017  . Arthritis    "was in his knees" (01/19/2018)  . BACK PAIN 01/31/2009  . BENIGN PROSTATIC HYPERTROPHY 05/12/2007  . CHEST PAIN 01/31/2008  . CHRONIC OBSTRUCTIVE PULMONARY DISEASE, ACUTE EXACERBATION 08/17/2007  . CONJUNCTIVITIS, ALLERGIC, CHRONIC 03/24/2010  . CONSTIPATION 02/19/2010  . COPD 05/12/2007  . CVA (cerebral vascular accident) ([place]) 05/03/2011  . GERD 02/23/2010  . GOUT 07/04/2010  . Heart murmur   . History of kidney stones    "passed it"  . History of stomach ulcers   . HYPERLIPIDEMIA 05/12/2007  . HYPERTENSION 05/12/2007  . NEPHROLITHIASIS, HX OF   . OBESITY 05/12/2007  . PEPTIC ULCER DISEASE 05/12/2007  . PERIPHERAL VASCULAR DISEASE 05/12/2007  . Personal history of unspecified circulatory disease 05/12/2007  . PLANTAR FASCIITIS, RIGHT 09/27/2008  . RENAL CELL CANCER 05/12/2007  . RENAL INSUFFICIENCY 08/17/2007  . SKIN LESION 11/07/2010  .  VERTIGO 09/29/2007  . WRIST PAIN, RIGHT 02/26/2009    Past Surgical History:  Procedure Laterality Date  . ARTERY BIOPSY Bilateral 10/27/2017   Procedure: BILATERAL TEMPORAL ARTERY BIOPSY;  Surgeon: Serafina Mitchell, MD;  Location: [facility] OR;  Service: Vascular;  Laterality: Bilateral;  . CATARACT EXTRACTION W/ INTRAOCULAR LENS  IMPLANT, BILATERAL Bilateral   . FEMORAL-POPLITEAL BYPASS GRAFT Bilateral   . INNER EAR SURGERY Bilateral    "had it twice in one of his ears; once in the other"  . JOINT REPLACEMENT    . NEPHRECTOMY    . NEPHROURETERECTOMY    . TOTAL KNEE ARTHROPLASTY Left 06/2007  . TOTAL KNEE ARTHROPLASTY Right        History of present illness and  Hospital Course:     Kindly see H&P for history of present illness and admission details, please review complete Labs, Consult reports and Test reports for all details in brief  HPI  from the history and physical done on the day of admission HPI: [name] is a 84 y.o. male with medical history significant of renal cell CA;  CKD; PVD; HTN; COPD; HLD; h/o CVA; and Chavez presenting with leg pain. Back in March, he had temporal arteritis (given steroids) and he has resultant right eye blindness and mostly blindness in the left eye (he can see a silhouette but can no longer see the TV, read, etc).  He uses a urinal or potty chair because he can't walk.  He started having pain in his right leg 2-3 days ago.  His leg started weeping again.  He also has a cough.  They have been cutting back on his steroids because he has been swelling and this led to improvement in the swelling and weeping.  He had a CXR yesterday and "they weren't sure what it was."  It really is just the right leg that is painful, although they have been both swollen.  They deny h/o CHF.  He does have aortic stenosis.  The cough is persistent but has worsened for the last few days.  Cough has been productive but they aren't sure what it looks like.  He has not been able to  spit anything up for a couple of days but he usually spits the sputum into his urinal with his urine and his wife pours it down the drain.  No fevers at home.  No rhinorrhea or congestion.  Last week, the PCP diagnosed an ear infection and put some drops in it but he did not need a prescription.   ED Course:  Leg pain/swelling, SOB.  Sepsis w/u - lactate 2.7, 3.9.  ?infection.  CXR with edema, R base crackles.  DVT US negative.  BNP is down, lactate up, given bolus.  Given Rocephin/azithro.  Sats just went down.  Now with crackles so given steroids.      Hospital Course  [name] presenting with leg pain, worsening lower extremity edema and shortness of breath, work-up significant for CHF exacerbation.    Acute on chronic diastolic CHF -echo this admission showing preserved EF at 10%, grade 1 diastolic dysfunction -Presents with marked lower extremity edema, dyspnea, has elevated JVD, elevated proBNP, volume overload on chest x-ray. -Currently improved with IV diuresis, he -4 L during hospital stay, function remained stable, his home dose Lasix was increased from 20 to 40 mg oral daily, discussed with wife at length, instructed about fluid restriction, low-salt diet, daily weights .  BLE edema -Due to CHF, venous stasis, venous Doppler negative for DVT - on TED hose  Elevated lactate -Due to CHF, toxic appearing, no evidence of infection  AKI on CKD stage IV -Baseline creatinine 1.8, it is 2.2 on presentation, renal function remained stable so far,-Patient has one kidney  COPD, still smoking -Does not appear to be having significant wheezing, on chronic steroids, continue with home dose, continue with as needed nebs.  Tobacco abuse -Encourage smoking cessation   HTN -Pressure is acceptable, resume on amlodipine today  Hyperlipidemia -Continue with statin  Temporal  arteritis -Continue steroids -He is, unfortunately, blind as a consequence  Hypokalemia -Repleted      Discharge Condition:  Stable   Follow UP  Follow-up Information    [name], MD Follow up in 1 week(s).   Specialties:  Internal Medicine, Radiology Contact information: [place] [address] [phone]        [name], MD .   Specialty:  Cardiology Contact information: [address] [place] [address] [phone]             Discharge Instructions  and  Discharge Medications     Discharge Instructions    AMB Referral to [place] Management   Complete by:  As directed    Please assign to [facility] for CHF, COPD. PCP (Elloree Elam listed as doing toc). Written consent obtained. Wife- Leandro Berkowitz [phone] to be contacted for post discharge calls. To discharge from [facility]/21/18. To have [facility] for home health. Please call with questions. Thanks Marthenia Rolling, [place], [facility] [phone]   Reason for consult:  Please assign to [facility] RNCM   Diagnoses of:   COPD/ Pneumonia Heart Failure Other     Other Diagnosis:  HTN   Expected date of contact:  1-3 days (reserved for hospital discharges)   Diet - low sodium heart healthy   Complete by:  As directed    Increase activity slowly   Complete by:  As directed      Allergies as of 01/23/2018      Reactions   Sulfonamide Derivatives Rash      Medication List    TAKE these medications   allopurinol 100 MG tablet Commonly known as:  ZYLOPRIM TAKE 1 TABLET EVERY DAY   amLODipine 5 MG tablet Commonly known as:  NORVASC TAKE 1 TABLET (5 MG TOTAL) BY MOUTH DAILY.   aspirin EC 81 MG tablet Take 1 tablet (81 mg total) by mouth daily.   cetirizine 10 MG tablet Commonly known as:  ZYRTEC Take 10 mg by mouth daily as needed for allergies.   docusate sodium 100 MG capsule Commonly  known as:  COLACE Take 300 mg by mouth daily as needed for mild constipation.   furosemide 20 MG tablet Commonly known as:  LASIX Take 2 tablets (40 mg total) by mouth daily. What changed:  how much to take   guaiFENesin 600 MG 12 hr tablet Commonly known as:  MUCINEX Take 600 mg by mouth 2 (two) times daily as needed for cough or to loosen phlegm.   mirtazapine 15 MG tablet Commonly known as:  REMERON Take 1 tablet (15 mg total) by mouth at bedtime.   omeprazole 40 MG capsule Commonly known as:  PRILOSEC Take 1 capsule (40 mg total) by mouth daily.   polyethylene glycol packet Commonly known as:  MIRALAX / GLYCOLAX Take 17 g by mouth daily as needed for mild constipation.   pravastatin 40 MG tablet Commonly known as:  PRAVACHOL TAKE 1 TABLET EVERY EVENING What changed:    how much to take  how to take this  when to take this   predniSONE 5 MG tablet Commonly known as:  DELTASONE 10 mg in the morning and 2.5mg  at night.         Diet and Activity recommendation: See Discharge Instructions above   Consults obtained -  none   Major procedures and Radiology Reports - PLEASE review detailed and final reports for all details, in brief -     Dg Chest 2 View  Result Date: 01/19/2018 CLINICAL DATA:  Cough and pneumonia EXAM: CHEST - 2 VIEW COMPARISON:  11/26/2017 FINDINGS: Increased bilateral interstitial opacities consistent with mild interstitial pulmonary edema. No pleural effusion or pneumothorax. No focal consolidation. Cardiomediastinal contours are normal. Calcific aortic atherosclerosis is unchanged. IMPRESSION: Mild interstitial pulmonary edema. Electronically Signed   By: Ulyses Jarred M.D.   On: 01/19/2018 13:55  Micro Results    Recent Results (from the past 240 hour(s))  Blood culture (routine x 2)     Status: None (Preliminary result)   Collection Time: 01/19/18  2:44 PM  Result Value Ref Range Status   Specimen Description BLOOD BLOOD LEFT  FOREARM  Final   Special Requests   Final    BOTTLES DRAWN AEROBIC AND ANAEROBIC Blood Culture adequate volume   Culture   Final    NO GROWTH 4 DAYS Performed at [facility], [address] [place], [place] [address]    Report Status PENDING  Incomplete  Blood culture (routine x 2)     Status: None (Preliminary result)   Collection Time: 01/19/18  6:33 PM  Result Value Ref Range Status   Specimen Description BLOOD RIGHT ARM  Final   Special Requests   Final    BOTTLES DRAWN AEROBIC ONLY Blood Culture adequate volume   Culture   Final    NO GROWTH 4 DAYS Performed at [facility], [place] [address], [place], [place] [address]    Report Status PENDING  Incomplete       Today   Subjective:   [name] today has no headache,no chest abdominal pain, has any shortness of breath, on room air, reports he is feeling much better Objective:   Blood pressure 123/66, pulse 86, temperature 98.2 F (36.8 C), temperature source Oral, resp. rate 16, height 5\' 5"  (1.651 m), weight 72.1 kg (159 lb), SpO2 90 %.   Intake/Output Summary (Last 24 hours) at 01/23/2018 1144 Last data filed at 01/23/2018 0626 Gross per 24 hour  Intake 820 ml  Output 2700 ml  Net -1880 ml    Exam Awake Alert, Oriented x 3, No new F.N deficits, Normal affect Supple Neck,No JVD,.  Symmetrical Chest wall movement, Good air movement bilaterally, CTAB RRR,No Gallops,Rubs  No Parasternal Heave +ve B.Sounds, Abd Soft, Non tender,No rebound -guarding or rigidity. No Cyanosis, Clubbing , +1edema, No new Rash or bruise  Data Review   CBC w Diff:  Lab Results  Component Value Date   WBC 9.9 01/20/2018   HGB 11.3 (L) 01/20/2018   HGB 13.7 02/19/2012   HCT 33.7 (L) 01/20/2018   HCT 39.6 02/19/2012   PLT 181 01/20/2018   PLT 266 02/19/2012   LYMPHOPCT 9 01/20/2018   LYMPHOPCT 37.1 02/19/2012   MONOPCT 2 01/20/2018   MONOPCT 11.2 02/19/2012   EOSPCT 0 01/20/2018   EOSPCT 2.8 02/19/2012    BASOPCT 1 01/20/2018   BASOPCT 0.3 02/19/2012    CMP:  Lab Results  Component Value Date   NA 137 01/23/2018   K 3.4 (L) 01/23/2018   CL 95 (L) 01/23/2018   CO2 30 01/23/2018   BUN 66 (H) 01/23/2018   CREATININE 2.20 (H) 01/23/2018   PROT 5.5 (L) 01/19/2018   ALBUMIN 2.6 (L) 01/19/2018   BILITOT 0.8 01/19/2018   ALKPHOS 51 01/19/2018   AST 18 01/19/2018   ALT 27 01/19/2018  .   Total Time in preparing paper work, data evaluation and todays exam - 22 minutes  Phillips Climes M.D on 01/23/2018 at 11:44 AM  [facility]   Office  [phone]

## 2018-01-24 LAB — CULTURE, BLOOD (ROUTINE X 2)
Culture: NO GROWTH
Culture: NO GROWTH
SPECIAL REQUESTS: ADEQUATE
Special Requests: ADEQUATE

## 2018-01-25 ENCOUNTER — Other Ambulatory Visit: Payer: Self-pay | Admitting: *Deleted

## 2018-01-25 ENCOUNTER — Telehealth: Payer: Self-pay | Admitting: Internal Medicine

## 2018-01-25 ENCOUNTER — Telehealth: Payer: Self-pay | Admitting: *Deleted

## 2018-01-25 DIAGNOSIS — K219 Gastro-esophageal reflux disease without esophagitis: Secondary | ICD-10-CM | POA: Diagnosis not present

## 2018-01-25 DIAGNOSIS — N184 Chronic kidney disease, stage 4 (severe): Secondary | ICD-10-CM | POA: Diagnosis not present

## 2018-01-25 DIAGNOSIS — J449 Chronic obstructive pulmonary disease, unspecified: Secondary | ICD-10-CM | POA: Diagnosis not present

## 2018-01-25 DIAGNOSIS — M316 Other giant cell arteritis: Secondary | ICD-10-CM | POA: Diagnosis not present

## 2018-01-25 DIAGNOSIS — H543 Unqualified visual loss, both eyes: Secondary | ICD-10-CM | POA: Diagnosis not present

## 2018-01-25 DIAGNOSIS — I130 Hypertensive heart and chronic kidney disease with heart failure and stage 1 through stage 4 chronic kidney disease, or unspecified chronic kidney disease: Secondary | ICD-10-CM | POA: Diagnosis not present

## 2018-01-25 DIAGNOSIS — I5033 Acute on chronic diastolic (congestive) heart failure: Secondary | ICD-10-CM | POA: Diagnosis not present

## 2018-01-25 DIAGNOSIS — L89322 Pressure ulcer of left buttock, stage 2: Secondary | ICD-10-CM | POA: Diagnosis not present

## 2018-01-25 DIAGNOSIS — D631 Anemia in chronic kidney disease: Secondary | ICD-10-CM | POA: Diagnosis not present

## 2018-01-25 NOTE — Telephone Encounter (Signed)
Notified Maria w/MD response. Added meclizine to med list. Also she states pt has a small sore on his (L) buttock area. Wanting to know if its ok to put some duoderm cream on area. Inform Verdis Frederickson that would be ok.Marland KitchenJohny Chess

## 2018-01-25 NOTE — Telephone Encounter (Signed)
Transition Care Management Follow-up Telephone Call   Date discharged? 01/23/18  How have you been since you were released from the hospital? Wife states husband is doing alright   Do you understand why you were in the hospital? YES   Do you understand the discharge instructions? YES   Where were you discharged to? Home   Items Reviewed:  Medications reviewed: YES, pt states they increase his lasix to 40 mg  Allergies reviewed: YES  Dietary changes reviewed: YES  Referrals reviewed: No referral needed   Functional Questionnaire:   Activities of Daily Living (ADLs):   she states he are independent in the following: bathing and hygiene, feeding, continence, grooming, toileting and dressing States they require assistance with the following: ambulation   Any transportation issues/concerns?: NO   Any patient concerns? NO   Confirmed importance and date/time of follow-up visits scheduled YES, appt 01/31/18  Provider Appointment booked with Dr. Jenny Reichmann  Confirmed with patient if condition begins to worsen call PCP or go to the ER.  Patient was given the office number and encouraged to call back with question or concerns.  : YES

## 2018-01-25 NOTE — Telephone Encounter (Signed)
Copied from [place] [phone]. Topic: Quick Communication - See Telephone Encounter >> Jan 25, 2018  1:02 PM Arletha Grippe wrote: CRM for notification. See Telephone encounter for: 01/25/18. [facility]  Verbal for nursing for monitoring heart failure  1 week 9   Pt is taking meclizine 1 per day, it was on his discharge ppw. Fyi  Cb is [phone]

## 2018-01-25 NOTE — Telephone Encounter (Signed)
Ok for verbals if needed 

## 2018-01-25 NOTE — Patient Outreach (Signed)
[facility] Care Management  01/25/2018  [name] 1924/10/11 [id number]   Referral received from hospital liaison as member was recently discharged from hospital with lactic acidosis, leg swelling, and hypoxia.  Per chart he also has history of hypertension, aortic stenosis, COPD, and GERD.  Noted that member's primary MD office will complete transition of care assessment.  Call placed to member's wife, [name], per request.  Member's identity verified.  She report that member is involved with [facility]. This care manager discussed the difference between [facility] and home health.  She again declines offer for [facility] involvement, state [facility] will be involved for at least 9 weeks.  She state she will contact this care manager if needs change and they decide to engage with [facility].  Will close case at this time.  Valente David, [place], MSN [place] [phone]

## 2018-01-28 DIAGNOSIS — J449 Chronic obstructive pulmonary disease, unspecified: Secondary | ICD-10-CM | POA: Diagnosis not present

## 2018-01-28 DIAGNOSIS — K219 Gastro-esophageal reflux disease without esophagitis: Secondary | ICD-10-CM | POA: Diagnosis not present

## 2018-01-28 DIAGNOSIS — M316 Other giant cell arteritis: Secondary | ICD-10-CM | POA: Diagnosis not present

## 2018-01-28 DIAGNOSIS — L89322 Pressure ulcer of left buttock, stage 2: Secondary | ICD-10-CM | POA: Diagnosis not present

## 2018-01-28 DIAGNOSIS — I130 Hypertensive heart and chronic kidney disease with heart failure and stage 1 through stage 4 chronic kidney disease, or unspecified chronic kidney disease: Secondary | ICD-10-CM | POA: Diagnosis not present

## 2018-01-28 DIAGNOSIS — D631 Anemia in chronic kidney disease: Secondary | ICD-10-CM | POA: Diagnosis not present

## 2018-01-28 DIAGNOSIS — I5033 Acute on chronic diastolic (congestive) heart failure: Secondary | ICD-10-CM | POA: Diagnosis not present

## 2018-01-28 DIAGNOSIS — H543 Unqualified visual loss, both eyes: Secondary | ICD-10-CM | POA: Diagnosis not present

## 2018-01-28 DIAGNOSIS — N184 Chronic kidney disease, stage 4 (severe): Secondary | ICD-10-CM | POA: Diagnosis not present

## 2018-01-29 DIAGNOSIS — L89322 Pressure ulcer of left buttock, stage 2: Secondary | ICD-10-CM | POA: Diagnosis not present

## 2018-01-29 DIAGNOSIS — M316 Other giant cell arteritis: Secondary | ICD-10-CM | POA: Diagnosis not present

## 2018-01-29 DIAGNOSIS — K219 Gastro-esophageal reflux disease without esophagitis: Secondary | ICD-10-CM | POA: Diagnosis not present

## 2018-01-29 DIAGNOSIS — D631 Anemia in chronic kidney disease: Secondary | ICD-10-CM | POA: Diagnosis not present

## 2018-01-29 DIAGNOSIS — N184 Chronic kidney disease, stage 4 (severe): Secondary | ICD-10-CM | POA: Diagnosis not present

## 2018-01-29 DIAGNOSIS — I130 Hypertensive heart and chronic kidney disease with heart failure and stage 1 through stage 4 chronic kidney disease, or unspecified chronic kidney disease: Secondary | ICD-10-CM | POA: Diagnosis not present

## 2018-01-29 DIAGNOSIS — J449 Chronic obstructive pulmonary disease, unspecified: Secondary | ICD-10-CM | POA: Diagnosis not present

## 2018-01-29 DIAGNOSIS — H543 Unqualified visual loss, both eyes: Secondary | ICD-10-CM | POA: Diagnosis not present

## 2018-01-29 DIAGNOSIS — I5033 Acute on chronic diastolic (congestive) heart failure: Secondary | ICD-10-CM | POA: Diagnosis not present

## 2018-01-31 ENCOUNTER — Encounter: Payer: Self-pay | Admitting: Internal Medicine

## 2018-01-31 ENCOUNTER — Ambulatory Visit (INDEPENDENT_AMBULATORY_CARE_PROVIDER_SITE_OTHER): Payer: Medicare HMO | Admitting: Internal Medicine

## 2018-01-31 ENCOUNTER — Telehealth: Payer: Self-pay | Admitting: Internal Medicine

## 2018-01-31 ENCOUNTER — Other Ambulatory Visit (INDEPENDENT_AMBULATORY_CARE_PROVIDER_SITE_OTHER): Payer: Medicare HMO

## 2018-01-31 VITALS — BP 124/80 | HR 55 | Temp 97.9°F | Ht 65.0 in | Wt 155.0 lb

## 2018-01-31 DIAGNOSIS — R600 Localized edema: Secondary | ICD-10-CM

## 2018-01-31 DIAGNOSIS — M316 Other giant cell arteritis: Secondary | ICD-10-CM | POA: Diagnosis not present

## 2018-01-31 DIAGNOSIS — J449 Chronic obstructive pulmonary disease, unspecified: Secondary | ICD-10-CM

## 2018-01-31 DIAGNOSIS — R739 Hyperglycemia, unspecified: Secondary | ICD-10-CM | POA: Diagnosis not present

## 2018-01-31 LAB — BASIC METABOLIC PANEL
BUN: 56 mg/dL — AB (ref 6–23)
CO2: 29 meq/L (ref 19–32)
Calcium: 8.6 mg/dL (ref 8.4–10.5)
Chloride: 101 mEq/L (ref 96–112)
Creatinine, Ser: 1.93 mg/dL — ABNORMAL HIGH (ref 0.40–1.50)
GFR: 34.84 mL/min — AB (ref >60.00)
GLUCOSE: 219 mg/dL — AB (ref 70–99)
POTASSIUM: 4.6 meq/L (ref 3.5–5.1)
SODIUM: 137 meq/L (ref 135–145)

## 2018-01-31 NOTE — Assessment & Plan Note (Signed)
stable overall by history and exam, recent data reviewed with pt, and pt to continue medical treatment as before,  to f/u any worsening symptoms or concerns Lab Results  Component Value Date   HGBA1C 5.5 10/26/2017

## 2018-01-31 NOTE — Assessment & Plan Note (Signed)
stable overall by history and exam, and pt to continue medical treatment as before,  to f/u any worsening symptoms or concerns 

## 2018-01-31 NOTE — Patient Instructions (Addendum)
Please continue all other medications as before, and refills have been done if requested.  Please have the pharmacy call with any other refills you may need.  Please continue your efforts at being more active, low cholesterol diet, and weight control.  Please keep your appointments with your specialists as you may have planned - Dr Curt Bears   Please go to the LAB in the Basement (turn left off the elevator) for the tests to be done today  You will be contacted by phone if any changes need to be made immediately.  Otherwise, you will receive a letter about your results with an explanation, but please check with MyChart first.  Please remember to sign up for MyChart if you have not done so, as this will be important to you in the future with finding out test results, communicating by private email, and scheduling acute appointments online when needed.

## 2018-01-31 NOTE — Progress Notes (Signed)
Subjective:    Patient ID: [name], male    DOB: 09/20/1924, 84 y.o.   MRN: [id number]  HPI [name] a 84 y.o.malewith medical history significant ofrenal cell CA; CKD; PVD; HTN; COPD; HLD; h/o CVA; and BPH presened with leg pain with weeping again, requiring hospn despite recent improvement with cutting back on steroid.  Also with cough of uncertain significance, and no f/c.  Has hx of AS, and now with dx CHF exacerbation, echo with FE 55%, grade 1 diast dysfxn.  Did well with IV diuresis about negative 4L, and home dose lasix increased to 40 mg and instruced on low salt, daily wt. No fever, wheezing and Pt denies chest pain, increased sob or doe, orthopnea, PND, increased LE swelling, palpitations, dizziness or syncope.  No other new complaints  Prednisone now down to 10 mg daily per rheum Past Medical History:  Diagnosis Date  . ALLERGIC RHINITIS 09/29/2007  . Aortic stenosis 02/05/2017  . Arthritis    "was in his knees" (01/19/2018)  . BACK PAIN 01/31/2009  . BENIGN PROSTATIC HYPERTROPHY 05/12/2007  . CHEST PAIN 01/31/2008  . CHRONIC OBSTRUCTIVE PULMONARY DISEASE, ACUTE EXACERBATION 08/17/2007  . CONJUNCTIVITIS, ALLERGIC, CHRONIC 03/24/2010  . CONSTIPATION 02/19/2010  . COPD 05/12/2007  . CVA (cerebral vascular accident) ([place]) 05/03/2011  . GERD 02/23/2010  . GOUT 07/04/2010  . Heart murmur   . History of kidney stones    "passed it"  . History of stomach ulcers   . HYPERLIPIDEMIA 05/12/2007  . HYPERTENSION 05/12/2007  . NEPHROLITHIASIS, HX OF   . OBESITY 05/12/2007  . PEPTIC ULCER DISEASE 05/12/2007  . PERIPHERAL VASCULAR DISEASE 05/12/2007  . Personal history of unspecified circulatory disease 05/12/2007  . PLANTAR FASCIITIS, RIGHT 09/27/2008  . RENAL CELL CANCER 05/12/2007  . RENAL INSUFFICIENCY 08/17/2007  . SKIN LESION 11/07/2010  . VERTIGO 09/29/2007  . WRIST PAIN, RIGHT 02/26/2009   Past Surgical History:  Procedure Laterality Date  . ARTERY BIOPSY Bilateral 10/27/2017     Procedure: BILATERAL TEMPORAL ARTERY BIOPSY;  Surgeon: Serafina Mitchell, MD;  Location: [facility] OR;  Service: Vascular;  Laterality: Bilateral;  . CATARACT EXTRACTION W/ INTRAOCULAR LENS  IMPLANT, BILATERAL Bilateral   . FEMORAL-POPLITEAL BYPASS GRAFT Bilateral   . INNER EAR SURGERY Bilateral    "had it twice in one of his ears; once in the other"  . JOINT REPLACEMENT    . NEPHRECTOMY    . NEPHROURETERECTOMY    . TOTAL KNEE ARTHROPLASTY Left 06/2007  . TOTAL KNEE ARTHROPLASTY Right     reports that he has been smoking cigarettes.  He has a 40.00 pack-year smoking history. He has never used smokeless tobacco. He reports that he does not drink alcohol or use drugs. family history includes Alcohol abuse in his brother; Cancer in his daughter, mother, other, and other; Heart disease in his brother. Allergies  Allergen Reactions  . Sulfonamide Derivatives Rash   Current Outpatient Medications on File Prior to Visit  Medication Sig Dispense Refill  . allopurinol (ZYLOPRIM) 100 MG tablet TAKE 1 TABLET EVERY DAY 90 tablet 3  . amLODipine (NORVASC) 5 MG tablet TAKE 1 TABLET (5 MG TOTAL) BY MOUTH DAILY. 90 tablet 3  . aspirin EC 81 MG tablet Take 1 tablet (81 mg total) by mouth daily. 90 tablet 3  . cetirizine (ZYRTEC) 10 MG tablet Take 10 mg by mouth daily as needed for allergies.     Marland Kitchen docusate sodium (COLACE) 100 MG capsule Take 300  mg by mouth daily as needed for mild constipation.    . furosemide (LASIX) 20 MG tablet Take 2 tablets (40 mg total) by mouth daily. 30 tablet 0  . guaiFENesin (MUCINEX) 600 MG 12 hr tablet Take 600 mg by mouth 2 (two) times daily as needed for cough or to loosen phlegm.    . mirtazapine (REMERON) 15 MG tablet Take 1 tablet (15 mg total) by mouth at bedtime. 90 tablet 3  . polyethylene glycol (MIRALAX / GLYCOLAX) packet Take 17 g by mouth daily as needed for mild constipation.    . pravastatin (PRAVACHOL) 40 MG tablet TAKE 1 TABLET EVERY EVENING (Patient taking  differently: Take 40 mg by mouth in the evening) 90 tablet 3  . predniSONE (DELTASONE) 5 MG tablet 10 mg in the morning and 2.5mg  at night.  0  . omeprazole (PRILOSEC) 40 MG capsule Take 1 capsule (40 mg total) by mouth daily. 30 capsule 2   No current facility-administered medications on file prior to visit.    Review of Systems  Constitutional: Negative for other unusual diaphoresis or sweats HENT: Negative for ear discharge or swelling Eyes: Negative for other worsening visual disturbances Respiratory: Negative for stridor or other swelling  Gastrointestinal: Negative for worsening distension or other blood Genitourinary: Negative for retention or other urinary change Musculoskeletal: Negative for other MSK pain or swelling Skin: Negative for color change or other new lesions Neurological: Negative for worsening tremors and other numbness  Psychiatric/Behavioral: Negative for worsening agitation or other fatigue All other system neg per pt    Objective:   Physical Exam BP 124/80   Pulse (!) 55   Temp 97.9 F (36.6 C) (Oral)   Ht 5\' 5"  (1.651 m)   Wt 155 lb (70.3 kg)   SpO2 94%   BMI 25.79 kg/m  VS noted,  Constitutional: Pt appears in NAD HENT: Head: NCAT.  Right Ear: External ear normal.  Left Ear: External ear normal.  Eyes: . Pupils are equal, round, and reactive to light. Conjunctivae and EOM are normal Nose: without d/c or deformity Neck: Neck supple. Gross normal ROM Cardiovascular: Normal rate and regular rhythm.   Pulmonary/Chest: Effort normal and breath sounds without rales or wheezing.  Abd:  Soft, NT, ND, + BS, no organomegaly Neurological: Pt is alert. At baseline orientation, motor grossly intact Skin: Skin is warm. No rashes, other new lesions, trace to 1+ right > left ankle edema Psychiatric: Pt behavior is normal without agitation  No other exam findings    Assessment & Plan:

## 2018-01-31 NOTE — Assessment & Plan Note (Signed)
Cont prednisone wean, stable

## 2018-01-31 NOTE — Assessment & Plan Note (Signed)
Assoc with generalized volume overload recent, echo reviewed, wt stable post hospn at 150-155 with good attention to detail and support with wife and son; cont same tx, has f/u card soon, also for BMP today

## 2018-01-31 NOTE — Telephone Encounter (Signed)
Copied from [place] [phone]. Topic: Quick Communication - See Telephone Encounter >> Jan 31, 2018  4:03 PM Boyd Kerbs wrote: CRM for notification. See Telephone encounter for: 01/31/18.  Tharon Aquas [facility] [phone] needing verbal orders for PT  1 x 1 ; 2 x 4  strengthening, balance retraining and ambulation.

## 2018-02-01 ENCOUNTER — Other Ambulatory Visit: Payer: Self-pay | Admitting: Internal Medicine

## 2018-02-01 DIAGNOSIS — J449 Chronic obstructive pulmonary disease, unspecified: Secondary | ICD-10-CM | POA: Diagnosis not present

## 2018-02-01 DIAGNOSIS — N184 Chronic kidney disease, stage 4 (severe): Secondary | ICD-10-CM | POA: Diagnosis not present

## 2018-02-01 DIAGNOSIS — L89322 Pressure ulcer of left buttock, stage 2: Secondary | ICD-10-CM | POA: Diagnosis not present

## 2018-02-01 DIAGNOSIS — I5033 Acute on chronic diastolic (congestive) heart failure: Secondary | ICD-10-CM | POA: Diagnosis not present

## 2018-02-01 DIAGNOSIS — H543 Unqualified visual loss, both eyes: Secondary | ICD-10-CM | POA: Diagnosis not present

## 2018-02-01 DIAGNOSIS — I130 Hypertensive heart and chronic kidney disease with heart failure and stage 1 through stage 4 chronic kidney disease, or unspecified chronic kidney disease: Secondary | ICD-10-CM | POA: Diagnosis not present

## 2018-02-01 DIAGNOSIS — M316 Other giant cell arteritis: Secondary | ICD-10-CM | POA: Diagnosis not present

## 2018-02-01 DIAGNOSIS — K219 Gastro-esophageal reflux disease without esophagitis: Secondary | ICD-10-CM | POA: Diagnosis not present

## 2018-02-01 DIAGNOSIS — D631 Anemia in chronic kidney disease: Secondary | ICD-10-CM | POA: Diagnosis not present

## 2018-02-01 NOTE — Telephone Encounter (Signed)
Called [name] no answer LMOM w/MD response../lmb 

## 2018-02-01 NOTE — Telephone Encounter (Signed)
Ok for verbals 

## 2018-02-02 DIAGNOSIS — I5033 Acute on chronic diastolic (congestive) heart failure: Secondary | ICD-10-CM | POA: Diagnosis not present

## 2018-02-02 DIAGNOSIS — D631 Anemia in chronic kidney disease: Secondary | ICD-10-CM | POA: Diagnosis not present

## 2018-02-02 DIAGNOSIS — L89322 Pressure ulcer of left buttock, stage 2: Secondary | ICD-10-CM | POA: Diagnosis not present

## 2018-02-02 DIAGNOSIS — J449 Chronic obstructive pulmonary disease, unspecified: Secondary | ICD-10-CM | POA: Diagnosis not present

## 2018-02-02 DIAGNOSIS — N184 Chronic kidney disease, stage 4 (severe): Secondary | ICD-10-CM | POA: Diagnosis not present

## 2018-02-02 DIAGNOSIS — K219 Gastro-esophageal reflux disease without esophagitis: Secondary | ICD-10-CM | POA: Diagnosis not present

## 2018-02-02 DIAGNOSIS — M316 Other giant cell arteritis: Secondary | ICD-10-CM | POA: Diagnosis not present

## 2018-02-02 DIAGNOSIS — H543 Unqualified visual loss, both eyes: Secondary | ICD-10-CM | POA: Diagnosis not present

## 2018-02-02 DIAGNOSIS — I130 Hypertensive heart and chronic kidney disease with heart failure and stage 1 through stage 4 chronic kidney disease, or unspecified chronic kidney disease: Secondary | ICD-10-CM | POA: Diagnosis not present

## 2018-02-03 ENCOUNTER — Encounter: Payer: Self-pay | Admitting: Cardiology

## 2018-02-03 ENCOUNTER — Ambulatory Visit: Payer: Medicare HMO | Admitting: Cardiology

## 2018-02-03 VITALS — BP 132/68 | HR 66 | Ht 65.0 in | Wt 156.2 lb

## 2018-02-03 DIAGNOSIS — I5032 Chronic diastolic (congestive) heart failure: Secondary | ICD-10-CM | POA: Diagnosis not present

## 2018-02-03 DIAGNOSIS — I10 Essential (primary) hypertension: Secondary | ICD-10-CM | POA: Diagnosis not present

## 2018-02-03 DIAGNOSIS — I481 Persistent atrial fibrillation: Secondary | ICD-10-CM | POA: Diagnosis not present

## 2018-02-03 DIAGNOSIS — I4819 Other persistent atrial fibrillation: Secondary | ICD-10-CM

## 2018-02-03 DIAGNOSIS — I350 Nonrheumatic aortic (valve) stenosis: Secondary | ICD-10-CM

## 2018-02-03 MED ORDER — APIXABAN 2.5 MG PO TABS: 2.5000 mg | ORAL_TABLET | Freq: Two times a day (BID) | ORAL | 3 refills | Status: DC

## 2018-02-03 MED ORDER — APIXABAN 2.5 MG PO TABS: 2.5000 mg | ORAL_TABLET | Freq: Two times a day (BID) | ORAL | 0 refills | Status: DC

## 2018-02-03 NOTE — Telephone Encounter (Signed)
Caryl Pina w/[facility] [phone] ex [phone] needs the order to be returned for the walker.  Fax: [phone].

## 2018-02-03 NOTE — Progress Notes (Signed)
Electrophysiology Office Note   Date:  02/03/2018   ID:  [name], DOB 02-01-1925, MRN [id number]  PCP:  [name], MD Primary Electrophysiologist:  [name], MD    No chief complaint on file.    History of Present Illness: [name] is a 84 y.o. male who is being seen today for the evaluation of heart murmur at the request of [name], MD. Presenting today for electrophysiology evaluation. He is found to have a murmur by his primary physician who ordered an echocardiogram which showed moderate aortic stenosis as well as mild mitral regurgitation.  Today, denies symptoms of palpitations, chest pain, shortness of breath, orthopnea, PND, lower extremity edema, claudication, dizziness, presyncope, syncope, bleeding, or neurologic sequela. The patient is tolerating medications without difficulties.  He recently hospitalized for diastolic heart failure.  He was diuresed 4 L and discharged.  He has been undergoing work with home health to get stronger.  Of note the patient is a World War II veteran.  Past Medical History:  Diagnosis Date  . ALLERGIC RHINITIS 09/29/2007  . Aortic stenosis 02/05/2017  . Arthritis    "was in his knees" (01/19/2018)  . BACK PAIN 01/31/2009  . BENIGN PROSTATIC HYPERTROPHY 05/12/2007  . CHEST PAIN 01/31/2008  . CHRONIC OBSTRUCTIVE PULMONARY DISEASE, ACUTE EXACERBATION 08/17/2007  . CONJUNCTIVITIS, ALLERGIC, CHRONIC 03/24/2010  . CONSTIPATION 02/19/2010  . COPD 05/12/2007  . CVA (cerebral vascular accident) ([place]) 05/03/2011  . GERD 02/23/2010  . GOUT 07/04/2010  . Heart murmur   . History of kidney stones    "passed it"  . History of stomach ulcers   . HYPERLIPIDEMIA 05/12/2007  . HYPERTENSION 05/12/2007  . NEPHROLITHIASIS, HX OF   . OBESITY 05/12/2007  . PEPTIC ULCER DISEASE 05/12/2007  . PERIPHERAL VASCULAR DISEASE 05/12/2007  . Personal history of unspecified circulatory disease 05/12/2007  . PLANTAR FASCIITIS, RIGHT 09/27/2008  .  RENAL CELL CANCER 05/12/2007  . RENAL INSUFFICIENCY 08/17/2007  . SKIN LESION 11/07/2010  . VERTIGO 09/29/2007  . WRIST PAIN, RIGHT 02/26/2009   Past Surgical History:  Procedure Laterality Date  . ARTERY BIOPSY Bilateral 10/27/2017   Procedure: BILATERAL TEMPORAL ARTERY BIOPSY;  Surgeon: Serafina Mitchell, MD;  Location: [facility] OR;  Service: Vascular;  Laterality: Bilateral;  . CATARACT EXTRACTION W/ INTRAOCULAR LENS  IMPLANT, BILATERAL Bilateral   . FEMORAL-POPLITEAL BYPASS GRAFT Bilateral   . INNER EAR SURGERY Bilateral    "had it twice in one of his ears; once in the other"  . JOINT REPLACEMENT    . NEPHRECTOMY    . NEPHROURETERECTOMY    . TOTAL KNEE ARTHROPLASTY Left 06/2007  . TOTAL KNEE ARTHROPLASTY Right      Current Outpatient Medications  Medication Sig Dispense Refill  . allopurinol (ZYLOPRIM) 100 MG tablet TAKE 1 TABLET EVERY DAY 90 tablet 3  . amLODipine (NORVASC) 5 MG tablet TAKE 1 TABLET (5 MG TOTAL) BY MOUTH DAILY. 90 tablet 3  . aspirin EC 81 MG tablet Take 1 tablet (81 mg total) by mouth daily. 90 tablet 3  . cetirizine (ZYRTEC) 10 MG tablet Take 10 mg by mouth daily as needed for allergies.     Marland Kitchen docusate sodium (COLACE) 100 MG capsule Take 300 mg by mouth daily as needed for mild constipation.    . furosemide (LASIX) 20 MG tablet Take 2 tablets (40 mg total) by mouth daily. 30 tablet 0  . guaiFENesin (MUCINEX) 600 MG 12 hr tablet Take 600 mg  by mouth 2 (two) times daily as needed for cough or to loosen phlegm.    . meclizine (ANTIVERT) 12.5 MG tablet TAKE 1 TABLET FOUR TIMES DAILY AS NEEDED 180 tablet 0  . mirtazapine (REMERON) 15 MG tablet Take 1 tablet (15 mg total) by mouth at bedtime. 90 tablet 3  . polyethylene glycol (MIRALAX / GLYCOLAX) packet Take 17 g by mouth daily as needed for mild constipation.    . pravastatin (PRAVACHOL) 40 MG tablet Take 40 mg by mouth as directed.    . predniSONE (DELTASONE) 5 MG tablet 10 mg in the morning and 2.5mg  at night.  0  .  omeprazole (PRILOSEC) 40 MG capsule Take 1 capsule (40 mg total) by mouth daily. 30 capsule 2   No current facility-administered medications for this visit.     Allergies:   Sulfonamide derivatives   Social History:  The patient  reports that he has been smoking cigarettes.  He has a 40.00 pack-year smoking history. He has never used smokeless tobacco. He reports that he does not drink alcohol or use drugs.   Family History:  The patient's family history includes Alcohol abuse in his brother; Cancer in his daughter, mother, other, and other; Heart disease in his brother.   ROS:  Please see the history of present illness.   Otherwise, review of systems is positive for neck swelling, leg pain, facial swelling, hearing loss, visual change, cough, depression, easy bruising.   All other systems are reviewed and negative.   PHYSICAL EXAM: VS:  BP 132/68   Pulse 66   Ht 5\' 5"  (1.651 m)   Wt 156 lb 4 oz (70.9 kg)   SpO2 94%   BMI 26.00 kg/m  , BMI Body mass index is 26 kg/m. GEN: Well nourished, well developed, in no acute distress  HEENT: normal  Neck: no JVD, carotid bruits, or masses Cardiac: RRR; no murmurs, rubs, or gallops, 2+ edema  Respiratory:  clear to auscultation bilaterally, normal work of breathing GI: soft, nontender, nondistended, + BS MS: no deformity or atrophy  Skin: warm and dry Neuro:  Strength and sensation are intact Psych: euthymic mood, full affect  EKG:  EKG is not ordered today. Personal review of the ekg ordered 01/20/18 shows atrial fibrillation, rate 87, right bundle branch block   Recent Labs: 11/27/2017: TSH 1.045 01/19/2018: ALT 27; B Natriuretic Peptide 226.0 01/20/2018: Hemoglobin 11.3; Platelets 181 01/31/2018: BUN 56; Creatinine, Ser 1.93; Potassium 4.6; Sodium 137    Lipid Panel     Component Value Date/Time   CHOL 101 10/26/2017 0217   TRIG 103 10/26/2017 0217   HDL 37 (L) 10/26/2017 0217   CHOLHDL 2.7 10/26/2017 0217   VLDL 21 10/26/2017  0217   LDLCALC 43 10/26/2017 0217     Wt Readings from Last 3 Encounters:  02/03/18 156 lb 4 oz (70.9 kg)  01/31/18 155 lb (70.3 kg)  01/23/18 159 lb (72.1 kg)      Other studies Reviewed: Additional studies/ records that were reviewed today include: TTE 5723/19 Review of the above records today demonstrates:  - Left ventricle: The cavity size was mildly dilated. Systolic   function was normal. The estimated ejection fraction was in the   range of 50% to 55%. Wall motion was normal; there were no   regional wall motion abnormalities. Doppler parameters are   consistent with abnormal left ventricular relaxation (grade 1   diastolic dysfunction). - Aortic valve: Valve mobility was restricted. There was moderate  stenosis. There was mild to moderate regurgitation. Mean gradient   (S): 22 mm Hg. Valve area (VTI): 1.15 cm^2. Valve area (Vmax):   1.08 cm^2. Valve area (Vmean): 1.12 cm^2. Regurgitation pressure   half-time: 572 ms. - Mitral valve: Mild thickening and calcification, with moderate   involvement of chords. There was mild regurgitation. - Left atrium: The atrium was mildly to moderately dilated. - Pulmonic valve: There was trivial regurgitation. - Pulmonary arteries: PA peak pressure: 37 mm Hg (S).   ASSESSMENT AND PLAN:  1.  Moderate aortic stenosis: Most recent echo shows moderate aortic stenosis.  Unlikely the cause of his heart failure.  No indications for valve replacement at this time.  2. Mild Mitral regurgitation: Mild on most recent echo.  No changes.  3. Hypertension: Well-controlled.  No changes.  4. Tobacco abuse: No interest in quitting at this time.  5.  Diastolic heart failure: Recent hospitalization with 4 L of diuresis in the hospital.  His lower extremities are still swollen.  We Onix Jumper increase his Lasix to 40 mg twice a day for the next 3 days.  6.  Persistent atrial fibrillation: Not currently anticoagulated.  Danny Zimny start Eliquis today.  He is  currently rate controlled.  We Maika Kaczmarek see him back in a month for discussion of cardioversion to see if he Myisha Pickerel stay in rhythm which may help his volume status.  This patients CHA2DS2-VASc Score and unadjusted Ischemic Stroke Rate (% per year) is equal to 3.2 % stroke rate/year from a score of 3  Above score calculated as 1 point each if present [CHF, HTN, DM, Vascular=MI/PAD/Aortic Plaque, Age if 65-74, or Male] Above score calculated as 2 points each if present [Age > 75, or Stroke/TIA/TE]  Current medicines are reviewed at length with the patient today.   The patient does not have concerns regarding his medicines.  The following changes were made today: Increase Lasix for 3 days, start Eliquis  Labs/ tests ordered today include:  No orders of the defined types were placed in this encounter.    Disposition:   FU with Malillany Kazlauskas 3 months  Signed, Derk Doubek Meredith Leeds, MD  02/03/2018 2:02 PM     [facility] [address] [place] [address] [phone] (office) [phone] (fax)

## 2018-02-03 NOTE — Telephone Encounter (Signed)
If you go down to the bottom, it is for verbal orders.

## 2018-02-03 NOTE — Patient Instructions (Addendum)
Medication Instructions:  Your physician has recommended you make the following change in your medication:  1. INCREASE Lasix to 40 mg twice daily for 3 days, then return to normal daily dosing of 40 mg once daily 2. START Eliquis 2.5 mg twice daily 3. STOP Aspirin  Labwork: None ordered     *We will only notify you of abnormal results, otherwise continue current treatment plan.  Testing/Procedures: None ordered  Follow-Up: Your physician recommends that you schedule a follow-up appointment in: 1 month with an extender  Your physician recommends that you schedule a follow-up appointment in: 3 months with Dr. Curt Bears.   * If you need a refill on your cardiac medications before your next appointment, please call your pharmacy.   *Please note that any paperwork needing to be filled out by the provider will need to be addressed at the front desk prior to seeing the provider. Please note that any FMLA, disability or other documents regarding health condition is subject to a $25.00 charge that must be received prior to completion of paperwork in the form of a money order or check.  Thank you for choosing [facility]!!   Trinidad Curet, RN [phone]  Any Other Special Instructions Will Be Listed Below (If Applicable).

## 2018-02-04 DIAGNOSIS — I5033 Acute on chronic diastolic (congestive) heart failure: Secondary | ICD-10-CM

## 2018-02-04 DIAGNOSIS — N400 Enlarged prostate without lower urinary tract symptoms: Secondary | ICD-10-CM

## 2018-02-04 DIAGNOSIS — D631 Anemia in chronic kidney disease: Secondary | ICD-10-CM

## 2018-02-04 DIAGNOSIS — J449 Chronic obstructive pulmonary disease, unspecified: Secondary | ICD-10-CM

## 2018-02-04 DIAGNOSIS — Z8673 Personal history of transient ischemic attack (TIA), and cerebral infarction without residual deficits: Secondary | ICD-10-CM

## 2018-02-04 DIAGNOSIS — E785 Hyperlipidemia, unspecified: Secondary | ICD-10-CM

## 2018-02-04 DIAGNOSIS — Z7982 Long term (current) use of aspirin: Secondary | ICD-10-CM

## 2018-02-04 DIAGNOSIS — L89322 Pressure ulcer of left buttock, stage 2: Secondary | ICD-10-CM

## 2018-02-04 DIAGNOSIS — H543 Unqualified visual loss, both eyes: Secondary | ICD-10-CM

## 2018-02-04 DIAGNOSIS — M109 Gout, unspecified: Secondary | ICD-10-CM

## 2018-02-04 DIAGNOSIS — M316 Other giant cell arteritis: Secondary | ICD-10-CM

## 2018-02-04 DIAGNOSIS — F17210 Nicotine dependence, cigarettes, uncomplicated: Secondary | ICD-10-CM

## 2018-02-04 DIAGNOSIS — E669 Obesity, unspecified: Secondary | ICD-10-CM

## 2018-02-04 DIAGNOSIS — K219 Gastro-esophageal reflux disease without esophagitis: Secondary | ICD-10-CM

## 2018-02-04 DIAGNOSIS — I130 Hypertensive heart and chronic kidney disease with heart failure and stage 1 through stage 4 chronic kidney disease, or unspecified chronic kidney disease: Secondary | ICD-10-CM

## 2018-02-04 DIAGNOSIS — N184 Chronic kidney disease, stage 4 (severe): Secondary | ICD-10-CM

## 2018-02-07 ENCOUNTER — Telehealth: Payer: Self-pay | Admitting: Cardiology

## 2018-02-07 DIAGNOSIS — J449 Chronic obstructive pulmonary disease, unspecified: Secondary | ICD-10-CM | POA: Diagnosis not present

## 2018-02-07 DIAGNOSIS — L89322 Pressure ulcer of left buttock, stage 2: Secondary | ICD-10-CM | POA: Diagnosis not present

## 2018-02-07 DIAGNOSIS — M316 Other giant cell arteritis: Secondary | ICD-10-CM | POA: Diagnosis not present

## 2018-02-07 DIAGNOSIS — H543 Unqualified visual loss, both eyes: Secondary | ICD-10-CM | POA: Diagnosis not present

## 2018-02-07 DIAGNOSIS — I130 Hypertensive heart and chronic kidney disease with heart failure and stage 1 through stage 4 chronic kidney disease, or unspecified chronic kidney disease: Secondary | ICD-10-CM | POA: Diagnosis not present

## 2018-02-07 DIAGNOSIS — I5033 Acute on chronic diastolic (congestive) heart failure: Secondary | ICD-10-CM | POA: Diagnosis not present

## 2018-02-07 DIAGNOSIS — D631 Anemia in chronic kidney disease: Secondary | ICD-10-CM | POA: Diagnosis not present

## 2018-02-07 DIAGNOSIS — K219 Gastro-esophageal reflux disease without esophagitis: Secondary | ICD-10-CM | POA: Diagnosis not present

## 2018-02-07 DIAGNOSIS — N184 Chronic kidney disease, stage 4 (severe): Secondary | ICD-10-CM | POA: Diagnosis not present

## 2018-02-07 NOTE — Telephone Encounter (Signed)
Pt's wife calling and need to speak to nurse to give update on pt's condition. Please call

## 2018-02-07 NOTE — Telephone Encounter (Signed)
Wife tells me that patient isn't urinating well.  (pt w/ hx CKD III) Advised wife to contact kidney doctor to discuss further and to schedule an evaluation. Wife is in agreeance to plan and is waiting for urology office to call back to arrange an appt. She appreciates my return call.

## 2018-02-09 DIAGNOSIS — M316 Other giant cell arteritis: Secondary | ICD-10-CM | POA: Diagnosis not present

## 2018-02-09 DIAGNOSIS — D631 Anemia in chronic kidney disease: Secondary | ICD-10-CM | POA: Diagnosis not present

## 2018-02-09 DIAGNOSIS — L89322 Pressure ulcer of left buttock, stage 2: Secondary | ICD-10-CM | POA: Diagnosis not present

## 2018-02-09 DIAGNOSIS — I5033 Acute on chronic diastolic (congestive) heart failure: Secondary | ICD-10-CM | POA: Diagnosis not present

## 2018-02-09 DIAGNOSIS — J449 Chronic obstructive pulmonary disease, unspecified: Secondary | ICD-10-CM | POA: Diagnosis not present

## 2018-02-09 DIAGNOSIS — I130 Hypertensive heart and chronic kidney disease with heart failure and stage 1 through stage 4 chronic kidney disease, or unspecified chronic kidney disease: Secondary | ICD-10-CM | POA: Diagnosis not present

## 2018-02-09 DIAGNOSIS — K219 Gastro-esophageal reflux disease without esophagitis: Secondary | ICD-10-CM | POA: Diagnosis not present

## 2018-02-09 DIAGNOSIS — H543 Unqualified visual loss, both eyes: Secondary | ICD-10-CM | POA: Diagnosis not present

## 2018-02-09 DIAGNOSIS — N184 Chronic kidney disease, stage 4 (severe): Secondary | ICD-10-CM | POA: Diagnosis not present

## 2018-02-10 DIAGNOSIS — R0602 Shortness of breath: Secondary | ICD-10-CM | POA: Diagnosis not present

## 2018-02-10 DIAGNOSIS — D631 Anemia in chronic kidney disease: Secondary | ICD-10-CM | POA: Diagnosis not present

## 2018-02-10 DIAGNOSIS — N183 Chronic kidney disease, stage 3 (moderate): Secondary | ICD-10-CM | POA: Diagnosis not present

## 2018-02-10 DIAGNOSIS — N184 Chronic kidney disease, stage 4 (severe): Secondary | ICD-10-CM | POA: Diagnosis not present

## 2018-02-10 DIAGNOSIS — E663 Overweight: Secondary | ICD-10-CM | POA: Diagnosis not present

## 2018-02-10 DIAGNOSIS — T887XXA Unspecified adverse effect of drug or medicament, initial encounter: Secondary | ICD-10-CM | POA: Diagnosis not present

## 2018-02-10 DIAGNOSIS — R600 Localized edema: Secondary | ICD-10-CM | POA: Diagnosis not present

## 2018-02-10 DIAGNOSIS — R05 Cough: Secondary | ICD-10-CM | POA: Diagnosis not present

## 2018-02-10 DIAGNOSIS — I5033 Acute on chronic diastolic (congestive) heart failure: Secondary | ICD-10-CM | POA: Diagnosis not present

## 2018-02-10 DIAGNOSIS — K219 Gastro-esophageal reflux disease without esophagitis: Secondary | ICD-10-CM | POA: Diagnosis not present

## 2018-02-10 DIAGNOSIS — H543 Unqualified visual loss, both eyes: Secondary | ICD-10-CM | POA: Diagnosis not present

## 2018-02-10 DIAGNOSIS — J449 Chronic obstructive pulmonary disease, unspecified: Secondary | ICD-10-CM | POA: Diagnosis not present

## 2018-02-10 DIAGNOSIS — I130 Hypertensive heart and chronic kidney disease with heart failure and stage 1 through stage 4 chronic kidney disease, or unspecified chronic kidney disease: Secondary | ICD-10-CM | POA: Diagnosis not present

## 2018-02-10 DIAGNOSIS — L89322 Pressure ulcer of left buttock, stage 2: Secondary | ICD-10-CM | POA: Diagnosis not present

## 2018-02-10 DIAGNOSIS — Z6826 Body mass index (BMI) 26.0-26.9, adult: Secondary | ICD-10-CM | POA: Diagnosis not present

## 2018-02-10 DIAGNOSIS — M316 Other giant cell arteritis: Secondary | ICD-10-CM | POA: Diagnosis not present

## 2018-02-14 DIAGNOSIS — M316 Other giant cell arteritis: Secondary | ICD-10-CM | POA: Diagnosis not present

## 2018-02-14 DIAGNOSIS — L89322 Pressure ulcer of left buttock, stage 2: Secondary | ICD-10-CM | POA: Diagnosis not present

## 2018-02-14 DIAGNOSIS — I130 Hypertensive heart and chronic kidney disease with heart failure and stage 1 through stage 4 chronic kidney disease, or unspecified chronic kidney disease: Secondary | ICD-10-CM | POA: Diagnosis not present

## 2018-02-14 DIAGNOSIS — D631 Anemia in chronic kidney disease: Secondary | ICD-10-CM | POA: Diagnosis not present

## 2018-02-14 DIAGNOSIS — K219 Gastro-esophageal reflux disease without esophagitis: Secondary | ICD-10-CM | POA: Diagnosis not present

## 2018-02-14 DIAGNOSIS — I5033 Acute on chronic diastolic (congestive) heart failure: Secondary | ICD-10-CM | POA: Diagnosis not present

## 2018-02-14 DIAGNOSIS — H543 Unqualified visual loss, both eyes: Secondary | ICD-10-CM | POA: Diagnosis not present

## 2018-02-14 DIAGNOSIS — N184 Chronic kidney disease, stage 4 (severe): Secondary | ICD-10-CM | POA: Diagnosis not present

## 2018-02-14 DIAGNOSIS — J449 Chronic obstructive pulmonary disease, unspecified: Secondary | ICD-10-CM | POA: Diagnosis not present

## 2018-02-16 ENCOUNTER — Telehealth: Payer: Self-pay

## 2018-02-16 DIAGNOSIS — I130 Hypertensive heart and chronic kidney disease with heart failure and stage 1 through stage 4 chronic kidney disease, or unspecified chronic kidney disease: Secondary | ICD-10-CM | POA: Diagnosis not present

## 2018-02-16 DIAGNOSIS — K219 Gastro-esophageal reflux disease without esophagitis: Secondary | ICD-10-CM | POA: Diagnosis not present

## 2018-02-16 DIAGNOSIS — M316 Other giant cell arteritis: Secondary | ICD-10-CM | POA: Diagnosis not present

## 2018-02-16 DIAGNOSIS — D631 Anemia in chronic kidney disease: Secondary | ICD-10-CM | POA: Diagnosis not present

## 2018-02-16 DIAGNOSIS — H543 Unqualified visual loss, both eyes: Secondary | ICD-10-CM | POA: Diagnosis not present

## 2018-02-16 DIAGNOSIS — J449 Chronic obstructive pulmonary disease, unspecified: Secondary | ICD-10-CM | POA: Diagnosis not present

## 2018-02-16 DIAGNOSIS — N184 Chronic kidney disease, stage 4 (severe): Secondary | ICD-10-CM | POA: Diagnosis not present

## 2018-02-16 DIAGNOSIS — I5033 Acute on chronic diastolic (congestive) heart failure: Secondary | ICD-10-CM | POA: Diagnosis not present

## 2018-02-16 DIAGNOSIS — L89322 Pressure ulcer of left buttock, stage 2: Secondary | ICD-10-CM | POA: Diagnosis not present

## 2018-02-16 NOTE — Telephone Encounter (Signed)
There is about zero chance I would recall this.  I have nothing further to offer

## 2018-02-16 NOTE — Telephone Encounter (Signed)
Copied from [place] [id number]. Topic: General - Other >> Feb 16, 2018 11:01 AM Valla Leaver wrote: Reason for CRM: Patient's wife calling because she says [facility] sent forms for Dr. Jenny Reichmann to fill out to get a rollator. She says they sent it to the office several times, however, they have not heard back. Please call her to confirm whether or not the request was received by Dr. Jenny Reichmann. Thank you.

## 2018-02-17 DIAGNOSIS — R0602 Shortness of breath: Secondary | ICD-10-CM | POA: Diagnosis not present

## 2018-02-17 DIAGNOSIS — J449 Chronic obstructive pulmonary disease, unspecified: Secondary | ICD-10-CM | POA: Diagnosis not present

## 2018-02-21 DIAGNOSIS — L89322 Pressure ulcer of left buttock, stage 2: Secondary | ICD-10-CM | POA: Diagnosis not present

## 2018-02-21 DIAGNOSIS — D631 Anemia in chronic kidney disease: Secondary | ICD-10-CM | POA: Diagnosis not present

## 2018-02-21 DIAGNOSIS — N184 Chronic kidney disease, stage 4 (severe): Secondary | ICD-10-CM | POA: Diagnosis not present

## 2018-02-21 DIAGNOSIS — J449 Chronic obstructive pulmonary disease, unspecified: Secondary | ICD-10-CM | POA: Diagnosis not present

## 2018-02-21 DIAGNOSIS — I130 Hypertensive heart and chronic kidney disease with heart failure and stage 1 through stage 4 chronic kidney disease, or unspecified chronic kidney disease: Secondary | ICD-10-CM | POA: Diagnosis not present

## 2018-02-21 DIAGNOSIS — I5033 Acute on chronic diastolic (congestive) heart failure: Secondary | ICD-10-CM | POA: Diagnosis not present

## 2018-02-21 DIAGNOSIS — K219 Gastro-esophageal reflux disease without esophagitis: Secondary | ICD-10-CM | POA: Diagnosis not present

## 2018-02-21 DIAGNOSIS — H543 Unqualified visual loss, both eyes: Secondary | ICD-10-CM | POA: Diagnosis not present

## 2018-02-21 DIAGNOSIS — M316 Other giant cell arteritis: Secondary | ICD-10-CM | POA: Diagnosis not present

## 2018-02-23 DIAGNOSIS — L89322 Pressure ulcer of left buttock, stage 2: Secondary | ICD-10-CM | POA: Diagnosis not present

## 2018-02-23 DIAGNOSIS — K219 Gastro-esophageal reflux disease without esophagitis: Secondary | ICD-10-CM | POA: Diagnosis not present

## 2018-02-23 DIAGNOSIS — M316 Other giant cell arteritis: Secondary | ICD-10-CM | POA: Diagnosis not present

## 2018-02-23 DIAGNOSIS — D631 Anemia in chronic kidney disease: Secondary | ICD-10-CM | POA: Diagnosis not present

## 2018-02-23 DIAGNOSIS — I130 Hypertensive heart and chronic kidney disease with heart failure and stage 1 through stage 4 chronic kidney disease, or unspecified chronic kidney disease: Secondary | ICD-10-CM | POA: Diagnosis not present

## 2018-02-23 DIAGNOSIS — I5033 Acute on chronic diastolic (congestive) heart failure: Secondary | ICD-10-CM | POA: Diagnosis not present

## 2018-02-23 DIAGNOSIS — H543 Unqualified visual loss, both eyes: Secondary | ICD-10-CM | POA: Diagnosis not present

## 2018-02-23 DIAGNOSIS — J449 Chronic obstructive pulmonary disease, unspecified: Secondary | ICD-10-CM | POA: Diagnosis not present

## 2018-02-23 DIAGNOSIS — N184 Chronic kidney disease, stage 4 (severe): Secondary | ICD-10-CM | POA: Diagnosis not present

## 2018-02-24 DIAGNOSIS — N184 Chronic kidney disease, stage 4 (severe): Secondary | ICD-10-CM | POA: Diagnosis not present

## 2018-02-24 DIAGNOSIS — I130 Hypertensive heart and chronic kidney disease with heart failure and stage 1 through stage 4 chronic kidney disease, or unspecified chronic kidney disease: Secondary | ICD-10-CM | POA: Diagnosis not present

## 2018-02-24 DIAGNOSIS — H543 Unqualified visual loss, both eyes: Secondary | ICD-10-CM | POA: Diagnosis not present

## 2018-02-24 DIAGNOSIS — D631 Anemia in chronic kidney disease: Secondary | ICD-10-CM | POA: Diagnosis not present

## 2018-02-24 DIAGNOSIS — L89322 Pressure ulcer of left buttock, stage 2: Secondary | ICD-10-CM | POA: Diagnosis not present

## 2018-02-24 DIAGNOSIS — J449 Chronic obstructive pulmonary disease, unspecified: Secondary | ICD-10-CM | POA: Diagnosis not present

## 2018-02-24 DIAGNOSIS — M316 Other giant cell arteritis: Secondary | ICD-10-CM | POA: Diagnosis not present

## 2018-02-24 DIAGNOSIS — K219 Gastro-esophageal reflux disease without esophagitis: Secondary | ICD-10-CM | POA: Diagnosis not present

## 2018-02-24 DIAGNOSIS — I5033 Acute on chronic diastolic (congestive) heart failure: Secondary | ICD-10-CM | POA: Diagnosis not present

## 2018-03-04 DIAGNOSIS — J449 Chronic obstructive pulmonary disease, unspecified: Secondary | ICD-10-CM | POA: Diagnosis not present

## 2018-03-04 DIAGNOSIS — L89322 Pressure ulcer of left buttock, stage 2: Secondary | ICD-10-CM | POA: Diagnosis not present

## 2018-03-04 DIAGNOSIS — K219 Gastro-esophageal reflux disease without esophagitis: Secondary | ICD-10-CM | POA: Diagnosis not present

## 2018-03-04 DIAGNOSIS — I130 Hypertensive heart and chronic kidney disease with heart failure and stage 1 through stage 4 chronic kidney disease, or unspecified chronic kidney disease: Secondary | ICD-10-CM | POA: Diagnosis not present

## 2018-03-04 DIAGNOSIS — H543 Unqualified visual loss, both eyes: Secondary | ICD-10-CM | POA: Diagnosis not present

## 2018-03-04 DIAGNOSIS — M316 Other giant cell arteritis: Secondary | ICD-10-CM | POA: Diagnosis not present

## 2018-03-04 DIAGNOSIS — D631 Anemia in chronic kidney disease: Secondary | ICD-10-CM | POA: Diagnosis not present

## 2018-03-04 DIAGNOSIS — N184 Chronic kidney disease, stage 4 (severe): Secondary | ICD-10-CM | POA: Diagnosis not present

## 2018-03-04 DIAGNOSIS — I5033 Acute on chronic diastolic (congestive) heart failure: Secondary | ICD-10-CM | POA: Diagnosis not present

## 2018-03-08 ENCOUNTER — Encounter: Payer: Self-pay | Admitting: Physician Assistant

## 2018-03-08 ENCOUNTER — Encounter: Payer: Self-pay | Admitting: *Deleted

## 2018-03-08 ENCOUNTER — Ambulatory Visit: Payer: Medicare HMO | Admitting: Physician Assistant

## 2018-03-08 VITALS — BP 134/58 | HR 81 | Ht 65.0 in | Wt 159.5 lb

## 2018-03-08 DIAGNOSIS — I350 Nonrheumatic aortic (valve) stenosis: Secondary | ICD-10-CM | POA: Diagnosis not present

## 2018-03-08 DIAGNOSIS — I481 Persistent atrial fibrillation: Secondary | ICD-10-CM | POA: Diagnosis not present

## 2018-03-08 DIAGNOSIS — I10 Essential (primary) hypertension: Secondary | ICD-10-CM

## 2018-03-08 DIAGNOSIS — I4819 Other persistent atrial fibrillation: Secondary | ICD-10-CM

## 2018-03-08 DIAGNOSIS — N183 Chronic kidney disease, stage 3 unspecified: Secondary | ICD-10-CM

## 2018-03-08 DIAGNOSIS — I5032 Chronic diastolic (congestive) heart failure: Secondary | ICD-10-CM

## 2018-03-08 NOTE — Progress Notes (Signed)
Cardiology Office Note:    Date:  03/08/2018   ID:  [name], DOB 28-Sep-1924, MRN [id number]  PCP:  Kirk Borg, MD  Cardiologist:  Kirk Haw, MD    Referring MD: Kirk Borg, MD   Chief Complaint  Patient presents with  . Follow-up    Atrial fibrillation     History of Present Illness:    [name] is a 84 y.o. 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  He was admitted in May with decompensated heart failure.  He was diuresed with IV Lasix.  He was seen in follow-up by Dr. Curt Chavez 02/03/2018 and noted to be in atrial fibrillation.  He was placed on anticoagulation with Apixaban.  CHADS2-VASc=6 (age x 2, CHF, CVA x 2, HTN).    [name] follow-up on atrial fibrillation with an eye towards cardioversion.  He is here today with his wife.  Since last seen, he does note improved swelling.  He denies significant worsening of shortness of breath.  He denies chest pain or syncope.  He denies PND.  He has noted excessive bruising.  Prior CV studies:   The following studies were reviewed today:  Echocardiogram 01/20/2018 EF 50-55, normal wall motion, grade 1 diastolic dysfunction, moderate aortic stenosis (mean 22, peak 39), mild to moderate AI, mild MR, mild to moderate LAE, PASP 37  Echocardiogram 11/27/2017 Moderate LVH, EF 50-55, normal wall motion, moderate to severe aortic stenosis (mean 30, peak 57), moderate AI, mild MR  Carotid US 10/28/2017 Bilateral ICA 1-39  Echocardiogram 02/05/2017 Mild focal basal septal hypertrophy, EF 22-48, normal diastolic function, moderate aortic stenosis (mean 17, peak 37), mild AI, MAC, mild MR, mild LAE, PASP 31  Past Medical History:  Diagnosis Date  . ALLERGIC RHINITIS 09/29/2007  . Aortic stenosis 02/05/2017  . Arthritis     "was in his knees" (01/19/2018)  . BACK PAIN 01/31/2009  . BENIGN PROSTATIC HYPERTROPHY 05/12/2007  . CHEST PAIN 01/31/2008  . CHRONIC OBSTRUCTIVE PULMONARY DISEASE, ACUTE EXACERBATION 08/17/2007  . CONJUNCTIVITIS, ALLERGIC, CHRONIC 03/24/2010  . CONSTIPATION 02/19/2010  . COPD 05/12/2007  . CVA (cerebral vascular accident) ([name]) 05/03/2011  . GERD 02/23/2010  . GOUT 07/04/2010  . Heart murmur   . History of kidney stones    "passed it"  . History of stomach ulcers   . HYPERLIPIDEMIA 05/12/2007  . HYPERTENSION 05/12/2007  . NEPHROLITHIASIS, HX OF   . OBESITY 05/12/2007  . PEPTIC ULCER DISEASE 05/12/2007  . PERIPHERAL VASCULAR DISEASE 05/12/2007  . Personal history of unspecified circulatory disease 05/12/2007  . PLANTAR FASCIITIS, RIGHT 09/27/2008  . RENAL CELL CANCER 05/12/2007  . RENAL INSUFFICIENCY 08/17/2007  . SKIN LESION 11/07/2010  . VERTIGO 09/29/2007  . WRIST PAIN, RIGHT 02/26/2009   Surgical Hx: The patient  has a past surgical history that includes Nephroureterectomy; Nephrectomy; Artery Biopsy (Bilateral, 10/27/2017); Total knee arthroplasty (Left, 06/2007); Femoral-popliteal Bypass Graft (Bilateral); Total knee arthroplasty (Right); Joint replacement; Cataract extraction w/ intraocular lens  implant, bilateral (Bilateral); and Inner ear surgery (Bilateral).   Current Medications: Current Meds  Medication Sig  . allopurinol (ZYLOPRIM) 100 MG tablet Take 100 mg by mouth as directed.  Marland Kitchen amLODipine (NORVASC) 5 MG tablet TAKE 1 TABLET (5 MG TOTAL) BY MOUTH DAILY.  Marland Kitchen apixaban (ELIQUIS) 2.5 MG TABS tablet Take 1 tablet (2.5 mg total) by  mouth 2 (two) times daily.  . cetirizine (ZYRTEC) 10 MG tablet Take 10 mg by mouth daily as needed for allergies.   Marland Kitchen docusate sodium (COLACE) 100 MG capsule Take 300 mg by mouth daily as needed for mild constipation.  . furosemide (LASIX) 20 MG tablet Take 2 tablets (40 mg total) by mouth daily.  Marland Kitchen guaiFENesin (MUCINEX) 600 MG 12 hr tablet Take 600 mg by  mouth 2 (two) times daily as needed for cough or to loosen phlegm.  . meclizine (ANTIVERT) 12.5 MG tablet Take 12.5 mg by mouth as directed.  . mirtazapine (REMERON) 15 MG tablet Take 1 tablet (15 mg total) by mouth at bedtime.  . polyethylene glycol (MIRALAX / GLYCOLAX) packet Take 17 g by mouth daily as needed for mild constipation.  . pravastatin (PRAVACHOL) 40 MG tablet Take 40 mg by mouth as directed.  . predniSONE (DELTASONE) 5 MG tablet 10 mg in the morning and 2.56m at night.     Allergies:   Sulfonamide derivatives   Social History   Tobacco Use  . Smoking status: Current Every Day Smoker    Packs/day: 0.50    Years: 80.00    Pack years: 40.00    Types: Cigarettes  . Smokeless tobacco: Never Used  Substance Use Topics  . Alcohol use: No  . Drug use: No     Family Hx: The patient's family history includes Alcohol abuse in his brother; Cancer in his daughter, mother, other, and other; Heart disease in his brother.  ROS:   Please see the history of present illness.    ROS All other systems reviewed and are negative.   EKGs/Labs/Other Test Reviewed:    EKG:  EKG is  ordered today.  The ekg ordered today demonstrates coarse atrial fibrillation versus probable atrial flutter, heart rate 91, right bundle branch block, PVCs  Recent Labs: 11/27/2017: TSH 1.045 01/19/2018: ALT 27; B Natriuretic Peptide 226.0 01/20/2018: Hemoglobin 11.3; Platelets 181 01/31/2018: BUN 56; Creatinine, Ser 1.93; Potassium 4.6; Sodium 137   Recent Lipid Panel Lab Results  Component Value Date/Time   CHOL 101 10/26/2017 02:17 AM   TRIG 103 10/26/2017 02:17 AM   HDL 37 (L) 10/26/2017 02:17 AM   CHOLHDL 2.7 10/26/2017 02:17 AM   LDLCALC 43 10/26/2017 02:17 AM    Physical Exam:    VS:  BP (!) 134/58   Pulse 81   Ht _0  (1.651 m)   Wt 159 lb 8 oz (72.3 kg)   SpO2 96%   BMI 26.54 kg/m     Wt Readings from Last 3 Encounters:  03/08/18 159 lb 8 oz (72.3 kg)  02/03/18 156 lb 4 oz (70.9  kg)  01/31/18 155 lb (70.3 kg)     Physical Exam  Constitutional: He is oriented to person, place, and time. He appears well-developed and well-nourished. No distress.  HENT:  Head: Normocephalic and atraumatic.  Neck: Neck supple.  Cardiovascular: Normal rate, S1 normal and S2 normal. An irregularly irregular rhythm present.  Murmur heard.  Harsh crescendo-decrescendo systolic murmur is present with a grade of 2/6 at the upper right sternal border. Pulmonary/Chest: Effort normal. He has no rales.  Abdominal: Soft.  Musculoskeletal: He exhibits edema (trace-1+ bilat ankle edema).  Neurological: He is alert and oriented to person, place, and time.  Skin: Skin is warm and dry.    ASSESSMENT & PLAN:    Persistent atrial fibrillation (HCC) Rate remains controlled.  He has remained on Apixaban since he was  last seen without interruption.  We discussed the benefits of proceeding with cardioversion.  He is willing to proceed.  Obtain BMET, CBC today.  Chronic diastolic CHF (congestive heart failure) (HCC)  Volume overall is improved since last seen.  Continue current dose of furosemide.  Obtain BMET today.  Nonrheumatic aortic valve stenosis Moderate aortic stenosis by recent echocardiogram.  He will need another echocardiogram in 6 to 12 months.  Essential hypertension  The patient's blood pressure is controlled on his current regimen.  Continue current therapy.   CKD (chronic kidney disease), stage III ([place])  Obtain follow-up BMET today.   Dispo:  Return in about 1 month (around 04/05/2018) for Post Procedure Follow Up with Dr. Curt Chavez.   Medication Adjustments/Labs and Tests Ordered: Current medicines are reviewed at length with the patient today.  Concerns regarding medicines are outlined above.  Tests Ordered: Orders Placed This Encounter  Procedures  . Basic Metabolic Panel (BMET)  . CBC  . EKG 12-Lead   Medication Changes: No orders of the defined types were placed in  this encounter.   Signed, Richardson Dopp, PA-C  03/08/2018 5:27 PM    [facility] [place], [place],   [address] Phone: [phone]; Fax: [phone]

## 2018-03-08 NOTE — Patient Instructions (Addendum)
Medication Instructions:  1. Your physician recommends that you continue on your current medications as directed. Please refer to the Current Medication list given to you today.   Labwork: TODAY BMET, CBC   Testing/Procedures: Your physician has recommended that you have a Cardioversion (DCCV). Electrical Cardioversion uses a jolt of electricity to your heart either through paddles or wired patches attached to your chest. This is a controlled, usually prescheduled, procedure. Defibrillation is done under light anesthesia in the hospital, and you usually go home the day of the procedure. This is done to get your heart back into a normal rhythm. You are not awake for the procedure. Please see the instruction sheet given to you today.    Follow-Up: DR. Curt Bears POST PROCEDURE ON 04/12/18 @ 3:30 PM   Any Other Special Instructions Will Be Listed Below (If Applicable).  If you need a refill on your cardiac medications before your next appointment, please call your pharmacy.

## 2018-03-08 NOTE — H&P (View-Only) (Signed)
Cardiology Office Note:    Date:  03/08/2018   ID:  [name], DOB 28-Sep-1924, MRN [id number]  PCP:  [name], MD  Cardiologist:  [name], MD    Referring MD: [name], MD   Chief Complaint  Patient presents with  . Follow-up    Atrial fibrillation     History of Present Illness:    [name] is a 84 y.o. 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  He was admitted in May with decompensated heart failure.  He was diuresed with IV Lasix.  He was seen in follow-up by Dr. Curt Bears 02/03/2018 and noted to be in atrial fibrillation.  He was placed on anticoagulation with Apixaban.  CHADS2-VASc=6 (age x 2, CHF, CVA x 2, HTN).    [name] follow-up on atrial fibrillation with an eye towards cardioversion.  He is here today with his wife.  Since last seen, he does note improved swelling.  He denies significant worsening of shortness of breath.  He denies chest pain or syncope.  He denies PND.  He has noted excessive bruising.  Prior CV studies:   The following studies were reviewed today:  Echocardiogram 01/20/2018 EF 50-55, normal wall motion, grade 1 diastolic dysfunction, moderate aortic stenosis (mean 22, peak 39), mild to moderate AI, mild MR, mild to moderate LAE, PASP 37  Echocardiogram 11/27/2017 Moderate LVH, EF 50-55, normal wall motion, moderate to severe aortic stenosis (mean 30, peak 57), moderate AI, mild MR  Carotid US 10/28/2017 Bilateral ICA 1-39  Echocardiogram 02/05/2017 Mild focal basal septal hypertrophy, EF 22-48, normal diastolic function, moderate aortic stenosis (mean 17, peak 37), mild AI, MAC, mild MR, mild LAE, PASP 31  Past Medical History:  Diagnosis Date  . ALLERGIC RHINITIS 09/29/2007  . Aortic stenosis 02/05/2017  . Arthritis     "was in his knees" (01/19/2018)  . BACK PAIN 01/31/2009  . BENIGN PROSTATIC HYPERTROPHY 05/12/2007  . CHEST PAIN 01/31/2008  . CHRONIC OBSTRUCTIVE PULMONARY DISEASE, ACUTE EXACERBATION 08/17/2007  . CONJUNCTIVITIS, ALLERGIC, CHRONIC 03/24/2010  . CONSTIPATION 02/19/2010  . COPD 05/12/2007  . CVA (cerebral vascular accident) ([place]) 05/03/2011  . GERD 02/23/2010  . GOUT 07/04/2010  . Heart murmur   . History of kidney stones    "passed it"  . History of stomach ulcers   . HYPERLIPIDEMIA 05/12/2007  . HYPERTENSION 05/12/2007  . NEPHROLITHIASIS, HX OF   . OBESITY 05/12/2007  . PEPTIC ULCER DISEASE 05/12/2007  . PERIPHERAL VASCULAR DISEASE 05/12/2007  . Personal history of unspecified circulatory disease 05/12/2007  . PLANTAR FASCIITIS, RIGHT 09/27/2008  . RENAL CELL CANCER 05/12/2007  . RENAL INSUFFICIENCY 08/17/2007  . SKIN LESION 11/07/2010  . VERTIGO 09/29/2007  . WRIST PAIN, RIGHT 02/26/2009   Surgical Hx: The patient  has a past surgical history that includes Nephroureterectomy; Nephrectomy; Artery Biopsy (Bilateral, 10/27/2017); Total knee arthroplasty (Left, 06/2007); Femoral-popliteal Bypass Graft (Bilateral); Total knee arthroplasty (Right); Joint replacement; Cataract extraction w/ intraocular lens  implant, bilateral (Bilateral); and Inner ear surgery (Bilateral).   Current Medications: Current Meds  Medication Sig  . allopurinol (ZYLOPRIM) 100 MG tablet Take 100 mg by mouth as directed.  Marland Kitchen amLODipine (NORVASC) 5 MG tablet TAKE 1 TABLET (5 MG TOTAL) BY MOUTH DAILY.  Marland Kitchen apixaban (ELIQUIS) 2.5 MG TABS tablet Take 1 tablet (2.5 mg total) by  mouth 2 (two) times daily.  . cetirizine (ZYRTEC) 10 MG tablet Take 10 mg by mouth daily as needed for allergies.   Marland Kitchen docusate sodium (COLACE) 100 MG capsule Take 300 mg by mouth daily as needed for mild constipation.  . furosemide (LASIX) 20 MG tablet Take 2 tablets (40 mg total) by mouth daily.  Marland Kitchen guaiFENesin (MUCINEX) 600 MG 12 hr tablet Take 600 mg by  mouth 2 (two) times daily as needed for cough or to loosen phlegm.  . meclizine (ANTIVERT) 12.5 MG tablet Take 12.5 mg by mouth as directed.  . mirtazapine (REMERON) 15 MG tablet Take 1 tablet (15 mg total) by mouth at bedtime.  . polyethylene glycol (MIRALAX / GLYCOLAX) packet Take 17 g by mouth daily as needed for mild constipation.  . pravastatin (PRAVACHOL) 40 MG tablet Take 40 mg by mouth as directed.  . predniSONE (DELTASONE) 5 MG tablet 10 mg in the morning and 2.56m at night.     Allergies:   Sulfonamide derivatives   Social History   Tobacco Use  . Smoking status: Current Every Day Smoker    Packs/day: 0.50    Years: 80.00    Pack years: 40.00    Types: Cigarettes  . Smokeless tobacco: Never Used  Substance Use Topics  . Alcohol use: No  . Drug use: No     Family Hx: The patient's family history includes Alcohol abuse in his brother; Cancer in his daughter, mother, other, and other; Heart disease in his brother.  ROS:   Please see the history of present illness.    ROS All other systems reviewed and are negative.   EKGs/Labs/Other Test Reviewed:    EKG:  EKG is  ordered today.  The ekg ordered today demonstrates coarse atrial fibrillation versus probable atrial flutter, heart rate 91, right bundle branch block, PVCs  Recent Labs: 11/27/2017: TSH 1.045 01/19/2018: ALT 27; B Natriuretic Peptide 226.0 01/20/2018: Hemoglobin 11.3; Platelets 181 01/31/2018: BUN 56; Creatinine, Ser 1.93; Potassium 4.6; Sodium 137   Recent Lipid Panel Lab Results  Component Value Date/Time   CHOL 101 10/26/2017 02:17 AM   TRIG 103 10/26/2017 02:17 AM   HDL 37 (L) 10/26/2017 02:17 AM   CHOLHDL 2.7 10/26/2017 02:17 AM   LDLCALC 43 10/26/2017 02:17 AM    Physical Exam:    VS:  BP (!) 134/58   Pulse 81   Ht _0  (1.651 m)   Wt 159 lb 8 oz (72.3 kg)   SpO2 96%   BMI 26.54 kg/m     Wt Readings from Last 3 Encounters:  03/08/18 159 lb 8 oz (72.3 kg)  02/03/18 156 lb 4 oz (70.9  kg)  01/31/18 155 lb (70.3 kg)     Physical Exam  Constitutional: He is oriented to person, place, and time. He appears well-developed and well-nourished. No distress.  HENT:  Head: Normocephalic and atraumatic.  Neck: Neck supple.  Cardiovascular: Normal rate, S1 normal and S2 normal. An irregularly irregular rhythm present.  Murmur heard.  Harsh crescendo-decrescendo systolic murmur is present with a grade of 2/6 at the upper right sternal border. Pulmonary/Chest: Effort normal. He has no rales.  Abdominal: Soft.  Musculoskeletal: He exhibits edema (trace-1+ bilat ankle edema).  Neurological: He is alert and oriented to person, place, and time.  Skin: Skin is warm and dry.    ASSESSMENT & PLAN:    Persistent atrial fibrillation (HCC) Rate remains controlled.  He has remained on Apixaban since he was  last seen without interruption.  We discussed the benefits of proceeding with cardioversion.  He is willing to proceed.  Obtain BMET, CBC today.  Chronic diastolic CHF (congestive heart failure) (HCC)  Volume overall is improved since last seen.  Continue current dose of furosemide.  Obtain BMET today.  Nonrheumatic aortic valve stenosis Moderate aortic stenosis by recent echocardiogram.  He will need another echocardiogram in 6 to 12 months.  Essential hypertension  The patient's blood pressure is controlled on his current regimen.  Continue current therapy.   CKD (chronic kidney disease), stage III ([place])  Obtain follow-up BMET today.   Dispo:  Return in about 1 month (around 04/05/2018) for Post Procedure Follow Up with Dr. Curt Bears.   Medication Adjustments/Labs and Tests Ordered: Current medicines are reviewed at length with the patient today.  Concerns regarding medicines are outlined above.  Tests Ordered: Orders Placed This Encounter  Procedures  . Basic Metabolic Panel (BMET)  . CBC  . EKG 12-Lead   Medication Changes: No orders of the defined types were placed in  this encounter.   Signed, Richardson Dopp, PA-C  03/08/2018 5:27 PM    [facility] [place], [place],   [address] Phone: [phone]; Fax: [phone]

## 2018-03-09 LAB — CBC
HEMOGLOBIN: 12.6 g/dL — AB (ref 13.0–17.7)
Hematocrit: 38.1 % (ref 37.5–51.0)
MCH: 32.4 pg (ref 26.6–33.0)
MCHC: 33.1 g/dL (ref 31.5–35.7)
MCV: 98 fL — AB (ref 79–97)
PLATELETS: 344 10*3/uL (ref 150–450)
RBC: 3.89 x10E6/uL — AB (ref 4.14–5.80)
RDW: 15.4 % (ref 12.3–15.4)
WBC: 10.6 10*3/uL (ref 3.4–10.8)

## 2018-03-09 LAB — BASIC METABOLIC PANEL
BUN / CREAT RATIO: 14 (ref 10–24)
BUN: 31 mg/dL (ref 10–36)
CO2: 24 mmol/L (ref 20–29)
CREATININE: 2.16 mg/dL — AB (ref 0.76–1.27)
Calcium: 8.8 mg/dL (ref 8.6–10.2)
Chloride: 101 mmol/L (ref 96–106)
GFR, EST AFRICAN AMERICAN: 30 mL/min/{1.73_m2} — AB (ref >59)
GFR, EST NON AFRICAN AMERICAN: 26 mL/min/{1.73_m2} — AB (ref >59)
Glucose: 199 mg/dL — ABNORMAL HIGH (ref 65–99)
POTASSIUM: 4.6 mmol/L (ref 3.5–5.2)
Sodium: 140 mmol/L (ref 134–144)

## 2018-03-10 DIAGNOSIS — K219 Gastro-esophageal reflux disease without esophagitis: Secondary | ICD-10-CM | POA: Diagnosis not present

## 2018-03-10 DIAGNOSIS — D631 Anemia in chronic kidney disease: Secondary | ICD-10-CM | POA: Diagnosis not present

## 2018-03-10 DIAGNOSIS — N184 Chronic kidney disease, stage 4 (severe): Secondary | ICD-10-CM | POA: Diagnosis not present

## 2018-03-10 DIAGNOSIS — I5033 Acute on chronic diastolic (congestive) heart failure: Secondary | ICD-10-CM | POA: Diagnosis not present

## 2018-03-10 DIAGNOSIS — H543 Unqualified visual loss, both eyes: Secondary | ICD-10-CM | POA: Diagnosis not present

## 2018-03-10 DIAGNOSIS — M316 Other giant cell arteritis: Secondary | ICD-10-CM | POA: Diagnosis not present

## 2018-03-10 DIAGNOSIS — J449 Chronic obstructive pulmonary disease, unspecified: Secondary | ICD-10-CM | POA: Diagnosis not present

## 2018-03-10 DIAGNOSIS — I130 Hypertensive heart and chronic kidney disease with heart failure and stage 1 through stage 4 chronic kidney disease, or unspecified chronic kidney disease: Secondary | ICD-10-CM | POA: Diagnosis not present

## 2018-03-10 DIAGNOSIS — L89322 Pressure ulcer of left buttock, stage 2: Secondary | ICD-10-CM | POA: Diagnosis not present

## 2018-03-16 DIAGNOSIS — I130 Hypertensive heart and chronic kidney disease with heart failure and stage 1 through stage 4 chronic kidney disease, or unspecified chronic kidney disease: Secondary | ICD-10-CM | POA: Diagnosis not present

## 2018-03-16 DIAGNOSIS — M316 Other giant cell arteritis: Secondary | ICD-10-CM | POA: Diagnosis not present

## 2018-03-16 DIAGNOSIS — N184 Chronic kidney disease, stage 4 (severe): Secondary | ICD-10-CM | POA: Diagnosis not present

## 2018-03-16 DIAGNOSIS — H543 Unqualified visual loss, both eyes: Secondary | ICD-10-CM | POA: Diagnosis not present

## 2018-03-16 DIAGNOSIS — K219 Gastro-esophageal reflux disease without esophagitis: Secondary | ICD-10-CM | POA: Diagnosis not present

## 2018-03-16 DIAGNOSIS — I5033 Acute on chronic diastolic (congestive) heart failure: Secondary | ICD-10-CM | POA: Diagnosis not present

## 2018-03-16 DIAGNOSIS — D631 Anemia in chronic kidney disease: Secondary | ICD-10-CM | POA: Diagnosis not present

## 2018-03-16 DIAGNOSIS — J449 Chronic obstructive pulmonary disease, unspecified: Secondary | ICD-10-CM | POA: Diagnosis not present

## 2018-03-16 DIAGNOSIS — L89322 Pressure ulcer of left buttock, stage 2: Secondary | ICD-10-CM | POA: Diagnosis not present

## 2018-03-21 ENCOUNTER — Ambulatory Visit (HOSPITAL_COMMUNITY): Payer: Medicare HMO | Admitting: Anesthesiology

## 2018-03-21 ENCOUNTER — Encounter (HOSPITAL_COMMUNITY)
Admission: RE | Disposition: A | Discharge status: Home / Self Care | Payer: Self-pay | Source: Ambulatory Visit | Attending: Cardiovascular Disease

## 2018-03-21 ENCOUNTER — Other Ambulatory Visit: Payer: Self-pay

## 2018-03-21 ENCOUNTER — Ambulatory Visit (HOSPITAL_COMMUNITY)
Admission: RE | Admit: 2018-03-21 | Discharge: 2018-03-21 | Disposition: A | Discharge status: Home / Self Care | Payer: Medicare HMO | Source: Ambulatory Visit | Attending: Cardiovascular Disease | Admitting: Cardiovascular Disease

## 2018-03-21 ENCOUNTER — Encounter (HOSPITAL_COMMUNITY): Payer: Self-pay | Admitting: *Deleted

## 2018-03-21 DIAGNOSIS — E669 Obesity, unspecified: Secondary | ICD-10-CM | POA: Insufficient documentation

## 2018-03-21 DIAGNOSIS — I481 Persistent atrial fibrillation: Secondary | ICD-10-CM | POA: Diagnosis not present

## 2018-03-21 DIAGNOSIS — I130 Hypertensive heart and chronic kidney disease with heart failure and stage 1 through stage 4 chronic kidney disease, or unspecified chronic kidney disease: Secondary | ICD-10-CM | POA: Insufficient documentation

## 2018-03-21 DIAGNOSIS — M109 Gout, unspecified: Secondary | ICD-10-CM | POA: Diagnosis not present

## 2018-03-21 DIAGNOSIS — I350 Nonrheumatic aortic (valve) stenosis: Secondary | ICD-10-CM | POA: Insufficient documentation

## 2018-03-21 DIAGNOSIS — Z7901 Long term (current) use of anticoagulants: Secondary | ICD-10-CM | POA: Diagnosis not present

## 2018-03-21 DIAGNOSIS — E785 Hyperlipidemia, unspecified: Secondary | ICD-10-CM | POA: Insufficient documentation

## 2018-03-21 DIAGNOSIS — I4819 Other persistent atrial fibrillation: Secondary | ICD-10-CM

## 2018-03-21 DIAGNOSIS — Z7952 Long term (current) use of systemic steroids: Secondary | ICD-10-CM | POA: Diagnosis not present

## 2018-03-21 DIAGNOSIS — Z6826 Body mass index (BMI) 26.0-26.9, adult: Secondary | ICD-10-CM | POA: Insufficient documentation

## 2018-03-21 DIAGNOSIS — I5032 Chronic diastolic (congestive) heart failure: Secondary | ICD-10-CM | POA: Insufficient documentation

## 2018-03-21 DIAGNOSIS — M1990 Unspecified osteoarthritis, unspecified site: Secondary | ICD-10-CM | POA: Diagnosis not present

## 2018-03-21 DIAGNOSIS — I129 Hypertensive chronic kidney disease with stage 1 through stage 4 chronic kidney disease, or unspecified chronic kidney disease: Secondary | ICD-10-CM | POA: Diagnosis not present

## 2018-03-21 DIAGNOSIS — N183 Chronic kidney disease, stage 3 (moderate): Secondary | ICD-10-CM | POA: Insufficient documentation

## 2018-03-21 DIAGNOSIS — K219 Gastro-esophageal reflux disease without esophagitis: Secondary | ICD-10-CM | POA: Insufficient documentation

## 2018-03-21 DIAGNOSIS — Z8673 Personal history of transient ischemic attack (TIA), and cerebral infarction without residual deficits: Secondary | ICD-10-CM | POA: Diagnosis not present

## 2018-03-21 DIAGNOSIS — I739 Peripheral vascular disease, unspecified: Secondary | ICD-10-CM | POA: Diagnosis not present

## 2018-03-21 DIAGNOSIS — J449 Chronic obstructive pulmonary disease, unspecified: Secondary | ICD-10-CM | POA: Insufficient documentation

## 2018-03-21 DIAGNOSIS — F17210 Nicotine dependence, cigarettes, uncomplicated: Secondary | ICD-10-CM | POA: Diagnosis not present

## 2018-03-21 HISTORY — PX: CARDIOVERSION: SHX1299

## 2018-03-21 LAB — POCT I-STAT 4, (NA,K, GLUC, HGB,HCT)
GLUCOSE: 97 mg/dL (ref 70–99)
HCT: 41 % (ref 39.0–52.0)
Hemoglobin: 13.9 g/dL (ref 13.0–17.0)
POTASSIUM: 3.3 mmol/L — AB (ref 3.5–5.1)
Sodium: 141 mmol/L (ref 135–145)

## 2018-03-21 SURGERY — CARDIOVERSION
Anesthesia: General

## 2018-03-21 MED ORDER — SODIUM CHLORIDE 0.9% FLUSH: 3.0000 mL | Freq: Two times a day (BID) | INTRAVENOUS | Status: DC

## 2018-03-21 MED ORDER — LIDOCAINE 2% (20 MG/ML) 5 ML SYRINGE
INTRAMUSCULAR | Status: DC | PRN
  Administered 2018-03-21: 60 mg via INTRAVENOUS

## 2018-03-21 MED ORDER — PROPOFOL 10 MG/ML IV BOLUS
INTRAVENOUS | Status: DC | PRN
  Administered 2018-03-21: 40 mg via INTRAVENOUS

## 2018-03-21 MED ORDER — SODIUM CHLORIDE 0.9% FLUSH: 3.0000 mL | INTRAVENOUS | Status: DC | PRN

## 2018-03-21 MED ORDER — HYDROCORTISONE 1 % EX CREA
1.0000 "application " | TOPICAL_CREAM | Freq: Three times a day (TID) | CUTANEOUS | Status: DC | PRN
  Filled 2018-03-21: qty 28

## 2018-03-21 MED ORDER — SODIUM CHLORIDE 0.9 % IV SOLN
250.0000 mL | INTRAVENOUS | Status: DC
  Administered 2018-03-21 (×2): via INTRAVENOUS

## 2018-03-21 NOTE — CV Procedure (Addendum)
Electrical Cardioversion Procedure Note [name] [id number] 1925-05-15  Procedure: Electrical Cardioversion Indications:  Atrial Fibrillation  Procedure Details Consent: Risks of procedure as well as the alternatives and risks of each were explained to the (patient/caregiver).  Consent for procedure obtained. Time Out: Verified patient identification, verified procedure, site/side was marked, verified correct patient position, special equipment/implants available, medications/allergies/relevent history reviewed, required imaging and test results available.  Performed  Patient placed on cardiac monitor, pulse oximetry, supplemental oxygen as necessary.  Sedation given: propofol Pacer pads placed anterior and posterior chest.  Cardioverted 1 time(s).  Cardioverted at 150J.  Evaluation Findings: Post procedure EKG shows: sinus rhythm with PVCs Complications: None Patient did tolerate procedure well.   Skeet Latch, MD 03/21/2018, 9:48 AM   Addendum:  [name] went back into atrial flutter after waking up from sedation and prior to obtaining an EKG.  Kirk [place] C. Oval Linsey, MD, [facility] 03/21/2018 10:24 AM

## 2018-03-21 NOTE — Interval H&P Note (Signed)
History and Physical Interval Note:  03/21/2018 9:48 AM  [name]  has presented today for surgery, with the diagnosis of afib  The various methods of treatment have been discussed with the patient and family. After consideration of risks, benefits and other options for treatment, the patient has consented to  Procedure(s): CARDIOVERSION (N/A) as a surgical intervention .  The patient's history has been reviewed, patient examined, no change in status, stable for surgery.  I have reviewed the patient's chart and labs.  Questions were answered to the patient's satisfaction.     Skeet Latch, MD

## 2018-03-21 NOTE — Anesthesia Procedure Notes (Signed)
Procedure Name: General with mask airway Date/Time: 03/21/2018 9:42 AM Performed by: Kyung Rudd, CRNA Pre-anesthesia Checklist: Patient identified, Emergency Drugs available, Suction available, Patient being monitored and Timeout performed Patient Re-evaluated:Patient Re-evaluated prior to induction Oxygen Delivery Method: Ambu bag Preoxygenation: Pre-oxygenation with 100% oxygen Induction Type: IV induction Ventilation: Mask ventilation without difficulty Placement Confirmation: positive ETCO2 Dental Injury: Teeth and Oropharynx as per pre-operative assessment

## 2018-03-21 NOTE — Transfer of Care (Signed)
Immediate Anesthesia Transfer of Care Note  Patient: [name]  Procedure(s) Performed: CARDIOVERSION (N/A )  Patient Location: Endoscopy Unit  Anesthesia Type:General  Level of Consciousness: awake, alert  and oriented  Airway & Oxygen Therapy: Patient Spontanous Breathing and Patient connected to nasal cannula oxygen  Post-op Assessment: Report given to RN, Post -op Vital signs reviewed and stable and Patient moving all extremities X 4  Post vital signs: Reviewed and stable  Last Vitals:  Vitals Value Taken Time  BP    Temp    Pulse    Resp    SpO2      Last Pain:  Vitals:   03/21/18 0852  TempSrc: Oral  PainSc: 0-No pain         Complications: No apparent anesthesia complications

## 2018-03-21 NOTE — Anesthesia Preprocedure Evaluation (Addendum)
Anesthesia Evaluation  Patient identified by MRN, date of birth, ID band Patient awake    Reviewed: Allergy & Precautions, NPO status , Patient's Chart, lab work & pertinent test results  Airway Mallampati: I       Dental  (+) Upper Dentures, Lower Dentures   Pulmonary Current Smoker,    Pulmonary exam normal        Cardiovascular hypertension,  Rhythm:Irregular  01/20/18 Echo Left ventricle: The cavity size was mildly dilated. Systolic   function was normal. The estimated ejection fraction was in the   range of 50% to 55%. Wall motion was normal; there were no   regional wall motion abnormalities. Doppler parameters are   consistent with abnormal left ventricular relaxation (grade 1   diastolic dysfunction).  Mod AS, AI  Mild Pulm HTN 37   Neuro/Psych CVA, No Residual Symptoms negative psych ROS   GI/Hepatic GERD  ,  Endo/Other    Renal/GU Renal disease     Musculoskeletal  (+) Arthritis ,   Abdominal   Peds  Hematology   Anesthesia Other Findings   Reproductive/Obstetrics                           Anesthesia Physical Anesthesia Plan  ASA: IV  Anesthesia Plan: General   Post-op Pain Management:    Induction: Intravenous  PONV Risk Score and Plan: Treatment may vary due to age or medical condition  Airway Management Planned: Mask  Additional Equipment:   Intra-op Plan:   Post-operative Plan:   Informed Consent: I have reviewed the patients History and Physical, chart, labs and discussed the procedure including the risks, benefits and alternatives for the proposed anesthesia with the patient or authorized representative who has indicated his/her understanding and acceptance.     Plan Discussed with:   Anesthesia Plan Comments:         Anesthesia Quick Evaluation

## 2018-03-21 NOTE — Discharge Instructions (Signed)
Electrical Cardioversion, Care After °This sheet gives you information about how to care for yourself after your procedure. Your health care provider may also give you more specific instructions. If you have problems or questions, contact your health care provider. °What can I expect after the procedure? °After the procedure, it is common to have: °· Some redness on the skin where the shocks were given. ° °Follow these instructions at home: °· Do not drive for 24 hours if you were given a medicine to help you relax (sedative). °· Take over-the-counter and prescription medicines only as told by your health care provider. °· Ask your health care provider how to check your pulse. Check it often. °· Rest for 48 hours after the procedure or as told by your health care provider. °· Avoid or limit your caffeine use as told by your health care provider. °Contact a health care provider if: °· You feel like your heart is beating too quickly or your pulse is not regular. °· You have a serious muscle cramp that does not go away. °Get help right away if: °· You have discomfort in your chest. °· You are dizzy or you feel faint. °· You have trouble breathing or you are short of breath. °· Your speech is slurred. °· You have trouble moving an arm or leg on one side of your body. °· Your fingers or toes turn cold or blue. °This information is not intended to replace advice given to you by your health care provider. Make sure you discuss any questions you have with your health care provider. °Document Released: 06/07/2013 Document Revised: 03/20/2016 Document Reviewed: 02/21/2016 °Elsevier Interactive Patient Education © 2018 [employer] ° °

## 2018-03-21 NOTE — Anesthesia Postprocedure Evaluation (Signed)
Anesthesia Post Note  Patient: [name]  Procedure(s) Performed: CARDIOVERSION (N/A )     Patient location during evaluation: Endoscopy Anesthesia Type: General Level of consciousness: awake and alert Pain management: pain level controlled Vital Signs Assessment: post-procedure vital signs reviewed and stable Respiratory status: spontaneous breathing, nonlabored ventilation, respiratory function stable and patient connected to nasal cannula oxygen Cardiovascular status: blood pressure returned to baseline and stable Postop Assessment: no apparent nausea or vomiting Anesthetic complications: no    Last Vitals:  Vitals:   03/21/18 0955 03/21/18 1010  BP: (!) 130/34 139/85  Pulse:  83  Resp: (!) 23 (!) 23  Temp: (!) 36.4 C   SpO2: 97% 96%    Last Pain:  Vitals:   03/21/18 1010  TempSrc:   PainSc: 0-No pain                 Barnet Glasgow

## 2018-03-23 DIAGNOSIS — H543 Unqualified visual loss, both eyes: Secondary | ICD-10-CM | POA: Diagnosis not present

## 2018-03-23 DIAGNOSIS — I5033 Acute on chronic diastolic (congestive) heart failure: Secondary | ICD-10-CM | POA: Diagnosis not present

## 2018-03-23 DIAGNOSIS — D631 Anemia in chronic kidney disease: Secondary | ICD-10-CM | POA: Diagnosis not present

## 2018-03-23 DIAGNOSIS — M316 Other giant cell arteritis: Secondary | ICD-10-CM | POA: Diagnosis not present

## 2018-03-23 DIAGNOSIS — N184 Chronic kidney disease, stage 4 (severe): Secondary | ICD-10-CM | POA: Diagnosis not present

## 2018-03-23 DIAGNOSIS — K219 Gastro-esophageal reflux disease without esophagitis: Secondary | ICD-10-CM | POA: Diagnosis not present

## 2018-03-23 DIAGNOSIS — L89322 Pressure ulcer of left buttock, stage 2: Secondary | ICD-10-CM | POA: Diagnosis not present

## 2018-03-23 DIAGNOSIS — J449 Chronic obstructive pulmonary disease, unspecified: Secondary | ICD-10-CM | POA: Diagnosis not present

## 2018-03-23 DIAGNOSIS — I130 Hypertensive heart and chronic kidney disease with heart failure and stage 1 through stage 4 chronic kidney disease, or unspecified chronic kidney disease: Secondary | ICD-10-CM | POA: Diagnosis not present

## 2018-03-24 ENCOUNTER — Encounter (HOSPITAL_COMMUNITY): Payer: Self-pay | Admitting: Cardiovascular Disease

## 2018-03-25 ENCOUNTER — Ambulatory Visit (INDEPENDENT_AMBULATORY_CARE_PROVIDER_SITE_OTHER): Payer: Medicare HMO | Admitting: *Deleted

## 2018-03-25 VITALS — BP 144/72 | HR 92 | Resp 18 | Ht 65.0 in | Wt 162.0 lb

## 2018-03-25 DIAGNOSIS — Z0000 Encounter for general adult medical examination without abnormal findings: Secondary | ICD-10-CM | POA: Diagnosis not present

## 2018-03-25 NOTE — Progress Notes (Addendum)
Subjective:   [name] is a 84 y.o. male who presents for Medicare Annual/Subsequent preventive examination.  Review of Systems:  No ROS.  Medicare Wellness Visit. Additional risk factors are reflected in the social history.  Cardiac Risk Factors include: advanced age (>85mn, >>24women);dyslipidemia;male gender;hypertension Sleep patterns: gets up 1 times nightly to void and sleeps 8-9 hours nightly.       Home Safety/Smoke Alarms: Feels safe in home. Smoke alarms in place.  Living environment; residence and Firearm Safety: 1-story house/ trailer, equipment: Walkers, Type: RConservation officer, natureand wheelchair, no firearms. Lives with wife and daughter, no needs for DME, good support system Seat Belt Safety/Bike Helmet: Wears seat belt.   PSA-  Lab Results  Component Value Date   PSA 0.73 10/18/2017   PSA 0.94 03/18/2010   PSA 1.51 03/21/2009       Objective:    Vitals: BP (!) 144/72   Pulse 92   Resp 18   Ht _0  (1.651 m)   Wt 162 lb (73.5 kg)   SpO2 95%   BMI 26.96 kg/m   Body mass index is 26.96 kg/m.  Advanced Directives 03/25/2018 03/21/2018 01/19/2018 11/27/2017 10/27/2017 10/25/2017  Does Patient Have a Medical Advance Directive? _1  No  Would patient like information on creating a medical advance directive? No - Patient declined No - Patient declined No - Patient declined No - Patient declined No - Patient declined -    Tobacco Social History   Tobacco Use  Smoking Status Current Every Day Smoker  . Packs/day: 0.50  . Years: 80.00  . Pack years: 40.00  . Types: Cigarettes  Smokeless Tobacco Never Used     Ready to quit: Not Answered Counseling given: Not Answered  Past Medical History:  Diagnosis Date  . ALLERGIC RHINITIS 09/29/2007  . Aortic stenosis 02/05/2017  . Arthritis    "was in his knees" (01/19/2018)  . BACK PAIN 01/31/2009  . BENIGN PROSTATIC HYPERTROPHY 05/12/2007  . CHEST PAIN 01/31/2008  . CHRONIC OBSTRUCTIVE PULMONARY DISEASE,  ACUTE EXACERBATION 08/17/2007  . CONJUNCTIVITIS, ALLERGIC, CHRONIC 03/24/2010  . CONSTIPATION 02/19/2010  . COPD 05/12/2007  . CVA (cerebral vascular accident) [place] 05/03/2011  . GERD 02/23/2010  . GOUT 07/04/2010  . Heart murmur   . History of kidney stones    "passed it"  . History of stomach ulcers   . HYPERLIPIDEMIA 05/12/2007  . HYPERTENSION 05/12/2007  . NEPHROLITHIASIS, HX OF   . OBESITY 05/12/2007  . PEPTIC ULCER DISEASE 05/12/2007  . PERIPHERAL VASCULAR DISEASE 05/12/2007  . Personal history of unspecified circulatory disease 05/12/2007  . PLANTAR FASCIITIS, RIGHT 09/27/2008  . RENAL CELL CANCER 05/12/2007  . RENAL INSUFFICIENCY 08/17/2007  . SKIN LESION 11/07/2010  . VERTIGO 09/29/2007  . WRIST PAIN, RIGHT 02/26/2009   Past Surgical History:  Procedure Laterality Date  . ARTERY BIOPSY Bilateral 10/27/2017   Procedure: BILATERAL TEMPORAL ARTERY BIOPSY;  Surgeon: BSerafina Mitchell MD;  [facility];  Service: Vascular;  Laterality: Bilateral;  . CARDIOVERSION N/A 03/21/2018   Procedure: CARDIOVERSION;  Surgeon: RSkeet Latch MD;  [facility]: [facility]  Service: Cardiovascular;  Laterality: N/A;  . CATARACT EXTRACTION W/ INTRAOCULAR LENS  IMPLANT, BILATERAL Bilateral   . FEMORAL-POPLITEAL BYPASS GRAFT Bilateral   . INNER EAR SURGERY Bilateral    "had it twice in one of his ears; once in the other"  . JOINT REPLACEMENT    . NEPHRECTOMY    . NEPHROURETERECTOMY    .  TOTAL KNEE ARTHROPLASTY Left 06/2007  . TOTAL KNEE ARTHROPLASTY Right    Family History  Problem Relation Age of Onset  . Cancer Mother        colon cancer  . Heart disease Brother   . Alcohol abuse Brother   . Cancer Daughter        bladder cancer  . Cancer Other        grandson leukemia and BMT  . Cancer Other        sibling with pancreatic cancer   Social History   Socioeconomic History  . Marital status: Married    Spouse name: Not on file  . Number of children: 4  . Years of education: 5  .  Highest education level: Not on file  Occupational History  . Occupation: retired Solicitor: RETIRED  Social Needs  . Financial resource strain: Not hard at all  . Food insecurity:    Worry: Never true    Inability: Never true  . Transportation needs:    Medical: No    Non-medical: No  Tobacco Use  . Smoking status: Current Every Day Smoker    Packs/day: 0.50    Years: 80.00    Pack years: 40.00    Types: Cigarettes  . Smokeless tobacco: Never Used  Substance and Sexual Activity  . Alcohol use: No  . Drug use: No  . Sexual activity: Not Currently  Lifestyle  . Physical activity:    Days per week: 4 days    Minutes per session: 20 min  . Stress: Only a little  Relationships  . Social connections:    Talks on phone: More than three times a week    Gets together: More than three times a week    Attends religious service: More than 4 times per year    Active member of club or organization: Yes    Attends meetings of clubs or organizations: More than 4 times per year    Relationship status: Married  Other Topics Concern  . Not on file  Social History Narrative   Married for 71 yrs as of June 2019. Met in a bowling alley.     Outpatient Encounter Medications as of 03/25/2018  Medication Sig  . allopurinol (ZYLOPRIM) 100 MG tablet Take 100 mg by mouth daily.   Marland Kitchen amLODipine (NORVASC) 5 MG tablet TAKE 1 TABLET (5 MG TOTAL) BY MOUTH DAILY.  Marland Kitchen apixaban (ELIQUIS) 2.5 MG TABS tablet Take 1 tablet (2.5 mg total) by mouth 2 (two) times daily.  . cetirizine (ZYRTEC) 10 MG tablet Take 10 mg by mouth daily as needed for allergies.   Marland Kitchen docusate sodium (COLACE) 100 MG capsule Take 200 mg by mouth daily as needed for mild constipation.   . furosemide (LASIX) 20 MG tablet Take 2 tablets (40 mg total) by mouth daily. (Patient taking differently: Take 40-80 mg by mouth daily as needed for fluid. )  . meclizine (ANTIVERT) 12.5 MG tablet Take 12.5 mg by mouth daily.   . mirtazapine  (REMERON) 15 MG tablet Take 1 tablet (15 mg total) by mouth at bedtime.  . polyethylene glycol (MIRALAX / GLYCOLAX) packet Take 17 g by mouth daily as needed for mild constipation.  . pravastatin (PRAVACHOL) 40 MG tablet Take 40 mg by mouth at bedtime.   . predniSONE (DELTASONE) 1 MG tablet Take 9 mg by mouth daily  . omeprazole (PRILOSEC) 40 MG capsule Take 1 capsule (40 mg total) by mouth daily.  No facility-administered encounter medications on file as of 03/25/2018.     Activities of Daily Living In your present state of health, do you have any difficulty performing the following activities: 03/25/2018 01/19/2018  Hearing? Tempie Donning  Vision? Y Y  Comment - "only able to see silhoette out of left eye; no vision in right"  Difficulty concentrating or making decisions? N N  Walking or climbing stairs? N Y  Dressing or bathing? Y Y  Doing errands, shopping? Tempie Donning  Preparing Food and eating ? Y -  Using the Toilet? N -  In the past six months, have you accidently leaked urine? Y -  Do you have problems with loss of bowel control? N -  Managing your Medications? Y -  Managing your Finances? Y -  Housekeeping or managing your Housekeeping? Y -  Some recent data might be hidden    Patient Care Team: [name], MD as PCP - General Curt Bears, Ocie Doyne, MD as PCP - Cardiology (Cardiology)   Assessment:   This is a routine wellness examination for [facility]. Physical assessment deferred to PCP.   Exercise Activities and Dietary recommendations Current Exercise Habits: Home exercise routine, Time (Minutes): 20, Frequency (Times/Week): 7, Weekly Exercise (Minutes/Week): 140, Intensity: Mild, Exercise limited by: orthopedic condition(s)  Diet (meal preparation, eat out, water intake, caffeinated beverages, dairy products, fruits and vegetables): in general, a "healthy" diet    Reports poor appetite  Reviewed heart healthy diet, patient is on fluid restriction and strict sodium restriction due to  heart. He does supplement with Boost (ensure samples and coupons were provided)  Goals    . Patient Stated     Continue to enjoy church, family and worship God.       Fall Risk Fall Risk  03/25/2018 01/13/2018 01/05/2017 05/09/2016 01/01/2016  Falls in the past year? _0   Number falls in past yr: - - - - -  Comment - - - - -  Injury with Fall? - - - - -  Risk for fall due to : Impaired balance/gait;Impaired mobility;History of fall(s) - - - -    Depression Screen PHQ 2/9 Scores 03/25/2018 01/13/2018 01/05/2017 05/09/2016  PHQ - 2 Score 2 0 0 0  PHQ- 9 Score 9 - - -    Cognitive Function MMSE - Mini Mental State Exam 03/25/2018  Not completed: Unable to complete        Immunization History  Administered Date(s) Administered  . Influenza Split 05/06/2012  . Influenza Whole 07/13/2002, 08/17/2007, 09/23/2009, 05/08/2011  . Influenza, High Dose Seasonal PF 06/27/2013, 07/04/2015, 06/10/2017  . Influenza,inj,Quad PF,6+ Mos 06/19/2014  . Pneumococcal Conjugate-13 07/11/2013  . Pneumococcal Polysaccharide-23 07/30/2006, 05/08/2011  . Td 09/27/2008  . Zoster 07/30/2006   Screening Tests Health Maintenance  Topic Date Due  . INFLUENZA VACCINE  03/31/2018  . TETANUS/TDAP  09/27/2018  . PNA vac Low Risk Adult  Completed      Plan:     Continue doing brain stimulating activities (puzzles, reading, adult coloring books, staying active) to keep memory sharp.   Continue to eat heart healthy diet (full of fruits, vegetables, whole grains, lean protein, water--limit salt, fat, and sugar intake) and increase physical activity as tolerated.  I have personally reviewed and noted the following in the patient's chart:   . Medical and social history . Use of alcohol, tobacco or illicit drugs  . Current medications and supplements . Functional ability and status . Nutritional  status . Physical activity . Advanced directives . List of other physicians . Vitals . Screenings to  include cognitive, depression, and falls . Referrals and appointments  In addition, I have reviewed and discussed with patient certain preventive protocols, quality metrics, and best practice recommendations. A written personalized care plan for preventive services as well as general preventive health recommendations were provided to patient.     Michiel Cowboy, RN  03/25/2018   Medical screening examination/treatment/procedure(s) were performed by non-physician practitioner and as supervising provider I was immediately available for consultation/collaboration.  I agree with above. Marrian Salvage, FNP

## 2018-03-25 NOTE — Patient Instructions (Addendum)
Continue doing brain stimulating activities (puzzles, reading, adult coloring books, staying active) to keep memory sharp.   Continue to eat heart healthy diet (full of fruits, vegetables, whole grains, lean protein, water--limit salt, fat, and sugar intake) and increase physical activity as tolerated.   [name] , Thank you for taking time to come for your Medicare Wellness Visit. I appreciate your ongoing commitment to your health goals. Please review the following plan we discussed and let me know if I can assist you in the future.   These are the goals we discussed: Goals    . Patient Stated     Continue to enjoy church, family and worship God.       This is a list of the screening recommended for you and due dates:  Health Maintenance  Topic Date Due  . Flu Shot  03/31/2018  . Tetanus Vaccine  09/27/2018  . Pneumonia vaccines  Completed   It is important to avoid accidents which may result in broken bones.  Here are a few ideas on how to make your home safer so you will be less likely to trip or fall.  1. Use nonskid mats or non slip strips in your shower or tub, on your bathroom floor and around sinks.  If you know that you have spilled water, wipe it up! 2. In the bathroom, it is important to have properly installed grab bars on the walls or on the edge of the tub.  Towel racks are NOT strong enough for you to hold onto or to pull on for support. 3. Stairs and hallways should have enough light.  Add lamps or night lights if you need ore light. 4. It is good to have handrails on both sides of the stairs if possible.  Always fix broken handrails right away. 5. It is important to see the edges of steps.  Paint the edges of outdoor steps white so you can see them better.  Put colored tape on the edge of inside steps. 6. Throw-rugs are dangerous because they can slide.  Removing the rugs is the best idea, but if they must stay, add adhesive carpet tape to prevent slipping. 7. Do not  keep things on stairs or in the halls.  Remove small furniture that blocks the halls as it may cause you to trip.  Keep telephone and electrical cords out of the way where you walk. 8. Always were sturdy, rubber-soled shoes for good support.  Never wear just socks, especially on the stairs.  Socks may cause you to slip or fall.  Do not wear full-length housecoats as you can easily trip on the bottom.  9. Place the things you use the most on the shelves that are the easiest to reach.  If you use a stepstool, make sure it is in good condition.  If you feel unsteady, DO NOT climb, ask for help. 10. If a health professional advises you to use a cane or walker, do not be ashamed.  These items can keep you from falling and breaking your bones.

## 2018-04-12 ENCOUNTER — Encounter: Payer: Self-pay | Admitting: Cardiology

## 2018-04-12 ENCOUNTER — Ambulatory Visit: Payer: Medicare HMO | Admitting: Cardiology

## 2018-04-12 VITALS — BP 130/66 | HR 87 | Ht 65.0 in | Wt 161.4 lb

## 2018-04-12 DIAGNOSIS — I10 Essential (primary) hypertension: Secondary | ICD-10-CM | POA: Diagnosis not present

## 2018-04-12 DIAGNOSIS — I4819 Other persistent atrial fibrillation: Secondary | ICD-10-CM

## 2018-04-12 DIAGNOSIS — I5032 Chronic diastolic (congestive) heart failure: Secondary | ICD-10-CM

## 2018-04-12 DIAGNOSIS — I350 Nonrheumatic aortic (valve) stenosis: Secondary | ICD-10-CM

## 2018-04-12 DIAGNOSIS — I481 Persistent atrial fibrillation: Secondary | ICD-10-CM

## 2018-04-12 NOTE — Progress Notes (Signed)
Electrophysiology Office Note   Date:  04/12/2018   ID:  [name], DOB 03/02/1925, MRN [id number]  PCP:  [name], MD Primary Electrophysiologist:  [name], MD    No chief complaint on file.    History of Present Illness: [name] is a 84 y.o. male who is being seen today for the evaluation of heart murmur at the request of [name], MD. Presenting today for electrophysiology evaluation. He is found to have a murmur by his primary physician who ordered an echocardiogram which showed moderate aortic stenosis as well as mild mitral regurgitation.  Today, denies symptoms of palpitations, chest pain, shortness of breath, orthopnea, PND, lower extremity edema, claudication, dizziness, presyncope, syncope, bleeding, or neurologic sequela. The patient is tolerating medications without difficulties.  He is doing well.  He is not have any major complaints.  He had a cardioversion 03/21/2018, but has reverted back to atrial fibrillation.  He does not have any complaints of weakness, fatigue, shortness of breath.  Of note the patient is a World War II veteran.  Past Medical History:  Diagnosis Date  . ALLERGIC RHINITIS 09/29/2007  . Aortic stenosis 02/05/2017  . Arthritis    "was in his knees" (01/19/2018)  . BACK PAIN 01/31/2009  . BENIGN PROSTATIC HYPERTROPHY 05/12/2007  . CHEST PAIN 01/31/2008  . CHRONIC OBSTRUCTIVE PULMONARY DISEASE, ACUTE EXACERBATION 08/17/2007  . CONJUNCTIVITIS, ALLERGIC, CHRONIC 03/24/2010  . CONSTIPATION 02/19/2010  . COPD 05/12/2007  . CVA (cerebral vascular accident) ([place]) 05/03/2011  . GERD 02/23/2010  . GOUT 07/04/2010  . Heart murmur   . History of kidney stones    "passed it"  . History of stomach ulcers   . HYPERLIPIDEMIA 05/12/2007  . HYPERTENSION 05/12/2007  . NEPHROLITHIASIS, HX OF   . OBESITY 05/12/2007  . PEPTIC ULCER DISEASE 05/12/2007  . PERIPHERAL VASCULAR DISEASE 05/12/2007  . Personal history of unspecified circulatory  disease 05/12/2007  . PLANTAR FASCIITIS, RIGHT 09/27/2008  . RENAL CELL CANCER 05/12/2007  . RENAL INSUFFICIENCY 08/17/2007  . SKIN LESION 11/07/2010  . VERTIGO 09/29/2007  . WRIST PAIN, RIGHT 02/26/2009   Past Surgical History:  Procedure Laterality Date  . ARTERY BIOPSY Bilateral 10/27/2017   Procedure: BILATERAL TEMPORAL ARTERY BIOPSY;  Surgeon: Serafina Mitchell, MD;  Location: [facility];  Service: Vascular;  Laterality: Bilateral;  . CARDIOVERSION N/A 03/21/2018   Procedure: CARDIOVERSION;  Surgeon: Skeet Latch, MD;  Location: [place];  Service: Cardiovascular;  Laterality: N/A;  . CATARACT EXTRACTION W/ INTRAOCULAR LENS  IMPLANT, BILATERAL Bilateral   . FEMORAL-POPLITEAL BYPASS GRAFT Bilateral   . INNER EAR SURGERY Bilateral    "had it twice in one of his ears; once in the other"  . JOINT REPLACEMENT    . NEPHRECTOMY    . NEPHROURETERECTOMY    . TOTAL KNEE ARTHROPLASTY Left 06/2007  . TOTAL KNEE ARTHROPLASTY Right      Current Outpatient Medications  Medication Sig Dispense Refill  . allopurinol (ZYLOPRIM) 100 MG tablet Take 100 mg by mouth daily.     Marland Kitchen amLODipine (NORVASC) 5 MG tablet TAKE 1 TABLET (5 MG TOTAL) BY MOUTH DAILY. 90 tablet 3  . apixaban (ELIQUIS) 2.5 MG TABS tablet Take 1 tablet (2.5 mg total) by mouth 2 (two) times daily. 60 tablet 3  . cetirizine (ZYRTEC) 10 MG tablet Take 10 mg by mouth daily as needed for allergies.     Marland Kitchen docusate sodium (COLACE) 100 MG capsule Take 200 mg by  mouth daily as needed for mild constipation.     . furosemide (LASIX) 20 MG tablet Take 2 tablets (40 mg total) by mouth daily. 30 tablet 0  . meclizine (ANTIVERT) 12.5 MG tablet Take 12.5 mg by mouth daily.     . mirtazapine (REMERON) 15 MG tablet Take 1 tablet (15 mg total) by mouth at bedtime. 90 tablet 3  . omeprazole (PRILOSEC) 40 MG capsule Take 1 capsule (40 mg total) by mouth daily. 30 capsule 2  . polyethylene glycol (MIRALAX / GLYCOLAX) packet Take 17 g by mouth daily as  needed for mild constipation.    . pravastatin (PRAVACHOL) 40 MG tablet Take 40 mg by mouth at bedtime.     . predniSONE (DELTASONE) 1 MG tablet Take 8 mg by mouth daily with breakfast.     No current facility-administered medications for this visit.     Allergies:   Sulfonamide derivatives   Social History:  The patient  reports that he has been smoking cigarettes. He has a 40.00 pack-year smoking history. He has never used smokeless tobacco. He reports that he does not drink alcohol or use drugs.   Family History:  The patient's family history includes Alcohol abuse in his brother; Cancer in his daughter, mother, other, and other; Heart disease in his brother.   ROS:  Please see the history of present illness.   Otherwise, review of systems is positive for none.   All other systems are reviewed and negative.   PHYSICAL EXAM: VS:  BP 130/66   Pulse 87   Ht 5\' 5"  (1.651 m)   Wt 161 lb 6.4 oz (73.2 kg)   SpO2 95%   BMI 26.86 kg/m  , BMI Body mass index is 26.86 kg/m. GEN: Well nourished, well developed, in no acute distress  HEENT: normal  Neck: no JVD, carotid bruits, or masses Cardiac: iRRR; no murmurs, rubs, or gallops,no edema  Respiratory:  clear to auscultation bilaterally, normal work of breathing GI: soft, nontender, nondistended, + BS MS: no deformity or atrophy  Skin: warm and dry Neuro:  Strength and sensation are intact Psych: euthymic mood, full affect  EKG:  EKG is ordered today. Personal review of the ekg ordered shows atrial fibrillation, rate 87, PVCs right bundle branch block   Recent Labs: 11/27/2017: TSH 1.045 01/19/2018: ALT 27; B Natriuretic Peptide 226.0 03/08/2018: BUN 31; Creatinine, Ser 2.16; Platelets 344 03/21/2018: Hemoglobin 13.9; Potassium 3.3; Sodium 141    Lipid Panel     Component Value Date/Time   CHOL 101 10/26/2017 0217   TRIG 103 10/26/2017 0217   HDL 37 (L) 10/26/2017 0217   CHOLHDL 2.7 10/26/2017 0217   VLDL 21 10/26/2017 0217    LDLCALC 43 10/26/2017 0217     Wt Readings from Last 3 Encounters:  04/12/18 161 lb 6.4 oz (73.2 kg)  03/25/18 162 lb (73.5 kg)  03/21/18 159 lb (72.1 kg)      Other studies Reviewed: Additional studies/ records that were reviewed today include: TTE 5723/19 Review of the above records today demonstrates:  - Left ventricle: The cavity size was mildly dilated. Systolic   function was normal. The estimated ejection fraction was in the   range of 50% to 55%. Wall motion was normal; there were no   regional wall motion abnormalities. Doppler parameters are   consistent with abnormal left ventricular relaxation (grade 1   diastolic dysfunction). - Aortic valve: Valve mobility was restricted. There was moderate   stenosis. There was  mild to moderate regurgitation. Mean gradient   (S): 22 mm Hg. Valve area (VTI): 1.15 cm^2. Valve area (Vmax):   1.08 cm^2. Valve area (Vmean): 1.12 cm^2. Regurgitation pressure   half-time: 572 ms. - Mitral valve: Mild thickening and calcification, with moderate   involvement of chords. There was mild regurgitation. - Left atrium: The atrium was mildly to moderately dilated. - Pulmonic valve: There was trivial regurgitation. - Pulmonary arteries: PA peak pressure: 37 mm Hg (S).   ASSESSMENT AND PLAN:  1.  Moderate aortic stenosis: Most recent echo shows moderate level stenosis.  Unlikely because of heart failure exacerbations.  No indications for valve replacement at this time.  2. Mild Mitral regurgitation: Mild on most recent echo.  No changes.  3. Hypertension: Well-controlled today.  No changes.  4. Tobacco abuse: No interest in quitting at this time  5.  Diastolic heart failure: No obvious signs of volume overload.  Is currently on Lasix.  No changes.  6.  Persistent atrial fibrillation: Currently on Eliquis.  Was cardioverted 03/21/2018.  He has reverted back to atrial fibrillation.  I think a rate control strategy is best option because he is  not feeling poorly.  No changes at this time.  This patients CHA2DS2-VASc Score and unadjusted Ischemic Stroke Rate (% per year) is equal to 3.2 % stroke rate/year from a score of 3  Above score calculated as 1 point each if present [CHF, HTN, DM, Vascular=MI/PAD/Aortic Plaque, Age if 65-74, or Male] Above score calculated as 2 points each if present [Age > 75, or Stroke/TIA/TE]   Current medicines are reviewed at length with the patient today.   The patient does not have concerns regarding his medicines.  The following changes were made today: None  Labs/ tests ordered today include:  Orders Placed This Encounter  Procedures  . EKG 12-Lead     Disposition:   FU with Jolinda Pinkstaff 6 months  Signed, Javon Snee Meredith Leeds, MD  04/12/2018 3:42 PM     [address] [place] [address] [phone] (office) [phone] (fax)

## 2018-04-12 NOTE — Patient Instructions (Signed)
Medication Instructions:  Your physician recommends that you continue on your current medications as directed. Please refer to the Current Medication list given to you today.  * If you need a refill on your cardiac medications before your next appointment, please call your pharmacy.   Labwork: None ordered  Testing/Procedures: None ordered  Follow-Up: Your physician wants you to follow-up in: 6 months with Dr. Camnitz.  You will receive a reminder letter in the mail two months in advance. If you don't receive a letter, please call our office to schedule the follow-up appointment.  *Please note that any paperwork needing to be filled out by the provider will need to be addressed at the front desk prior to seeing the provider. Please note that any FMLA, disability or other documents regarding health condition is subject to a $25.00 charge that must be received prior to completion of paperwork in the form of a money order or check.  Thank you for choosing [facility]!!   Sahmir Weatherbee, RN [phone]        

## 2018-04-20 DIAGNOSIS — R0602 Shortness of breath: Secondary | ICD-10-CM | POA: Diagnosis not present

## 2018-04-20 DIAGNOSIS — R600 Localized edema: Secondary | ICD-10-CM | POA: Diagnosis not present

## 2018-04-20 DIAGNOSIS — N183 Chronic kidney disease, stage 3 (moderate): Secondary | ICD-10-CM | POA: Diagnosis not present

## 2018-04-20 DIAGNOSIS — J449 Chronic obstructive pulmonary disease, unspecified: Secondary | ICD-10-CM | POA: Diagnosis not present

## 2018-04-20 DIAGNOSIS — M316 Other giant cell arteritis: Secondary | ICD-10-CM | POA: Diagnosis not present

## 2018-04-20 DIAGNOSIS — R05 Cough: Secondary | ICD-10-CM | POA: Diagnosis not present

## 2018-04-20 DIAGNOSIS — T887XXA Unspecified adverse effect of drug or medicament, initial encounter: Secondary | ICD-10-CM | POA: Diagnosis not present

## 2018-04-20 DIAGNOSIS — M7989 Other specified soft tissue disorders: Secondary | ICD-10-CM | POA: Diagnosis not present

## 2018-04-20 DIAGNOSIS — R9389 Abnormal findings on diagnostic imaging of other specified body structures: Secondary | ICD-10-CM | POA: Diagnosis not present

## 2018-04-26 DIAGNOSIS — H6123 Impacted cerumen, bilateral: Secondary | ICD-10-CM | POA: Diagnosis not present

## 2018-05-09 DIAGNOSIS — C44319 Basal cell carcinoma of skin of other parts of face: Secondary | ICD-10-CM | POA: Diagnosis not present

## 2018-05-09 DIAGNOSIS — D485 Neoplasm of uncertain behavior of skin: Secondary | ICD-10-CM | POA: Diagnosis not present

## 2018-05-09 DIAGNOSIS — L738 Other specified follicular disorders: Secondary | ICD-10-CM | POA: Diagnosis not present

## 2018-05-09 DIAGNOSIS — L570 Actinic keratosis: Secondary | ICD-10-CM | POA: Diagnosis not present

## 2018-05-09 DIAGNOSIS — L819 Disorder of pigmentation, unspecified: Secondary | ICD-10-CM | POA: Diagnosis not present

## 2018-05-09 DIAGNOSIS — Z85828 Personal history of other malignant neoplasm of skin: Secondary | ICD-10-CM | POA: Diagnosis not present

## 2018-05-10 ENCOUNTER — Ambulatory Visit: Payer: Medicare HMO | Admitting: Cardiology

## 2018-05-13 ENCOUNTER — Other Ambulatory Visit: Payer: Self-pay | Admitting: Internal Medicine

## 2018-05-16 DIAGNOSIS — D485 Neoplasm of uncertain behavior of skin: Secondary | ICD-10-CM | POA: Diagnosis not present

## 2018-05-16 DIAGNOSIS — Z85828 Personal history of other malignant neoplasm of skin: Secondary | ICD-10-CM | POA: Diagnosis not present

## 2018-05-16 DIAGNOSIS — L821 Other seborrheic keratosis: Secondary | ICD-10-CM | POA: Diagnosis not present

## 2018-05-16 DIAGNOSIS — L905 Scar conditions and fibrosis of skin: Secondary | ICD-10-CM | POA: Diagnosis not present

## 2018-05-20 ENCOUNTER — Ambulatory Visit: Payer: Medicare HMO | Admitting: Internal Medicine

## 2018-05-23 ENCOUNTER — Other Ambulatory Visit: Payer: Self-pay | Admitting: *Deleted

## 2018-05-23 ENCOUNTER — Telehealth: Payer: Self-pay | Admitting: Internal Medicine

## 2018-05-23 MED ORDER — OMEPRAZOLE 40 MG PO CPDR: 40.0000 mg | DELAYED_RELEASE_CAPSULE | Freq: Every day | ORAL | 2 refills | Status: DC

## 2018-05-23 NOTE — Telephone Encounter (Signed)
Copied from [place] [id number]. Topic: Quick Communication - Rx Refill/Question >> May 23, 2018 12:13 PM Sheppard Coil, [employer] L wrote: Medication: omeprazole (PRILOSEC) 40 MG capsule  Has the patient contacted their pharmacy? Pharmacy calling (Agent: If no, request that the patient contact the pharmacy for the refill.) (Agent: If yes, when and what did the pharmacy advise?)  Preferred Pharmacy (with phone number or street name): [facility], [place] [phone] (Phone) [phone] (Fax)  Agent: Please be advised that RX refills may take up to 3 business days. We ask that you follow-up with your pharmacy.

## 2018-06-03 ENCOUNTER — Telehealth: Payer: Self-pay | Admitting: Cardiology

## 2018-06-03 NOTE — Telephone Encounter (Signed)
Pt wife called to report that the Pt has had chest pain on and off since yesterday.Marland Kitchen He says the pain starts at rest and goes away a few minutes later on its own. He denies any other symptoms such as dizziness, radiation of pain, sob.. I requested they consider going to the ER since our schedules today are limited and I did not want him to wait over the weekend for an appointment especially at his age but he refused unless things get worse and they will call EMS. Otherwise as long as things do not worsen he will see Almyra Deforest PA on Monday 06/06/18. Pt verbalized understanding and agrees.

## 2018-06-03 NOTE — Telephone Encounter (Signed)
° °  Pt c/o of Chest Pain: STAT if CP now or developed within 24 hours  1. Are you having CP right now? NO  2. Are you experiencing any other symptoms (ex. SOB, nausea, vomiting, sweating)? NO  3. How long have you been experiencing CP? 2 DAYS  4. Is your CP continuous or coming and going? COMING AND GOING  5. Have you taken Nitroglycerin? NO ?

## 2018-06-06 ENCOUNTER — Encounter: Payer: Self-pay | Admitting: Physician Assistant

## 2018-06-06 ENCOUNTER — Ambulatory Visit: Payer: Medicare HMO | Admitting: Physician Assistant

## 2018-06-06 VITALS — BP 108/62 | HR 72 | Ht 65.0 in | Wt 163.6 lb

## 2018-06-06 DIAGNOSIS — Z8673 Personal history of transient ischemic attack (TIA), and cerebral infarction without residual deficits: Secondary | ICD-10-CM

## 2018-06-06 DIAGNOSIS — I4819 Other persistent atrial fibrillation: Secondary | ICD-10-CM

## 2018-06-06 DIAGNOSIS — I5032 Chronic diastolic (congestive) heart failure: Secondary | ICD-10-CM

## 2018-06-06 DIAGNOSIS — M316 Other giant cell arteritis: Secondary | ICD-10-CM | POA: Diagnosis not present

## 2018-06-06 DIAGNOSIS — Z720 Tobacco use: Secondary | ICD-10-CM | POA: Diagnosis not present

## 2018-06-06 DIAGNOSIS — I350 Nonrheumatic aortic (valve) stenosis: Secondary | ICD-10-CM | POA: Diagnosis not present

## 2018-06-06 DIAGNOSIS — I10 Essential (primary) hypertension: Secondary | ICD-10-CM | POA: Diagnosis not present

## 2018-06-06 DIAGNOSIS — R079 Chest pain, unspecified: Secondary | ICD-10-CM | POA: Diagnosis not present

## 2018-06-06 NOTE — Patient Instructions (Addendum)
Medication Instructions:   Take Amlodipine 5 mg at bedtime  If you need a refill on your cardiac medications before your next appointment, please call your pharmacy.   Lab work:  Bmet today   Testing/Procedures:   No test ordered  Follow-Up:   Appointment scheduled with Almyra Deforest PA  Thursday 06/23/18 at 2:30 pm

## 2018-06-06 NOTE — Progress Notes (Signed)
Cardiology Office Note    Date:  06/08/2018   ID:  [name], DOB [birth year], MRN [id number]  PCP:  [name], MD  Cardiologist:  Dr. Curt Bears   Chief Complaint  Patient presents with  . Follow-up    seen for Dr. Curt Bears    History of Present Illness:  [name] is a 84 y.o. male who is a WW[employer] veteran with moderate aortic stenosis, mild mitral regurgitation, persistent atrial fibrillation, hypertension, renal cell carcinoma, tobacco abuse, prior stroke, COPD, history of temporal arteritis with left eye blindness and diastolic heart failure.  Patient was diagnosed with atrial fibrillation in June 2019 and was placed on Eliquis.  He had a echocardiogram in May 2019 that showed EF 50 to 55%, normal wall motion, grade 1 DD, moderate aortic stenosis, mild to moderate AI, mild MR, mild to moderate LAE, PA peak pressure 37 mmHg.  Previous carotid ultrasound in February 2019 showed 1 to 39% disease.  He underwent successful outpatient DC cardioversion on 03/21/2018.  Patient was last seen by Dr. Curt Bears on 04/12/2018, he went back into atrial fibrillation at that time.  He was recommended to pursue rate control strategy as he is asymptomatic.  Patient presents today for cardiology office visit.  He denies any change in his functional ability or increasing shortness of breath.  Last week, he had 4 episode of sharp stabbing pain that lasted only a fraction of a second.  This occurred all at rest and did not have them associated with walking around.  The last episode occurred last Thursday and since then, for the past 4 days, patient has been a smooth asymptomatic without any further chest discomfort.  There is no exacerbating factors that contribute to his symptoms.  Overall, he appears to be euvolemic.  He is still smoking about 10 cigarettes/day.  It does not appears he is plan to quit at this time.  Given his age, I was not very insistent on convincing him to quit smoking either.  As far as  the chest pain, it is very atypical, does not occur with exertion.  I would recommend observing his symptom at this time and reassess in 2 weeks.  If his symptoms still recurs, then I was recommend a stress test, otherwise I would like to avoid a stress test in this 84 year old gentleman. Yes EKG   Past Medical History:  Diagnosis Date  . ALLERGIC RHINITIS 09/29/2007  . Aortic stenosis 02/05/2017  . Arthritis    "was in his knees" (01/19/2018)  . BACK PAIN 01/31/2009  . BENIGN PROSTATIC HYPERTROPHY 05/12/2007  . CHEST PAIN 01/31/2008  . CHRONIC OBSTRUCTIVE PULMONARY DISEASE, ACUTE EXACERBATION 08/17/2007  . CONJUNCTIVITIS, ALLERGIC, CHRONIC 03/24/2010  . CONSTIPATION 02/19/2010  . COPD 05/12/2007  . CVA (cerebral vascular accident) ([place]) 05/03/2011  . GERD 02/23/2010  . GOUT 07/04/2010  . Heart murmur   . History of kidney stones    "passed it"  . History of stomach ulcers   . HYPERLIPIDEMIA 05/12/2007  . HYPERTENSION 05/12/2007  . NEPHROLITHIASIS, HX OF   . OBESITY 05/12/2007  . PEPTIC ULCER DISEASE 05/12/2007  . PERIPHERAL VASCULAR DISEASE 05/12/2007  . Personal history of unspecified circulatory disease 05/12/2007  . PLANTAR FASCIITIS, RIGHT 09/27/2008  . RENAL CELL CANCER 05/12/2007  . RENAL INSUFFICIENCY 08/17/2007  . SKIN LESION 11/07/2010  . VERTIGO 09/29/2007  . WRIST PAIN, RIGHT 02/26/2009    Past Surgical History:  Procedure Laterality Date  . ARTERY BIOPSY Bilateral 10/27/2017  Procedure: BILATERAL TEMPORAL ARTERY BIOPSY;  Surgeon: Serafina Mitchell, MD;  Location: [facility];  Service: Vascular;  Laterality: Bilateral;  . CARDIOVERSION N/A 03/21/2018   Procedure: CARDIOVERSION;  Surgeon: Skeet Latch, MD;  Location: [facility];  Service: Cardiovascular;  Laterality: N/A;  . CATARACT EXTRACTION W/ INTRAOCULAR LENS  IMPLANT, BILATERAL Bilateral   . FEMORAL-POPLITEAL BYPASS GRAFT Bilateral   . INNER EAR SURGERY Bilateral    "had it twice in one of his ears; once in the other"  . JOINT  REPLACEMENT    . NEPHRECTOMY    . NEPHROURETERECTOMY    . TOTAL KNEE ARTHROPLASTY Left 06/2007  . TOTAL KNEE ARTHROPLASTY Right     Current Medications: Outpatient Medications Prior to Visit  Medication Sig Dispense Refill  . allopurinol (ZYLOPRIM) 100 MG tablet Take 100 mg by mouth daily.     Marland Kitchen amLODipine (NORVASC) 5 MG tablet TAKE 1 TABLET (5 MG TOTAL) BY MOUTH DAILY. 90 tablet 3  . apixaban (ELIQUIS) 2.5 MG TABS tablet Take 1 tablet (2.5 mg total) by mouth 2 (two) times daily. 60 tablet 3  . cetirizine (ZYRTEC) 10 MG tablet Take 10 mg by mouth daily as needed for allergies.     Marland Kitchen docusate sodium (COLACE) 100 MG capsule Take 200 mg by mouth daily as needed for mild constipation.     . furosemide (LASIX) 20 MG tablet Take 2 tablets (40 mg total) by mouth daily. 30 tablet 0  . meclizine (ANTIVERT) 12.5 MG tablet TAKE 1 TABLET FOUR TIMES DAILY AS NEEDED 180 tablet 0  . mirtazapine (REMERON) 15 MG tablet Take 1 tablet (15 mg total) by mouth at bedtime. 90 tablet 3  . omeprazole (PRILOSEC) 40 MG capsule Take 1 capsule (40 mg total) by mouth daily. 30 capsule 2  . polyethylene glycol (MIRALAX / GLYCOLAX) packet Take 17 g by mouth daily as needed for mild constipation.    . pravastatin (PRAVACHOL) 40 MG tablet Take 40 mg by mouth at bedtime.     Marland Kitchen PREDNISONE PO Take 1 mg by mouth as directed. 6 mg currently, decreasing each month    . predniSONE (DELTASONE) 1 MG tablet Take 8 mg by mouth daily with breakfast.     No facility-administered medications prior to visit.      Allergies:   Sulfonamide derivatives   Social History   Socioeconomic History  . Marital status: Married    Spouse name: Not on file  . Number of children: 4  . Years of education: 5  . Highest education level: Not on file  Occupational History  . Occupation: retired Solicitor: RETIRED  Social Needs  . Financial resource strain: Not hard at all  . Food insecurity:    Worry: Never true    Inability:  Never true  . Transportation needs:    Medical: No    Non-medical: No  Tobacco Use  . Smoking status: Current Every Day Smoker    Packs/day: 0.50    Years: 80.00    Pack years: 40.00    Types: Cigarettes  . Smokeless tobacco: Never Used  Substance and Sexual Activity  . Alcohol use: No  . Drug use: No  . Sexual activity: Not Currently  Lifestyle  . Physical activity:    Days per week: 4 days    Minutes per session: 20 min  . Stress: Only a little  Relationships  . Social connections:    Talks on phone: More than three times a  week    Gets together: More than three times a week    Attends religious service: More than 4 times per year    Active member of club or organization: Yes    Attends meetings of clubs or organizations: More than 4 times per year    Relationship status: Married  Other Topics Concern  . Not on file  Social History Narrative   Married for 71 yrs as of June 2019. Met in a bowling alley.      Family History:  The patient's family history includes Alcohol abuse in his brother; Cancer in his daughter, mother, other, and other; Heart disease in his brother.   ROS:   Please see the history of present illness.    ROS All other systems reviewed and are negative.   PHYSICAL EXAM:   VS:  BP 108/62   Pulse 72   Ht '5\' 5"'$  (1.651 m)   Wt 163 lb 9.6 oz (74.2 kg)   SpO2 93%   BMI 27.22 kg/m    GEN: Well nourished, well developed, in no acute distress  HEENT: normal  Neck: no JVD, carotid bruits, or masses Cardiac: RRR; no murmurs, rubs, or gallops,no edema  Respiratory:  clear to auscultation bilaterally, normal work of breathing GI: soft, nontender, nondistended, + BS MS: no deformity or atrophy  Skin: warm and dry, no rash Neuro:  Alert and Oriented x 3, Strength and sensation are intact Psych: euthymic mood, full affect  Wt Readings from Last 3 Encounters:  06/06/18 163 lb 9.6 oz (74.2 kg)  04/12/18 161 lb 6.4 oz (73.2 kg)  03/25/18 162 lb (73.5  kg)      Studies/Labs Reviewed:   EKG:  EKG is ordered today.  The ekg ordered today demonstrates NSR with PAC and PVC  Recent Labs: 11/27/2017: TSH 1.045 01/19/2018: ALT 27; B Natriuretic Peptide 226.0 03/08/2018: Platelets 344 03/21/2018: Hemoglobin 13.9 06/06/2018: BUN 46; Creatinine, Ser 1.85; Potassium 4.2; Sodium 141   Lipid Panel    Component Value Date/Time   CHOL 101 10/26/2017 0217   TRIG 103 10/26/2017 0217   HDL 37 (L) 10/26/2017 0217   CHOLHDL 2.7 10/26/2017 0217   VLDL 21 10/26/2017 0217   LDLCALC 43 10/26/2017 0217    Additional studies/ records that were reviewed today include:   Echo 01/20/2018 LV EF: 50% -   55% Study Conclusions  - Left ventricle: The cavity size was mildly dilated. Systolic   function was normal. The estimated ejection fraction was in the   range of 50% to 55%. Wall motion was normal; there were no   regional wall motion abnormalities. Doppler parameters are   consistent with abnormal left ventricular relaxation (grade 1   diastolic dysfunction). - Aortic valve: Valve mobility was restricted. There was moderate   stenosis. There was mild to moderate regurgitation. Mean gradient   (S): 22 mm Hg. Valve area (VTI): 1.15 cm^2. Valve area (Vmax):   1.08 cm^2. Valve area (Vmean): 1.12 cm^2. Regurgitation pressure   half-time: 572 ms. - Mitral valve: Mild thickening and calcification, with moderate   involvement of chords. There was mild regurgitation. - Left atrium: The atrium was mildly to moderately dilated. - Pulmonic valve: There was trivial regurgitation. - Pulmonary arteries: PA peak pressure: 37 mm Hg (S).  Impressions:  - The right ventricular systolic pressure was increased consistent   with mild pulmonary hypertension.    ASSESSMENT:    1. Chest pain at rest   2. Nonrheumatic aortic  valve stenosis   3. Chronic diastolic CHF (congestive heart failure) ([place])   4. Essential hypertension   5. Persistent atrial fibrillation    6. Tobacco abuse   7. Temporal arteritis ([place])   8. H/O: CVA (cerebrovascular accident)      PLAN:  In order of problems listed above:  1. Atypical chest pain: For the past 4 weeks, he has been having some intermittent chest pain that only last a fraction of a second.  This does not occur with exertion.  Given his advanced age, I recommend monitoring for now 2. Chronic diastolic heart failure: Appears to be euvolemic on physical exam  3. Aortic valve stenosis: Follow-up with serial echocardiogram.  Moderate on last echocardiogram in May 2019  4. Tobacco abuse: He is not interested to quit smoking at this time  5. Persistent atrial fibrillation: He has converted back to sinus rhythm based on today's EKG.  Continue Eliquis.  On good rate control regimen.  6. Hypertension: He has been having some dizziness after taking his medication, I recommend him to start taking amlodipine and nighttime instead.  7. History of CVA: No recent recurrence    Medication Adjustments/Labs and Tests Ordered: Current medicines are reviewed at length with the patient today.  Concerns regarding medicines are outlined above.  Medication changes, Labs and Tests ordered today are listed in the Patient Instructions below. Patient Instructions  Medication Instructions:   Take Amlodipine 5 mg at bedtime  If you need a refill on your cardiac medications before your next appointment, please call your pharmacy.   Lab work:  Bmet today   Testing/Procedures:   No test ordered  Follow-Up:   Appointment scheduled with Almyra Deforest PA  Thursday 06/23/18 at 2:30 pm       Signed, Almyra Deforest, PA  06/08/2018 5:07 PM    [facility] [address], [place], [place]  [address] Phone: [phone]; Fax: [phone]

## 2018-06-07 LAB — BASIC METABOLIC PANEL
BUN / CREAT RATIO: 25 — AB (ref 10–24)
BUN: 46 mg/dL — AB (ref 10–36)
CHLORIDE: 99 mmol/L (ref 96–106)
CO2: 26 mmol/L (ref 20–29)
Calcium: 9 mg/dL (ref 8.6–10.2)
Creatinine, Ser: 1.85 mg/dL — ABNORMAL HIGH (ref 0.76–1.27)
GFR calc Af Amer: 36 mL/min/{1.73_m2} — ABNORMAL LOW (ref >59)
GFR calc non Af Amer: 31 mL/min/{1.73_m2} — ABNORMAL LOW (ref >59)
GLUCOSE: 110 mg/dL — AB (ref 65–99)
Potassium: 4.2 mmol/L (ref 3.5–5.2)
SODIUM: 141 mmol/L (ref 134–144)

## 2018-06-08 ENCOUNTER — Encounter: Payer: Self-pay | Admitting: Physician Assistant

## 2018-06-09 ENCOUNTER — Ambulatory Visit (INDEPENDENT_AMBULATORY_CARE_PROVIDER_SITE_OTHER): Payer: Medicare HMO | Admitting: *Deleted

## 2018-06-09 DIAGNOSIS — Z23 Encounter for immunization: Secondary | ICD-10-CM | POA: Diagnosis not present

## 2018-06-09 NOTE — Progress Notes (Signed)
Kidney function slightly improved, previously low potassium has normalized.

## 2018-06-13 ENCOUNTER — Telehealth: Payer: Self-pay

## 2018-06-13 NOTE — Telephone Encounter (Signed)
-----   Message from [place], [place] sent at 06/09/2018 11:47 AM EDT ----- Kidney function slightly improved, previously low potassium has normalized.

## 2018-06-13 NOTE — Telephone Encounter (Signed)
Called gave results, patient wife had no questions or concerns.

## 2018-06-14 DIAGNOSIS — N183 Chronic kidney disease, stage 3 (moderate): Secondary | ICD-10-CM | POA: Diagnosis not present

## 2018-06-22 ENCOUNTER — Ambulatory Visit: Payer: Medicare HMO | Admitting: Physician Assistant

## 2018-06-22 DIAGNOSIS — D631 Anemia in chronic kidney disease: Secondary | ICD-10-CM | POA: Diagnosis not present

## 2018-06-22 DIAGNOSIS — I129 Hypertensive chronic kidney disease with stage 1 through stage 4 chronic kidney disease, or unspecified chronic kidney disease: Secondary | ICD-10-CM | POA: Diagnosis not present

## 2018-06-22 DIAGNOSIS — N183 Chronic kidney disease, stage 3 (moderate): Secondary | ICD-10-CM | POA: Diagnosis not present

## 2018-06-22 DIAGNOSIS — N2581 Secondary hyperparathyroidism of renal origin: Secondary | ICD-10-CM | POA: Diagnosis not present

## 2018-06-23 ENCOUNTER — Other Ambulatory Visit: Payer: Self-pay

## 2018-06-23 ENCOUNTER — Ambulatory Visit: Payer: Medicare HMO | Admitting: Physician Assistant

## 2018-06-23 ENCOUNTER — Encounter: Payer: Self-pay | Admitting: Physician Assistant

## 2018-06-23 VITALS — BP 152/62 | HR 57 | Ht 65.0 in | Wt 168.0 lb

## 2018-06-23 DIAGNOSIS — I5032 Chronic diastolic (congestive) heart failure: Secondary | ICD-10-CM | POA: Diagnosis not present

## 2018-06-23 DIAGNOSIS — I350 Nonrheumatic aortic (valve) stenosis: Secondary | ICD-10-CM | POA: Diagnosis not present

## 2018-06-23 DIAGNOSIS — Z8673 Personal history of transient ischemic attack (TIA), and cerebral infarction without residual deficits: Secondary | ICD-10-CM

## 2018-06-23 DIAGNOSIS — R0789 Other chest pain: Secondary | ICD-10-CM

## 2018-06-23 DIAGNOSIS — I10 Essential (primary) hypertension: Secondary | ICD-10-CM | POA: Diagnosis not present

## 2018-06-23 DIAGNOSIS — J449 Chronic obstructive pulmonary disease, unspecified: Secondary | ICD-10-CM

## 2018-06-23 DIAGNOSIS — I4819 Other persistent atrial fibrillation: Secondary | ICD-10-CM | POA: Diagnosis not present

## 2018-06-23 NOTE — Progress Notes (Signed)
Cardiology Office Note    Date:  06/25/2018   ID:  DEBBIE BELLUCCI, DOB 12/05/1924, MRN [id number]  PCP:  [name], MD  Cardiologist:  Dr. Curt Bears  Chief Complaint  Patient presents with  . Follow-up    seen for Dr. Curt Bears    History of Present Illness:  [name] is a 84 y.o. male who is a WWII veteran with moderate aortic stenosis, mild mitral regurgitation, persistent atrial fibrillation, hypertension, renal cell carcinoma, tobacco abuse, prior stroke, COPD, history of temporal arteritis with left eye blindness and diastolic heart failure.  Patient was diagnosed with atrial fibrillation in June 2019 and was placed on Eliquis.  He had a echocardiogram in May 2019 that showed EF 50 to 55%, normal wall motion, grade 1 DD, moderate aortic stenosis, mild to moderate AI, mild MR, mild to moderate LAE, PA peak pressure 37 mmHg.  Previous carotid ultrasound in February 2019 showed 1-39% disease.  He underwent successful outpatient DC cardioversion on 03/21/2018.  Patient was last seen by Dr. Curt Bears on 04/12/2018, he went back into atrial fibrillation at that time.  He was recommended to pursue rate control strategy as he is asymptomatic.  I last saw the patient for atypical chest pain that only lasts seconds at a time.  I recommended observation given his advanced age and the atypical nature of the symptom.  Patient returns today for follow-up.  He has not had any further chest discomfort.  At this time I do not recommend any further work-up.  He has no lower extremity edema, orthopnea or PND.  He can follow-up with Dr. Curt Bears in 4 to 6 months.   Past Medical History:  Diagnosis Date  . ALLERGIC RHINITIS 09/29/2007  . Aortic stenosis 02/05/2017  . Arthritis    "was in his knees" (01/19/2018)  . BACK PAIN 01/31/2009  . BENIGN PROSTATIC HYPERTROPHY 05/12/2007  . CHEST PAIN 01/31/2008  . CHRONIC OBSTRUCTIVE PULMONARY DISEASE, ACUTE EXACERBATION 08/17/2007  . CONJUNCTIVITIS, ALLERGIC,  CHRONIC 03/24/2010  . CONSTIPATION 02/19/2010  . COPD 05/12/2007  . CVA (cerebral vascular accident) ([place]) 05/03/2011  . GERD 02/23/2010  . GOUT 07/04/2010  . Heart murmur   . History of kidney stones    "passed it"  . History of stomach ulcers   . HYPERLIPIDEMIA 05/12/2007  . HYPERTENSION 05/12/2007  . NEPHROLITHIASIS, HX OF   . OBESITY 05/12/2007  . PEPTIC ULCER DISEASE 05/12/2007  . PERIPHERAL VASCULAR DISEASE 05/12/2007  . Personal history of unspecified circulatory disease 05/12/2007  . PLANTAR FASCIITIS, RIGHT 09/27/2008  . RENAL CELL CANCER 05/12/2007  . RENAL INSUFFICIENCY 08/17/2007  . SKIN LESION 11/07/2010  . VERTIGO 09/29/2007  . WRIST PAIN, RIGHT 02/26/2009    Past Surgical History:  Procedure Laterality Date  . ARTERY BIOPSY Bilateral 10/27/2017   Procedure: BILATERAL TEMPORAL ARTERY BIOPSY;  Surgeon: Serafina Mitchell, MD;  Location: [facility];  Service: Vascular;  Laterality: Bilateral;  . CARDIOVERSION N/A 03/21/2018   Procedure: CARDIOVERSION;  Surgeon: Skeet Latch, MD;  Location: [facility];  Service: Cardiovascular;  Laterality: N/A;  . CATARACT EXTRACTION W/ INTRAOCULAR LENS  IMPLANT, BILATERAL Bilateral   . FEMORAL-POPLITEAL BYPASS GRAFT Bilateral   . INNER EAR SURGERY Bilateral    "had it twice in one of his ears; once in the other"  . JOINT REPLACEMENT    . NEPHRECTOMY    . NEPHROURETERECTOMY    . TOTAL KNEE ARTHROPLASTY Left 06/2007  . TOTAL KNEE ARTHROPLASTY Right  Current Medications: Outpatient Medications Prior to Visit  Medication Sig Dispense Refill  . allopurinol (ZYLOPRIM) 100 MG tablet Take 100 mg by mouth daily.     Marland Kitchen amLODipine (NORVASC) 5 MG tablet TAKE 1 TABLET (5 MG TOTAL) BY MOUTH DAILY. 90 tablet 3  . apixaban (ELIQUIS) 2.5 MG TABS tablet Take 1 tablet (2.5 mg total) by mouth 2 (two) times daily. 60 tablet 3  . cetirizine (ZYRTEC) 10 MG tablet Take 10 mg by mouth daily as needed for allergies.     Marland Kitchen docusate sodium (COLACE) 100 MG capsule  Take 200 mg by mouth daily as needed for mild constipation.     . furosemide (LASIX) 20 MG tablet Take 2 tablets (40 mg total) by mouth daily. 30 tablet 0  . meclizine (ANTIVERT) 12.5 MG tablet TAKE 1 TABLET FOUR TIMES DAILY AS NEEDED 180 tablet 0  . mirtazapine (REMERON) 15 MG tablet Take 1 tablet (15 mg total) by mouth at bedtime. 90 tablet 3  . omeprazole (PRILOSEC) 40 MG capsule Take 1 capsule (40 mg total) by mouth daily. 30 capsule 2  . polyethylene glycol (MIRALAX / GLYCOLAX) packet Take 17 g by mouth daily as needed for mild constipation.    . pravastatin (PRAVACHOL) 40 MG tablet Take 40 mg by mouth at bedtime.     Marland Kitchen PREDNISONE PO Take 1 mg by mouth as directed. 6 mg currently, decreasing each month     No facility-administered medications prior to visit.      Allergies:   Sulfonamide derivatives   Social History   Socioeconomic History  . Marital status: Married    Spouse name: Not on file  . Number of children: 4  . Years of education: 5  . Highest education level: Not on file  Occupational History  . Occupation: retired Solicitor: RETIRED  Social Needs  . Financial resource strain: Not hard at all  . Food insecurity:    Worry: Never true    Inability: Never true  . Transportation needs:    Medical: No    Non-medical: No  Tobacco Use  . Smoking status: Current Every Day Smoker    Packs/day: 0.50    Years: 80.00    Pack years: 40.00    Types: Cigarettes  . Smokeless tobacco: Never Used  Substance and Sexual Activity  . Alcohol use: No  . Drug use: No  . Sexual activity: Not Currently  Lifestyle  . Physical activity:    Days per week: 4 days    Minutes per session: 20 min  . Stress: Only a little  Relationships  . Social connections:    Talks on phone: More than three times a week    Gets together: More than three times a week    Attends religious service: More than 4 times per year    Active member of club or organization: Yes    Attends  meetings of clubs or organizations: More than 4 times per year    Relationship status: Married  Other Topics Concern  . Not on file  Social History Narrative   Married for 71 yrs as of June 2019. Met in a bowling alley.      Family History:  The patient's family history includes Alcohol abuse in his brother; Cancer in his daughter, mother, other, and other; Heart disease in his brother.   ROS:   Please see the history of present illness.    ROS All other systems reviewed and  are negative.   PHYSICAL EXAM:   VS:  BP (!) 152/62 (BP Location: Left Arm, Patient Position: Sitting, Cuff Size: Normal)   Pulse (!) 57   Ht _0  (1.651 m)   Wt 168 lb (76.2 kg)   SpO2 97%   BMI 27.96 kg/m    GEN: Well nourished, well developed, in no acute distress  HEENT: normal  Neck: no JVD, carotid bruits, or masses Cardiac: RRR; no murmurs, rubs, or gallops,no edema  Respiratory:  clear to auscultation bilaterally, normal work of breathing GI: soft, nontender, nondistended, + BS MS: no deformity or atrophy  Skin: warm and dry, no rash Neuro:  Alert and Oriented x 3, Strength and sensation are intact Psych: euthymic mood, full affect  Wt Readings from Last 3 Encounters:  06/23/18 168 lb (76.2 kg)  06/06/18 163 lb 9.6 oz (74.2 kg)  04/12/18 161 lb 6.4 oz (73.2 kg)      Studies/Labs Reviewed:   EKG:  EKG is not ordered today.   Recent Labs: 11/27/2017: TSH 1.045 01/19/2018: ALT 27; B Natriuretic Peptide 226.0 03/08/2018: Platelets 344 03/21/2018: Hemoglobin 13.9 06/06/2018: BUN 46; Creatinine, Ser 1.85; Potassium 4.2; Sodium 141   Lipid Panel    Component Value Date/Time   CHOL 101 10/26/2017 0217   TRIG 103 10/26/2017 0217   HDL 37 (L) 10/26/2017 0217   CHOLHDL 2.7 10/26/2017 0217   VLDL 21 10/26/2017 0217   LDLCALC 43 10/26/2017 0217    Additional studies/ records that were reviewed today include:   Echo 01/20/2018 LV EF: 50% -    55%  ------------------------------------------------------------------- Indications:      CHF - 428.0.  ------------------------------------------------------------------- History:   PMH:   Murmur.  Aortic valve disease.  Stroke.  Chronic obstructive pulmonary disease.  PMH:  Aortic Stenosis. Cancer. Renal Insufficiency.  Risk factors:  Hypertension. Dyslipidemia.   ------------------------------------------------------------------- Study Conclusions  - Left ventricle: The cavity size was mildly dilated. Systolic   function was normal. The estimated ejection fraction was in the   range of 50% to 55%. Wall motion was normal; there were no   regional wall motion abnormalities. Doppler parameters are   consistent with abnormal left ventricular relaxation (grade 1   diastolic dysfunction). - Aortic valve: Valve mobility was restricted. There was moderate   stenosis. There was mild to moderate regurgitation. Mean gradient   (S): 22 mm Hg. Valve area (VTI): 1.15 cm^2. Valve area (Vmax):   1.08 cm^2. Valve area (Vmean): 1.12 cm^2. Regurgitation pressure   half-time: 572 ms. - Mitral valve: Mild thickening and calcification, with moderate   involvement of chords. There was mild regurgitation. - Left atrium: The atrium was mildly to moderately dilated. - Pulmonic valve: There was trivial regurgitation. - Pulmonary arteries: PA peak pressure: 37 mm Hg (S).  Impressions:  - The right ventricular systolic pressure was increased consistent   with mild pulmonary hypertension.   ASSESSMENT:    1. Atypical chest pain   2. Nonrheumatic aortic valve stenosis   3. Persistent atrial fibrillation   4. Essential hypertension   5. Chronic obstructive pulmonary disease, unspecified COPD type ([place])   6. Chronic diastolic heart failure ([place])   7. H/O: CVA (cerebrovascular accident)      PLAN:  In order of problems listed above:  1. Chest pain: Patient has not had any further episode of  chest discomfort.  Given his advanced age, I would not recommend any further work-up  2. Chronic diastolic heart failure: Appears to be  euvolemic on physical exam  3. Aortic stenosis: Moderate on previous echocardiogram in May 2019  4. Tobacco abuse: He is not interested to quit smoking at this time  5. Persistent atrial fibrillation: Continue Eliquis.  Heart rate regular on physical exam.  He was noted to be back in sinus rhythm on last EKG  6. History of CVA: No recent recurrence  7. Hypertension: Blood pressure stable.    Medication Adjustments/Labs and Tests Ordered: Current medicines are reviewed at length with the patient today.  Concerns regarding medicines are outlined above.  Medication changes, Labs and Tests ordered today are listed in the Patient Instructions below. Patient Instructions  Medication Instructions:  No Changes If you need a refill on your cardiac medications before your next appointment, please call your pharmacy.   Lab work: None Ordered If you have labs (blood work) drawn today and your tests are completely normal, you will receive your results only by: Marland Kitchen MyChart Message (if you have MyChart) OR . A paper copy in the mail If you have any lab test that is abnormal or we need to change your treatment, we will call you to review the results.  Testing/Procedures: None Ordered  Follow-Up: At [facility], you and your health needs are our priority.  As part of our continuing mission to provide you with exceptional heart care, we have created designated Provider Care Teams.  These Care Teams include your primary Cardiologist (physician) and Advanced Practice Providers (APPs -  Physician Assistants and Nurse Practitioners) who all work together to provide you with the care you need, when you need it. . Follow up with Dr.Camnitz in 4-6 months   Any Other Special Instructions Will Be Listed Below (If Applicable). None      Hilbert Corrigan, [place]   06/25/2018 11:47 PM    [facility] [place], [place], [place]  [address] Phone: [phone]; Fax: [phone]

## 2018-06-23 NOTE — Patient Instructions (Signed)
Medication Instructions:  No Changes If you need a refill on your cardiac medications before your next appointment, please call your pharmacy.   Lab work: None Ordered If you have labs (blood work) drawn today and your tests are completely normal, you will receive your results only by: Marland Kitchen MyChart Message (if you have MyChart) OR . A paper copy in the mail If you have any lab test that is abnormal or we need to change your treatment, we will call you to review the results.  Testing/Procedures: None Ordered  Follow-Up: At [facility], you and your health needs are our priority.  As part of our continuing mission to provide you with exceptional heart care, we have created designated Provider Care Teams.  These Care Teams include your primary Cardiologist (physician) and Advanced Practice Providers (APPs -  Physician Assistants and Nurse Practitioners) who all work together to provide you with the care you need, when you need it. . Follow up with Dr.Camnitz in 4-6 months   Any Other Special Instructions Will Be Listed Below (If Applicable). None

## 2018-06-25 ENCOUNTER — Encounter: Payer: Self-pay | Admitting: Physician Assistant

## 2018-07-15 ENCOUNTER — Ambulatory Visit (INDEPENDENT_AMBULATORY_CARE_PROVIDER_SITE_OTHER): Payer: Medicare HMO | Admitting: Internal Medicine

## 2018-07-15 ENCOUNTER — Encounter: Payer: Self-pay | Admitting: Internal Medicine

## 2018-07-15 VITALS — BP 172/60 | HR 44 | Temp 97.4°F | Ht 65.0 in | Wt 165.8 lb

## 2018-07-15 DIAGNOSIS — N183 Chronic kidney disease, stage 3 unspecified: Secondary | ICD-10-CM

## 2018-07-15 DIAGNOSIS — R001 Bradycardia, unspecified: Secondary | ICD-10-CM | POA: Diagnosis not present

## 2018-07-15 DIAGNOSIS — N179 Acute kidney failure, unspecified: Secondary | ICD-10-CM | POA: Diagnosis not present

## 2018-07-15 DIAGNOSIS — R739 Hyperglycemia, unspecified: Secondary | ICD-10-CM | POA: Diagnosis not present

## 2018-07-15 DIAGNOSIS — N189 Chronic kidney disease, unspecified: Secondary | ICD-10-CM

## 2018-07-15 DIAGNOSIS — I10 Essential (primary) hypertension: Secondary | ICD-10-CM | POA: Diagnosis not present

## 2018-07-15 NOTE — Assessment & Plan Note (Signed)
stable overall by history and exam, recent data reviewed with pt, and pt to continue medical treatment as before,  to f/u any worsening symptoms or concerns  

## 2018-07-15 NOTE — Patient Instructions (Signed)
Please continue all other medications as before, and refills have been done if requested.  Please have the pharmacy call with any other refills you may need.  Please continue your efforts at being more active, low cholesterol diet, and weight control..  Please keep your appointments with your specialists as you may have planned  Please return in 4 months, or sooner if needed

## 2018-07-15 NOTE — Progress Notes (Signed)
Subjective:    Patient ID: [name], male    DOB: 1924/09/05, 84 y.o.   MRN: [id number]  HPI 84 y.o. male who is a WWII veteranwith moderate aortic stenosis, mild mitral regurgitation, persistent atrial fibrillation, hypertension, renal cell carcinoma,tobacco abuse, prior stroke, COPD, history of temporal arteritis with left eye blindness and diastolic heart failure. Patient was diagnosed with atrial fibrillation in June 2019 and was placed on Eliquis. He had a echocardiogram in May 2019 that showed EF 50 to 55%, normal wall motion, grade 1 DD, moderate aortic stenosis, mild to moderate AI, mild MR, mild to moderate LAE, PA peak pressure 37 mmHg. Previous carotid ultrasound in February 2019 showed 1-39% disease. He underwent successful outpatient DC cardioversion on 03/21/2018. Patient was last seen by Dr. Curt Bears on 04/12/2018, he went back into atrial fibrillation at that time. He was recommended to pursue rate control strategy as he is asymptomatic   BP at home usually much better controlled. Now walking with cane, no longer with the wheelchair.  Has chronic cough, no fever and Pt denies chest pain, increased sob or doe, wheezing, orthopnea, PND, increased LE swelling, palpitations, dizziness or syncope. Wt Readings from Last 3 Encounters:  07/15/18 165 lb 12 oz (75.2 kg)  06/23/18 168 lb (76.2 kg)  06/06/18 163 lb 9.6 oz (74.2 kg)  Pt denies new neurological symptoms such as new headache, or facial or extremity weakness or numbness.   Pt denies polydipsia, polyuria.  Now on 5 mg prednisone daily with f/u nov 21 with rheumatology  Has seen renal with stable fxn x yrs per wife per renal.   Past Medical History:  Diagnosis Date  . ALLERGIC RHINITIS 09/29/2007  . Aortic stenosis 02/05/2017  . Arthritis    "was in his knees" (01/19/2018)  . BACK PAIN 01/31/2009  . BENIGN PROSTATIC HYPERTROPHY 05/12/2007  . CHEST PAIN 01/31/2008  . CHRONIC OBSTRUCTIVE PULMONARY DISEASE, ACUTE EXACERBATION  08/17/2007  . CONJUNCTIVITIS, ALLERGIC, CHRONIC 03/24/2010  . CONSTIPATION 02/19/2010  . COPD 05/12/2007  . CVA (cerebral vascular accident) ([place]) 05/03/2011  . GERD 02/23/2010  . GOUT 07/04/2010  . Heart murmur   . History of kidney stones    "passed it"  . History of stomach ulcers   . HYPERLIPIDEMIA 05/12/2007  . HYPERTENSION 05/12/2007  . NEPHROLITHIASIS, HX OF   . OBESITY 05/12/2007  . PEPTIC ULCER DISEASE 05/12/2007  . PERIPHERAL VASCULAR DISEASE 05/12/2007  . Personal history of unspecified circulatory disease 05/12/2007  . PLANTAR FASCIITIS, RIGHT 09/27/2008  . RENAL CELL CANCER 05/12/2007  . RENAL INSUFFICIENCY 08/17/2007  . SKIN LESION 11/07/2010  . VERTIGO 09/29/2007  . WRIST PAIN, RIGHT 02/26/2009   Past Surgical History:  Procedure Laterality Date  . ARTERY BIOPSY Bilateral 10/27/2017   Procedure: BILATERAL TEMPORAL ARTERY BIOPSY;  Surgeon: Serafina Mitchell, MD;  Location: [facility];  Service: Vascular;  Laterality: Bilateral;  . CARDIOVERSION N/A 03/21/2018   Procedure: CARDIOVERSION;  Surgeon: Skeet Latch, MD;  Location: [place];  Service: Cardiovascular;  Laterality: N/A;  . CATARACT EXTRACTION W/ INTRAOCULAR LENS  IMPLANT, BILATERAL Bilateral   . FEMORAL-POPLITEAL BYPASS GRAFT Bilateral   . INNER EAR SURGERY Bilateral    "had it twice in one of his ears; once in the other"  . JOINT REPLACEMENT    . NEPHRECTOMY    . NEPHROURETERECTOMY    . TOTAL KNEE ARTHROPLASTY Left 06/2007  . TOTAL KNEE ARTHROPLASTY Right     reports that he has been smoking cigarettes.  He has a 40.00 pack-year smoking history. He has never used smokeless tobacco. He reports that he does not drink alcohol or use drugs. family history includes Alcohol abuse in his brother; Cancer in his daughter, mother, other, and other; Heart disease in his brother. Allergies  Allergen Reactions  . Sulfonamide Derivatives Rash   Current Outpatient Medications on File Prior to Visit  Medication Sig Dispense  Refill  . allopurinol (ZYLOPRIM) 100 MG tablet Take 100 mg by mouth daily.     Marland Kitchen amLODipine (NORVASC) 5 MG tablet TAKE 1 TABLET (5 MG TOTAL) BY MOUTH DAILY. 90 tablet 3  . apixaban (ELIQUIS) 2.5 MG TABS tablet Take 1 tablet (2.5 mg total) by mouth 2 (two) times daily. 60 tablet 3  . cetirizine (ZYRTEC) 10 MG tablet Take 10 mg by mouth daily as needed for allergies.     Marland Kitchen docusate sodium (COLACE) 100 MG capsule Take 200 mg by mouth daily as needed for mild constipation.     . furosemide (LASIX) 20 MG tablet Take 2 tablets (40 mg total) by mouth daily. 30 tablet 0  . meclizine (ANTIVERT) 12.5 MG tablet TAKE 1 TABLET FOUR TIMES DAILY AS NEEDED 180 tablet 0  . mirtazapine (REMERON) 15 MG tablet Take 1 tablet (15 mg total) by mouth at bedtime. 90 tablet 3  . omeprazole (PRILOSEC) 40 MG capsule Take 1 capsule (40 mg total) by mouth daily. 30 capsule 2  . polyethylene glycol (MIRALAX / GLYCOLAX) packet Take 17 g by mouth daily as needed for mild constipation.    . pravastatin (PRAVACHOL) 40 MG tablet Take 40 mg by mouth at bedtime.     . predniSONE (DELTASONE) 5 MG tablet TK 1 T PO QD  2   No current facility-administered medications on file prior to visit.    Review of Systems  Constitutional: Negative for other unusual diaphoresis or sweats HENT: Negative for ear discharge or swelling Eyes: Negative for other worsening visual disturbances Respiratory: Negative for stridor or other swelling  Gastrointestinal: Negative for worsening distension or other blood Genitourinary: Negative for retention or other urinary change Musculoskeletal: Negative for other MSK pain or swelling Skin: Negative for color change or other new lesions Neurological: Negative for worsening tremors and other numbness  Psychiatric/Behavioral: Negative for worsening agitation or other fatigue All other system neg per pt    Objective:   Physical Exam BP (!) 172/60 (BP Location: Left Arm, Patient Position: Sitting, Cuff  Size: Normal)   Pulse (!) 44   Temp (!) 97.4 F (36.3 C) (Oral)   Ht 5\' 5"  (1.651 m)   Wt 165 lb 12 oz (75.2 kg)   SpO2 95%   BMI 27.58 kg/m  VS noted,  Constitutional: Pt appears in NAD HENT: Head: NCAT.  Right Ear: External ear normal.  Left Ear: External ear normal.  Eyes: . Pupils are equal, round, and reactive to light. Conjunctivae and EOM are normal Nose: without d/c or deformity Neck: Neck supple. Gross normal ROM Cardiovascular: Normal rate and irregular rhythm, mild low   Pulmonary/Chest: Effort normal and breath sounds without rales or wheezing.  Abd:  Soft, NT, ND, + BS, no organomegaly Neurological: Pt is alert. At baseline orientation, motor grossly intact Skin: Skin is warm. No rashes, other new lesions, no LE edema Psychiatric: Pt behavior is normal without agitation , unhappy, ? Mild depressed affect No other exam findings  Lab Results  Component Value Date   WBC 10.6 03/08/2018   HGB 13.9 03/21/2018  HCT 41.0 03/21/2018   PLT 344 03/08/2018   GLUCOSE 110 (H) 06/06/2018   CHOL 101 10/26/2017   TRIG 103 10/26/2017   HDL 37 (L) 10/26/2017   LDLCALC 43 10/26/2017   ALT 27 01/19/2018   AST 18 01/19/2018   NA 141 06/06/2018   K 4.2 06/06/2018   CL 99 06/06/2018   CREATININE 1.85 (H) 06/06/2018   BUN 46 (H) 06/06/2018   CO2 26 06/06/2018   TSH 1.045 11/27/2017   PSA 0.73 10/18/2017   INR 1.21 10/27/2017   HGBA1C 5.5 10/26/2017        Assessment & Plan:

## 2018-07-15 NOTE — Assessment & Plan Note (Signed)
Apparently asympt today, not med related, tolerates ok, to f/u card as planned

## 2018-07-21 DIAGNOSIS — M7989 Other specified soft tissue disorders: Secondary | ICD-10-CM | POA: Diagnosis not present

## 2018-07-21 DIAGNOSIS — T887XXA Unspecified adverse effect of drug or medicament, initial encounter: Secondary | ICD-10-CM | POA: Diagnosis not present

## 2018-07-21 DIAGNOSIS — R05 Cough: Secondary | ICD-10-CM | POA: Diagnosis not present

## 2018-07-21 DIAGNOSIS — N183 Chronic kidney disease, stage 3 (moderate): Secondary | ICD-10-CM | POA: Diagnosis not present

## 2018-07-21 DIAGNOSIS — R9389 Abnormal findings on diagnostic imaging of other specified body structures: Secondary | ICD-10-CM | POA: Diagnosis not present

## 2018-07-21 DIAGNOSIS — J449 Chronic obstructive pulmonary disease, unspecified: Secondary | ICD-10-CM | POA: Diagnosis not present

## 2018-07-21 DIAGNOSIS — M316 Other giant cell arteritis: Secondary | ICD-10-CM | POA: Diagnosis not present

## 2018-07-21 DIAGNOSIS — R0602 Shortness of breath: Secondary | ICD-10-CM | POA: Diagnosis not present

## 2018-07-21 DIAGNOSIS — R600 Localized edema: Secondary | ICD-10-CM | POA: Diagnosis not present

## 2018-07-28 ENCOUNTER — Other Ambulatory Visit: Payer: Self-pay | Admitting: Internal Medicine

## 2018-08-26 ENCOUNTER — Other Ambulatory Visit: Payer: Self-pay | Admitting: Cardiology

## 2018-08-26 NOTE — Telephone Encounter (Signed)
Eliquis 2.5mg  refill request received; pt is 84 yrs old, wt-75.2kg, Crea-1.85 on 06/06/18, last seen by [facility] on 06/23/18; will send in a refill to requested pharmacy.

## 2018-09-15 DIAGNOSIS — L821 Other seborrheic keratosis: Secondary | ICD-10-CM | POA: Diagnosis not present

## 2018-09-15 DIAGNOSIS — L989 Disorder of the skin and subcutaneous tissue, unspecified: Secondary | ICD-10-CM | POA: Diagnosis not present

## 2018-09-15 DIAGNOSIS — D225 Melanocytic nevi of trunk: Secondary | ICD-10-CM | POA: Diagnosis not present

## 2018-09-15 DIAGNOSIS — Z85828 Personal history of other malignant neoplasm of skin: Secondary | ICD-10-CM | POA: Diagnosis not present

## 2018-09-15 DIAGNOSIS — D1801 Hemangioma of skin and subcutaneous tissue: Secondary | ICD-10-CM | POA: Diagnosis not present

## 2018-09-15 DIAGNOSIS — D485 Neoplasm of uncertain behavior of skin: Secondary | ICD-10-CM | POA: Diagnosis not present

## 2018-09-20 ENCOUNTER — Encounter: Payer: Self-pay | Admitting: Cardiology

## 2018-10-03 ENCOUNTER — Other Ambulatory Visit: Payer: Self-pay | Admitting: Internal Medicine

## 2018-10-05 ENCOUNTER — Encounter: Payer: Self-pay | Admitting: Cardiology

## 2018-10-05 ENCOUNTER — Ambulatory Visit: Payer: Medicare HMO | Admitting: Cardiology

## 2018-10-05 VITALS — BP 132/76 | HR 83 | Ht 65.0 in | Wt 164.0 lb

## 2018-10-05 DIAGNOSIS — R079 Chest pain, unspecified: Secondary | ICD-10-CM

## 2018-10-05 DIAGNOSIS — I4819 Other persistent atrial fibrillation: Secondary | ICD-10-CM

## 2018-10-05 NOTE — Patient Instructions (Signed)
Medication Instructions:  Your physician recommends that you continue on your current medications as directed. Please refer to the Current Medication list given to you today.  * If you need a refill on your cardiac medications before your next appointment, please call your pharmacy.   Labwork: None ordered *We will only notify you of abnormal results, otherwise continue current treatment plan.  Testing/Procedures: None ordered  Follow-Up: Your physician wants you to follow-up in: 6 months with Dr. Camnitz.  You will receive a reminder letter in the mail two months in advance. If you don't receive a letter, please call our office to schedule the follow-up appointment.   Thank you for choosing [facility]!!   Priyana Mccarey, RN [phone]       

## 2018-10-05 NOTE — Progress Notes (Signed)
Electrophysiology Office Note   Date:  10/05/2018   ID:  [name], DOB [birth year], MRN [id number]  PCP:  [name], MD Primary Electrophysiologist:  [name], MD    No chief complaint on file.    History of Present Illness: [name] is a 85 y.o. male who is being seen today for the evaluation of heart murmur at the request of [name], MD. Presenting today for electrophysiology evaluation. He is found to have a murmur by his primary physician who ordered an echocardiogram which showed moderate aortic stenosis as well as mild mitral regurgitation.  Today, denies symptoms of palpitations, chest pain, shortness of breath, orthopnea, PND, lower extremity edema, claudication, dizziness, presyncope, syncope, bleeding, or neurologic sequela. The patient is tolerating medications without difficulties.  He feels well today.  About 2 weeks ago, when he was getting out of bed, he noticed severe pain in his left chest.  He grabbed his chest.  The severe pain lasted a few seconds and then he had a more generalized pain over the entirety of his thorax.  He is concerned that he may have had an MI.  Of note the patient is a World War II veteran.  Past Medical History:  Diagnosis Date  . ALLERGIC RHINITIS 09/29/2007  . Aortic stenosis 02/05/2017  . Arthritis    "was in his knees" (01/19/2018)  . BACK PAIN 01/31/2009  . BENIGN PROSTATIC HYPERTROPHY 05/12/2007  . CHEST PAIN 01/31/2008  . CHRONIC OBSTRUCTIVE PULMONARY DISEASE, ACUTE EXACERBATION 08/17/2007  . CONJUNCTIVITIS, ALLERGIC, CHRONIC 03/24/2010  . CONSTIPATION 02/19/2010  . COPD 05/12/2007  . CVA (cerebral vascular accident) ([place]) 05/03/2011  . GERD 02/23/2010  . GOUT 07/04/2010  . Heart murmur   . History of kidney stones    "passed it"  . History of stomach ulcers   . HYPERLIPIDEMIA 05/12/2007  . HYPERTENSION 05/12/2007  . NEPHROLITHIASIS, HX OF   . OBESITY 05/12/2007  . PEPTIC ULCER DISEASE 05/12/2007  .  PERIPHERAL VASCULAR DISEASE 05/12/2007  . Personal history of unspecified circulatory disease 05/12/2007  . PLANTAR FASCIITIS, RIGHT 09/27/2008  . RENAL CELL CANCER 05/12/2007  . RENAL INSUFFICIENCY 08/17/2007  . SKIN LESION 11/07/2010  . VERTIGO 09/29/2007  . WRIST PAIN, RIGHT 02/26/2009   Past Surgical History:  Procedure Laterality Date  . ARTERY BIOPSY Bilateral 10/27/2017   Procedure: BILATERAL TEMPORAL ARTERY BIOPSY;  Surgeon: Serafina Mitchell, MD;  Location: [facility];  Service: Vascular;  Laterality: Bilateral;  . CARDIOVERSION N/A 03/21/2018   Procedure: CARDIOVERSION;  Surgeon: Skeet Latch, MD;  Location: [facility];  Service: Cardiovascular;  Laterality: N/A;  . CATARACT EXTRACTION W/ INTRAOCULAR LENS  IMPLANT, BILATERAL Bilateral   . FEMORAL-POPLITEAL BYPASS GRAFT Bilateral   . INNER EAR SURGERY Bilateral    "had it twice in one of his ears; once in the other"  . JOINT REPLACEMENT    . NEPHRECTOMY    . NEPHROURETERECTOMY    . TOTAL KNEE ARTHROPLASTY Left 06/2007  . TOTAL KNEE ARTHROPLASTY Right      Current Outpatient Medications  Medication Sig Dispense Refill  . allopurinol (ZYLOPRIM) 100 MG tablet Take 100 mg by mouth daily.     Marland Kitchen amLODipine (NORVASC) 5 MG tablet TAKE 1 TABLET (5 MG TOTAL) BY MOUTH DAILY. 90 tablet 3  . cetirizine (ZYRTEC) 10 MG tablet Take 10 mg by mouth daily as needed for allergies.     Marland Kitchen docusate sodium (COLACE) 100 MG capsule Take 200 mg  by mouth daily as needed for mild constipation.     Marland Kitchen ELIQUIS 2.5 MG TABS tablet TAKE 1 TABLET(2.5 MG) BY MOUTH TWICE DAILY 60 tablet 10  . furosemide (LASIX) 20 MG tablet Take 2 tablets (40 mg total) by mouth daily. 30 tablet 0  . meclizine (ANTIVERT) 12.5 MG tablet TAKE 1 TABLET FOUR TIMES DAILY AS NEEDED 180 tablet 0  . mirtazapine (REMERON) 15 MG tablet Take 1 tablet (15 mg total) by mouth at bedtime. 90 tablet 3  . omeprazole (PRILOSEC) 40 MG capsule TAKE 1 CAPSULE EVERY DAY 90 capsule 0  . polyethylene glycol  (MIRALAX / GLYCOLAX) packet Take 17 g by mouth daily as needed for mild constipation.    . pravastatin (PRAVACHOL) 40 MG tablet Take 40 mg by mouth at bedtime.     . predniSONE (DELTASONE) 5 MG tablet TK 1 T PO QD  2   No current facility-administered medications for this visit.     Allergies:   Sulfonamide derivatives   Social History:  The patient  reports that he has been smoking cigarettes. He has a 40.00 pack-year smoking history. He has never used smokeless tobacco. He reports that he does not drink alcohol or use drugs.   Family History:  The patient's family history includes Alcohol abuse in his brother; Cancer in his daughter, mother, and other family members; Heart disease in his brother.   ROS:  Please see the history of present illness.   Otherwise, review of systems is positive for cough, constipation, depression, back pain, muscle pain, balance problems, easy bruising.   All other systems are reviewed and negative.   PHYSICAL EXAM: VS:  BP 132/76   Pulse 83   Ht 5\' 5"  (1.651 m)   Wt 164 lb (74.4 kg)   SpO2 97%   BMI 27.29 kg/m  , BMI Body mass index is 27.29 kg/m. GEN: Well nourished, well developed, in no acute distress  HEENT: normal  Neck: no JVD, carotid bruits, or masses Cardiac: RRR; no murmurs, rubs, or gallops,no edema  Respiratory:  clear to auscultation bilaterally, normal work of breathing GI: soft, nontender, nondistended, + BS MS: no deformity or atrophy  Skin: warm and dry Neuro:  Strength and sensation are intact Psych: euthymic mood, full affect  EKG:  EKG is ordered today. Personal review of the ekg ordered shows sinus rhythm, PACs, right bundle branch block, PVCs  Recent Labs: 11/27/2017: TSH 1.045 01/19/2018: ALT 27; B Natriuretic Peptide 226.0 03/08/2018: Platelets 344 03/21/2018: Hemoglobin 13.9 06/06/2018: BUN 46; Creatinine, Ser 1.85; Potassium 4.2; Sodium 141    Lipid Panel     Component Value Date/Time   CHOL 101 10/26/2017 0217    TRIG 103 10/26/2017 0217   HDL 37 (L) 10/26/2017 0217   CHOLHDL 2.7 10/26/2017 0217   VLDL 21 10/26/2017 0217   LDLCALC 43 10/26/2017 0217     Wt Readings from Last 3 Encounters:  10/05/18 164 lb (74.4 kg)  07/15/18 165 lb 12 oz (75.2 kg)  06/23/18 168 lb (76.2 kg)      Other studies Reviewed: Additional studies/ records that were reviewed today include: TTE 01/20/18 Review of the above records today demonstrates:  - Left ventricle: The cavity size was mildly dilated. Systolic   function was normal. The estimated ejection fraction was in the   range of 50% to 55%. Wall motion was normal; there were no   regional wall motion abnormalities. Doppler parameters are   consistent with abnormal left ventricular  relaxation (grade 1   diastolic dysfunction). - Aortic valve: Valve mobility was restricted. There was moderate   stenosis. There was mild to moderate regurgitation. Mean gradient   (S): 22 mm Hg. Valve area (VTI): 1.15 cm^2. Valve area (Vmax):   1.08 cm^2. Valve area (Vmean): 1.12 cm^2. Regurgitation pressure   half-time: 572 ms. - Mitral valve: Mild thickening and calcification, with moderate   involvement of chords. There was mild regurgitation. - Left atrium: The atrium was mildly to moderately dilated. - Pulmonic valve: There was trivial regurgitation. - Pulmonary arteries: PA peak pressure: 37 mm Hg (S).   ASSESSMENT AND PLAN:  1.  Moderate aortic stenosis: Most recent echo shows moderate level stenosis.  Unlikely because of heart failure exacerbations.  No indications for valve replacement at this time.  2. Mild Mitral regurgitation: Mild on most recent echo.  No changes.  3. Hypertension: Currently well controlled.  4. Tobacco abuse: No interest in quitting  5.  Diastolic heart failure: He has no signs of volume overload  6.  Persistent atrial fibrillation: Currently on Eliquis.  Planning for a rate control strategy.  He is currently well rate controlled.  7.   Chest pain: Unclear to the cause of his chest pain.  His EKG is relatively unchanged from prior.  We Alvis Pulcini make no further changes.  This patients CHA2DS2-VASc Score and unadjusted Ischemic Stroke Rate (% per year) is equal to 3.2 % stroke rate/year from a score of 3  Above score calculated as 1 point each if present [CHF, HTN, DM, Vascular=MI/PAD/Aortic Plaque, Age if 65-74, or Male] Above score calculated as 2 points each if present [Age > 75, or Stroke/TIA/TE]   Current medicines are reviewed at length with the patient today.   The patient does not have concerns regarding his medicines.  The following changes were made today: none  Labs/ tests ordered today include:  Orders Placed This Encounter  Procedures  . EKG 12-Lead     Disposition:   FU with Lyzette Reinhardt 6 months  Signed, Norris Bodley Meredith Leeds, MD  10/05/2018 4:41 PM     [address] [place] [address] [phone] (office) [phone] (fax)

## 2018-10-24 DIAGNOSIS — H524 Presbyopia: Secondary | ICD-10-CM | POA: Diagnosis not present

## 2018-10-24 DIAGNOSIS — H3530 Unspecified macular degeneration: Secondary | ICD-10-CM | POA: Diagnosis not present

## 2018-10-24 DIAGNOSIS — Z961 Presence of intraocular lens: Secondary | ICD-10-CM | POA: Diagnosis not present

## 2018-10-24 DIAGNOSIS — H52203 Unspecified astigmatism, bilateral: Secondary | ICD-10-CM | POA: Diagnosis not present

## 2018-10-24 DIAGNOSIS — H5213 Myopia, bilateral: Secondary | ICD-10-CM | POA: Diagnosis not present

## 2018-10-24 DIAGNOSIS — H47293 Other optic atrophy, bilateral: Secondary | ICD-10-CM | POA: Diagnosis not present

## 2018-10-25 DIAGNOSIS — R5383 Other fatigue: Secondary | ICD-10-CM | POA: Diagnosis not present

## 2018-10-25 DIAGNOSIS — T887XXA Unspecified adverse effect of drug or medicament, initial encounter: Secondary | ICD-10-CM | POA: Diagnosis not present

## 2018-10-25 DIAGNOSIS — N183 Chronic kidney disease, stage 3 (moderate): Secondary | ICD-10-CM | POA: Diagnosis not present

## 2018-10-25 DIAGNOSIS — R05 Cough: Secondary | ICD-10-CM | POA: Diagnosis not present

## 2018-10-25 DIAGNOSIS — R600 Localized edema: Secondary | ICD-10-CM | POA: Diagnosis not present

## 2018-10-25 DIAGNOSIS — M316 Other giant cell arteritis: Secondary | ICD-10-CM | POA: Diagnosis not present

## 2018-10-25 DIAGNOSIS — R0602 Shortness of breath: Secondary | ICD-10-CM | POA: Diagnosis not present

## 2018-10-25 DIAGNOSIS — E663 Overweight: Secondary | ICD-10-CM | POA: Diagnosis not present

## 2018-10-25 DIAGNOSIS — J449 Chronic obstructive pulmonary disease, unspecified: Secondary | ICD-10-CM | POA: Diagnosis not present

## 2018-10-28 ENCOUNTER — Encounter: Payer: Self-pay | Admitting: Internal Medicine

## 2018-10-28 ENCOUNTER — Ambulatory Visit (INDEPENDENT_AMBULATORY_CARE_PROVIDER_SITE_OTHER)
Admission: RE | Admit: 2018-10-28 | Discharge: 2018-10-28 | Disposition: A | Discharge status: Home / Self Care | Payer: Medicare HMO | Source: Ambulatory Visit | Attending: Internal Medicine | Admitting: Internal Medicine

## 2018-10-28 ENCOUNTER — Ambulatory Visit (INDEPENDENT_AMBULATORY_CARE_PROVIDER_SITE_OTHER): Payer: Medicare HMO | Admitting: Internal Medicine

## 2018-10-28 ENCOUNTER — Ambulatory Visit: Payer: Medicare HMO | Admitting: Internal Medicine

## 2018-10-28 VITALS — BP 126/84 | HR 92 | Temp 97.8°F | Ht 65.0 in

## 2018-10-28 DIAGNOSIS — R739 Hyperglycemia, unspecified: Secondary | ICD-10-CM | POA: Diagnosis not present

## 2018-10-28 DIAGNOSIS — R079 Chest pain, unspecified: Secondary | ICD-10-CM | POA: Diagnosis not present

## 2018-10-28 DIAGNOSIS — Z905 Acquired absence of kidney: Secondary | ICD-10-CM | POA: Insufficient documentation

## 2018-10-28 DIAGNOSIS — I10 Essential (primary) hypertension: Secondary | ICD-10-CM | POA: Diagnosis not present

## 2018-10-28 DIAGNOSIS — R0781 Pleurodynia: Secondary | ICD-10-CM

## 2018-10-28 MED ORDER — TRAMADOL HCL ER 100 MG PO TB24: 100.0000 mg | ORAL_TABLET | Freq: Every day | ORAL | 0 refills | Status: DC

## 2018-10-28 NOTE — Assessment & Plan Note (Signed)
stable overall by history and exam, recent data reviewed with pt, and pt to continue medical treatment as before,  to f/u any worsening symptoms or concerns  

## 2018-10-28 NOTE — Patient Instructions (Signed)
Please take all new medication as prescribed - the pain medication  Please call in 1 week if you need a refill  Please take Miralax daily to avoid constipation  Please continue all other medications as before, and refills have been done if requested.  Please have the pharmacy call with any other refills you may need.  Please continue your efforts at being more active, low cholesterol diet, and weight control.  Please keep your appointments with your specialists as you may have planned  Please go to the XRAY Department in the Basement (go straight as you get off the elevator) for the x-ray testing  You will be contacted by phone if any changes need to be made immediately.  Otherwise, you will receive a letter about your results with an explanation, but please check with MyChart first.  Please remember to sign up for MyChart if you have not done so, as this will be important to you in the future with finding out test results, communicating by private email, and scheduling acute appointments online when needed.

## 2018-10-28 NOTE — Progress Notes (Signed)
Subjective:    Patient ID: [name], male    DOB: 08-02-1925, 85 y.o.   MRN: [id number]  HPI   Here with family, c/o 4 days onset left lower sided pleuritic chest pain , sharp, 8/10, constant but worse to turn or twist at the waist, cant find a good way to sleep.  Tylenol only mid improvement.  Nothing else makes better or worse. Started after trying to fall x 2 a few days ago, where he was able to catch on and not hit the floor. Pt denies increased sob or doe, wheezing, orthopnea, PND, increased LE swelling, palpitations, dizziness or syncope.  Pt denies new neurological symptoms such as new headache, or facial or extremity weakness or numbness   Pt denies polydipsia, polyuria Past Medical History:  Diagnosis Date  . ALLERGIC RHINITIS 09/29/2007  . Aortic stenosis 02/05/2017  . Arthritis    "was in his knees" (01/19/2018)  . BACK PAIN 01/31/2009  . BENIGN PROSTATIC HYPERTROPHY 05/12/2007  . CHEST PAIN 01/31/2008  . CHRONIC OBSTRUCTIVE PULMONARY DISEASE, ACUTE EXACERBATION 08/17/2007  . CONJUNCTIVITIS, ALLERGIC, CHRONIC 03/24/2010  . CONSTIPATION 02/19/2010  . COPD 05/12/2007  . CVA (cerebral vascular accident) ([place]) 05/03/2011  . GERD 02/23/2010  . GOUT 07/04/2010  . Heart murmur   . History of kidney stones    "passed it"  . History of stomach ulcers   . HYPERLIPIDEMIA 05/12/2007  . HYPERTENSION 05/12/2007  . NEPHROLITHIASIS, HX OF   . OBESITY 05/12/2007  . PEPTIC ULCER DISEASE 05/12/2007  . PERIPHERAL VASCULAR DISEASE 05/12/2007  . Personal history of unspecified circulatory disease 05/12/2007  . PLANTAR FASCIITIS, RIGHT 09/27/2008  . RENAL CELL CANCER 05/12/2007  . RENAL INSUFFICIENCY 08/17/2007  . SKIN LESION 11/07/2010  . VERTIGO 09/29/2007  . WRIST PAIN, RIGHT 02/26/2009   Past Surgical History:  Procedure Laterality Date  . ARTERY BIOPSY Bilateral 10/27/2017   Procedure: BILATERAL TEMPORAL ARTERY BIOPSY;  Surgeon: Serafina Mitchell, MD;  Location: [facility];  Service: Vascular;  Laterality:  Bilateral;  . CARDIOVERSION N/A 03/21/2018   Procedure: CARDIOVERSION;  Surgeon: Skeet Latch, MD;  Location: [facility];  Service: Cardiovascular;  Laterality: N/A;  . CATARACT EXTRACTION W/ INTRAOCULAR LENS  IMPLANT, BILATERAL Bilateral   . FEMORAL-POPLITEAL BYPASS GRAFT Bilateral   . INNER EAR SURGERY Bilateral    "had it twice in one of his ears; once in the other"  . JOINT REPLACEMENT    . NEPHRECTOMY    . NEPHROURETERECTOMY    . TOTAL KNEE ARTHROPLASTY Left 06/2007  . TOTAL KNEE ARTHROPLASTY Right     reports that he has been smoking cigarettes. He has a 40.00 pack-year smoking history. He has never used smokeless tobacco. He reports that he does not drink alcohol or use drugs. family history includes Alcohol abuse in his brother; Cancer in his daughter, mother, and other family members; Heart disease in his brother. Allergies  Allergen Reactions  . Sulfonamide Derivatives Rash   Current Outpatient Medications on File Prior to Visit  Medication Sig Dispense Refill  . allopurinol (ZYLOPRIM) 100 MG tablet Take 100 mg by mouth daily.     Marland Kitchen amLODipine (NORVASC) 5 MG tablet TAKE 1 TABLET (5 MG TOTAL) BY MOUTH DAILY. 90 tablet 3  . cetirizine (ZYRTEC) 10 MG tablet Take 10 mg by mouth daily as needed for allergies.     Marland Kitchen docusate sodium (COLACE) 100 MG capsule Take 200 mg by mouth daily as needed for mild constipation.     Marland Kitchen  ELIQUIS 2.5 MG TABS tablet TAKE 1 TABLET(2.5 MG) BY MOUTH TWICE DAILY 60 tablet 10  . furosemide (LASIX) 20 MG tablet Take 2 tablets (40 mg total) by mouth daily. 30 tablet 0  . meclizine (ANTIVERT) 12.5 MG tablet TAKE 1 TABLET FOUR TIMES DAILY AS NEEDED 180 tablet 0  . mirtazapine (REMERON) 15 MG tablet Take 1 tablet (15 mg total) by mouth at bedtime. 90 tablet 3  . omeprazole (PRILOSEC) 40 MG capsule TAKE 1 CAPSULE EVERY DAY 90 capsule 0  . polyethylene glycol (MIRALAX / GLYCOLAX) packet Take 17 g by mouth daily as needed for mild constipation.    .  pravastatin (PRAVACHOL) 40 MG tablet Take 40 mg by mouth at bedtime.     . predniSONE (DELTASONE) 5 MG tablet TK 1 T PO QD  2   No current facility-administered medications on file prior to visit.    Review of Systems  Constitutional: Negative for other unusual diaphoresis or sweats HENT: Negative for ear discharge or swelling Eyes: Negative for other worsening visual disturbances Respiratory: Negative for stridor or other swelling  Gastrointestinal: Negative for worsening distension or other blood Genitourinary: Negative for retention or other urinary change Musculoskeletal: Negative for other MSK pain or swelling Skin: Negative for color change or other new lesions Neurological: Negative for worsening tremors and other numbness  Psychiatric/Behavioral: Negative for worsening agitation or other fatigue All other system neg per pt    Objective:   Physical Exam BP 126/84   Pulse 92   Temp 97.8 F (36.6 C) (Oral)   Ht 5\' 5"  (1.651 m)   SpO2 95%   BMI 27.29 kg/m  VS noted, grimaces in pain with movement in the wheelchair Constitutional: Pt appears in NAD HENT: Head: NCAT.  Right Ear: External ear normal.  Left Ear: External ear normal.  Eyes: . Pupils are equal, round, and reactive to light. Conjunctivae and EOM are normal Nose: without d/c or deformity Neck: Neck supple. Gross normal ROM Cardiovascular: Normal rate and regular rhythm.   Pulmonary/Chest: Effort normal and breath sounds without rales or wheezing. ; + tender to left lateral and anterior lower chest, without rash swelling or bruising Abd:  Soft, NT, ND, + BS, no organomegaly Neurological: Pt is alert. At baseline orientation, motor grossly intact Skin: Skin is warm. No rashes, other new lesions, no LE edema Psychiatric: Pt behavior is normal without agitation  No other exam findings Lab Results  Component Value Date   WBC 10.6 03/08/2018   HGB 13.9 03/21/2018   HCT 41.0 03/21/2018   PLT 344 03/08/2018    GLUCOSE 110 (H) 06/06/2018   CHOL 101 10/26/2017   TRIG 103 10/26/2017   HDL 37 (L) 10/26/2017   LDLCALC 43 10/26/2017   ALT 27 01/19/2018   AST 18 01/19/2018   NA 141 06/06/2018   K 4.2 06/06/2018   CL 99 06/06/2018   CREATININE 1.85 (H) 06/06/2018   BUN 46 (H) 06/06/2018   CO2 26 06/06/2018   TSH 1.045 11/27/2017   PSA 0.73 10/18/2017   INR 1.21 10/27/2017   HGBA1C 5.5 10/26/2017       Assessment & Plan:

## 2018-10-28 NOTE — Assessment & Plan Note (Signed)
Mod to severe per pt, for tramadol ER 100 qd prn, rib films

## 2018-10-28 NOTE — Assessment & Plan Note (Signed)
Atypical, suspect related to MSK strain with 2 recent near falls, for cxr,  to f/u any worsening symptoms or concerns

## 2018-10-29 ENCOUNTER — Other Ambulatory Visit: Payer: Self-pay | Admitting: Internal Medicine

## 2018-10-31 ENCOUNTER — Encounter: Payer: Self-pay | Admitting: Internal Medicine

## 2018-10-31 ENCOUNTER — Telehealth: Payer: Self-pay | Admitting: Internal Medicine

## 2018-10-31 MED ORDER — HYDROCODONE-ACETAMINOPHEN 5-325 MG PO TABS: 1.0000 | ORAL_TABLET | Freq: Four times a day (QID) | ORAL | 0 refills | Status: DC | PRN

## 2018-10-31 NOTE — Telephone Encounter (Signed)
I looked in the patients chart and I don't see any results yet. It only said "ordered". Please advise once results comes in.

## 2018-10-31 NOTE — Telephone Encounter (Signed)
Copied from [place] [id number]. Topic: General - Inquiry >> Oct 31, 2018  9:13 AM Margot Ables wrote: Reason for CRM: Pt wife called to f/u on xray results. She states that if pt is laying in a certain position he is ok but if he moves he hollers out in pain. She notes the tramadol is not helping with pain. She is hoping results reveal what is accounting for the pain. Please call back.

## 2018-10-31 NOTE — Addendum Note (Signed)
Addended by: [name] on: 10/31/2018 12:47 PM   Modules accepted: Orders

## 2018-10-31 NOTE — Telephone Encounter (Signed)
The patient is not signed up for mychart, so I was unable to forward the results, and letter will be sent.   The xray did show a fracture of the left 7th rib, which will need at least 6 wks to heal  I will send hydrocodone 5 325 prn, but remember this is for acute pain, so the [place] law is such that we can only send 1 wk to start;  If still needs after 1 wk, please call for refill

## 2018-10-31 NOTE — Telephone Encounter (Signed)
Pt's wife has been informed and expressed understanding. 

## 2018-11-15 ENCOUNTER — Encounter: Payer: Self-pay | Admitting: Internal Medicine

## 2018-11-15 ENCOUNTER — Other Ambulatory Visit: Payer: Self-pay | Admitting: Internal Medicine

## 2018-11-15 ENCOUNTER — Other Ambulatory Visit: Payer: Self-pay

## 2018-11-15 ENCOUNTER — Other Ambulatory Visit (INDEPENDENT_AMBULATORY_CARE_PROVIDER_SITE_OTHER): Payer: Medicare HMO

## 2018-11-15 ENCOUNTER — Ambulatory Visit (INDEPENDENT_AMBULATORY_CARE_PROVIDER_SITE_OTHER): Payer: Medicare HMO | Admitting: Internal Medicine

## 2018-11-15 VITALS — BP 118/72 | HR 53 | Temp 97.9°F | Ht 65.0 in

## 2018-11-15 DIAGNOSIS — F329 Major depressive disorder, single episode, unspecified: Secondary | ICD-10-CM | POA: Diagnosis not present

## 2018-11-15 DIAGNOSIS — R739 Hyperglycemia, unspecified: Secondary | ICD-10-CM

## 2018-11-15 DIAGNOSIS — F32A Depression, unspecified: Secondary | ICD-10-CM

## 2018-11-15 DIAGNOSIS — R634 Abnormal weight loss: Secondary | ICD-10-CM | POA: Diagnosis not present

## 2018-11-15 DIAGNOSIS — Z0000 Encounter for general adult medical examination without abnormal findings: Secondary | ICD-10-CM

## 2018-11-15 LAB — CBC WITH DIFFERENTIAL/PLATELET
Basophils Absolute: 0.1 10*3/uL (ref 0.0–0.1)
Basophils Relative: 0.3 % (ref 0.0–3.0)
Eosinophils Absolute: 0 10*3/uL (ref 0.0–0.7)
Eosinophils Relative: 0.3 % (ref 0.0–5.0)
HCT: 41.6 % (ref 39.0–52.0)
Hemoglobin: 13.7 g/dL (ref 13.0–17.0)
Lymphocytes Relative: 8.4 % — ABNORMAL LOW (ref 12.0–46.0)
Lymphs Abs: 1.3 10*3/uL (ref 0.7–4.0)
MCHC: 32.9 g/dL (ref 30.0–36.0)
MCV: 95.4 fl (ref 78.0–100.0)
Monocytes Absolute: 0.9 10*3/uL (ref 0.1–1.0)
Monocytes Relative: 5.8 % (ref 3.0–12.0)
Neutro Abs: 13.4 10*3/uL — ABNORMAL HIGH (ref 1.4–7.7)
Neutrophils Relative %: 85.2 % — ABNORMAL HIGH (ref 43.0–77.0)
Platelets: 319 10*3/uL (ref 150.0–400.0)
RBC: 4.36 Mil/uL (ref 4.22–5.81)
RDW: 15.6 % — ABNORMAL HIGH (ref 11.5–15.5)
WBC: 15.7 10*3/uL — ABNORMAL HIGH (ref 4.0–10.5)

## 2018-11-15 LAB — BASIC METABOLIC PANEL
BUN: 65 mg/dL — ABNORMAL HIGH (ref 6–23)
CO2: 25 mEq/L (ref 19–32)
Calcium: 10.5 mg/dL (ref 8.4–10.5)
Chloride: 101 mEq/L (ref 96–112)
Creatinine, Ser: 2.11 mg/dL — ABNORMAL HIGH (ref 0.40–1.50)
GFR: 29.52 mL/min — ABNORMAL LOW (ref >60.00)
GLUCOSE: 212 mg/dL — AB (ref 70–99)
Potassium: 4.1 mEq/L (ref 3.5–5.1)
SODIUM: 138 meq/L (ref 135–145)

## 2018-11-15 LAB — HEPATIC FUNCTION PANEL
ALT: 13 U/L (ref 0–53)
AST: 17 U/L (ref 0–37)
Albumin: 3 g/dL — ABNORMAL LOW (ref 3.5–5.2)
Alkaline Phosphatase: 89 U/L (ref 39–117)
BILIRUBIN TOTAL: 0.4 mg/dL (ref 0.2–1.2)
Bilirubin, Direct: 0.1 mg/dL (ref 0.0–0.3)
Total Protein: 6.8 g/dL (ref 6.0–8.3)

## 2018-11-15 LAB — LIPID PANEL
Cholesterol: 132 mg/dL (ref 0–200)
HDL: 47.3 mg/dL (ref >39.00)
LDL Cholesterol: 56 mg/dL (ref 0–99)
NonHDL: 84.45
Total CHOL/HDL Ratio: 3
Triglycerides: 143 mg/dL (ref 0.0–149.0)
VLDL: 28.6 mg/dL (ref 0.0–40.0)

## 2018-11-15 LAB — URINALYSIS, ROUTINE W REFLEX MICROSCOPIC
Bilirubin Urine: NEGATIVE
Hgb urine dipstick: NEGATIVE
Ketones, ur: NEGATIVE
Nitrite: POSITIVE — AB
Specific Gravity, Urine: 1.02 (ref 1.000–1.030)
Total Protein, Urine: 30 — AB
URINE GLUCOSE: NEGATIVE
Urobilinogen, UA: 0.2 (ref 0.0–1.0)
pH: 6 (ref 5.0–8.0)

## 2018-11-15 LAB — HEMOGLOBIN A1C: Hgb A1c MFr Bld: 6.5 % (ref 4.6–6.5)

## 2018-11-15 LAB — TSH: TSH: 1.54 u[IU]/mL (ref 0.35–4.50)

## 2018-11-15 MED ORDER — CEPHALEXIN 500 MG PO CAPS: 500.0000 mg | ORAL_CAPSULE | Freq: Three times a day (TID) | ORAL | 0 refills | Status: DC

## 2018-11-15 MED ORDER — MEGESTROL ACETATE 40 MG PO TABS: 40.0000 mg | ORAL_TABLET | Freq: Every day | ORAL | 3 refills | Status: AC

## 2018-11-15 MED ORDER — CITALOPRAM HYDROBROMIDE 10 MG PO TABS: 10.0000 mg | ORAL_TABLET | Freq: Every day | ORAL | 3 refills | Status: AC

## 2018-11-15 NOTE — Progress Notes (Signed)
Subjective:    Patient ID: [name], male    DOB: 05-Apr-1925, 85 y.o.   MRN: [id number]  HPI  Here for wellness and f/u;  Overall doing less well recently, tends to sleep off and on all day long owrse in the past 2-3 weeks, but no fever, ST, cough, or dysuria.;  Pt denies Chest pain, worsening SOB, DOE, wheezing, orthopnea, PND, worsening LE edema, palpitations, dizziness or syncope.  Pt denies neurological change such as new headache, facial or extremity weakness.  Pt denies polydipsia, polyuria, or low sugar symptoms. Pt states overall good compliance with treatment and medications, good tolerability, and has been trying to follow appropriate diet.  Pt denies worsening depressive symptoms, suicidal ideation or pani.  Pt states poor ability with ADL's, requires 24.7 care, remains blind and keeps saying he would rather die,has high fall risk, home safety reviewed and adequate, no other significant changes in hearing or vision, and not getting ouf the house to chruch anymore.  Had a fall last Sunday, had to call son to get him off the floor.  Not eating as well lately, has lost to 159 recently at rheum office, Had prednsone increased per rheum recently.  Takes Boost 1 can per day Wt Readings from Last 3 Encounters:  10/05/18 164 lb (74.4 kg)  07/15/18 165 lb 12 oz (75.2 kg)  06/23/18 168 lb (76.2 kg)  Has seen rheumatology, walking with walker now, not using cane.  In wheelchair today.  Past Medical History:  Diagnosis Date  . ALLERGIC RHINITIS 09/29/2007  . Aortic stenosis 02/05/2017  . Arthritis    "was in his knees" (01/19/2018)  . BACK PAIN 01/31/2009  . BENIGN PROSTATIC HYPERTROPHY 05/12/2007  . CHEST PAIN 01/31/2008  . CHRONIC OBSTRUCTIVE PULMONARY DISEASE, ACUTE EXACERBATION 08/17/2007  . CONJUNCTIVITIS, ALLERGIC, CHRONIC 03/24/2010  . CONSTIPATION 02/19/2010  . COPD 05/12/2007  . CVA (cerebral vascular accident) ([place]) 05/03/2011  . GERD 02/23/2010  . GOUT 07/04/2010  . Heart murmur   .  History of kidney stones    "passed it"  . History of stomach ulcers   . HYPERLIPIDEMIA 05/12/2007  . HYPERTENSION 05/12/2007  . NEPHROLITHIASIS, HX OF   . OBESITY 05/12/2007  . PEPTIC ULCER DISEASE 05/12/2007  . PERIPHERAL VASCULAR DISEASE 05/12/2007  . Personal history of unspecified circulatory disease 05/12/2007  . PLANTAR FASCIITIS, RIGHT 09/27/2008  . RENAL CELL CANCER 05/12/2007  . RENAL INSUFFICIENCY 08/17/2007  . SKIN LESION 11/07/2010  . VERTIGO 09/29/2007  . WRIST PAIN, RIGHT 02/26/2009   Past Surgical History:  Procedure Laterality Date  . ARTERY BIOPSY Bilateral 10/27/2017   Procedure: BILATERAL TEMPORAL ARTERY BIOPSY;  Surgeon: Serafina Mitchell, MD;  Location: [facility];  Service: Vascular;  Laterality: Bilateral;  . CARDIOVERSION N/A 03/21/2018   Procedure: CARDIOVERSION;  Surgeon: Skeet Latch, MD;  Location: [facility];  Service: Cardiovascular;  Laterality: N/A;  . CATARACT EXTRACTION W/ INTRAOCULAR LENS  IMPLANT, BILATERAL Bilateral   . FEMORAL-POPLITEAL BYPASS GRAFT Bilateral   . INNER EAR SURGERY Bilateral    "had it twice in one of his ears; once in the other"  . JOINT REPLACEMENT    . NEPHRECTOMY    . NEPHROURETERECTOMY    . TOTAL KNEE ARTHROPLASTY Left 06/2007  . TOTAL KNEE ARTHROPLASTY Right     reports that he has been smoking cigarettes. He has a 40.00 pack-year smoking history. He has never used smokeless tobacco. He reports that he does not drink alcohol or use drugs. family  history includes Alcohol abuse in his brother; Cancer in his daughter, mother, and other family members; Heart disease in his brother. Allergies  Allergen Reactions  . Sulfonamide Derivatives Rash   Current Outpatient Medications on File Prior to Visit  Medication Sig Dispense Refill  . allopurinol (ZYLOPRIM) 100 MG tablet Take 100 mg by mouth daily.     Marland Kitchen amLODipine (NORVASC) 5 MG tablet TAKE 1 TABLET (5 MG TOTAL) BY MOUTH DAILY. 90 tablet 3  . cetirizine (ZYRTEC) 10 MG tablet Take 10  mg by mouth daily as needed for allergies.     Marland Kitchen docusate sodium (COLACE) 100 MG capsule Take 200 mg by mouth daily as needed for mild constipation.     Marland Kitchen ELIQUIS 2.5 MG TABS tablet TAKE 1 TABLET(2.5 MG) BY MOUTH TWICE DAILY 60 tablet 10  . furosemide (LASIX) 20 MG tablet Take 2 tablets (40 mg total) by mouth daily. 30 tablet 0  . meclizine (ANTIVERT) 12.5 MG tablet TAKE 1 TABLET FOUR TIMES DAILY AS NEEDED 180 tablet 0  . mirtazapine (REMERON) 15 MG tablet TAKE 1 TABLET(15 MG) BY MOUTH AT BEDTIME 90 tablet 1  . omeprazole (PRILOSEC) 40 MG capsule TAKE 1 CAPSULE EVERY DAY 90 capsule 0  . polyethylene glycol (MIRALAX / GLYCOLAX) packet Take 17 g by mouth daily as needed for mild constipation.    . pravastatin (PRAVACHOL) 40 MG tablet Take 40 mg by mouth at bedtime.     . predniSONE (DELTASONE) 5 MG tablet TK 1 T PO QD  2   No current facility-administered medications on file prior to visit.     Review of Systems Family states all other system neg, pt sleepy unable today    Objective:   Physical Exam BP 118/72   Pulse (!) 53   Temp 97.9 F (36.6 C) (Oral)   Ht 5\' 5"  (1.651 m)   SpO2 95%   BMI 27.29 kg/m  VS noted,  Constitutional: Pt is oriented to person, place, and time. Appears well-developed and well-nourished, in no significant distress and comfortable Head: Normocephalic and atraumatic  Eyes: Conjunctivae and EOM are normal. Pupils are equal, round, and reactive to light Right Ear: External ear normal without discharge Left Ear: External ear normal without discharge Nose: Nose without discharge or deformity Mouth/Throat: Oropharynx is without other ulcerations and moist  Neck: Normal range of motion. Neck supple. No JVD present. No tracheal deviation present or significant neck LA or mass Cardiovascular: Normal rate, regular rhythm, normal heart sounds and intact distal pulses.   Pulmonary/Chest: WOB normal and breath sounds without rales or wheezing  Abdominal: Soft. Bowel  sounds are normal. NT. No HSM  Musculoskeletal: Normal range of motion. Exhibits no edema Lymphadenopathy: Has no other cervical adenopathy.  Neurological: Pt is alert and oriented to person, place, and time. Pt has normal reflexes. No cranial nerve deficit. Motor grossly intact, Gait intact Skin: Skin is warm and dry. No rash noted or new ulcerations Psychiatric:  Has sleepy mood and affect. Behavior is normal without agitation No other exam findings Lab Results  Component Value Date   WBC 10.6 03/08/2018   HGB 13.9 03/21/2018   HCT 41.0 03/21/2018   PLT 344 03/08/2018   GLUCOSE 110 (H) 06/06/2018   CHOL 101 10/26/2017   TRIG 103 10/26/2017   HDL 37 (L) 10/26/2017   LDLCALC 43 10/26/2017   ALT 27 01/19/2018   AST 18 01/19/2018   NA 141 06/06/2018   K 4.2 06/06/2018   CL  99 06/06/2018   CREATININE 1.85 (H) 06/06/2018   BUN 46 (H) 06/06/2018   CO2 26 06/06/2018   TSH 1.045 11/27/2017   PSA 0.73 10/18/2017   INR 1.21 10/27/2017   HGBA1C 5.5 10/26/2017           Assessment & Plan:

## 2018-11-15 NOTE — Assessment & Plan Note (Signed)
stable overall by history and exam, recent data reviewed with pt, and pt to continue medical treatment as before,  to f/u any worsening symptoms or concerns, for a1c with labs 

## 2018-11-15 NOTE — Assessment & Plan Note (Signed)

## 2018-11-15 NOTE — Assessment & Plan Note (Signed)
[place] for trial change remeron to celexa 10 qd to avoid more sleepiness

## 2018-11-15 NOTE — Assessment & Plan Note (Signed)
With reduced overall po intake over months, cont Boost at 2 cans per day, add megace asd,  to f/u any worsening symptoms or concerns

## 2018-11-15 NOTE — Patient Instructions (Signed)
Please take all new medication as prescribed - the celexa 10 mg per day for depression, and megace for wt loss  OK to stop the remeron at bedtime  OK to increase the Boost to 2 cans per day  Please continue all other medications as before, and refills have been done if requested.  Please have the pharmacy call with any other refills you may need.  Please keep your appointments with your specialists as you may have planned  Please go to the LAB in the Basement (turn left off the elevator) for the tests to be done today  You will be contacted by phone if any changes need to be made immediately.  Otherwise, you will receive a letter about your results with an explanation, but please check with MyChart first.  Please remember to sign up for MyChart if you have not done so, as this will be important to you in the future with finding out test results, communicating by private email, and scheduling acute appointments online when needed.  Please return in 4 months, or sooner if needed

## 2018-11-16 ENCOUNTER — Telehealth: Payer: Self-pay

## 2018-11-16 NOTE — Telephone Encounter (Signed)
-----   Message from [name], MD sent at 11/16/2018  8:27 AM EDT ----- Letter sent, cont same tx except  The test results show that your current treatment is OK, except the blood white cells are mildly elevated.  This is likely due to the finding of the urinary tract infection.  Please continue the antibiotic as prescribed, but be sure to go the emergency department for any worsening fever, weakness, falls, other unusual symptoms, or persistent decreased fluid intake.[name] to please inform pt family; they should have low threshold to take to the ED if he appears more ill

## 2018-11-16 NOTE — Telephone Encounter (Signed)
Pt's wife has been informed and expressed understanding. 

## 2018-11-22 ENCOUNTER — Encounter (HOSPITAL_COMMUNITY): Payer: Self-pay

## 2018-11-22 ENCOUNTER — Emergency Department (HOSPITAL_COMMUNITY): Payer: Medicare HMO

## 2018-11-22 ENCOUNTER — Inpatient Hospital Stay (HOSPITAL_COMMUNITY)
Admission: EM | Admit: 2018-11-22 | Discharge: 2018-11-26 | DRG: 871 | Disposition: A | Discharge status: Hospice | Payer: Medicare HMO | Attending: Internal Medicine | Admitting: Internal Medicine

## 2018-11-22 DIAGNOSIS — I350 Nonrheumatic aortic (valve) stenosis: Secondary | ICD-10-CM

## 2018-11-22 DIAGNOSIS — Z7952 Long term (current) use of systemic steroids: Secondary | ICD-10-CM

## 2018-11-22 DIAGNOSIS — D72829 Elevated white blood cell count, unspecified: Secondary | ICD-10-CM | POA: Diagnosis not present

## 2018-11-22 DIAGNOSIS — Z515 Encounter for palliative care: Secondary | ICD-10-CM | POA: Diagnosis not present

## 2018-11-22 DIAGNOSIS — G92 Toxic encephalopathy: Secondary | ICD-10-CM | POA: Diagnosis not present

## 2018-11-22 DIAGNOSIS — K219 Gastro-esophageal reflux disease without esophagitis: Secondary | ICD-10-CM | POA: Diagnosis present

## 2018-11-22 DIAGNOSIS — Z7189 Other specified counseling: Secondary | ICD-10-CM | POA: Diagnosis not present

## 2018-11-22 DIAGNOSIS — Z7401 Bed confinement status: Secondary | ICD-10-CM | POA: Diagnosis not present

## 2018-11-22 DIAGNOSIS — R627 Adult failure to thrive: Secondary | ICD-10-CM | POA: Diagnosis present

## 2018-11-22 DIAGNOSIS — R402132 Coma scale, eyes open, to sound, at arrival to emergency department: Secondary | ICD-10-CM | POA: Diagnosis present

## 2018-11-22 DIAGNOSIS — Z8673 Personal history of transient ischemic attack (TIA), and cerebral infarction without residual deficits: Secondary | ICD-10-CM

## 2018-11-22 DIAGNOSIS — G9341 Metabolic encephalopathy: Secondary | ICD-10-CM | POA: Diagnosis not present

## 2018-11-22 DIAGNOSIS — H9190 Unspecified hearing loss, unspecified ear: Secondary | ICD-10-CM | POA: Diagnosis present

## 2018-11-22 DIAGNOSIS — Z811 Family history of alcohol abuse and dependence: Secondary | ICD-10-CM | POA: Diagnosis not present

## 2018-11-22 DIAGNOSIS — I4819 Other persistent atrial fibrillation: Secondary | ICD-10-CM | POA: Diagnosis not present

## 2018-11-22 DIAGNOSIS — A021 Salmonella sepsis: Secondary | ICD-10-CM | POA: Diagnosis not present

## 2018-11-22 DIAGNOSIS — R109 Unspecified abdominal pain: Secondary | ICD-10-CM

## 2018-11-22 DIAGNOSIS — I10 Essential (primary) hypertension: Secondary | ICD-10-CM | POA: Diagnosis present

## 2018-11-22 DIAGNOSIS — J449 Chronic obstructive pulmonary disease, unspecified: Secondary | ICD-10-CM | POA: Diagnosis not present

## 2018-11-22 DIAGNOSIS — Z96653 Presence of artificial knee joint, bilateral: Secondary | ICD-10-CM | POA: Diagnosis present

## 2018-11-22 DIAGNOSIS — M2550 Pain in unspecified joint: Secondary | ICD-10-CM | POA: Diagnosis not present

## 2018-11-22 DIAGNOSIS — I5032 Chronic diastolic (congestive) heart failure: Secondary | ICD-10-CM | POA: Diagnosis not present

## 2018-11-22 DIAGNOSIS — M109 Gout, unspecified: Secondary | ICD-10-CM | POA: Diagnosis present

## 2018-11-22 DIAGNOSIS — R7989 Other specified abnormal findings of blood chemistry: Secondary | ICD-10-CM | POA: Diagnosis present

## 2018-11-22 DIAGNOSIS — M316 Other giant cell arteritis: Secondary | ICD-10-CM | POA: Diagnosis present

## 2018-11-22 DIAGNOSIS — R402352 Coma scale, best motor response, localizes pain, at arrival to emergency department: Secondary | ICD-10-CM | POA: Diagnosis present

## 2018-11-22 DIAGNOSIS — E872 Acidosis, unspecified: Secondary | ICD-10-CM

## 2018-11-22 DIAGNOSIS — J430 Unilateral pulmonary emphysema [MacLeod's syndrome]: Secondary | ICD-10-CM | POA: Diagnosis not present

## 2018-11-22 DIAGNOSIS — R4020 Unspecified coma: Secondary | ICD-10-CM | POA: Diagnosis not present

## 2018-11-22 DIAGNOSIS — Z8249 Family history of ischemic heart disease and other diseases of the circulatory system: Secondary | ICD-10-CM | POA: Diagnosis not present

## 2018-11-22 DIAGNOSIS — I272 Pulmonary hypertension, unspecified: Secondary | ICD-10-CM | POA: Diagnosis present

## 2018-11-22 DIAGNOSIS — E860 Dehydration: Secondary | ICD-10-CM | POA: Diagnosis present

## 2018-11-22 DIAGNOSIS — Z8711 Personal history of peptic ulcer disease: Secondary | ICD-10-CM | POA: Diagnosis not present

## 2018-11-22 DIAGNOSIS — Z66 Do not resuscitate: Secondary | ICD-10-CM | POA: Diagnosis not present

## 2018-11-22 DIAGNOSIS — E7800 Pure hypercholesterolemia, unspecified: Secondary | ICD-10-CM | POA: Diagnosis not present

## 2018-11-22 DIAGNOSIS — Z6824 Body mass index (BMI) 24.0-24.9, adult: Secondary | ICD-10-CM

## 2018-11-22 DIAGNOSIS — N400 Enlarged prostate without lower urinary tract symptoms: Secondary | ICD-10-CM | POA: Diagnosis present

## 2018-11-22 DIAGNOSIS — R011 Cardiac murmur, unspecified: Secondary | ICD-10-CM | POA: Diagnosis present

## 2018-11-22 DIAGNOSIS — I739 Peripheral vascular disease, unspecified: Secondary | ICD-10-CM | POA: Diagnosis not present

## 2018-11-22 DIAGNOSIS — R402242 Coma scale, best verbal response, confused conversation, at arrival to emergency department: Secondary | ICD-10-CM | POA: Diagnosis present

## 2018-11-22 DIAGNOSIS — Z7901 Long term (current) use of anticoagulants: Secondary | ICD-10-CM

## 2018-11-22 DIAGNOSIS — I130 Hypertensive heart and chronic kidney disease with heart failure and stage 1 through stage 4 chronic kidney disease, or unspecified chronic kidney disease: Secondary | ICD-10-CM | POA: Diagnosis not present

## 2018-11-22 DIAGNOSIS — A419 Sepsis, unspecified organism: Principal | ICD-10-CM | POA: Diagnosis present

## 2018-11-22 DIAGNOSIS — F17210 Nicotine dependence, cigarettes, uncomplicated: Secondary | ICD-10-CM | POA: Diagnosis present

## 2018-11-22 DIAGNOSIS — F17200 Nicotine dependence, unspecified, uncomplicated: Secondary | ICD-10-CM | POA: Diagnosis present

## 2018-11-22 DIAGNOSIS — R001 Bradycardia, unspecified: Secondary | ICD-10-CM | POA: Diagnosis present

## 2018-11-22 DIAGNOSIS — R4182 Altered mental status, unspecified: Secondary | ICD-10-CM | POA: Diagnosis not present

## 2018-11-22 DIAGNOSIS — N390 Urinary tract infection, site not specified: Secondary | ICD-10-CM | POA: Diagnosis not present

## 2018-11-22 DIAGNOSIS — N183 Chronic kidney disease, stage 3 unspecified: Secondary | ICD-10-CM | POA: Diagnosis present

## 2018-11-22 DIAGNOSIS — E785 Hyperlipidemia, unspecified: Secondary | ICD-10-CM | POA: Diagnosis present

## 2018-11-22 DIAGNOSIS — I4891 Unspecified atrial fibrillation: Secondary | ICD-10-CM | POA: Diagnosis not present

## 2018-11-22 DIAGNOSIS — R5381 Other malaise: Secondary | ICD-10-CM | POA: Diagnosis not present

## 2018-11-22 DIAGNOSIS — R0902 Hypoxemia: Secondary | ICD-10-CM | POA: Diagnosis not present

## 2018-11-22 DIAGNOSIS — R410 Disorientation, unspecified: Secondary | ICD-10-CM | POA: Diagnosis not present

## 2018-11-22 DIAGNOSIS — Z882 Allergy status to sulfonamides status: Secondary | ICD-10-CM

## 2018-11-22 DIAGNOSIS — R0689 Other abnormalities of breathing: Secondary | ICD-10-CM | POA: Diagnosis not present

## 2018-11-22 DIAGNOSIS — R000 Tachycardia, unspecified: Secondary | ICD-10-CM | POA: Diagnosis not present

## 2018-11-22 HISTORY — DX: Sepsis, unspecified organism: A41.9

## 2018-11-22 LAB — COMPREHENSIVE METABOLIC PANEL
ALT: 19 U/L (ref 0–44)
AST: 28 U/L (ref 15–41)
Albumin: 2.2 g/dL — ABNORMAL LOW (ref 3.5–5.0)
Alkaline Phosphatase: 91 U/L (ref 38–126)
Anion gap: 9 (ref 5–15)
BUN: 46 mg/dL — ABNORMAL HIGH (ref 8–23)
CO2: 20 mmol/L — AB (ref 22–32)
Calcium: 10.7 mg/dL — ABNORMAL HIGH (ref 8.9–10.3)
Chloride: 107 mmol/L (ref 98–111)
Creatinine, Ser: 1.8 mg/dL — ABNORMAL HIGH (ref 0.61–1.24)
GFR calc non Af Amer: 32 mL/min — ABNORMAL LOW (ref >60)
GFR, EST AFRICAN AMERICAN: 37 mL/min — AB (ref >60)
Glucose, Bld: 100 mg/dL — ABNORMAL HIGH (ref 70–99)
Potassium: 4 mmol/L (ref 3.5–5.1)
SODIUM: 136 mmol/L (ref 135–145)
Total Bilirubin: 0.8 mg/dL (ref 0.3–1.2)
Total Protein: 6.4 g/dL — ABNORMAL LOW (ref 6.5–8.1)

## 2018-11-22 LAB — CBC WITH DIFFERENTIAL/PLATELET
Abs Immature Granulocytes: 0.12 10*3/uL — ABNORMAL HIGH (ref 0.00–0.07)
Basophils Absolute: 0 10*3/uL (ref 0.0–0.1)
Basophils Relative: 0 %
EOS ABS: 0.2 10*3/uL (ref 0.0–0.5)
Eosinophils Relative: 1 %
HEMATOCRIT: 44 % (ref 39.0–52.0)
Hemoglobin: 14.1 g/dL (ref 13.0–17.0)
Immature Granulocytes: 1 %
LYMPHS ABS: 4.6 10*3/uL — AB (ref 0.7–4.0)
Lymphocytes Relative: 25 %
MCH: 30.4 pg (ref 26.0–34.0)
MCHC: 32 g/dL (ref 30.0–36.0)
MCV: 94.8 fL (ref 80.0–100.0)
Monocytes Absolute: 1.5 10*3/uL — ABNORMAL HIGH (ref 0.1–1.0)
Monocytes Relative: 8 %
Neutro Abs: 12.2 10*3/uL — ABNORMAL HIGH (ref 1.7–7.7)
Neutrophils Relative %: 65 %
Platelets: 293 10*3/uL (ref 150–400)
RBC: 4.64 MIL/uL (ref 4.22–5.81)
RDW: 14.8 % (ref 11.5–15.5)
WBC: 18.7 10*3/uL — ABNORMAL HIGH (ref 4.0–10.5)
nRBC: 0 % (ref 0.0–0.2)

## 2018-11-22 LAB — URINALYSIS, ROUTINE W REFLEX MICROSCOPIC
Bilirubin Urine: NEGATIVE
Glucose, UA: NEGATIVE mg/dL
Ketones, ur: NEGATIVE mg/dL
Leukocytes,Ua: NEGATIVE
Nitrite: NEGATIVE
PH: 6 (ref 5.0–8.0)
Protein, ur: 100 mg/dL — AB
Specific Gravity, Urine: 1.02 (ref 1.005–1.030)

## 2018-11-22 LAB — LIPID PANEL
Cholesterol: 133 mg/dL (ref 0–200)
HDL: 51 mg/dL (ref >40)
LDL Cholesterol: 62 mg/dL (ref 0–99)
Total CHOL/HDL Ratio: 2.6 RATIO
Triglycerides: 100 mg/dL (ref <150)
VLDL: 20 mg/dL (ref 0–40)

## 2018-11-22 LAB — URINALYSIS, MICROSCOPIC (REFLEX): Bacteria, UA: NONE SEEN

## 2018-11-22 LAB — LACTIC ACID, PLASMA
Lactic Acid, Venous: 2.5 mmol/L (ref 0.5–1.9)
Lactic Acid, Venous: 1.4 mmol/L (ref 0.5–1.9)

## 2018-11-22 LAB — MAGNESIUM: Magnesium: 2 mg/dL (ref 1.7–2.4)

## 2018-11-22 MED ORDER — ASPIRIN EC 81 MG PO TBEC
81.0000 mg | DELAYED_RELEASE_TABLET | Freq: Every day | ORAL | Status: DC
  Administered 2018-11-22 – 2018-11-25 (×4): 81 mg via ORAL
  Filled 2018-11-22 (×5): qty 1

## 2018-11-22 MED ORDER — MIRTAZAPINE 15 MG PO TABS
15.0000 mg | ORAL_TABLET | Freq: Every day | ORAL | Status: DC
  Administered 2018-11-22 – 2018-11-23 (×2): 15 mg via ORAL
  Filled 2018-11-22 (×2): qty 1

## 2018-11-22 MED ORDER — DOCUSATE SODIUM 100 MG PO CAPS: 200.0000 mg | ORAL_CAPSULE | Freq: Every day | ORAL | Status: DC | PRN

## 2018-11-22 MED ORDER — APIXABAN 2.5 MG PO TABS
2.5000 mg | ORAL_TABLET | Freq: Two times a day (BID) | ORAL | Status: DC
  Administered 2018-11-22 – 2018-11-25 (×7): 2.5 mg via ORAL
  Filled 2018-11-22 (×9): qty 1

## 2018-11-22 MED ORDER — MEGESTROL ACETATE 40 MG PO TABS
40.0000 mg | ORAL_TABLET | Freq: Every day | ORAL | Status: DC
  Administered 2018-11-22 – 2018-11-25 (×4): 40 mg via ORAL
  Filled 2018-11-22 (×5): qty 1

## 2018-11-22 MED ORDER — VANCOMYCIN HCL IN DEXTROSE 1-5 GM/200ML-% IV SOLN: 1000.0000 mg | Freq: Once | INTRAVENOUS | Status: DC

## 2018-11-22 MED ORDER — AMLODIPINE BESYLATE 5 MG PO TABS
5.0000 mg | ORAL_TABLET | Freq: Every day | ORAL | Status: DC
  Administered 2018-11-22 – 2018-11-25 (×4): 5 mg via ORAL
  Filled 2018-11-22 (×5): qty 1

## 2018-11-22 MED ORDER — ACETAMINOPHEN 325 MG PO TABS
650.0000 mg | ORAL_TABLET | Freq: Four times a day (QID) | ORAL | Status: DC | PRN
  Administered 2018-11-22: 650 mg via ORAL
  Filled 2018-11-22: qty 2

## 2018-11-22 MED ORDER — SODIUM CHLORIDE 0.9 % IV SOLN
INTRAVENOUS | Status: DC
  Administered 2018-11-22 – 2018-11-23 (×2): via INTRAVENOUS

## 2018-11-22 MED ORDER — TRAMADOL HCL 50 MG PO TABS
50.0000 mg | ORAL_TABLET | Freq: Four times a day (QID) | ORAL | Status: DC | PRN
  Administered 2018-11-22 – 2018-11-23 (×2): 50 mg via ORAL
  Filled 2018-11-22 (×2): qty 1

## 2018-11-22 MED ORDER — METRONIDAZOLE IN NACL 5-0.79 MG/ML-% IV SOLN
500.0000 mg | Freq: Once | INTRAVENOUS | Status: AC
  Administered 2018-11-22: 500 mg via INTRAVENOUS
  Filled 2018-11-22: qty 100

## 2018-11-22 MED ORDER — PREDNISONE 5 MG PO TABS
5.0000 mg | ORAL_TABLET | Freq: Every day | ORAL | Status: DC
  Administered 2018-11-23 – 2018-11-25 (×3): 5 mg via ORAL
  Filled 2018-11-22 (×4): qty 1

## 2018-11-22 MED ORDER — ALLOPURINOL 100 MG PO TABS
100.0000 mg | ORAL_TABLET | Freq: Every day | ORAL | Status: DC
  Administered 2018-11-22 – 2018-11-25 (×4): 100 mg via ORAL
  Filled 2018-11-22 (×5): qty 1

## 2018-11-22 MED ORDER — PANTOPRAZOLE SODIUM 40 MG PO TBEC
80.0000 mg | DELAYED_RELEASE_TABLET | Freq: Every day | ORAL | Status: DC
  Administered 2018-11-22 – 2018-11-25 (×4): 80 mg via ORAL
  Filled 2018-11-22 (×5): qty 2

## 2018-11-22 MED ORDER — IPRATROPIUM-ALBUTEROL 0.5-2.5 (3) MG/3ML IN SOLN
3.0000 mL | Freq: Four times a day (QID) | RESPIRATORY_TRACT | Status: DC | PRN
  Administered 2018-11-25: 3 mL via RESPIRATORY_TRACT
  Filled 2018-11-22: qty 3

## 2018-11-22 MED ORDER — ACETAMINOPHEN 650 MG RE SUPP: 650.0000 mg | Freq: Four times a day (QID) | RECTAL | Status: DC | PRN

## 2018-11-22 MED ORDER — SODIUM CHLORIDE 0.9 % IV SOLN
1.0000 g | INTRAVENOUS | Status: DC
  Administered 2018-11-23 – 2018-11-25 (×3): 1 g via INTRAVENOUS
  Filled 2018-11-22 (×4): qty 1

## 2018-11-22 MED ORDER — VANCOMYCIN VARIABLE DOSE PER UNSTABLE RENAL FUNCTION (PHARMACIST DOSING): Status: DC

## 2018-11-22 MED ORDER — CITALOPRAM HYDROBROMIDE 20 MG PO TABS
10.0000 mg | ORAL_TABLET | Freq: Every day | ORAL | Status: DC
  Administered 2018-11-22 – 2018-11-23 (×2): 10 mg via ORAL
  Filled 2018-11-22 (×2): qty 1

## 2018-11-22 MED ORDER — SODIUM CHLORIDE 0.9 % IV SOLN: 2.0000 g | Freq: Once | INTRAVENOUS | Status: DC

## 2018-11-22 MED ORDER — VANCOMYCIN HCL 10 G IV SOLR
1500.0000 mg | Freq: Once | INTRAVENOUS | Status: AC
  Administered 2018-11-22: 1500 mg via INTRAVENOUS
  Filled 2018-11-22: qty 1500

## 2018-11-22 MED ORDER — POLYETHYLENE GLYCOL 3350 17 G PO PACK: 17.0000 g | PACK | Freq: Every day | ORAL | Status: DC | PRN

## 2018-11-22 MED ORDER — SODIUM CHLORIDE 0.9 % IV BOLUS (SEPSIS)
1000.0000 mL | Freq: Once | INTRAVENOUS | Status: AC
  Administered 2018-11-22: 1000 mL via INTRAVENOUS

## 2018-11-22 MED ORDER — TRAZODONE HCL 50 MG PO TABS
25.0000 mg | ORAL_TABLET | Freq: Every evening | ORAL | Status: DC | PRN
  Administered 2018-11-22: 25 mg via ORAL
  Filled 2018-11-22: qty 1

## 2018-11-22 MED ORDER — PRAVASTATIN SODIUM 40 MG PO TABS
40.0000 mg | ORAL_TABLET | Freq: Every day | ORAL | Status: DC
  Administered 2018-11-22 – 2018-11-25 (×4): 40 mg via ORAL
  Filled 2018-11-22 (×4): qty 1

## 2018-11-22 MED ORDER — SODIUM CHLORIDE 0.9 % IV SOLN
2.0000 g | Freq: Once | INTRAVENOUS | Status: AC
  Administered 2018-11-22: 2 g via INTRAVENOUS
  Filled 2018-11-22: qty 2

## 2018-11-22 NOTE — ED Provider Notes (Signed)
[facility] EMERGENCY DEPARTMENT Provider Note   CSN: [id number] Arrival date & time: 11/22/18  1201    History   Chief Complaint No chief complaint on file.   HPI [name] is a 85 y.o. male.     The history is provided by the EMS personnel and medical records. The history is limited by the condition of the patient.  Altered Mental Status  Presenting symptoms: confusion, disorientation and partial responsiveness   Severity:  Severe Duration:  4 days Progression:  Worsening Chronicity:  New Context: recent illness and recent infection   Context: not head injury and not recent change in medication   Associated symptoms: weakness    85 year old male with an extensive past medical history including aortic stenosis, COPD, previous CVA peptic ulcer disease, peripheral vascular disease chronic renal insufficiency.  Patient brought in by EMS for altered mental status.  There is a level 5 caveat due to the patient's condition.  EMS reports that the patient is coming from home where he lives with his wife.  Apparently he was recently diagnosed with a urinary tract infection and is currently taking an antibiotic which is at this point unknown.  Patient has had progressively worsening decline in his mental status and is currently only oriented to self.  He has been increasingly weak and somnolent. He had one fall due to weakness earlier this week.   Past Medical History:  Diagnosis Date  . ALLERGIC RHINITIS 09/29/2007  . Aortic stenosis 02/05/2017  . Arthritis    "was in his knees" (01/19/2018)  . BACK PAIN 01/31/2009  . BENIGN PROSTATIC HYPERTROPHY 05/12/2007  . CHEST PAIN 01/31/2008  . CHRONIC OBSTRUCTIVE PULMONARY DISEASE, ACUTE EXACERBATION 08/17/2007  . CONJUNCTIVITIS, ALLERGIC, CHRONIC 03/24/2010  . CONSTIPATION 02/19/2010  . COPD 05/12/2007  . CVA (cerebral vascular accident) ([place]) 05/03/2011  . GERD 02/23/2010  . GOUT 07/04/2010  . Heart murmur   . History of  kidney stones    "passed it"  . History of stomach ulcers   . HYPERLIPIDEMIA 05/12/2007  . HYPERTENSION 05/12/2007  . NEPHROLITHIASIS, HX OF   . OBESITY 05/12/2007  . PEPTIC ULCER DISEASE 05/12/2007  . PERIPHERAL VASCULAR DISEASE 05/12/2007  . Personal history of unspecified circulatory disease 05/12/2007  . PLANTAR FASCIITIS, RIGHT 09/27/2008  . RENAL CELL CANCER 05/12/2007  . RENAL INSUFFICIENCY 08/17/2007  . SKIN LESION 11/07/2010  . VERTIGO 09/29/2007  . WRIST PAIN, RIGHT 02/26/2009    Patient Active Problem List   Diagnosis Date Noted  . Solitary kidney, acquired 10/28/2018  . Rib pain on left side 10/28/2018  . Bradycardia 07/15/2018  . Persistent atrial fibrillation 03/08/2018  . Chronic diastolic CHF (congestive heart failure) ([place]) 03/08/2018  . Pressure injury of skin 01/20/2018  . SOB (shortness of breath) 01/19/2018  . Bilateral lower extremity edema 01/19/2018  . Acute kidney injury superimposed on CKD ([place]) 01/19/2018  . Elevated lactic acid level 01/19/2018  . Hyperglycemia 01/19/2018  . Hyponatremia 11/22/2017  . Peripheral edema 11/08/2017  . Thrush 11/08/2017  . Temporal arteritis ([place]) 11/03/2017  . Vision loss, bilateral 10/26/2017  . Weight loss 10/18/2017  . Flank pain 10/18/2017  . Right arm pain 10/18/2017  . Depression 10/18/2017  . Aortic stenosis 02/05/2017  . Heart murmur 01/05/2017  . Skin lesion 01/04/2016  . Low back pain 12/21/2013  . Lumbar radiculitis 12/28/2012  . Smoker unmotivated to quit 11/14/2012  . Cough 11/06/2011  . Leukocytosis 11/06/2011  . History of CVA (cerebrovascular  accident) 05/03/2011  . Preventative health care 05/03/2011  . SKIN LESION 11/07/2010  . GOUT 07/04/2010  . CONJUNCTIVITIS, ALLERGIC, CHRONIC 03/24/2010  . GERD 02/23/2010  . Constipation 02/19/2010  . WRIST PAIN, RIGHT 02/26/2009  . BACK PAIN 01/31/2009  . PLANTAR FASCIITIS, RIGHT 09/27/2008  . CHEST PAIN 01/31/2008  . ALLERGIC RHINITIS 09/29/2007  .  VERTIGO 09/29/2007  . CKD (chronic kidney disease), stage III ([place]) 08/17/2007  . NEPHROLITHIASIS, HX OF 08/17/2007  . Malignant neoplasm of kidney excluding renal pelvis ([place]) 05/12/2007  . Hyperlipidemia 05/12/2007  . OBESITY 05/12/2007  . Essential hypertension 05/12/2007  . PERIPHERAL VASCULAR DISEASE 05/12/2007  . COPD (chronic obstructive pulmonary disease) ([place]) 05/12/2007  . PEPTIC ULCER DISEASE 05/12/2007  . BENIGN PROSTATIC HYPERTROPHY 05/12/2007    Past Surgical History:  Procedure Laterality Date  . ARTERY BIOPSY Bilateral 10/27/2017   Procedure: BILATERAL TEMPORAL ARTERY BIOPSY;  Surgeon: Serafina Mitchell, MD;  Location: [facility] OR;  Service: Vascular;  Laterality: Bilateral;  . CARDIOVERSION N/A 03/21/2018   Procedure: CARDIOVERSION;  Surgeon: Skeet Latch, MD;  Location: [facility];  Service: Cardiovascular;  Laterality: N/A;  . CATARACT EXTRACTION W/ INTRAOCULAR LENS  IMPLANT, BILATERAL Bilateral   . FEMORAL-POPLITEAL BYPASS GRAFT Bilateral   . INNER EAR SURGERY Bilateral    "had it twice in one of his ears; once in the other"  . JOINT REPLACEMENT    . NEPHRECTOMY    . NEPHROURETERECTOMY    . TOTAL KNEE ARTHROPLASTY Left 06/2007  . TOTAL KNEE ARTHROPLASTY Right         Home Medications    Prior to Admission medications   Medication Sig Start Date End Date Taking? Authorizing Provider  allopurinol (ZYLOPRIM) 100 MG tablet Take 100 mg by mouth daily.     [provider]  amLODipine (NORVASC) 5 MG tablet TAKE 1 TABLET (5 MG TOTAL) BY MOUTH DAILY. 12/14/17   [name], MD  cephALEXin (KEFLEX) 500 MG capsule Take 1 capsule (500 mg total) by mouth 3 (three) times daily for 10 days. 11/15/18 11/25/18  [name], MD  cetirizine (ZYRTEC) 10 MG tablet Take 10 mg by mouth daily as needed for allergies.     [provider]  citalopram (CELEXA) 10 MG tablet Take 1 tablet (10 mg total) by mouth daily. 11/15/18 11/15/19  [name], MD  docusate  sodium (COLACE) 100 MG capsule Take 200 mg by mouth daily as needed for mild constipation.     [provider]  ELIQUIS 2.5 MG TABS tablet TAKE 1 TABLET(2.5 MG) BY MOUTH TWICE DAILY 08/26/18   Camnitz, Ocie Doyne, MD  furosemide (LASIX) 20 MG tablet Take 2 tablets (40 mg total) by mouth daily. 01/23/18   Elgergawy, Silver Huguenin, MD  meclizine (ANTIVERT) 12.5 MG tablet TAKE 1 TABLET FOUR TIMES DAILY AS NEEDED 05/13/18   [name], MD  megestrol (MEGACE) 40 MG tablet Take 1 tablet (40 mg total) by mouth daily. 11/15/18   [name], MD  omeprazole (PRILOSEC) 40 MG capsule TAKE 1 CAPSULE EVERY DAY 10/04/18   [name], MD  polyethylene glycol [facility] / Floria Raveling) packet Take 17 g by mouth daily as needed for mild constipation.    [provider]  pravastatin (PRAVACHOL) 40 MG tablet Take 40 mg by mouth at bedtime.     [provider]  predniSONE (DELTASONE) 5 MG tablet TK 1 T PO QD 06/23/18   [provider]    Family History  Family History  Problem Relation Age of Onset  . Cancer Mother        colon cancer  . Heart disease Brother   . Alcohol abuse Brother   . Cancer Daughter        bladder cancer  . Cancer Other        grandson leukemia and BMT  . Cancer Other        sibling with pancreatic cancer    Social History Social History   Tobacco Use  . Smoking status: Current Every Day Smoker    Packs/day: 0.50    Years: 80.00    Pack years: 40.00    Types: Cigarettes  . Smokeless tobacco: Never Used  Substance Use Topics  . Alcohol use: No  . Drug use: No     Allergies   Sulfonamide derivatives   Review of Systems Review of Systems  Neurological: Positive for weakness.  Psychiatric/Behavioral: Positive for confusion.   Unable to review systems due to altered mentation.   Physical Exam Updated Vital Signs Ht 5\' 5"  (1.651 m)   Wt 74.4 kg   BMI 27.29 kg/m   Physical Exam Constitutional:      Appearance: He is ill-appearing  and toxic-appearing.  HENT:     Head: Normocephalic and atraumatic.     Right Ear: External ear normal.     Left Ear: External ear normal.     Nose: Nose normal.     Mouth/Throat:     Mouth: Mucous membranes are dry.  Eyes:     Pupils: Pupils are equal, round, and reactive to light.  Neck:     Musculoskeletal: No muscular tenderness.     Vascular: No carotid bruit or JVD.  Cardiovascular:     Rate and Rhythm: Normal rate.     Heart sounds: Murmur present. Systolic murmur present.     Comments: Heart sounds are distant Pulmonary:     Effort: Pulmonary effort is normal. Tachypnea present.     Breath sounds: No wheezing or rhonchi.  Abdominal:     General: Bowel sounds are normal.     Palpations: Abdomen is soft.     Tenderness: There is no abdominal tenderness.  Musculoskeletal:        General: No swelling.  Skin:    General: Skin is warm and dry.     Capillary Refill: Capillary refill takes less than 2 seconds.     Coloration: Skin is not jaundiced.  Neurological:     Mental Status: He is lethargic.     GCS: GCS eye subscore is 3. GCS verbal subscore is 4. GCS motor subscore is 5.      ED Treatments / Results  Labs (all labs ordered are listed, but only abnormal results are displayed) Labs Reviewed  CULTURE, BLOOD (ROUTINE X 2)  CULTURE, BLOOD (ROUTINE X 2)  URINE CULTURE  LACTIC ACID, PLASMA  LACTIC ACID, PLASMA  COMPREHENSIVE METABOLIC PANEL  CBC WITH DIFFERENTIAL/PLATELET  URINALYSIS, ROUTINE W REFLEX MICROSCOPIC    EKG None  Radiology No results found.  Procedures .Critical Care Performed by: Margarita Mail, PA-C Authorized by: Margarita Mail, PA-C   Critical care provider statement:    Critical care time (minutes):  45   Critical care was necessary to treat or prevent imminent or life-threatening deterioration of the following conditions:  Sepsis and CNS failure or compromise   Critical care was time spent personally by me on the following  activities:  Discussions with consultants, evaluation of  patient's response to treatment, examination of patient, ordering and performing treatments and interventions, ordering and review of laboratory studies, ordering and review of radiographic studies, pulse oximetry, re-evaluation of patient's condition, obtaining history from patient or surrogate and review of old charts   (including critical care time)  Medications Ordered in ED Medications  ceFEPIme (MAXIPIME) 2 g in sodium chloride 0.9 % 100 mL IVPB (has no administration in time range)  metroNIDAZOLE (FLAGYL) IVPB 500 mg (has no administration in time range)  vancomycin (VANCOCIN) IVPB 1000 mg/200 mL premix (has no administration in time range)  sodium chloride 0.9 % bolus 1,000 mL (has no administration in time range)     Initial Impression / Assessment and Plan / ED Course  I have reviewed the triage vital signs and the nursing notes.  Pertinent labs & imaging results that were available during my care of the patient were reviewed by me and considered in my medical decision making (see chart for details).  Clinical Course as of Nov 21 1341  Tue Nov 22, 2018  1246 Up from 15 2 days ago  WBC(!): 18.7 [AH]  1310 Temp: 99.8 F (37.7 C) [AH]  1310 Protein(!): 100 [AH]  1310 Hgb urine dipstick(!): TRACE [AH]  1312 Lactic Acid, Venous(!!): 2.5 [AH]    Clinical Course User Index [AH] Margarita Mail, PA-C      Spoke with the patient's daughter Octaviano Glow who lives with the family.  She states that the patient is extremely hard of hearing but does know who he is and who his wife is.  She does not think that he has any current dementia.  I was able to confirm EMS story as accurate.  She is aware that we plan on admitting her father.  2:54 PM  Patient is more alert but still confused.  CC: Altered mental status VS:  Vitals:   11/22/18 1300 11/22/18 1315 11/22/18 1401 11/22/18 1415  BP: (!) 162/63 (!) 153/67 (!) 137/53  135/67  Pulse: (!) 53 (!) 58 71 64  Resp: (!) 21 20 20 20   Temp:      TempSrc:      SpO2: 95% 91% 92% 93%  Weight:      Height:        MC:NOBSJGG is gathered by EMS and review of EMR and Daughter via phone. DDX:The differential diagnosis for AMS is extensive and includes, but is not limited to: drug overdose - opioids, alcohol, sedatives, antipsychotics, drug withdrawal, others; Metabolic: hypoxia, hypoglycemia, hyperglycemia, hypercalcemia, hypernatremia, hyponatremia, uremia, hepatic encephalopathy, hypothyroidism, hyperthyroidism, vitamin B12 or thiamine deficiency, carbon monoxide poisoning, Wilson's disease, Lactic acidosis, DKA/HHOS; Infectious: meningitis, encephalitis, bacteremia/sepsis, urinary tract infection, pneumonia, neurosyphilis; Structural: Space-occupying lesion, (brain tumor, subdural hematoma, hydrocephalus,); Vascular: stroke, subarachnoid hemorrhage, coronary ischemia, hypertensive encephalopathy, CNS vasculitis, thrombotic thrombocytopenic purpura, disseminated intravascular coagulation, hyperviscosity; Psychiatric: Schizophrenia, depression; Other: Seizure, hypothermia, heat stroke, ICU psychosis, dementia -"sundowning." Labs: I reviewed the labs which show trace hematuria and proteinuria.  Mild lactic acidosis, leukocytosis with left shift, CMP shows chronic renal insufficiency.  His BUN and creatinine appear to be at baseline. Imaging: I personally reviewed the images (PA and lateral chest film, and CT head without contrast) which show(s) no evidence of infection, solid lesion or infiltrate.  Head CT shows signs of chronic microvascular ischemic change without acute infarct or hemorrhage. EKG: A. Fib with paced rhythm, unchanged from previous tracing. MDM: 85 year old male with altered mental status. He is currently being treated as a sepsis patient got early  broad-spectrum antibiotics.  He got a bolus of fluid.  We will recheck his lactic acid.  He appears to have improving  mentation and has asked for water but is still confused.  Patient will be admitted for toxic metabolic encephalopathy.  Not appear to have evidence of stroke.  Do not see evidence of pneumonia or urinary tract infection.  I spoken with Dr. Sherral Hammers who will admit the patient. Patient disposition: Admit Patient condition: Improved/ stable. The patient appears reasonably stabilized for admission considering the current resources, flow, and capabilities available in the ED at this time, and I doubt any other [facility] requiring further screening and/or treatment in the ED prior to admission.   Final Clinical Impressions(s) / ED Diagnoses   Final diagnoses:  None    ED Discharge Orders    None       Margarita Mail, PA-C 11/22/18 1513    Carmin Muskrat, MD 11/23/18 1939

## 2018-11-22 NOTE — ED Notes (Signed)
Pt given water to drink; tolerated well

## 2018-11-22 NOTE — ED Triage Notes (Signed)
Pt from home via ems; more altered over past week; alert to person only; new onset of inability to stand or walk; pt currently getting antibiotics for UTI; given 500 NS PTA; denied n/v;  Per pt's wife, pt had a fall a few days ago, c/o pain to back and neck; pt head of hearing  RR 24 HR 140 afib CBG 114 96% RA 160/66 manual; 91/42 automated T 98.1 F temporal

## 2018-11-22 NOTE — H&P (Signed)
[facility] History and Physical  [name] YDX:[id number] DOB: 06-12-1925 DOA: 11/22/2018  Referring physician:  PCP: [name], MD   Chief Complaint: Altered mental status/sepsis?  HPI: [name] is a 85 y.o. WM PMHx Depression, CVA, COPD, tobacco abuse (current) HTN, chronic diastolic CHF, persistent A. fib, aortic stenosis HLD, nephrolithiasis, PVD, renal cell cancer, CKD stage III    Patient brought in by EMS for altered mental status.  There is a level 5 caveat due to the patient's condition.  EMS reports that the patient is coming from home where he lives with his wife.  Apparently he was recently diagnosed with a urinary tract infection (started on Keflex on 3/18).  Patient has had progressively worsening decline in his mental status and is currently only oriented to self.  He has been increasingly weak and somnolent. He had one fall due to weakness earlier this week.      Review of Systems:  Constitutional:  No weight loss, night sweats, Fevers, chills, fatigue.  HEENT:  No headaches, Difficulty swallowing,Tooth/dental problems,Sore throat,  No sneezing, itching, ear ache, nasal congestion, post nasal drip,  Cardio-vascular:  No chest pain, Orthopnea, PND, swelling in lower extremities, anasarca, dizziness, palpitations  GI:  No heartburn, indigestion, abdominal pain, nausea, vomiting, diarrhea, change in bowel habits, loss of appetite  Resp:  No shortness of breath with exertion or at rest. No excess mucus, no productive cough, No non-productive cough, No coughing up of blood.No change in color of mucus.No wheezing.No chest wall deformity  Skin:  no rash or lesions.  GU:  no dysuria, change in color of urine, no urgency or frequency. No flank pain.  Musculoskeletal:  No joint pain or swelling. No decreased range of motion. No back pain.  Psych:  No change in mood or affect. No depression or anxiety. No memory loss.   Past Medical History:   Diagnosis Date  . ALLERGIC RHINITIS 09/29/2007  . Aortic stenosis 02/05/2017  . Arthritis    "was in his knees" (01/19/2018)  . BACK PAIN 01/31/2009  . BENIGN PROSTATIC HYPERTROPHY 05/12/2007  . CHEST PAIN 01/31/2008  . CHRONIC OBSTRUCTIVE PULMONARY DISEASE, ACUTE EXACERBATION 08/17/2007  . CONJUNCTIVITIS, ALLERGIC, CHRONIC 03/24/2010  . CONSTIPATION 02/19/2010  . COPD 05/12/2007  . CVA (cerebral vascular accident) ([place]) 05/03/2011  . GERD 02/23/2010  . GOUT 07/04/2010  . Heart murmur   . History of kidney stones    "passed it"  . History of stomach ulcers   . HYPERLIPIDEMIA 05/12/2007  . HYPERTENSION 05/12/2007  . NEPHROLITHIASIS, HX OF   . OBESITY 05/12/2007  . PEPTIC ULCER DISEASE 05/12/2007  . PERIPHERAL VASCULAR DISEASE 05/12/2007  . Personal history of unspecified circulatory disease 05/12/2007  . PLANTAR FASCIITIS, RIGHT 09/27/2008  . RENAL CELL CANCER 05/12/2007  . RENAL INSUFFICIENCY 08/17/2007  . Sepsis, unspecified organism ([place]) 11/22/2018  . SKIN LESION 11/07/2010  . VERTIGO 09/29/2007  . WRIST PAIN, RIGHT 02/26/2009   Past Surgical History:  Procedure Laterality Date  . ARTERY BIOPSY Bilateral 10/27/2017   Procedure: BILATERAL TEMPORAL ARTERY BIOPSY;  Surgeon: Serafina Mitchell, MD;  Location: [facility] OR;  Service: Vascular;  Laterality: Bilateral;  . CARDIOVERSION N/A 03/21/2018   Procedure: CARDIOVERSION;  Surgeon: Skeet Latch, MD;  Location: [facility];  Service: Cardiovascular;  Laterality: N/A;  . CATARACT EXTRACTION W/ INTRAOCULAR LENS  IMPLANT, BILATERAL Bilateral   . FEMORAL-POPLITEAL BYPASS GRAFT Bilateral   . INNER EAR SURGERY Bilateral    "had it twice in  one of his ears; once in the other"  . JOINT REPLACEMENT    . NEPHRECTOMY    . NEPHROURETERECTOMY    . TOTAL KNEE ARTHROPLASTY Left 06/2007  . TOTAL KNEE ARTHROPLASTY Right    Social History:  reports that he has been smoking cigarettes. He has a 40.00 pack-year smoking history. He has never used smokeless tobacco.  He reports that he does not drink alcohol or use drugs.  Allergies  Allergen Reactions  . Sulfonamide Derivatives Rash    Family History  Problem Relation Age of Onset  . Cancer Mother        colon cancer  . Heart disease Brother   . Alcohol abuse Brother   . Cancer Daughter        bladder cancer  . Cancer Other        grandson leukemia and BMT  . Cancer Other        sibling with pancreatic cancer     Prior to Admission medications   Medication Sig Start Date End Date Taking? Authorizing Provider  allopurinol (ZYLOPRIM) 100 MG tablet Take 100 mg by mouth daily.    Yes [provider]  amLODipine (NORVASC) 5 MG tablet TAKE 1 TABLET (5 MG TOTAL) BY MOUTH DAILY. 12/14/17  Yes [name], MD  cephALEXin (KEFLEX) 500 MG capsule Take 1 capsule (500 mg total) by mouth 3 (three) times daily for 10 days. 11/15/18 11/25/18 Yes [name], MD  cetirizine (ZYRTEC) 10 MG tablet Take 10 mg by mouth daily as needed for allergies.    Yes [provider]  citalopram (CELEXA) 10 MG tablet Take 1 tablet (10 mg total) by mouth daily. 11/15/18 11/15/19 Yes [name], MD  docusate sodium (COLACE) 100 MG capsule Take 200 mg by mouth daily as needed for mild constipation.    Yes [provider]  ELIQUIS 2.5 MG TABS tablet TAKE 1 TABLET(2.5 MG) BY MOUTH TWICE DAILY Patient taking differently: Take 2.5 mg by mouth 2 (two) times daily.  08/26/18  Yes Camnitz, Will Hassell Done, MD  furosemide (LASIX) 20 MG tablet Take 2 tablets (40 mg total) by mouth daily. 01/23/18  Yes Elgergawy, Silver Huguenin, MD  meclizine (ANTIVERT) 12.5 MG tablet TAKE 1 TABLET FOUR TIMES DAILY AS NEEDED Patient taking differently: Take 12.5 mg by mouth daily.  05/13/18  Yes [name], MD  megestrol (MEGACE) 40 MG tablet Take 1 tablet (40 mg total) by mouth daily. 11/15/18  Yes [name], MD  mirtazapine (REMERON) 15 MG tablet Take 15 mg by mouth at bedtime.   Yes [provider]  omeprazole  (PRILOSEC) 40 MG capsule TAKE 1 CAPSULE EVERY DAY Patient taking differently: Take 40 mg by mouth daily.  10/04/18  Yes [name], MD  polyethylene glycol [facility] / Floria Raveling) packet Take 17 g by mouth daily as needed for mild constipation.   Yes [provider]  pravastatin (PRAVACHOL) 40 MG tablet Take 40 mg by mouth at bedtime.    Yes [provider]  predniSONE (DELTASONE) 5 MG tablet Take 5 mg by mouth daily with breakfast.  06/23/18   [provider]     Consultants:  None  Procedures/Significant Events:  3/24 CT head: Negative acute process -Atrophy with extensive supratentorial small vessel disease  3/24 PCXR: Nondiagnostic     I have personally reviewed and interpreted all radiology studies and my findings are as above.   VENTILATOR SETTINGS: None   Cultures 3/24  blood pending 3/24 urine pending    Antimicrobials: Anti-infectives (From admission, onward)   Start     Ordered Stop   11/23/18 1200  ceFEPIme (MAXIPIME) 1 g in sodium chloride 0.9 % 100 mL IVPB     11/22/18 1235     11/22/18 1600  ceFEPIme (MAXIPIME) 2 g in sodium chloride 0.9 % 100 mL IVPB  Status:  Discontinued     11/22/18 1559 11/22/18 1601   11/22/18 1245  vancomycin (VANCOCIN) 1,500 mg in sodium chloride 0.9 % 500 mL IVPB     11/22/18 1234     11/22/18 1238  vancomycin variable dose per unstable renal function (pharmacist dosing)     11/22/18 1238     11/22/18 1215  ceFEPIme (MAXIPIME) 2 g in sodium chloride 0.9 % 100 mL IVPB     11/22/18 1212 11/22/18 1313   11/22/18 1215  metroNIDAZOLE (FLAGYL) IVPB 500 mg     11/22/18 1212 11/22/18 1542   11/22/18 1215  vancomycin (VANCOCIN) IVPB 1000 mg/200 mL premix  Status:  Discontinued     11/22/18 1212 11/22/18 1234       Devices   LINES / TUBES:      Continuous Infusions: . [START ON 11/23/2018] ceFEPime (MAXIPIME) IV    . vancomycin 1,500 mg (11/22/18 1415)    Physical Exam: Vitals:   11/22/18 1300  11/22/18 1315 11/22/18 1401 11/22/18 1415  BP: (!) 162/63 (!) 153/67 (!) 137/53 135/67  Pulse: (!) 53 (!) 58 71 64  Resp: (!) 21 20 20 20   Temp:      TempSrc:      SpO2: 95% 91% 92% 93%  Weight:      Height:        Wt Readings from Last 3 Encounters:  11/22/18 74.4 kg  10/05/18 74.4 kg  07/15/18 75.2 kg    General: A/O x3 (does not know when), no acute respiratory distress Eyes: negative scleral hemorrhage, negative anisocoria, negative icterus ENT: Negative Runny nose, negative gingival bleeding, mucosal extremely dry Neck:  Negative scars, masses, torticollis, lymphadenopathy, JVD Lungs: Clear to auscultation bilaterally without wheezes or crackles Cardiovascular: Irregular irregular rhythm and rate, without murmur gallop or rub normal S1 and S2 Abdomen: negative abdominal pain, nondistended, positive soft, bowel sounds, no rebound, no ascites, no appreciable mass Extremities: No significant cyanosis, clubbing, or edema bilateral lower extremities Skin: Negative rashes, lesions, ulcers, tenting (consistent with dehydration) Psychiatric:  Negative depression, negative anxiety, negative fatigue, negative mania  Central nervous system:  Cranial nerves II through XII intact, tongue/uvula midline, all extremities muscle strength 5/5, sensation intact throughout,  negative dysarthria, negative expressive aphasia, negative receptive aphasia.        Labs on Admission:  Basic Metabolic Panel: Recent Labs  Lab 11/22/18 1225  NA 136  K 4.0  CL 107  CO2 20*  GLUCOSE 100*  BUN 46*  CREATININE 1.80*  CALCIUM 10.7*   Liver Function Tests: Recent Labs  Lab 11/22/18 1225  AST 28  ALT 19  ALKPHOS 91  BILITOT 0.8  PROT 6.4*  ALBUMIN 2.2*   No results for input(s): LIPASE, AMYLASE in the last 168 hours. No results for input(s): AMMONIA in the last 168 hours. CBC: Recent Labs  Lab 11/22/18 1225  WBC 18.7*  NEUTROABS 12.2*  HGB 14.1  HCT 44.0  MCV 94.8  PLT 293    Cardiac Enzymes: No results for input(s): CKTOTAL, CKMB, CKMBINDEX, TROPONINI in the last 168 hours.  BNP (last 3 results)  Recent Labs    11/27/17 0714 01/19/18 1444  BNP 594.0* 226.0*    ProBNP (last 3 results) No results for input(s): PROBNP in the last 8760 hours.  CBG: No results for input(s): GLUCAP in the last 168 hours.  Radiological Exams on Admission: Ct Head Wo Contrast  Result Date: 11/22/2018 CLINICAL DATA:  Confusion and disorientation EXAM: CT HEAD WITHOUT CONTRAST TECHNIQUE: Contiguous axial images were obtained from the base of the skull through the vertex without intravenous contrast. COMPARISON:  Head CT October 25, 2017 and brain MRI October 26, 2017 FINDINGS: Brain: There is moderate diffuse atrophy. There is no intracranial mass, hemorrhage, extra-axial fluid collection, or midline shift. There is extensive small vessel disease throughout the centra semiovale bilaterally. There is small vessel disease in each internal capsule. No acute appearing infarct is demonstrable. Vascular: There is no appreciable hyperdense vessel. There is calcification in each carotid siphon region. Skull: Bony calvarium appears intact. Sinuses/Orbits: There is mucosal thickening in several ethmoid air cells. Other visualized paranasal sinuses are clear. Orbits appear symmetric bilaterally. Other: Mastoid air cells are clear. IMPRESSION: Atrophy with extensive supratentorial small vessel disease. No acute infarct evident. No mass or hemorrhage. There are foci of arterial vascular calcification. There is mucosal thickening in several ethmoid air cells. Electronically Signed   By: Lowella Grip III M.D.   On: 11/22/2018 13:46   Dg Chest Port 1 View  Result Date: 11/22/2018 CLINICAL DATA:  Altered mental status EXAM: PORTABLE CHEST 1 VIEW COMPARISON:  10/28/2018 FINDINGS: The heart size and mediastinal contours are within normal limits. Both lungs are clear. The visualized skeletal structures  are unremarkable. IMPRESSION: No active disease. Electronically Signed   By: Kathreen Devoid   On: 11/22/2018 12:30    EKG: Independently reviewed.  Currently in A. fib  Assessment/Plan Principal Problem:   Sepsis, unspecified organism [facility]) Active Problems:   Hyperlipidemia   Essential hypertension   PERIPHERAL VASCULAR DISEASE   COPD (chronic obstructive pulmonary disease) (HCC)   CKD (chronic kidney disease), stage III (HCC)   History of CVA (cerebrovascular accident)   Smoker unmotivated to quit   Aortic stenosis   Temporal arteritis (HCC)   Elevated lactic acid level   Persistent atrial fibrillation   Chronic diastolic CHF (congestive heart failure) (HCC)   Bradycardia   Sepsis unspecified organism - Patient meets guidelines for sepsis upon admission WBC> 12, RR> 30, in addition lactic acid 2.5. - Source most likely from UTI but not 100% sure therefore will continue with broad-spectrum antibiotic, narrow as cultures return. -Patient dehydrated as evidenced by his mucosal dryness and tenting of the skin hydrate aggressively.  Normal saline 123ml/hr - Blood/urine culture pending  Chronic Diastolic CHF/Aortic stenosis/PHTN - Strict in and out -Daily weight -Amlodipine 5 mg daily -Hold home Lasix - Monitor closely for fluid overload - Last echocardiogram 01/20/2018:- EF 50% to 55%- (grade 1 diastolic dysfunction). - Aortic valve: Valve mobility was restricted. There was moderate   stenosis. There was mild to moderate regurgitation. - Left atrium:  mildly to moderately dilated. - Pulmonary arteries: PA peak pressure: 37 mm Hg (S).  Persistent atrial fibrillation/Bradycardia - See diastolic CHF - Eliquis 2.5 mg twice daily  HLD -Lipid panel pending -Patient not on any medication at home.  COPD  - DuoNeb as needed  CKD stage III (baseline most likely 1.8s) - Monitor closely  Tobacco abuse - Will again address smoking cessation when more stable, given his age and  previous failed attempts unlikely  to succeed.  Temporal arteritis - Prednisone 5 mg daily.  Per pharmacy notes patient self [place] on 3/23 but will restart.  When patient stable can addressed with patient.  Failure to thrive -Continue Megace  Goals of care - 3/24 palliative care consult placed: Discuss change of CODE STATUS to DNR, advanced directives   Code Status: Full (DVT Prophylaxis: Eliquis Family Communication: ED spoke with patient's daughter on telephone Disposition Plan: TBD   Data Reviewed: Care during the described time interval was provided by me .  I have reviewed this patient's available data, including medical history, events of note, physical examination, and all test results as part of my evaluation.   Time spent: 60 min  [place], [facility] Pager [phone]

## 2018-11-22 NOTE — Progress Notes (Signed)
Patient arrived to unit in NAD. VS stable.  

## 2018-11-22 NOTE — ED Notes (Signed)
EDP notified of elevated lactic 

## 2018-11-22 NOTE — Progress Notes (Signed)
Pharmacy Antibiotic Note  [name] is a 85 y.o. male admitted on 11/22/2018 with sepsis.  Pharmacy has been consulted for Vanc and Cefepime dosing.  Plan: Vanc 1500 mg IV x1, then variable dosing by pharmacy based on renal dysfunction. Cefepime 2 gms IV x 1, then 1 gram IV q24hr Monitor renal function, C&S, and vanc levels as indicated  Height: 5\' 5"  (165.1 cm) Weight: 164 lb 0.4 oz (74.4 kg) IBW/kg (Calculated) : 61.5  No data recorded.  Recent Labs  Lab 11/15/18 1505  WBC 15.7*  CREATININE 2.11*    Estimated Creatinine Clearance: 21.1 mL/min (A) (by C-G formula based on SCr of 2.11 mg/dL (H)).    Allergies  Allergen Reactions  . Sulfonamide Derivatives Rash    Antimicrobials this admission: Vanc 3/24 >> Cefepime 3/24 >>   Thank you for allowing pharmacy to be a part of this patient's care.  Alanda Slim, PharmD, [facility] Clinical Pharmacist Please see AMION for all Pharmacists' Contact Phone Numbers 11/22/2018, 12:41 PM

## 2018-11-22 NOTE — ED Notes (Signed)
ED TO INPATIENT HANDOFF REPORT  ED Nurse Name and Phone #: Jorge Ny  S Name/Age/Gender [name] 85 y.o. male Room/Bed: 018C/018C  Code Status   Code Status: Full Code  Home/SNF/Other Home Patient oriented to: self, place and situation Is this baseline? No   Triage Complete: Triage complete  Chief Complaint Altered, UTI, ? Sepsis?  Triage Note Pt from home via ems; more altered over past week; alert to person only; new onset of inability to stand or walk; pt currently getting antibiotics for UTI; given 500 NS PTA; denied n/v;  Per pt's wife, pt had a fall a few days ago, c/o pain to back and neck; pt head of hearing  RR 24 HR 140 afib CBG 114 96% RA 160/66 manual; 91/42 automated T 98.1 F temporal    Allergies Allergies  Allergen Reactions  . Sulfonamide Derivatives Rash    Level of Care/Admitting Diagnosis ED Disposition    ED Disposition Condition [facility] Area: [place] [[id number]]  Level of Care: Telemetry Medical [104]  Diagnosis: Sepsis, unspecified organism [facility]) [[id number]]  Admitting Physician: Allie Bossier [[id number]]  Attending Physician: Allie Bossier [[id number]]  Estimated length of stay: 5 - 7 days  Certification:: I certify this patient will need inpatient services for at least 2 midnights  PT Class (Do Not Modify): Inpatient [101]  PT Acc Code (Do Not Modify): Private [1]       B Medical/Surgery History Past Medical History:  Diagnosis Date  . ALLERGIC RHINITIS 09/29/2007  . Aortic stenosis 02/05/2017  . Arthritis    "was in his knees" (01/19/2018)  . BACK PAIN 01/31/2009  . BENIGN PROSTATIC HYPERTROPHY 05/12/2007  . CHEST PAIN 01/31/2008  . CHRONIC OBSTRUCTIVE PULMONARY DISEASE, ACUTE EXACERBATION 08/17/2007  . CONJUNCTIVITIS, ALLERGIC, CHRONIC 03/24/2010  . CONSTIPATION 02/19/2010  . COPD 05/12/2007  . CVA (cerebral vascular accident) ([place]) 05/03/2011  . GERD 02/23/2010  . GOUT 07/04/2010  . Heart  murmur   . History of kidney stones    "passed it"  . History of stomach ulcers   . HYPERLIPIDEMIA 05/12/2007  . HYPERTENSION 05/12/2007  . NEPHROLITHIASIS, HX OF   . OBESITY 05/12/2007  . PEPTIC ULCER DISEASE 05/12/2007  . PERIPHERAL VASCULAR DISEASE 05/12/2007  . Personal history of unspecified circulatory disease 05/12/2007  . PLANTAR FASCIITIS, RIGHT 09/27/2008  . RENAL CELL CANCER 05/12/2007  . RENAL INSUFFICIENCY 08/17/2007  . Sepsis, unspecified organism ([place]) 11/22/2018  . SKIN LESION 11/07/2010  . VERTIGO 09/29/2007  . WRIST PAIN, RIGHT 02/26/2009   Past Surgical History:  Procedure Laterality Date  . ARTERY BIOPSY Bilateral 10/27/2017   Procedure: BILATERAL TEMPORAL ARTERY BIOPSY;  Surgeon: Serafina Mitchell, MD;  Location: [facility] OR;  Service: Vascular;  Laterality: Bilateral;  . CARDIOVERSION N/A 03/21/2018   Procedure: CARDIOVERSION;  Surgeon: Skeet Latch, MD;  Location: [place];  Service: Cardiovascular;  Laterality: N/A;  . CATARACT EXTRACTION W/ INTRAOCULAR LENS  IMPLANT, BILATERAL Bilateral   . FEMORAL-POPLITEAL BYPASS GRAFT Bilateral   . INNER EAR SURGERY Bilateral    "had it twice in one of his ears; once in the other"  . JOINT REPLACEMENT    . NEPHRECTOMY    . NEPHROURETERECTOMY    . TOTAL KNEE ARTHROPLASTY Left 06/2007  . TOTAL KNEE ARTHROPLASTY Right      A IV Location/Drains/Wounds Patient Lines/Drains/Airways Status   Active Line/Drains/Airways    Name:   Placement date:   Placement time:  Site:   Days:   Peripheral IV 11/22/18 Left Forearm   11/22/18    1203    Forearm   less than 1   Peripheral IV 11/22/18 Right;Posterior Forearm   11/22/18    1238    Forearm   less than 1   Incision (Closed) 10/27/17 Face Right   10/27/17    1049     391   Incision (Closed) 10/27/17 Face Left   10/27/17    1110     391   Pressure Injury 01/19/18 Stage II -  Partial thickness loss of dermis presenting as a shallow open ulcer with a red, pink wound bed without slough.    01/19/18    1945     307          Intake/Output Last 24 hours  Intake/Output Summary (Last 24 hours) at 11/22/2018 1611 Last data filed at 11/22/2018 1542 Gross per 24 hour  Intake 1200 ml  Output -  Net 1200 ml    Labs/Imaging Results for orders placed or performed during the hospital encounter of 11/22/18 (from the past 48 hour(s))  Lactic acid, plasma     Status: Abnormal   Collection Time: 11/22/18 12:25 PM  Result Value Ref Range   Lactic Acid, Venous 2.5 (HH) 0.5 - 1.9 mmol/L    Comment: CRITICAL RESULT CALLED TO, READ BACK BY AND VERIFIED WITH: Daquon Greenleaf,H RN @1259  ON [id number] BY FLEMINGS Performed at [facility], [address] [place], [place] [address]   Comprehensive metabolic panel     Status: Abnormal   Collection Time: 11/22/18 12:25 PM  Result Value Ref Range   Sodium 136 135 - 145 mmol/L   Potassium 4.0 3.5 - 5.1 mmol/L   Chloride 107 98 - 111 mmol/L   CO2 20 (L) 22 - 32 mmol/L   Glucose, Bld 100 (H) 70 - 99 mg/dL   BUN 46 (H) 8 - 23 mg/dL   Creatinine, Ser 1.80 (H) 0.61 - 1.24 mg/dL   Calcium 10.7 (H) 8.9 - 10.3 mg/dL   Total Protein 6.4 (L) 6.5 - 8.1 g/dL   Albumin 2.2 (L) 3.5 - 5.0 g/dL   AST 28 15 - 41 U/L   ALT 19 0 - 44 U/L   Alkaline Phosphatase 91 38 - 126 U/L   Total Bilirubin 0.8 0.3 - 1.2 mg/dL   GFR calc non Af Amer 32 (L) >60 mL/min   GFR calc Af Amer 37 (L) >60 mL/min   Anion gap 9 5 - 15    Comment: Performed at [facility], [place] [address], [place], [place] [address]  CBC WITH DIFFERENTIAL     Status: Abnormal   Collection Time: 11/22/18 12:25 PM  Result Value Ref Range   WBC 18.7 (H) 4.0 - 10.5 K/uL   RBC 4.64 4.22 - 5.81 MIL/uL   Hemoglobin 14.1 13.0 - 17.0 g/dL   HCT 44.0 39.0 - 52.0 %   MCV 94.8 80.0 - 100.0 fL   MCH 30.4 26.0 - 34.0 pg   MCHC 32.0 30.0 - 36.0 g/dL   RDW 14.8 11.5 - 15.5 %   Platelets 293 150 - 400 K/uL   nRBC 0.0 0.0 - 0.2 %   Neutrophils Relative % 65 %   Neutro Abs 12.2 (H) 1.7 - 7.7  K/uL   Lymphocytes Relative 25 %   Lymphs Abs 4.6 (H) 0.7 - 4.0 K/uL   Monocytes Relative 8 %   Monocytes Absolute  1.5 (H) 0.1 - 1.0 K/uL   Eosinophils Relative 1 %   Eosinophils Absolute 0.2 0.0 - 0.5 K/uL   Basophils Relative 0 %   Basophils Absolute 0.0 0.0 - 0.1 K/uL   Immature Granulocytes 1 %   Abs Immature Granulocytes 0.12 (H) 0.00 - 0.07 K/uL    Comment: Performed at [facility] [address] [place], [place] [address]  Urinalysis, Routine w reflex microscopic     Status: Abnormal   Collection Time: 11/22/18 12:32 PM  Result Value Ref Range   Color, Urine YELLOW YELLOW   APPearance CLEAR CLEAR   Specific Gravity, Urine 1.020 1.005 - 1.030   pH 6.0 5.0 - 8.0   Glucose, UA NEGATIVE NEGATIVE mg/dL   Hgb urine dipstick TRACE (A) NEGATIVE   Bilirubin Urine NEGATIVE NEGATIVE   Ketones, ur NEGATIVE NEGATIVE mg/dL   Protein, ur 100 (A) NEGATIVE mg/dL   Nitrite NEGATIVE NEGATIVE   Leukocytes,Ua NEGATIVE NEGATIVE    Comment: Performed at [facility] [address] [place], [place] [address]  Urinalysis, Microscopic (reflex)     Status: None   Collection Time: 11/22/18 12:32 PM  Result Value Ref Range   RBC / HPF 0-5 0 - 5 RBC/hpf   WBC, UA 0-5 0 - 5 WBC/hpf   Bacteria, UA NONE SEEN NONE SEEN   Squamous Epithelial / LPF 0-5 0 - 5    Comment: Performed at [facility], [place] [address] [place], [place] [address]  Lactic acid, plasma     Status: None   Collection Time: 11/22/18  3:00 PM  Result Value Ref Range   Lactic Acid, Venous 1.4 0.5 - 1.9 mmol/L    Comment: Performed at [facility] [address] [place], [place] [address]   Ct Head Wo Contrast  Result Date: 11/22/2018 CLINICAL DATA:  Confusion and disorientation EXAM: CT HEAD WITHOUT CONTRAST TECHNIQUE: Contiguous axial images were obtained from the base of the skull through the vertex without intravenous contrast. COMPARISON:  Head CT October 25, 2017 and brain MRI October 26, 2017  FINDINGS: Brain: There is moderate diffuse atrophy. There is no intracranial mass, hemorrhage, extra-axial fluid collection, or midline shift. There is extensive small vessel disease throughout the centra semiovale bilaterally. There is small vessel disease in each internal capsule. No acute appearing infarct is demonstrable. Vascular: There is no appreciable hyperdense vessel. There is calcification in each carotid siphon region. Skull: Bony calvarium appears intact. Sinuses/Orbits: There is mucosal thickening in several ethmoid air cells. Other visualized paranasal sinuses are clear. Orbits appear symmetric bilaterally. Other: Mastoid air cells are clear. IMPRESSION: Atrophy with extensive supratentorial small vessel disease. No acute infarct evident. No mass or hemorrhage. There are foci of arterial vascular calcification. There is mucosal thickening in several ethmoid air cells. Electronically Signed   By: Lowella Grip III M.D.   On: 11/22/2018 13:46   Dg Chest Port 1 View  Result Date: 11/22/2018 CLINICAL DATA:  Altered mental status EXAM: PORTABLE CHEST 1 VIEW COMPARISON:  10/28/2018 FINDINGS: The heart size and mediastinal contours are within normal limits. Both lungs are clear. The visualized skeletal structures are unremarkable. IMPRESSION: No active disease. Electronically Signed   By: Kathreen Devoid   On: 11/22/2018 12:30    Pending Labs Unresulted Labs (From admission, onward)    Start     Ordered   11/23/18 0500  Protime-INR  Tomorrow morning,   R     11/22/18  1559   11/23/18 0500  Cortisol-am, blood  Tomorrow morning,   R     11/22/18 1559   11/23/18 0500  Procalcitonin  Tomorrow morning,   R     11/22/18 1559   11/23/18 1157  Basic metabolic panel  Daily,   R     11/22/18 1559   11/23/18 0500  Magnesium  Daily,   R     11/22/18 1559   11/23/18 0500  CBC  Daily,   R     11/22/18 1559   11/22/18 1540  Magnesium  ONCE - STAT,   STAT     11/22/18 1541   11/22/18 1212  Urine  culture  ONCE - STAT,   STAT     11/22/18 1212   11/22/18 1211  Blood Culture (routine x 2)  BLOOD CULTURE X 2,   STAT     11/22/18 1212          Vitals/Pain Today's Vitals   11/22/18 1401 11/22/18 1415 11/22/18 1445 11/22/18 1530  BP: (!) 137/53 135/67 (!) 144/82 137/69  Pulse: 71 64 (!) 58 60  Resp: 20 20 20  (!) 22  Temp:      TempSrc:      SpO2: 92% 93% 94% 95%  Weight:      Height:        Isolation Precautions No active isolations  Medications Medications  vancomycin (VANCOCIN) 1,500 mg in sodium chloride 0.9 % 500 mL IVPB (1,500 mg Intravenous New Bag/Given 11/22/18 1415)  ceFEPIme (MAXIPIME) 1 g in sodium chloride 0.9 % 100 mL IVPB (has no administration in time range)  vancomycin variable dose per unstable renal function (pharmacist dosing) (has no administration in time range)  0.9 %  sodium chloride infusion ( Intravenous New Bag/Given 11/22/18 1610)  acetaminophen (TYLENOL) tablet 650 mg (has no administration in time range)    Or  acetaminophen (TYLENOL) suppository 650 mg (has no administration in time range)  traMADol (ULTRAM) tablet 50 mg (has no administration in time range)  traZODone (DESYREL) tablet 25 mg (has no administration in time range)  aspirin EC tablet 81 mg (has no administration in time range)  allopurinol (ZYLOPRIM) tablet 100 mg (has no administration in time range)  amLODipine (NORVASC) tablet 5 mg (has no administration in time range)  citalopram (CELEXA) tablet 10 mg (has no administration in time range)  docusate sodium (COLACE) capsule 200 mg (has no administration in time range)  apixaban (ELIQUIS) tablet 2.5 mg (has no administration in time range)  megestrol (MEGACE) tablet 40 mg (has no administration in time range)  mirtazapine (REMERON) tablet 15 mg (has no administration in time range)  pantoprazole (PROTONIX) EC tablet 80 mg (has no administration in time range)  polyethylene glycol (MIRALAX / GLYCOLAX) packet 17 g (has no  administration in time range)  pravastatin (PRAVACHOL) tablet 40 mg (has no administration in time range)  predniSONE (DELTASONE) tablet 5 mg (has no administration in time range)  ceFEPIme (MAXIPIME) 2 g in sodium chloride 0.9 % 100 mL IVPB (0 g Intravenous Stopped 11/22/18 1313)  metroNIDAZOLE (FLAGYL) IVPB 500 mg (0 mg Intravenous Stopped 11/22/18 1542)  sodium chloride 0.9 % bolus 1,000 mL (0 mLs Intravenous Stopped 11/22/18 1340)    Mobility non-ambulatory High fall risk   Focused Assessments Neuro Assessment Handoff:  Swallow screen pass?  Cardiac Rhythm: Atrial fibrillation       Neuro Assessment: Exceptions to WDL Neuro Checks:      Last Documented NIHSS  Modified Score:   Has TPA been given? No If patient is a Neuro Trauma and patient is going to OR before floor call report to [place] nurse: [phone] or [phone]     R Recommendations: See Admitting Provider Note  Report given to:   Additional Notes:

## 2018-11-23 DIAGNOSIS — A419 Sepsis, unspecified organism: Principal | ICD-10-CM

## 2018-11-23 DIAGNOSIS — N390 Urinary tract infection, site not specified: Secondary | ICD-10-CM

## 2018-11-23 LAB — URINE CULTURE: Culture: NO GROWTH

## 2018-11-23 LAB — BASIC METABOLIC PANEL
Anion gap: 9 (ref 5–15)
BUN: 43 mg/dL — ABNORMAL HIGH (ref 8–23)
CO2: 21 mmol/L — ABNORMAL LOW (ref 22–32)
Calcium: 10.4 mg/dL — ABNORMAL HIGH (ref 8.9–10.3)
Chloride: 110 mmol/L (ref 98–111)
Creatinine, Ser: 1.68 mg/dL — ABNORMAL HIGH (ref 0.61–1.24)
GFR calc non Af Amer: 35 mL/min — ABNORMAL LOW (ref >60)
GFR, EST AFRICAN AMERICAN: 40 mL/min — AB (ref >60)
Glucose, Bld: 80 mg/dL (ref 70–99)
Potassium: 4.2 mmol/L (ref 3.5–5.1)
Sodium: 140 mmol/L (ref 135–145)

## 2018-11-23 LAB — PROTIME-INR
INR: 1.4 — ABNORMAL HIGH (ref 0.8–1.2)
Prothrombin Time: 17.2 seconds — ABNORMAL HIGH (ref 11.4–15.2)

## 2018-11-23 LAB — CBC
HCT: 40.2 % (ref 39.0–52.0)
Hemoglobin: 13.5 g/dL (ref 13.0–17.0)
MCH: 31.5 pg (ref 26.0–34.0)
MCHC: 33.6 g/dL (ref 30.0–36.0)
MCV: 93.9 fL (ref 80.0–100.0)
NRBC: 0 % (ref 0.0–0.2)
Platelets: 285 10*3/uL (ref 150–400)
RBC: 4.28 MIL/uL (ref 4.22–5.81)
RDW: 15 % (ref 11.5–15.5)
WBC: 17 10*3/uL — AB (ref 4.0–10.5)

## 2018-11-23 LAB — PROCALCITONIN: Procalcitonin: <0.1 ng/mL

## 2018-11-23 LAB — MAGNESIUM: Magnesium: 1.9 mg/dL (ref 1.7–2.4)

## 2018-11-23 LAB — CORTISOL-AM, BLOOD: Cortisol - AM: 13.8 ug/dL (ref 6.7–22.6)

## 2018-11-23 NOTE — Progress Notes (Signed)
Palliative:  Consult received. Chart reviewed and patient assessed. Patient with AMS and unable to participate in [place] conversation. Attempted to discuss [place] with wife - called x2. She has not answered and no voicemail to leave message. Will continue attempts to speak with wife concerning GOC.   Thank you for this consult.  Juel Burrow, DNP, AGNP-C Palliative Medicine Team Team Phone # [phone]  Pager # [phone]  NO CHARGE

## 2018-11-23 NOTE — Progress Notes (Signed)
Patient ID: [name], male   DOB: 11/17/1924, 85 y.o.   MRN: [id number]  PROGRESS NOTE    [name]  SEG:[id number] DOB: 08-Dec-1924 DOA: 11/22/2018 PCP: [name], MD   Brief Narrative:  85 year old male with history of depression, CVA, COPD, tobacco abuse, hypertension, chronic diastolic CHF, persistent A. fib, aortic stenosis, hyperlipidemia, nephrolithiasis, PVD, renal cell cancer, chronic kidney disease stage III presented on 11/22/2018 with altered mental status.  Apparently he was recently diagnosed with UTI and started on Keflex on 11/16/2018.  He was admitted for sepsis most likely secondary to UTI and started on broad-spectrum antibiotics.  Assessment & Plan:   Principal Problem:   Sepsis, unspecified organism [facility]) Active Problems:   Hyperlipidemia   Essential hypertension   PERIPHERAL VASCULAR DISEASE   COPD (chronic obstructive pulmonary disease) (HCC)   CKD (chronic kidney disease), stage III (H[facility])   History of CVA (cerebrovascular accident)   Smoker unmotivated to quit   Aortic stenosis   Temporal arteritis (H[facility])   Elevated lactic acid level   Persistent atrial fibrillation   Chronic diastolic CHF (congestive heart failure) (HCC)   Bradycardia  Sepsis: Present on admission -Probably from UTI -Patient was started on cefepime and was also given Flagyl and vancomycin on admission.  Continue cefepime.  No need for vancomycin or Flagyl. -Hemodynamically improving.  Follow cultures.  Decrease normal saline to 75 cc an hour.  UTI -Follow cultures.  Continue cefepime  Leukocytosis -Monitor.  Acute metabolic encephalopathy -Patient is more awake but does not answer questions.  Monitor mental status.  Fall precautions.  CT head was negative for acute intracranial abnormality.  If mental status does not improve, might consider MRI of the brain -PT/OT eval.  SLP eval  Chronic diastolic CHF/aortic stenosis/pulmonary hypertension -Currently compensated.  Strict  input and output.  Daily weights.  Lasix on hold.  Last echo on 01/17/2018 showed EF of 50 to 55% with grade 1 diastolic dysfunction  Persistent A. fib/bradycardia -Continue Eliquis  Hyperlipidemia -Outpatient follow-up  Chronic kidney disease stage III -Creatinine stable.  Monitor  COPD--stable.  Continue DuoNeb as needed  Temporal arteritis -Continue prednisone 5 mg daily.  Outpatient follow-up  Failure to thrive--continue Megace.  Palliative care consultation pending.  Tobacco abuse -Patient needs to stop smoking.   DVT prophylaxis: Eliquis Code Status: Full Family Communication: None at bedside disposition Plan: Depends on clinical outcome  Consultants: None  Procedures: None  Antimicrobials:  Cefepime from 11/22/2018 onwards Vancomycin and Flagyl 1 dose on 11/22/2018   Subjective:  Patient seen and examined at bedside.  He is awake but hardly answers any questions.  No overnight fever or vomiting reported.   Objective: Vitals:   11/22/18 1651 11/22/18 2235 11/23/18 0300 11/23/18 0445  BP: (!) 145/65 (!) 155/52  (!) 170/62  Pulse: 76 74  76  Resp:  20  16  Temp: 97.8 F (36.6 C) 97.7 F (36.5 C)  98 F (36.7 C)  TempSrc: Oral Oral  Oral  SpO2: 98% 100%  100%  Weight:   68.3 kg   Height:        Intake/Output Summary (Last 24 hours) at 11/23/2018 1023 Last data filed at 11/23/2018 0333 Gross per 24 hour  Intake 2621.74 ml  Output 700 ml  Net 1921.74 ml   Filed Weights   11/22/18 1211 11/23/18 0300  Weight: 74.4 kg 68.3 kg    Examination:  General exam: Appears calm and comfortable.  Elderly male lying in bed.  Awake but hardly answers any questions.  Confused Respiratory system: Bilateral decreased breath sounds at bases with some scattered crackles Cardiovascular system: S1 & S2 heard, Rate controlled Gastrointestinal system: Abdomen is nondistended, soft and nontender. Normal bowel sounds heard. Extremities: No cyanosis, clubbing, edema   Central nervous system: Awake but hardly answers any questions.  Confused. No focal neurological deficits. Moving extremities     Data Reviewed: I have personally reviewed following labs and imaging studies  CBC: Recent Labs  Lab 11/22/18 1225 11/23/18 0421  WBC 18.7* 17.0*  NEUTROABS 12.2*  --   HGB 14.1 13.5  HCT 44.0 40.2  MCV 94.8 93.9  PLT 293 563   Basic Metabolic Panel: Recent Labs  Lab 11/22/18 1225 11/22/18 1540 11/23/18 0421  NA 136  --  140  K 4.0  --  4.2  CL 107  --  110  CO2 20*  --  21*  GLUCOSE 100*  --  80  BUN 46*  --  43*  CREATININE 1.80*  --  1.68*  CALCIUM 10.7*  --  10.4*  MG  --  2.0 1.9   GFR: Estimated Creatinine Clearance: 24.4 mL/min (A) (by C-G formula based on SCr of 1.68 mg/dL (H)). Liver Function Tests: Recent Labs  Lab 11/22/18 1225  AST 28  ALT 19  ALKPHOS 91  BILITOT 0.8  PROT 6.4*  ALBUMIN 2.2*   No results for input(s): LIPASE, AMYLASE in the last 168 hours. No results for input(s): AMMONIA in the last 168 hours. Coagulation Profile: Recent Labs  Lab 11/23/18 0421  INR 1.4*   Cardiac Enzymes: No results for input(s): CKTOTAL, CKMB, CKMBINDEX, TROPONINI in the last 168 hours. BNP (last 3 results) No results for input(s): PROBNP in the last 8760 hours. HbA1C: No results for input(s): HGBA1C in the last 72 hours. CBG: No results for input(s): GLUCAP in the last 168 hours. Lipid Profile: Recent Labs    11/22/18 1616  CHOL 133  HDL 51  LDLCALC 62  TRIG 100  CHOLHDL 2.6   Thyroid Function Tests: No results for input(s): TSH, T4TOTAL, FREET4, T3FREE, THYROIDAB in the last 72 hours. Anemia Panel: No results for input(s): VITAMINB12, FOLATE, FERRITIN, TIBC, IRON, RETICCTPCT in the last 72 hours. Sepsis Labs: Recent Labs  Lab 11/22/18 1225 11/22/18 1500 11/23/18 0421  PROCALCITON  --   --  <0.10  LATICACIDVEN 2.5* 1.4  --     No results found for this or any previous visit (from the past 240 hour(s)).        Radiology Studies: Ct Head Wo Contrast  Result Date: 11/22/2018 CLINICAL DATA:  Confusion and disorientation EXAM: CT HEAD WITHOUT CONTRAST TECHNIQUE: Contiguous axial images were obtained from the base of the skull through the vertex without intravenous contrast. COMPARISON:  Head CT October 25, 2017 and brain MRI October 26, 2017 FINDINGS: Brain: There is moderate diffuse atrophy. There is no intracranial mass, hemorrhage, extra-axial fluid collection, or midline shift. There is extensive small vessel disease throughout the centra semiovale bilaterally. There is small vessel disease in each internal capsule. No acute appearing infarct is demonstrable. Vascular: There is no appreciable hyperdense vessel. There is calcification in each carotid siphon region. Skull: Bony calvarium appears intact. Sinuses/Orbits: There is mucosal thickening in several ethmoid air cells. Other visualized paranasal sinuses are clear. Orbits appear symmetric bilaterally. Other: Mastoid air cells are clear. IMPRESSION: Atrophy with extensive supratentorial small vessel disease. No acute infarct evident. No mass or hemorrhage. There are foci  of arterial vascular calcification. There is mucosal thickening in several ethmoid air cells. Electronically Signed   By: Lowella Grip III M.D.   On: 11/22/2018 13:46   Dg Chest Port 1 View  Result Date: 11/22/2018 CLINICAL DATA:  Altered mental status EXAM: PORTABLE CHEST 1 VIEW COMPARISON:  10/28/2018 FINDINGS: The heart size and mediastinal contours are within normal limits. Both lungs are clear. The visualized skeletal structures are unremarkable. IMPRESSION: No active disease. Electronically Signed   By: Kathreen Devoid   On: 11/22/2018 12:30        Scheduled Meds: . allopurinol  100 mg Oral Daily  . amLODipine  5 mg Oral Daily  . apixaban  2.5 mg Oral BID  . aspirin EC  81 mg Oral Daily  . citalopram  10 mg Oral Daily  . megestrol  40 mg Oral Daily  .  mirtazapine  15 mg Oral QHS  . pantoprazole  80 mg Oral Daily  . pravastatin  40 mg Oral QHS  . predniSONE  5 mg Oral Q breakfast  . vancomycin variable dose per unstable renal function (pharmacist dosing)   Does not apply See admin instructions   Continuous Infusions: . sodium chloride 125 mL/hr at 11/23/18 0131  . ceFEPime (MAXIPIME) IV       LOS: 1 day        Aline August, MD [facility] 11/23/2018, 10:23 AM

## 2018-11-23 NOTE — Evaluation (Signed)
Clinical/Bedside Swallow Evaluation Patient Details  Name: [name] MRN: [id number] Date of Birth: Jan 01, 1925  Today's Date: 11/23/2018 Time: SLP Start Time (ACUTE ONLY): 1430 SLP Stop Time (ACUTE ONLY): 1440 SLP Time Calculation (min) (ACUTE ONLY): 10 min  Past Medical History:  Past Medical History:  Diagnosis Date  . ALLERGIC RHINITIS 09/29/2007  . Aortic stenosis 02/05/2017  . Arthritis    "was in his knees" (01/19/2018)  . BACK PAIN 01/31/2009  . BENIGN PROSTATIC HYPERTROPHY 05/12/2007  . CHEST PAIN 01/31/2008  . CHRONIC OBSTRUCTIVE PULMONARY DISEASE, ACUTE EXACERBATION 08/17/2007  . CONJUNCTIVITIS, ALLERGIC, CHRONIC 03/24/2010  . CONSTIPATION 02/19/2010  . COPD 05/12/2007  . CVA (cerebral vascular accident) ([place]) 05/03/2011  . GERD 02/23/2010  . GOUT 07/04/2010  . Heart murmur   . History of kidney stones    "passed it"  . History of stomach ulcers   . HYPERLIPIDEMIA 05/12/2007  . HYPERTENSION 05/12/2007  . NEPHROLITHIASIS, HX OF   . OBESITY 05/12/2007  . PEPTIC ULCER DISEASE 05/12/2007  . PERIPHERAL VASCULAR DISEASE 05/12/2007  . Personal history of unspecified circulatory disease 05/12/2007  . PLANTAR FASCIITIS, RIGHT 09/27/2008  . RENAL CELL CANCER 05/12/2007  . RENAL INSUFFICIENCY 08/17/2007  . Sepsis, unspecified organism ([place]) 11/22/2018  . SKIN LESION 11/07/2010  . VERTIGO 09/29/2007  . WRIST PAIN, RIGHT 02/26/2009   Past Surgical History:  Past Surgical History:  Procedure Laterality Date  . ARTERY BIOPSY Bilateral 10/27/2017   Procedure: BILATERAL TEMPORAL ARTERY BIOPSY;  Surgeon: Serafina Mitchell, MD;  Location: [facility];  Service: Vascular;  Laterality: Bilateral;  . CARDIOVERSION N/A 03/21/2018   Procedure: CARDIOVERSION;  Surgeon: Skeet Latch, MD;  Location: [facility];  Service: Cardiovascular;  Laterality: N/A;  . CATARACT EXTRACTION W/ INTRAOCULAR LENS  IMPLANT, BILATERAL Bilateral   . FEMORAL-POPLITEAL BYPASS GRAFT Bilateral   . INNER EAR SURGERY Bilateral     "had it twice in one of his ears; once in the other"  . JOINT REPLACEMENT    . NEPHRECTOMY    . NEPHROURETERECTOMY    . TOTAL KNEE ARTHROPLASTY Left 06/2007  . TOTAL KNEE ARTHROPLASTY Right    HPI:  85 year old male with history of depression, CVA, COPD, tobacco abuse, hypertension, chronic diastolic CHF, persistent A. fib, aortic stenosis, hyperlipidemia, nephrolithiasis, PVD, renal cell cancer, chronic kidney disease stage III presented on 11/22/2018 with altered mental status.  Apparently he was recently diagnosed with UTI and started on Keflex on 11/16/2018.    Assessment / Plan / Recommendation Clinical Impression  Pt demonstrates cognitive impairment impacting awareness with PO. Pt has dry, encrusted top denture and dry oral mucosa. Denture removed and cleaned, oral cavity cleaned. Pt able to take sips when presented with straw and bites of puree when presented with spoon, but was reluctant to take both. Did not trial solids and pt began moaning and refusing interaction. He responded to Y/N questions only when SLP spoke very loudly directly into his right ear, though he was still distractible and did not follow commands. Recommend pt downgrade diet texture to minced solids and thin liquids with assist for meals. Will follow for tolerance.  SLP Visit Diagnosis: Dysphagia, oral phase (R13.11)    Aspiration Risk  Mild aspiration risk    Diet Recommendation Dysphagia 2 (Fine chop);Thin liquid   Liquid Administration via: Cup;Straw Medication Administration: Crushed with puree Supervision: Full supervision/cueing for compensatory strategies Compensations: Small sips/bites;Slow rate Postural Changes: Seated upright at 90 degrees    Other  Recommendations  Follow up Recommendations Skilled Nursing facility      Frequency and Duration min 2x/week  1 week       Prognosis Prognosis for Safe Diet Advancement: Good Barriers to Reach Goals: Cognitive deficits      Swallow Study    General HPI: 85 year old male with history of depression, CVA, COPD, tobacco abuse, hypertension, chronic diastolic CHF, persistent A. fib, aortic stenosis, hyperlipidemia, nephrolithiasis, PVD, renal cell cancer, chronic kidney disease stage III presented on 11/22/2018 with altered mental status.  Apparently he was recently diagnosed with UTI and started on Keflex on 11/16/2018.  Type of Study: Bedside Swallow Evaluation Previous Swallow Assessment: none Diet Prior to this Study: Regular;Thin liquids Temperature Spikes Noted: No Respiratory Status: Room air History of Recent Intubation: No Behavior/Cognition: Lethargic/Drowsy;Distractible;Doesn't follow directions Oral Cavity Assessment: Dry Oral Care Completed by SLP: Yes Oral Cavity - Dentition: Dentures, top;Edentulous Self-Feeding Abilities: Total assist Patient Positioning: Upright in bed Baseline Vocal Quality: Normal Volitional Cough: Cognitively unable to elicit Volitional Swallow: Unable to elicit    Oral/Motor/Sensory Function Overall Oral Motor/Sensory Function: Other (comment)(does not follow commands)   Ice Chips Ice chips: Not tested   Thin Liquid Thin Liquid: Within functional limits Presentation: Cup;Straw    Nectar Thick Nectar Thick Liquid: Not tested   Honey Thick Honey Thick Liquid: Not tested   Puree Puree: Within functional limits Presentation: Spoon   Solid     Solid: Not tested      Ogechi Kuehnel, Katherene Ponto 11/23/2018,2:48 PM

## 2018-11-23 NOTE — Consult Note (Signed)
Consultation Note Date: 11/23/2018   Patient Name: [name]  DOB: 07/07/1925  MRN: [id number]  Age / Sex: 85 y.o., male  PCP: [name], MD Referring Physician: Aline August, MD  Reason for Consultation: Establishing goals of care  HPI/Patient Profile: 85 y.o. male  with past medical history of diastolic CHF, aortic stenosis, a fib, COPD, CVA, GERD, HLD, HTN, PUD, PVD, renal cell cancer, depression, CKD 3, and tobacco use admitted on 11/22/2018 with AMS for 1 week. Treated for sepsis/UTI. Continues to have altered mental status. CT negative. Poor PO intake. PMT consulted for [place].  Clinical Assessment and Goals of Care: I have reviewed medical records including EPIC notes, labs and imaging, assessed the patient and then spoke with patient's wife and son  to discuss diagnosis prognosis, GOC, EOL wishes, disposition and options.  I introduced Palliative Medicine as specialized medical care for people living with serious illness. It focuses on providing relief from the symptoms and stress of a serious illness. The goal is to improve quality of life for both the patient and the family.  As far as functional and nutritional status, they tell me prior to patient being diagnosed with UTI at home he used a walker, spent most of his time sitting, and had a good appetite. Noted that patient was prescribed megace by PCP. They deny concerns with mental status - no confusion. Tell me he was typically "sharp" and alert.    We discussed his current illness and what it means in the larger context of his on-going co-morbidities. Discussed UTI and sepsis being treated with antibiotics. Discussed concern about continued altered mental status. Discussed poor PO intake.   Discussed recommendations had been made for SNF - wife tells me she would prefer to bring him home.  We discussed continuing current treatment plan and evaluating patient day by day - if  patient continues to decline/no improvement - wife would like him to come home with support of hospice. We discussed hospice care - focus on comfort and quality of life.   I explained to patient's wife that he is currently full code and we discussed what that meant - she immediately tells me "No, he would never want that". Tells me he has been clear with her about his wishes often and would not want resuscitation attempts and would want to "go home" when it is "his time". Discussed with her what a DNR order meant and she agreed this should be in place. Discussed it does not change current treatment - only changes how medical team responds if he dies.   Wife and son both ask if wife can come to hospital - we discussed visitor restrictions d/t COVID. They express understanding.   Questions and concerns were addressed. The family was encouraged to call with questions or concerns.   Primary Decision Maker NEXT OF KIN - wife - Izora Gala Behrman    SUMMARY OF RECOMMENDATIONS   Code status changed to DNR Continue current treatment - however, if no improvement wife would like to bring patient home with hospice - no SNF placement PMT will continue to follow closely and support family/provide updates  Code Status/Advance Care Planning:  DNR   Symptom Management:   Per primary - all sedating meds have been dc'd d/t mental status  Palliative Prophylaxis:   Aspiration, Bowel Regimen, Delirium Protocol, Frequent Pain Assessment, Oral Care and Turn Reposition  Psycho-social/Spiritual:   Desire for further Chaplaincy support:no  Additional Recommendations: Education on Hospice  Prognosis:  Unable to determine  Discharge Planning: To Be Determined      Primary Diagnoses: Present on Admission: . Sepsis, unspecified organism ([place]) . Chronic diastolic CHF (congestive heart failure) ([place]) . Essential hypertension . PERIPHERAL VASCULAR DISEASE . Persistent atrial fibrillation . Temporal  arteritis ([place]) . COPD (chronic obstructive pulmonary disease) ([place]) . CKD (chronic kidney disease), stage III ([place]) . Bradycardia . Elevated lactic acid level . Hyperlipidemia . Smoker unmotivated to quit   I have reviewed the medical record, interviewed the patient and family, and examined the patient. The following aspects are pertinent.  Past Medical History:  Diagnosis Date  . ALLERGIC RHINITIS 09/29/2007  . Aortic stenosis 02/05/2017  . Arthritis    "was in his knees" (01/19/2018)  . BACK PAIN 01/31/2009  . BENIGN PROSTATIC HYPERTROPHY 05/12/2007  . CHEST PAIN 01/31/2008  . CHRONIC OBSTRUCTIVE PULMONARY DISEASE, ACUTE EXACERBATION 08/17/2007  . CONJUNCTIVITIS, ALLERGIC, CHRONIC 03/24/2010  . CONSTIPATION 02/19/2010  . COPD 05/12/2007  . CVA (cerebral vascular accident) ([place]) 05/03/2011  . GERD 02/23/2010  . GOUT 07/04/2010  . Heart murmur   . History of kidney stones    "passed it"  . History of stomach ulcers   . HYPERLIPIDEMIA 05/12/2007  . HYPERTENSION 05/12/2007  . NEPHROLITHIASIS, HX OF   . OBESITY 05/12/2007  . PEPTIC ULCER DISEASE 05/12/2007  . PERIPHERAL VASCULAR DISEASE 05/12/2007  . Personal history of unspecified circulatory disease 05/12/2007  . PLANTAR FASCIITIS, RIGHT 09/27/2008  . RENAL CELL CANCER 05/12/2007  . RENAL INSUFFICIENCY 08/17/2007  . Sepsis, unspecified organism ([place]) 11/22/2018  . SKIN LESION 11/07/2010  . VERTIGO 09/29/2007  . WRIST PAIN, RIGHT 02/26/2009   Social History   Socioeconomic History  . Marital status: Married    Spouse name: Not on file  . Number of children: 4  . Years of education: 5  . Highest education level: Not on file  Occupational History  . Occupation: retired Solicitor: RETIRED  Social Needs  . Financial resource strain: Not hard at all  . Food insecurity:    Worry: Never true    Inability: Never true  . Transportation needs:    Medical: No    Non-medical: No  Tobacco Use  . Smoking status: Current Every Day  Smoker    Packs/day: 0.50    Years: 80.00    Pack years: 40.00    Types: Cigarettes  . Smokeless tobacco: Never Used  Substance and Sexual Activity  . Alcohol use: No  . Drug use: No  . Sexual activity: Not Currently  Lifestyle  . Physical activity:    Days per week: 4 days    Minutes per session: 20 min  . Stress: Only a little  Relationships  . Social connections:    Talks on phone: More than three times a week    Gets together: More than three times a week    Attends religious service: More than 4 times per year    Active member of club or organization: Yes    Attends meetings of clubs or organizations: More than 4 times per year    Relationship status: Married  Other Topics Concern  . Not on file  Social History Narrative   Married for 71 yrs as of June 2019. Met in a bowling alley.    Family History  Problem Relation Age of Onset  . Cancer Mother        colon cancer  . Heart disease Brother   . Alcohol  abuse Brother   . Cancer Daughter        bladder cancer  . Cancer Other        grandson leukemia and BMT  . Cancer Other        sibling with pancreatic cancer   Scheduled Meds: . allopurinol  100 mg Oral Daily  . amLODipine  5 mg Oral Daily  . apixaban  2.5 mg Oral BID  . aspirin EC  81 mg Oral Daily  . citalopram  10 mg Oral Daily  . megestrol  40 mg Oral Daily  . mirtazapine  15 mg Oral QHS  . pantoprazole  80 mg Oral Daily  . pravastatin  40 mg Oral QHS  . predniSONE  5 mg Oral Q breakfast   Continuous Infusions: . sodium chloride 75 mL/hr at 11/23/18 1037  . ceFEPime (MAXIPIME) IV 1 g (11/23/18 1142)   PRN Meds:.acetaminophen **OR** acetaminophen, docusate sodium, ipratropium-albuterol, polyethylene glycol, traMADol, traZODone Allergies  Allergen Reactions  . Sulfonamide Derivatives Rash   Review of Systems  Unable to perform ROS: Mental status change    Physical Exam Constitutional:      General: He is not in acute distress. Cardiovascular:      Rate and Rhythm: Normal rate and regular rhythm.  Pulmonary:     Effort: Pulmonary effort is normal.     Breath sounds: Normal breath sounds.  Skin:    General: Skin is warm and dry.  Neurological:     Mental Status: He is lethargic and disoriented.     Comments: Does not speak, trying to get out of chair, weak     Vital Signs: BP (!) 143/100 (BP Location: Left Arm)   Pulse 69   Temp 98.4 F (36.9 C) (Oral)   Resp 20   Ht '5\' 5"'$  (1.651 m)   Wt 68.3 kg   SpO2 93%   BMI 25.06 kg/m  Pain Scale: 0-10   Pain Score: Asleep   SpO2: SpO2: 93 % O2 Device:SpO2: 93 % O2 Flow Rate: .   IO: Intake/output summary:   Intake/Output Summary (Last 24 hours) at 11/23/2018 1549 Last data filed at 11/23/2018 1300 Gross per 24 hour  Intake 1421.74 ml  Output 700 ml  Net 721.74 ml    LBM: Last BM Date: (PTA) Baseline Weight: Weight: 74.4 kg Most recent weight: Weight: 68.3 kg     Palliative Assessment/Data: PPS 40%    Time Total: 70 minutes Greater than 50%  of this time was spent counseling and coordinating care related to the above assessment and plan.  Juel Burrow, DNP, AGNP-C Palliative Medicine Team [phone] Pager: [phone]

## 2018-11-23 NOTE — Evaluation (Signed)
Physical Therapy Evaluation Patient Details Name: [name] MRN: [id number] DOB: [birth year] Today's Date: 11/23/2018   History of Present Illness  Pt is a 85 y/o male admitted secondary to sepsis likely from UTI. CT of head negative for acute abnormality. PMH includes PVD, COPD, CKD, CVA, HTN dementia, tobacco abuse, dCHF, and a fib.   Clinical Impression  Pt admitted secondary to problem above with deficits below. Pt requiring max A +2 for rolling from side to side for clean up. Pt very restless, grimacing, and moaning throughout, therefore further mobility deferred. Unsure of baseline cognition. Pt unable to report home info or PLOF. Anticipate pt will require SNF level therapies at d/c. Will continue to follow acutely to maximize functional mobility independence and safety.     Follow Up Recommendations SNF;Supervision/Assistance - 24 hour    Equipment Recommendations  None recommended by PT    Recommendations for Other Services       Precautions / Restrictions Precautions Precautions: Fall Restrictions Weight Bearing Restrictions: No      Mobility  Bed Mobility Overal bed mobility: Needs Assistance Bed Mobility: Rolling Rolling: Max assist;+2 for physical assistance         General bed mobility comments: Max +2 for rolling from side to side. Pt moaning throughout in pain, so further mobility deferred.   Transfers                    Ambulation/Gait                Stairs            Wheelchair Mobility    Modified Rankin (Stroke Patients Only)       Balance                                             Pertinent Vitals/Pain Pain Assessment: Faces Faces Pain Scale: Hurts even more Pain Location: generalized with movement Pain Descriptors / Indicators: Grimacing;Guarding Pain Intervention(s): Limited activity within patient's tolerance;Monitored during session;Repositioned    Home Living Family/patient expects to  be discharged to:: Private residence Living Arrangements: Spouse/significant other               Additional Comments: Per RN, pt lived at home with wife, however, pt unable to report any home information secondary to cognitive deficits.     Prior Function           Comments: Unsure of PLOF as pt unable to report and no family present.      Hand Dominance        Extremity/Trunk Assessment   Upper Extremity Assessment Upper Extremity Assessment: Defer to OT evaluation    Lower Extremity Assessment Lower Extremity Assessment: Generalized weakness       Communication   Communication: HOH;Expressive difficulties  Cognition Arousal/Alertness: Lethargic Behavior During Therapy: Restless Overall Cognitive Status: No family/caregiver present to determine baseline cognitive functioning                                 General Comments: Pt moaning throughout session, and did not answer any questions. Pt with garbled speech throughout. Unsure of baseline.       General Comments      Exercises     Assessment/Plan    PT Assessment Patient needs continued PT  services  PT Problem List Decreased strength;Decreased balance;Decreased activity tolerance;Decreased mobility;Decreased cognition;Decreased knowledge of use of DME;Decreased safety awareness;Decreased knowledge of precautions;Pain       PT Treatment Interventions DME instruction;Gait training;Functional mobility training;Therapeutic activities;Therapeutic exercise;Balance training;Patient/family education;Cognitive remediation    PT Goals (Current goals can be found in the Care Plan section)  Acute Rehab PT Goals PT Goal Formulation: Patient unable to participate in goal setting Time For Goal Achievement: 12/07/18 Potential to Achieve Goals: Fair    Frequency Min 2X/week   Barriers to discharge        Co-evaluation               AM-PAC PT "6 Clicks" Mobility  Outcome Measure Help  needed turning from your back to your side while in a flat bed without using bedrails?: Total Help needed moving from lying on your back to sitting on the side of a flat bed without using bedrails?: Total Help needed moving to and from a bed to a chair (including a wheelchair)?: Total Help needed standing up from a chair using your arms (e.g., wheelchair or bedside chair)?: Total Help needed to walk in hospital room?: Total Help needed climbing 3-5 steps with a railing? : Total 6 Click Score: 6    End of Session   Activity Tolerance: Patient limited by pain Patient left: in bed;with call bell/phone within reach;with bed alarm set;with nursing/sitter in room Nurse Communication: Mobility status PT Visit Diagnosis: Other abnormalities of gait and mobility (R26.89);Muscle weakness (generalized) (M62.81);Difficulty in walking, not elsewhere classified (R26.2);Unsteadiness on feet (R26.81)    Time: 1694-5038 PT Time Calculation (min) (ACUTE ONLY): 17 min   Charges:   PT Evaluation $PT Eval Moderate Complexity: 1 Mod          Leighton Ruff, PT, DPT  Acute Rehabilitation Services  Pager: [phone] Office: [phone]   Rudean Hitt 11/23/2018, 12:54 PM

## 2018-11-24 ENCOUNTER — Encounter (HOSPITAL_COMMUNITY): Payer: Self-pay

## 2018-11-24 ENCOUNTER — Other Ambulatory Visit: Payer: Self-pay

## 2018-11-24 DIAGNOSIS — Z7189 Other specified counseling: Secondary | ICD-10-CM

## 2018-11-24 DIAGNOSIS — G9341 Metabolic encephalopathy: Secondary | ICD-10-CM

## 2018-11-24 DIAGNOSIS — I10 Essential (primary) hypertension: Secondary | ICD-10-CM

## 2018-11-24 DIAGNOSIS — Z515 Encounter for palliative care: Secondary | ICD-10-CM

## 2018-11-24 LAB — CBC
HCT: 37.4 % — ABNORMAL LOW (ref 39.0–52.0)
Hemoglobin: 12.5 g/dL — ABNORMAL LOW (ref 13.0–17.0)
MCH: 31.3 pg (ref 26.0–34.0)
MCHC: 33.4 g/dL (ref 30.0–36.0)
MCV: 93.7 fL (ref 80.0–100.0)
Platelets: 288 10*3/uL (ref 150–400)
RBC: 3.99 MIL/uL — AB (ref 4.22–5.81)
RDW: 15 % (ref 11.5–15.5)
WBC: 16.8 10*3/uL — ABNORMAL HIGH (ref 4.0–10.5)
nRBC: 0 % (ref 0.0–0.2)

## 2018-11-24 LAB — BASIC METABOLIC PANEL
Anion gap: 10 (ref 5–15)
BUN: 38 mg/dL — ABNORMAL HIGH (ref 8–23)
CO2: 19 mmol/L — AB (ref 22–32)
Calcium: 10.8 mg/dL — ABNORMAL HIGH (ref 8.9–10.3)
Chloride: 112 mmol/L — ABNORMAL HIGH (ref 98–111)
Creatinine, Ser: 1.61 mg/dL — ABNORMAL HIGH (ref 0.61–1.24)
GFR calc Af Amer: 42 mL/min — ABNORMAL LOW (ref >60)
GFR calc non Af Amer: 37 mL/min — ABNORMAL LOW (ref >60)
Glucose, Bld: 80 mg/dL (ref 70–99)
Potassium: 3.8 mmol/L (ref 3.5–5.1)
Sodium: 141 mmol/L (ref 135–145)

## 2018-11-24 LAB — MAGNESIUM: Magnesium: 1.8 mg/dL (ref 1.7–2.4)

## 2018-11-24 NOTE — Progress Notes (Signed)
Occupational Therapy Treatment Patient Details Name: [name] MRN: [id number] DOB: [birth year] Today's Date: 11/24/2018    History of present illness Pt is a 85 y/o male admitted secondary to sepsis likely from UTI. CT of head negative for acute abnormality. PMH includes PVD, COPD, CKD, CVA, HTN dementia, tobacco abuse, dCHF, and a fib.    OT comments  Pt admitted with the above diagnoses and presents with below problem list. Pt will benefit from continued acute OT to address the below listed deficits and maximize independence with basic ADLs prior to d/c to venue below. Unclear what pt's PLOF was. It appears he is from home where he lives with his spouse. Pt is currently max-total A with ADLs, +2 physical assist for LB ADLs in sit<>stand and for stand pivot tranfers. Pt moaning throughout session. Occasional brief reply to basic questions. Garbled speech. Was able to state name after multiple attempts at asking ([place] vs cognition?). Left sitting up in recliner with all needs met, hand mitt restraints reapplied, chair alarm on; appears calm and comfortable at end of session.     Follow Up Recommendations  SNF    Equipment Recommendations  Other (comment)(defer to next venue)    Recommendations for Other Services      Precautions / Restrictions Precautions Precautions: Fall Restrictions Weight Bearing Restrictions: No Other Position/Activity Restrictions: very HOH (hears better from R ear), appears to be blind in R eye       Mobility Bed Mobility Overal bed mobility: Needs Assistance Bed Mobility: Supine to Sit Rolling: Max assist;Total assist;+2 for physical assistance   Supine to sit: Max assist;+2 for physical assistance;Total assist     General bed mobility comments: Max +2 for trunk elevation, total for remainder of bed mobility.   Transfers Overall transfer level: Needs assistance Equipment used: Rolling walker (2 wheeled) Transfers: Sit to/from [employer] Sit to Stand: Max assist;+2 physical assistance Stand pivot transfers: Mod assist;+2 physical assistance       General transfer comment: max A to power up, block rw (pt pulling up with both hands on rw) and steady balance once standing. Pt stood <30 seconds then sat again. Seated rest break then SPT with mod +2 A for balance during pivoting and to control descent into recliner.     Balance Overall balance assessment: Needs assistance Sitting-balance support: Bilateral upper extremity supported;Feet supported Sitting balance-Leahy Scale: Poor Sitting balance - Comments: fair-poor sitting balance. Once both feet supported on floor pt sat min guard to min A.    Standing balance support: Bilateral upper extremity supported;During functional activity Standing balance-Leahy Scale: Zero Standing balance comment: rw and max A (mod +2) for balance                           ADL either performed or assessed with clinical judgement   ADL Overall ADL's : Needs assistance/impaired Eating/Feeding: Total assistance   Grooming: Total assistance   Upper Body Bathing: Total assistance;Sitting Upper Body Bathing Details (indicate cue type and reason): able to sit EOB with min guard to min A; assist mostly due to cognition Lower Body Bathing: Total assistance;+2 for physical assistance;Sit to/from stand Lower Body Bathing Details (indicate cue type and reason): mod A +2 for transfer, assist also due to cogntion Upper Body Dressing : Total assistance;Sitting Upper Body Dressing Details (indicate cue type and reason): able to sit EOB with min guard to min A; assist mostly due to cognition  Lower Body Dressing: +2 for physical assistance;Total assistance;Sit to/from stand Lower Body Dressing Details (indicate cue type and reason): mod A +2 for transfer, assist also due to cogntion Toilet Transfer: Moderate assistance;+2 for physical assistance;Stand-pivot;BSC;RW Toilet Transfer Details  (indicate cue type and reason): simulated with BSC>recliner           General ADL Comments: Pt completed bed mobility, sat EOB a few minutes then SPT to recliner.     Vision   Additional Comments: Pt with R eye vision impaired, baseline per chart review   Perception     Praxis      Cognition Arousal/Alertness: Lethargic(more alert once sitting up) Behavior During Therapy: Flat affect Overall Cognitive Status: History of cognitive impairments - at baseline                                 General Comments: Pt moaning throughout session. Occasional breif reply to basic questions. Garbled speech. Was able to state name after multiple attempts at asking (HOH vs cognition?)        Exercises     Shoulder Instructions       General Comments      Pertinent Vitals/ Pain       Pain Assessment: Faces Faces Pain Scale: Hurts even more Pain Location: generalized with movement Pain Descriptors / Indicators: Grimacing;Moaning Pain Intervention(s): Monitored during session;Repositioned  Home Living Family/patient expects to be discharged to:: Private residence Living Arrangements: Spouse/significant other                               Additional Comments: Per RN, pt lived at home with wife, however, pt unable to report any home information secondary to cognitive deficits.       Prior Functioning/Environment Level of Independence: Needs assistance  Gait / Transfers Assistance Needed: from May 2019 PT note: requires RW for limited household ambulation ADL's / Homemaking Assistance Needed: from May 2019 PT note: secondary to vision loss wife assist with ADLs, and iADLs Communication / Swallowing Assistance Needed: HOH Comments: Unsure of PLOF as pt unable to report and no family present.    Frequency  Min 2X/week        Progress Toward Goals  OT Goals(current goals can now be found in the care plan section)     Acute Rehab OT Goals Patient  Stated Goal: unable to state OT Goal Formulation: Patient unable to participate in goal setting Time For Goal Achievement: 12/08/18 Potential to Achieve Goals: Good ADL Goals Pt Will Perform Grooming: with mod assist;sitting Pt Will Perform Upper Body Bathing: with mod assist;sitting Pt Will Perform Lower Body Bathing: with mod assist;sit to/from stand Pt Will Transfer to Toilet: with mod assist;stand pivot transfer;bedside commode Pt Will Perform Toileting - Clothing Manipulation and hygiene: with max assist;sit to/from stand Additional ADL Goal #1: Pt will complete bed mobility at mod A +1 level to prepare for EOB/OOB ADLs.  Plan      Co-evaluation                 AM-PAC OT "6 Clicks" Daily Activity     Outcome Measure   Help from another person eating meals?: Total Help from another person taking care of personal grooming?: Total Help from another person toileting, which includes using toliet, bedpan, or urinal?: Total Help from another person bathing (including washing, rinsing, drying)?: Total Help  from another person to put on and taking off regular upper body clothing?: Total Help from another person to put on and taking off regular lower body clothing?: Total 6 Click Score: 6    End of Session Equipment Utilized During Treatment: Gait belt;Rolling walker  OT Visit Diagnosis: Unsteadiness on feet (R26.81);Muscle weakness (generalized) (M62.81);Pain;Other symptoms and signs involving cognitive function   Activity Tolerance Patient limited by lethargy;Patient tolerated treatment well   Patient Left in chair;with call bell/phone within reach;with chair alarm set;with restraints reapplied(hand mitts)   Nurse Communication Mobility status        Time: 7737-3668 OT Time Calculation (min): 23 min  Charges: OT General Charges $OT Visit: 1 Visit OT Evaluation $OT Eval Moderate Complexity: [place], OT Acute Rehabilitation Services Pager:  [phone] Office: [phone]    Hortencia Pilar 11/24/2018, 11:44 AM

## 2018-11-24 NOTE — Progress Notes (Signed)
Physical Therapy Treatment Patient Details Name: [name] MRN: [id number] DOB: [birth year] Today's Date: 11/24/2018    History of Present Illness Pt is a 85 y/o male admitted secondary to sepsis likely from UTI. CT of head negative for acute abnormality. PMH includes PVD, COPD, CKD, CVA, HTN dementia, tobacco abuse, dCHF, and a fib.     PT Comments    Pt participated in co tx session with [name] ( OT ).  Pt with generalized pain throughout session but he was able to achieve sitting and transfer from bed to recliner.  Pt required assistance for boosting into standing.  Pt continues to benefit from SNF placement to improve strength and function before returning home.    Follow Up Recommendations  SNF;Supervision/Assistance - 24 hour     Equipment Recommendations  None recommended by PT    Recommendations for Other Services       Precautions / Restrictions Precautions Precautions: Fall Restrictions Weight Bearing Restrictions: No Other Position/Activity Restrictions: very HOH (hears better from R ear), appears to be blind in R eye    Mobility  Bed Mobility Overal bed mobility: Needs Assistance Bed Mobility: Supine to Sit Rolling: Max assist;Total assist;+2 for physical assistance   Supine to sit: Max assist;+2 for physical assistance;Total assist     General bed mobility comments: Max +2 for trunk elevation and total +2 to advance LEs.  Pt able to reach for therapist for support during trunk elevation.    Transfers Overall transfer level: Needs assistance Equipment used: Rolling walker (2 wheeled) Transfers: Sit to/from [employer] Sit to Stand: Max assist;+2 physical assistance Stand pivot transfers: Mod assist;+2 physical assistance       General transfer comment: max A to power up, block rw (pt pulling up with both hands on rw) and steady balance once standing. Pt stood <30 seconds then sat again. Seated rest break then SPT with mod +2 A for  balance during pivoting and to control descent into recliner.   Ambulation/Gait                 Stairs             Wheelchair Mobility    Modified Rankin (Stroke Patients Only)       Balance Overall balance assessment: Needs assistance Sitting-balance support: Bilateral upper extremity supported;Feet supported Sitting balance-Leahy Scale: Poor Sitting balance - Comments: fair-poor sitting balance. Once both feet supported on floor pt sat min guard to min A.    Standing balance support: Bilateral upper extremity supported;During functional activity Standing balance-Leahy Scale: Zero Standing balance comment: rw and max A (mod +2) for balance                            Cognition Arousal/Alertness: Lethargic(more alert once sitting up) Behavior During Therapy: Flat affect Overall Cognitive Status: History of cognitive impairments - at baseline                                 General Comments: Pt moaning throughout session. Occasional breif reply to basic questions. Garbled speech. Was able to state name after multiple attempts at asking (HOH vs cognition?)      Exercises      General Comments        Pertinent Vitals/Pain Pain Assessment: Faces Faces Pain Scale: Hurts even more Pain Location: generalized with movement Pain Descriptors /  Indicators: Grimacing;Moaning Pain Intervention(s): Monitored during session;Repositioned    Home Living Family/patient expects to be discharged to:: Private residence Living Arrangements: Spouse/significant other             Additional Comments: Per RN, pt lived at home with wife, however, pt unable to report any home information secondary to cognitive deficits.     Prior Function Level of Independence: Needs assistance  Gait / Transfers Assistance Needed: from May 2019 PT note: requires RW for limited household ambulation ADL's / Homemaking Assistance Needed: from May 2019 PT note:  secondary to vision loss wife assist with ADLs, and iADLs Comments: Unsure of PLOF as pt unable to report and no family present.    PT Goals (current goals can now be found in the care plan section) Acute Rehab PT Goals Patient Stated Goal: unable to state Potential to Achieve Goals: Fair Progress towards PT goals: Progressing toward goals    Frequency    Min 2X/week      PT Plan Current plan remains appropriate    Co-evaluation PT/OT/SLP Co-Evaluation/Treatment: Yes            AM-PAC PT "6 Clicks" Mobility   Outcome Measure  Help needed turning from your back to your side while in a flat bed without using bedrails?: Total Help needed moving from lying on your back to sitting on the side of a flat bed without using bedrails?: Total Help needed moving to and from a bed to a chair (including a wheelchair)?: Total Help needed standing up from a chair using your arms (e.g., wheelchair or bedside chair)?: Total Help needed to walk in hospital room?: Total Help needed climbing 3-5 steps with a railing? : Total 6 Click Score: 6    End of Session Equipment Utilized During Treatment: Gait belt Activity Tolerance: Patient limited by pain Patient left: in bed;with call bell/phone within reach;with bed alarm set;with nursing/sitter in room Nurse Communication: Mobility status PT Visit Diagnosis: Other abnormalities of gait and mobility (R26.89);Muscle weakness (generalized) (M62.81);Difficulty in walking, not elsewhere classified (R26.2);Unsteadiness on feet (R26.81)     Time: 9201-0071 PT Time Calculation (min) (ACUTE ONLY): 23 min  Charges:  $Therapeutic Activity: 8-22 mins                     [name], PTA Acute Rehabilitation Services Pager [phone] Office [phone]     [name] 11/24/2018, 11:53 AM

## 2018-11-24 NOTE — Progress Notes (Signed)
  Speech Language Pathology Treatment: Dysphagia  Patient Details Name: [name] MRN: [id number] DOB: Jul 22, 1925 Today's Date: 11/24/2018 Time: 8325-4982 SLP Time Calculation (min) (ACUTE ONLY): 10 min  Assessment / Plan / Recommendation Clinical Impression  SLP provided assistance with breakfast meal. Pt anticipates the spoon and straw, opening his mouth to receive boluses. When attempting very small, soft bites of solids, he sucks on them without attempts to masticate or clear his mouth. SLP manually removed food from his mouth. Purees leave mild residue and he has mildly prolonged transit, but use of a liquid wash appears to facilitate both. He had a single instance of coughing, which sounded congested and prolonged. No further signs concerning for aspiration were observed, and RN denies seeing any. Recommend downgrading diet to Dys 1 textures, continuing thin liquids. Per RN, pt's family may be bringing his dentures to the hospital, which may facilitate potential to advance, although mentation may still be a barrier.    HPI HPI: 85 year old male with history of depression, CVA, COPD, tobacco abuse, hypertension, chronic diastolic CHF, persistent A. fib, aortic stenosis, hyperlipidemia, nephrolithiasis, PVD, renal cell cancer, chronic kidney disease stage III presented on 11/22/2018 with altered mental status.  Apparently he was recently diagnosed with UTI and started on Keflex on 11/16/2018.       SLP Plan  Continue with current plan of care       Recommendations  Diet recommendations: Dysphagia 1 (puree);Thin liquid Liquids provided via: Cup;Straw Medication Administration: Crushed with puree Supervision: Staff to assist with self feeding;Full supervision/cueing for compensatory strategies Compensations: Small sips/bites;Slow rate;Minimize environmental distractions;Follow solids with liquid Postural Changes and/or Swallow Maneuvers: Seated upright 90 degrees;Upright 30-60 min after  meal                Oral Care Recommendations: Oral care BID Follow up Recommendations: Skilled Nursing facility SLP Visit Diagnosis: Dysphagia, oral phase (R13.11) Plan: Continue with current plan of care       GO                Venita Sheffield Alaynah Schutter 11/24/2018, 9:04 AM  [name], M.A. [place] Acute Environmental education officer [phone] Office [phone]

## 2018-11-24 NOTE — Plan of Care (Signed)
  Problem: Education: Goal: Knowledge of General Education information will improve Description Including pain rating scale, medication(s)/side effects and non-pharmacologic comfort measures Outcome: Not Met (add Reason)

## 2018-11-24 NOTE — Progress Notes (Signed)
Spoke with patient's wife Deanthony Maull by phone at [phone] about patient's status. Informed wife patient was currently restless and that there were no new labs as no new labs had been drawn since day shift. Confirmed that patient had an elevated WBC and was admitted with sepsis.   Ms. Tolbert indicated that she had discussed with doctor her desire to bring patient home if there was no improvement. Informed her to continue to discuss desires with care team and that attending would do morning rounds most likely between 0700-0900 and to call back in the morning for further updates. Informed day nurse of wife's desire for updates.   Patient intermittently restless throughout the night (approx. q 2-3 hr). Upon repositioning and comforting patient became less restless. Informed day nurse to request PRN sleep aid for patient.

## 2018-11-24 NOTE — Progress Notes (Signed)
Patient ID: [name], male   DOB: 04-29-1925, 85 y.o.   MRN: [id number]  PROGRESS NOTE    [name]  PNT:[id number] DOB: 1925/03/29 DOA: 11/22/2018 PCP: [name], MD   Brief Narrative:  85 year old male with history of depression, CVA, COPD, tobacco abuse, hypertension, chronic diastolic CHF, persistent A. fib, aortic stenosis, hyperlipidemia, nephrolithiasis, PVD, renal cell cancer, chronic kidney disease stage III presented on 11/22/2018 with altered mental status.  Apparently he was recently diagnosed with UTI and started on Keflex on 11/16/2018.  He was admitted for sepsis most likely secondary to UTI and started on broad-spectrum antibiotics.  Assessment & Plan:   Principal Problem:   Sepsis, unspecified organism [facility]) Active Problems:   Hyperlipidemia   Essential hypertension   PERIPHERAL VASCULAR DISEASE   COPD (chronic obstructive pulmonary disease) (HCC)   CKD (chronic kidney disease), stage III (HCC)   History of CVA (cerebrovascular accident)   Smoker unmotivated to quit   Aortic stenosis   Temporal arteritis (HCC)   Elevated lactic acid level   Persistent atrial fibrillation   Chronic diastolic CHF (congestive heart failure) (HCC)   Bradycardia  Sepsis: Present on admission -Probably from UTI -Patient was started on cefepime and was also given Flagyl and vancomycin on admission.  Continue cefepime.  Vancomycin and Flagyl have been discontinued. -Hemodynamically improving.  Cultures negative so far.  Decrease normal saline to 50 cc an hour.  UTI -Follow cultures.  Continue cefepime  Leukocytosis -Not much improvement in white count.  Monitor.  Acute metabolic encephalopathy -Mental status has not much improved.  Monitor mental status.  Fall precautions.  CT head was negative for acute intracranial abnormality.  If mental status does not improve, might consider MRI of the brain -PT recommends SNF placement.  SLP eval -Will DC trazodone, mirtazapine and  citalopram.   Chronic diastolic CHF/aortic stenosis/pulmonary hypertension -Currently compensated.  Strict input and output.  Daily weights.  Lasix on hold.  Might resume tomorrow.  Last echo on 01/17/2018 showed EF of 50 to 55% with grade 1 diastolic dysfunction  Persistent A. fib -Continue Eliquis  Hyperlipidemia -Outpatient follow-up  Chronic kidney disease stage III -Creatinine stable.  Monitor  COPD--stable.  Continue DuoNeb as needed  Temporal arteritis -Continue prednisone 5 mg daily.  Outpatient follow-up  Failure to thrive--patient was started on Megace as an outpatient by PCP.  Overall oral intake still poor.  Palliative care consultation pending.  Tobacco abuse -Patient needs to stop smoking.   DVT prophylaxis: Eliquis Code Status: Full Family Communication: None at bedside.  Tried calling phone number listed in epic with no answer. disposition Plan: Will need SNF placement.  Consultants: None  Procedures: None  Antimicrobials:  Cefepime from 11/22/2018 onwards Vancomycin and Flagyl 1 dose on 11/22/2018   Subjective:  Patient seen and examined at bedside.  He is sleepy, wakes up slightly on calling his name.  Hardly answers any questions.  No overnight fever or vomiting reported.   Objective: Vitals:   11/23/18 1327 11/23/18 1529 11/23/18 2156 11/24/18 0539  BP: (!) 176/67 (!) 143/100 (!) 151/65 (!) 174/70  Pulse: 73 69 71 78  Resp: 20  20 (!) 21  Temp: 98.4 F (36.9 C)  97.9 F (36.6 C) 98.3 F (36.8 C)  TempSrc: Oral  Axillary Axillary  SpO2: (!) 84% 93% 99% 93%  Weight:    68.7 kg  Height:        Intake/Output Summary (Last 24 hours) at 11/24/2018 0867 Last  data filed at 11/24/2018 0432 Gross per 24 hour  Intake 50 ml  Output 875 ml  Net -825 ml   Filed Weights   11/22/18 1211 11/23/18 0300 11/24/18 0539  Weight: 74.4 kg 68.3 kg 68.7 kg    Examination:  General exam: Appears calm and comfortable.  Elderly male lying in bed.  Sleepy,  wakes up slightly on calling his name but hardly answers any questions.   Respiratory system: Bilateral decreased breath sounds at bases with some scattered crackles.  No wheezing Cardiovascular system: S1 & S2 heard, Rate controlled Gastrointestinal system: Abdomen is nondistended, soft and nontender. Normal bowel sounds heard. Extremities: No cyanosis, clubbing, edema     Data Reviewed: I have personally reviewed following labs and imaging studies  CBC: Recent Labs  Lab 11/22/18 1225 11/23/18 0421 11/24/18 0357  WBC 18.7* 17.0* 16.8*  NEUTROABS 12.2*  --   --   HGB 14.1 13.5 12.5*  HCT 44.0 40.2 37.4*  MCV 94.8 93.9 93.7  PLT 293 285 700   Basic Metabolic Panel: Recent Labs  Lab 11/22/18 1225 11/22/18 1540 11/23/18 0421 11/24/18 0357  NA 136  --  140 141  K 4.0  --  4.2 3.8  CL 107  --  110 112*  CO2 20*  --  21* 19*  GLUCOSE 100*  --  80 80  BUN 46*  --  43* 38*  CREATININE 1.80*  --  1.68* 1.61*  CALCIUM 10.7*  --  10.4* 10.8*  MG  --  2.0 1.9 1.8   GFR: Estimated Creatinine Clearance: 25.5 mL/min (A) (by C-G formula based on SCr of 1.61 mg/dL (H)). Liver Function Tests: Recent Labs  Lab 11/22/18 1225  AST 28  ALT 19  ALKPHOS 91  BILITOT 0.8  PROT 6.4*  ALBUMIN 2.2*   No results for input(s): LIPASE, AMYLASE in the last 168 hours. No results for input(s): AMMONIA in the last 168 hours. Coagulation Profile: Recent Labs  Lab 11/23/18 0421  INR 1.4*   Cardiac Enzymes: No results for input(s): CKTOTAL, CKMB, CKMBINDEX, TROPONINI in the last 168 hours. BNP (last 3 results) No results for input(s): PROBNP in the last 8760 hours. HbA1C: No results for input(s): HGBA1C in the last 72 hours. CBG: No results for input(s): GLUCAP in the last 168 hours. Lipid Profile: Recent Labs    11/22/18 1616  CHOL 133  HDL 51  LDLCALC 62  TRIG 100  CHOLHDL 2.6   Thyroid Function Tests: No results for input(s): TSH, T4TOTAL, FREET4, T3FREE, THYROIDAB in the  last 72 hours. Anemia Panel: No results for input(s): VITAMINB12, FOLATE, FERRITIN, TIBC, IRON, RETICCTPCT in the last 72 hours. Sepsis Labs: Recent Labs  Lab 11/22/18 1225 11/22/18 1500 11/23/18 0421  PROCALCITON  --   --  <0.10  LATICACIDVEN 2.5* 1.4  --     Recent Results (from the past 240 hour(s))  Blood Culture (routine x 2)     Status: None (Preliminary result)   Collection Time: 11/22/18 12:28 PM  Result Value Ref Range Status   Specimen Description BLOOD BLOOD RIGHT FOREARM  Final   Special Requests   Final    BOTTLES DRAWN AEROBIC AND ANAEROBIC Blood Culture adequate volume   Culture   Final    NO GROWTH 1 DAY Performed at [facility], [place] [address], [place], [place] [address]    Report Status PENDING  Incomplete  Blood Culture (routine x 2)     Status: None (Preliminary  result)   Collection Time: 11/22/18 12:28 PM  Result Value Ref Range Status   Specimen Description BLOOD BLOOD LEFT FOREARM  Final   Special Requests   Final    BOTTLES DRAWN AEROBIC AND ANAEROBIC Blood Culture results may not be optimal due to an inadequate volume of blood received in culture bottles   Culture   Final    NO GROWTH 1 DAY Performed at [place] [address], [place], [place] [address]    Report Status PENDING  Incomplete  Urine culture     Status: None   Collection Time: 11/22/18 12:32 PM  Result Value Ref Range Status   Specimen Description URINE, CATHETERIZED  Final   Special Requests NONE  Final   Culture   Final    NO GROWTH Performed at [facility], [place] [address], [place], [place] [address]    Report Status 11/23/2018 FINAL  Final         Radiology Studies: Ct Head Wo Contrast  Result Date: 11/22/2018 CLINICAL DATA:  Confusion and disorientation EXAM: CT HEAD WITHOUT CONTRAST TECHNIQUE: Contiguous axial images were obtained from the base of the skull through the vertex without intravenous contrast. COMPARISON:  Head CT October 25, 2017  and brain MRI October 26, 2017 FINDINGS: Brain: There is moderate diffuse atrophy. There is no intracranial mass, hemorrhage, extra-axial fluid collection, or midline shift. There is extensive small vessel disease throughout the centra semiovale bilaterally. There is small vessel disease in each internal capsule. No acute appearing infarct is demonstrable. Vascular: There is no appreciable hyperdense vessel. There is calcification in each carotid siphon region. Skull: Bony calvarium appears intact. Sinuses/Orbits: There is mucosal thickening in several ethmoid air cells. Other visualized paranasal sinuses are clear. Orbits appear symmetric bilaterally. Other: Mastoid air cells are clear. IMPRESSION: Atrophy with extensive supratentorial small vessel disease. No acute infarct evident. No mass or hemorrhage. There are foci of arterial vascular calcification. There is mucosal thickening in several ethmoid air cells. Electronically Signed   By: Lowella Grip III M.D.   On: 11/22/2018 13:46   Dg Chest Port 1 View  Result Date: 11/22/2018 CLINICAL DATA:  Altered mental status EXAM: PORTABLE CHEST 1 VIEW COMPARISON:  10/28/2018 FINDINGS: The heart size and mediastinal contours are within normal limits. Both lungs are clear. The visualized skeletal structures are unremarkable. IMPRESSION: No active disease. Electronically Signed   By: Kathreen Devoid   On: 11/22/2018 12:30        Scheduled Meds: . allopurinol  100 mg Oral Daily  . amLODipine  5 mg Oral Daily  . apixaban  2.5 mg Oral BID  . aspirin EC  81 mg Oral Daily  . citalopram  10 mg Oral Daily  . megestrol  40 mg Oral Daily  . mirtazapine  15 mg Oral QHS  . pantoprazole  80 mg Oral Daily  . pravastatin  40 mg Oral QHS  . predniSONE  5 mg Oral Q breakfast   Continuous Infusions: . sodium chloride 75 mL/hr at 11/23/18 1037  . ceFEPime (MAXIPIME) IV 1 g (11/23/18 1142)     LOS: 2 days        Aline August, MD [facility]  11/24/2018, 9:42 AM

## 2018-11-25 ENCOUNTER — Inpatient Hospital Stay (HOSPITAL_COMMUNITY): Payer: Medicare HMO

## 2018-11-25 DIAGNOSIS — G9341 Metabolic encephalopathy: Secondary | ICD-10-CM

## 2018-11-25 LAB — CBC
HCT: 36.8 % — ABNORMAL LOW (ref 39.0–52.0)
Hemoglobin: 12.5 g/dL — ABNORMAL LOW (ref 13.0–17.0)
MCH: 31.4 pg (ref 26.0–34.0)
MCHC: 34 g/dL (ref 30.0–36.0)
MCV: 92.5 fL (ref 80.0–100.0)
NRBC: 0 % (ref 0.0–0.2)
Platelets: 285 10*3/uL (ref 150–400)
RBC: 3.98 MIL/uL — ABNORMAL LOW (ref 4.22–5.81)
RDW: 15.1 % (ref 11.5–15.5)
WBC: 16.3 10*3/uL — ABNORMAL HIGH (ref 4.0–10.5)

## 2018-11-25 LAB — BASIC METABOLIC PANEL
ANION GAP: 6 (ref 5–15)
BUN: 42 mg/dL — ABNORMAL HIGH (ref 8–23)
CO2: 18 mmol/L — ABNORMAL LOW (ref 22–32)
Calcium: 10.9 mg/dL — ABNORMAL HIGH (ref 8.9–10.3)
Chloride: 117 mmol/L — ABNORMAL HIGH (ref 98–111)
Creatinine, Ser: 1.57 mg/dL — ABNORMAL HIGH (ref 0.61–1.24)
GFR calc Af Amer: 44 mL/min — ABNORMAL LOW (ref >60)
GFR calc non Af Amer: 38 mL/min — ABNORMAL LOW (ref >60)
Glucose, Bld: 122 mg/dL — ABNORMAL HIGH (ref 70–99)
Potassium: 4.1 mmol/L (ref 3.5–5.1)
Sodium: 141 mmol/L (ref 135–145)

## 2018-11-25 LAB — MAGNESIUM: Magnesium: 1.9 mg/dL (ref 1.7–2.4)

## 2018-11-25 MED ORDER — MIRTAZAPINE 15 MG PO TABS
15.0000 mg | ORAL_TABLET | Freq: Every day | ORAL | Status: DC
  Administered 2018-11-25: 15 mg via ORAL
  Filled 2018-11-25: qty 1

## 2018-11-25 MED ORDER — FUROSEMIDE 40 MG PO TABS
40.0000 mg | ORAL_TABLET | Freq: Every day | ORAL | Status: DC
  Administered 2018-11-25: 40 mg via ORAL
  Filled 2018-11-25 (×2): qty 1

## 2018-11-25 MED ORDER — CITALOPRAM HYDROBROMIDE 20 MG PO TABS
10.0000 mg | ORAL_TABLET | Freq: Every day | ORAL | Status: DC
  Administered 2018-11-25: 10 mg via ORAL
  Filled 2018-11-25 (×2): qty 1

## 2018-11-25 MED ORDER — QUETIAPINE FUMARATE 25 MG PO TABS
12.5000 mg | ORAL_TABLET | Freq: Every day | ORAL | Status: DC
  Administered 2018-11-25: 12.5 mg via ORAL
  Filled 2018-11-25: qty 1

## 2018-11-25 NOTE — Progress Notes (Signed)
Physical Therapy Treatment Patient Details Name: [name] MRN: [id number] DOB: 01/23/1925 Today's Date: 11/25/2018    History of Present Illness Pt is a 85 y/o male admitted secondary to sepsis likely from UTI. CT of head negative for acute abnormality. PMH includes PVD, COPD, CKD, CVA, HTN dementia, tobacco abuse, dCHF, and a fib.     PT Comments    Pt continues to required +2 assistance to all aspects of mobility.  SNF placement remains appropriate.    Follow Up Recommendations  SNF;Supervision/Assistance - 24 hour     Equipment Recommendations  None recommended by PT    Recommendations for Other Services       Precautions / Restrictions Precautions Precautions: Fall Restrictions Weight Bearing Restrictions: No Other Position/Activity Restrictions: very HOH (hears better from R ear), appears to be blind in R eye    Mobility  Bed Mobility Overal bed mobility: Needs Assistance Bed Mobility: Supine to Sit     Supine to sit: +2 for physical assistance;Total assist     General bed mobility comments: Max +2 for trunk elevation and total +2 to advance LEs.  Pt able to reach for therapist for support during trunk elevation.    Transfers Overall transfer level: Needs assistance Equipment used: 2 person hand held assist Transfers: Sit to/from [facility] Sit to Stand: +2 physical assistance;Mod assist Stand pivot transfers: +2 physical assistance;Mod assist       General transfer comment: Assist for power up. +2 person assist to stand pivot from bed to recliner.  Ambulation/Gait Ambulation/Gait assistance: (NT)               Stairs             Wheelchair Mobility    Modified Rankin (Stroke Patients Only)       Balance Overall balance assessment: Needs assistance Sitting-balance support: Bilateral upper extremity supported;Feet supported Sitting balance-Leahy Scale: Poor     Standing balance support: No upper extremity  supported;During functional activity Standing balance-Leahy Scale: Zero                              Cognition Arousal/Alertness: Lethargic(more alert sitting edge of bed) Behavior During Therapy: Flat affect Overall Cognitive Status: History of cognitive impairments - at baseline                                 General Comments: Pt moaning throughout session. Occasional breif reply to basic questions. Garbled speech. Was able to state name after multiple attempts at asking (HOH vs cognition?)      Exercises      General Comments        Pertinent Vitals/Pain Pain Assessment: Faces Faces Pain Scale: Hurts even more Pain Location: generalized with movement Pain Descriptors / Indicators: Grimacing;Moaning Pain Intervention(s): Monitored during session;Repositioned    Home Living                      Prior Function            PT Goals (current goals can now be found in the care plan section) Acute Rehab PT Goals Patient Stated Goal: unable to state PT Goal Formulation: Patient unable to participate in goal setting Potential to Achieve Goals: Fair Progress towards PT goals: Progressing toward goals    Frequency    Min 2X/week  PT Plan Current plan remains appropriate    Co-evaluation PT/OT/SLP Co-Evaluation/Treatment: Yes Reason for Co-Treatment: To address functional/ADL transfers   OT goals addressed during session: ADL's and self-care      AM-PAC PT "6 Clicks" Mobility   Outcome Measure  Help needed turning from your back to your side while in a flat bed without using bedrails?: Total Help needed moving from lying on your back to sitting on the side of a flat bed without using bedrails?: Total Help needed moving to and from a bed to a chair (including a wheelchair)?: Total Help needed standing up from a chair using your arms (e.g., wheelchair or bedside chair)?: Total Help needed to walk in hospital room?:  Total Help needed climbing 3-5 steps with a railing? : Total 6 Click Score: 6    End of Session Equipment Utilized During Treatment: Gait belt Activity Tolerance: Patient limited by pain Patient left: in bed;with call bell/phone within reach;with bed alarm set;with nursing/sitter in room Nurse Communication: Mobility status PT Visit Diagnosis: Other abnormalities of gait and mobility (R26.89);Muscle weakness (generalized) (M62.81);Difficulty in walking, not elsewhere classified (R26.2);Unsteadiness on feet (R26.81)     Time: 1144(OT stayed in room for feeding when PTA exited. )-1205 PT Time Calculation (min) (ACUTE ONLY): 21 min  Charges:  $Therapeutic Activity: 8-22 mins                     Governor Rooks, PTA Acute Rehabilitation Services Pager [phone] Office [phone]     Charlene Detter Eli Hose 11/25/2018, 5:05 PM

## 2018-11-25 NOTE — Progress Notes (Signed)
Occupational Therapy Treatment Patient Details Name: [name] MRN: [id number] DOB: 10-26-1924 Today's Date: 11/25/2018    History of present illness Pt is a 85 y/o male admitted secondary to sepsis likely from UTI. CT of head negative for acute abnormality. PMH includes PVD, COPD, CKD, CVA, HTN dementia, tobacco abuse, dCHF, and a fib.    OT comments  Pt was very lethargic laying in bed but became more alert with sitting EOB.  He moans/yells out with all movement but difficult to determine if painful.  Difficult to determine cognition due to [facility]. Pt was interested in eating lunch and was accepting of food. However, he requires total assist majority of time to self-feed.  With [facility] assist and proximal UE support, he will participate in bringing spoon to mouth but requires assist to scoop food and to successfully bring to mouth.  Will continue to follow acutely in order to maximize safety and independence with ADLs. Continue to recommend SNF for discharge planning.  Follow Up Recommendations  SNF    Equipment Recommendations  Other (comment)(defer to next venue)    Recommendations for Other Services      Precautions / Restrictions Precautions Precautions: Fall Restrictions Weight Bearing Restrictions: No Other Position/Activity Restrictions: very HOH (hears better from R ear), appears to be blind in R eye       Mobility Bed Mobility Overal bed mobility: Needs Assistance Bed Mobility: Supine to Sit     Supine to sit: +2 for physical assistance;Total assist        Transfers Overall transfer level: Needs assistance Equipment used: 2 person hand held assist Transfers: Sit to/from [employer] Transfers Sit to Stand: +2 physical assistance;Mod assist Stand pivot transfers: +2 physical assistance;Mod assist       General transfer comment: Assist for power up. +2 person assist to stand pivot from bed to recliner.    Balance Overall balance assessment: Needs  assistance Sitting-balance support: Bilateral upper extremity supported;Feet supported Sitting balance-Leahy Scale: Poor     Standing balance support: No upper extremity supported;During functional activity Standing balance-Leahy Scale: Zero                             ADL either performed or assessed with clinical judgement   ADL Overall ADL's : Needs assistance/impaired Eating/Feeding: Total assistance;Maximal assistance Eating/Feeding Details (indicate cue type and reason): Pt varying between max-total assist. Able to participate in bringing food to mouth (spoon with built up handle) but requires assist to bring spoon all the way to mouth. Assist to hold cup to drink through straw. Grooming: Dance movement psychotherapist;Total assistance                               Functional mobility during ADLs: +2 for physical assistance;Maximal assistance General ADL Comments: Pt completed bed mobility, sat EOB  for approximately 5 minutes, then transferred to recliner to eat lunch.     Vision       Perception     Praxis      Cognition Arousal/Alertness: Lethargic(more alert with sitting EOB) Behavior During Therapy: Flat affect Overall Cognitive Status: History of cognitive impairments - at baseline                                          Exercises  Shoulder Instructions       General Comments      Pertinent Vitals/ Pain       Pain Assessment: Faces Faces Pain Scale: Hurts even more Pain Location: generalized with movement Pain Descriptors / Indicators: Grimacing;Moaning Pain Intervention(s): Monitored during session;Repositioned  Home Living                                          Prior Functioning/Environment              Frequency  Min 2X/week        Progress Toward Goals  OT Goals(current goals can now be found in the care plan section)  Progress towards OT goals: Progressing toward goals  Acute  Rehab OT Goals Patient Stated Goal: unable to state OT Goal Formulation: Patient unable to participate in goal setting Time For Goal Achievement: 12/08/18 Potential to Achieve Goals: Good ADL Goals Pt Will Perform Grooming: with mod assist;sitting Pt Will Perform Upper Body Bathing: with mod assist;sitting Pt Will Perform Lower Body Bathing: with mod assist;sit to/from stand Pt Will Transfer to Toilet: with mod assist;stand pivot transfer;bedside commode Pt Will Perform Toileting - Clothing Manipulation and hygiene: with max assist;sit to/from stand Additional ADL Goal #1: Pt will complete bed mobility at mod A +1 level to prepare for EOB/OOB ADLs.  Plan Discharge plan remains appropriate    Co-evaluation    PT/OT/SLP Co-Evaluation/Treatment: Yes Reason for Co-Treatment: To address functional/ADL transfers;For patient/therapist safety   OT goals addressed during session: ADL's and self-care      AM-PAC OT "6 Clicks" Daily Activity     Outcome Measure   Help from another person eating meals?: Total Help from another person taking care of personal grooming?: Total Help from another person toileting, which includes using toliet, bedpan, or urinal?: Total Help from another person bathing (including washing, rinsing, drying)?: Total Help from another person to put on and taking off regular upper body clothing?: Total Help from another person to put on and taking off regular lower body clothing?: Total 6 Click Score: 6    End of Session Equipment Utilized During Treatment: Gait belt  OT Visit Diagnosis: Unsteadiness on feet (R26.81);Muscle weakness (generalized) (M62.81);Pain;Other symptoms and signs involving cognitive function Pain - part of body: (pt unable to specify)   Activity Tolerance Patient limited by lethargy   Patient Left in chair;with call bell/phone within reach;with chair alarm set;with nursing/sitter in room   Nurse Communication Mobility status(pt needs assist  to finish lunch)        Time: 6237-6283 OT Time Calculation (min): 31 min  Charges: OT General Charges $OT Visit: 1 Visit OT Treatments $Self Care/Home Management : 8-22 mins     Darrol Jump OTR/L [place] [phone] 11/25/2018, 3:59 PM

## 2018-11-25 NOTE — Care Management Important Message (Deleted)
Important Message  Patient Details  Name: SPENSER HARREN MRN: [id number] Date of Birth: [birth year]   Medicare Important Message Given:  Yes  Due to illness patient coyld not sign.   Keiandra Sullenger 11/25/2018, 4:03 PM

## 2018-11-25 NOTE — Care Management Obs Status (Signed)
[facility] NOTIFICATION   Patient Details  Name: [name] MRN: [id number] Date of Birth: 03-Nov-1924   Medicare Observation Status Notification Given:     CORRECTION Due to illness patient not able to sign.   Knolan Simien 11/25/2018, 4:04 PM

## 2018-11-25 NOTE — Progress Notes (Addendum)
Patient ID: [name], male   DOB: December 15, 1924, 85 y.o.   MRN: [id number]  PROGRESS NOTE    [name]  GHW:[id number] DOB: [birth year] DOA: 11/22/2018 PCP: [name], MD   Brief Narrative:  85 year old male with history of depression, CVA, COPD, tobacco abuse, hypertension, chronic diastolic CHF, persistent A. fib, aortic stenosis, hyperlipidemia, nephrolithiasis, PVD, renal cell cancer, chronic kidney disease stage III presented on 11/22/2018 with altered mental status.  Apparently he was recently diagnosed with UTI and started on Keflex on 11/16/2018.  He was admitted for sepsis most likely secondary to UTI and started on broad-spectrum antibiotics.  Assessment & Plan:   Principal Problem:   Sepsis, unspecified organism [facility]) Active Problems:   Hyperlipidemia   Essential hypertension   PERIPHERAL VASCULAR DISEASE   COPD (chronic obstructive pulmonary disease) (HCC)   CKD (chronic kidney disease), stage III (HCC)   History of CVA (cerebrovascular accident)   Smoker unmotivated to quit   Aortic stenosis   Temporal arteritis (HCC)   Elevated lactic acid level   Persistent atrial fibrillation   Chronic diastolic CHF (congestive heart failure) (HCC)   Bradycardia   Acute metabolic encephalopathy   Goals of care, counseling/discussion   Palliative care by specialist  Sepsis: Present on admission -Probably from UTI -Patient was started on cefepime and was also given Flagyl and vancomycin on admission.  Continue cefepime.  Vancomycin and Flagyl have been discontinued. -Hemodynamically improving.  Cultures negative so far.  Discontinue IV fluids.  UTI -Cultures negative so far.  Continue cefepime  Leukocytosis -Not much improvement in white count.  Monitor. -Patient has lower abdomen/suprapubic tenderness.  Will get x-ray of abdomen.  Might need to get CT scan of the abdomen if persistently tender.  Acute metabolic encephalopathy -Mental status has not much improved.   Monitor mental status.  Fall precautions.  CT head was negative for acute intracranial abnormality.  I doubt that doing an MRI of the brain will change current management. -PT recommends SNF placement.  Diet as per SLP recommendations. - trazodone, mirtazapine and citalopram was discontinued on 11/24/2018.  Patient was very restless overnight.  Will reintroduce mirtazapine and citalopram.  Add Seroquel 12.5 mg nightly. -Palliative care has spoken to the patient's family and now the patient is DNR.  Family wants to continue current treatment and if there is no improvement they would like to bring the patient home with hospice.  They would not want SNF placement.   Chronic diastolic CHF/aortic stenosis/pulmonary hypertension -Currently compensated.  Strict input and output.  Daily weights.  Will resume Lasix today.  Last echo on 01/17/2018 showed EF of 50 to 55% with grade 1 diastolic dysfunction  Persistent A. fib -Continue Eliquis  Hyperlipidemia -Outpatient follow-up  Chronic kidney disease stage III -Creatinine stable.  Monitor  COPD--stable.  Continue DuoNeb as needed  Temporal arteritis -Continue prednisone 5 mg daily.  Outpatient follow-up  Failure to thrive--patient was started on Megace as an outpatient by PCP.  Overall oral intake still poor.  Palliative care following.  Plan as above.  Tobacco abuse -Patient needs to stop smoking.   DVT prophylaxis: Eliquis Code Status: Full Family Communication: None at bedside.  Spoke to patient's wife [name] on phone on 11/24/2018  disposition Plan: Home with or without hospice pending improvement.  Consultants: Palliative care  Procedures: None  Antimicrobials:  Cefepime from 11/22/2018 onwards Vancomycin and Flagyl 1 dose on 11/22/2018   Subjective:  Patient seen and examined at bedside.  He is sleepy, hardly wakes up on calling his name.  Intermittently moans.  Poor historian.  No overnight fever or vomiting reported by  nursing staff.  Patient was apparently very restless last night.  Objective: Vitals:   11/24/18 1303 11/24/18 2237 11/25/18 0637 11/25/18 0827  BP: 135/69 (!) 155/78 120/81 (!) 162/70  Pulse: 87 80 84 77  Resp: (!) 24 20 20    Temp: 98.4 F (36.9 C) 98.5 F (36.9 C) 97.7 F (36.5 C)   TempSrc: Axillary Axillary Axillary   SpO2: 93% 96% 98%   Weight:   66.3 kg   Height:        Intake/Output Summary (Last 24 hours) at 11/25/2018 0841 Last data filed at 11/24/2018 1659 Gross per 24 hour  Intake 11035.25 ml  Output -  Net 11035.25 ml   Filed Weights   11/23/18 0300 11/24/18 0539 11/25/18 0637  Weight: 68.3 kg 68.7 kg 66.3 kg    Examination:  General exam: Elderly male lying in bed.  Sleepy, hardly wakes up on calling his name.  Looking.  Intermittently moans  respiratory system: Bilateral decreased breath sounds at bases with some scattered crackles. Cardiovascular system: S1 & S2 heard, Rate controlled Gastrointestinal system: Abdomen is nondistended, soft and tender in the lower quadrant and suprapubic regions.  Normal bowel sounds heard. Extremities: No cyanosis, clubbing, edema     Data Reviewed: I have personally reviewed following labs and imaging studies  CBC: Recent Labs  Lab 11/22/18 1225 11/23/18 0421 11/24/18 0357 11/25/18 0246  WBC 18.7* 17.0* 16.8* 16.3*  NEUTROABS 12.2*  --   --   --   HGB 14.1 13.5 12.5* 12.5*  HCT 44.0 40.2 37.4* 36.8*  MCV 94.8 93.9 93.7 92.5  PLT 293 285 288 034   Basic Metabolic Panel: Recent Labs  Lab 11/22/18 1225 11/22/18 1540 11/23/18 0421 11/24/18 0357 11/25/18 0246  NA 136  --  140 141 141  K 4.0  --  4.2 3.8 4.1  CL 107  --  110 112* 117*  CO2 20*  --  21* 19* 18*  GLUCOSE 100*  --  80 80 122*  BUN 46*  --  43* 38* 42*  CREATININE 1.80*  --  1.68* 1.61* 1.57*  CALCIUM 10.7*  --  10.4* 10.8* 10.9*  MG  --  2.0 1.9 1.8 1.9   GFR: Estimated Creatinine Clearance: 26.1 mL/min (A) (by C-G formula based on SCr of  1.57 mg/dL (H)). Liver Function Tests: Recent Labs  Lab 11/22/18 1225  AST 28  ALT 19  ALKPHOS 91  BILITOT 0.8  PROT 6.4*  ALBUMIN 2.2*   No results for input(s): LIPASE, AMYLASE in the last 168 hours. No results for input(s): AMMONIA in the last 168 hours. Coagulation Profile: Recent Labs  Lab 11/23/18 0421  INR 1.4*   Cardiac Enzymes: No results for input(s): CKTOTAL, CKMB, CKMBINDEX, TROPONINI in the last 168 hours. BNP (last 3 results) No results for input(s): PROBNP in the last 8760 hours. HbA1C: No results for input(s): HGBA1C in the last 72 hours. CBG: No results for input(s): GLUCAP in the last 168 hours. Lipid Profile: Recent Labs    11/22/18 1616  CHOL 133  HDL 51  LDLCALC 62  TRIG 100  CHOLHDL 2.6   Thyroid Function Tests: No results for input(s): TSH, T4TOTAL, FREET4, T3FREE, THYROIDAB in the last 72 hours. Anemia Panel: No results for input(s): VITAMINB12, FOLATE, FERRITIN, TIBC, IRON, RETICCTPCT in the last 72 hours. Sepsis Labs:  Recent Labs  Lab 11/22/18 1225 11/22/18 1500 11/23/18 0421  PROCALCITON  --   --  <0.10  LATICACIDVEN 2.5* 1.4  --     Recent Results (from the past 240 hour(s))  Blood Culture (routine x 2)     Status: None (Preliminary result)   Collection Time: 11/22/18 12:28 PM  Result Value Ref Range Status   Specimen Description BLOOD BLOOD RIGHT FOREARM  Final   Special Requests   Final    BOTTLES DRAWN AEROBIC AND ANAEROBIC Blood Culture adequate volume   Culture   Final    NO GROWTH 3 DAYS Performed at [facility], [place] [address] [place], [place] [address]    Report Status PENDING  Incomplete  Blood Culture (routine x 2)     Status: None (Preliminary result)   Collection Time: 11/22/18 12:28 PM  Result Value Ref Range Status   Specimen Description BLOOD BLOOD LEFT FOREARM  Final   Special Requests   Final    BOTTLES DRAWN AEROBIC AND ANAEROBIC Blood Culture results may not be optimal due to an inadequate  volume of blood received in culture bottles   Culture   Final    NO GROWTH 3 DAYS Performed at [facility], [place] [address], [place], [place] [address]    Report Status PENDING  Incomplete  Urine culture     Status: None   Collection Time: 11/22/18 12:32 PM  Result Value Ref Range Status   Specimen Description URINE, CATHETERIZED  Final   Special Requests NONE  Final   Culture   Final    NO GROWTH Performed at [facility], [place] [address] [place], [place] [address]    Report Status 11/23/2018 FINAL  Final         Radiology Studies: No results found.      Scheduled Meds: . allopurinol  100 mg Oral Daily  . amLODipine  5 mg Oral Daily  . apixaban  2.5 mg Oral BID  . aspirin EC  81 mg Oral Daily  . megestrol  40 mg Oral Daily  . pantoprazole  80 mg Oral Daily  . pravastatin  40 mg Oral QHS  . predniSONE  5 mg Oral Q breakfast   Continuous Infusions: . sodium chloride 50 mL/hr at 11/24/18 1020  . ceFEPime (MAXIPIME) IV 1 g (11/24/18 1255)     LOS: 3 days        Aline August, MD [facility] 11/25/2018, 8:41 AM

## 2018-11-25 NOTE — Progress Notes (Signed)
Daily Progress Note   Patient Name: [name]       Date: 11/25/2018 DOB: 06-10-1925  Age: 85 y.o. MRN#: [id number] Attending Physician: Aline August, MD Primary Care Physician: [name], MD Admit Date: 11/22/2018  Reason for Consultation/Follow-up: Establishing goals of care  Subjective: Patient flutters eyes at me - does not answer questions. Appears restless.  Length of Stay: 3  Current Medications: Scheduled Meds:  . allopurinol  100 mg Oral Daily  . amLODipine  5 mg Oral Daily  . apixaban  2.5 mg Oral BID  . aspirin EC  81 mg Oral Daily  . citalopram  10 mg Oral Daily  . furosemide  40 mg Oral Daily  . megestrol  40 mg Oral Daily  . mirtazapine  15 mg Oral QHS  . pantoprazole  80 mg Oral Daily  . pravastatin  40 mg Oral QHS  . predniSONE  5 mg Oral Q breakfast  . QUEtiapine  12.5 mg Oral QHS    Continuous Infusions: . ceFEPime (MAXIPIME) IV 1 g (11/25/18 1219)    PRN Meds: acetaminophen **OR** acetaminophen, docusate sodium, ipratropium-albuterol, polyethylene glycol, traMADol  Physical Exam     Constitutional:      General: He is not in acute distress. Cardiovascular:     Rate and Rhythm: Normal rate and regular rhythm.  Pulmonary:     Effort: Pulmonary effort is normal.     Breath sounds: Normal breath sounds.  Skin:    General: Skin is warm and dry.  Neurological:     Mental Status: He is lethargic and disoriented.     Comments: Does not speak, restless     Vital Signs: BP (!) 134/99 (BP Location: Left Arm)   Pulse 77   Temp 98.9 F (37.2 C)   Resp 19   Ht 5\' 5"  (1.651 m)   Wt 66.3 kg   SpO2 96%   BMI 24.32 kg/m  SpO2: SpO2: 96 % O2 Device: O2 Device: Room Air O2 Flow Rate:    Intake/output summary:   Intake/Output Summary (Last 24 hours) at  11/25/2018 1439 Last data filed at 11/25/2018 0900 Gross per 24 hour  Intake 10575.25 ml  Output -  Net 10575.25 ml   LBM: Last BM Date: (PTA) Baseline Weight: Weight: 74.4 kg Most recent weight: Weight: 66.3 kg       Palliative Assessment/Data: PPS 40%    Flowsheet Rows     Most Recent Value  Intake Tab  Referral Department  Hospitalist  Unit at Time of Referral  ER  Palliative Care Primary Diagnosis  Sepsis/Infectious Disease  Date Notified  11/22/18  Palliative Care Type  New Palliative care  Reason for referral  Clarify Goals of Care  Date of Admission  11/22/18  Date first seen by Palliative Care  11/23/18  # of days Palliative referral response time  1 Day(s)  # of days IP prior to Palliative referral  0  Clinical Assessment  Palliative Performance Scale Score  40%  Psychosocial & Spiritual Assessment  Palliative Care Outcomes  Patient/Family meeting held?  Yes  Who was at the meeting?  wife and son  Palliative Care Outcomes  Clarified goals  of care, Counseled regarding hospice, Provided psychosocial or spiritual support, Changed CPR status      Patient Active Problem List   Diagnosis Date Noted  . Acute metabolic encephalopathy   . Goals of care, counseling/discussion   . Palliative care by specialist   . Sepsis, unspecified organism ([place]) 11/22/2018  . Solitary kidney, acquired 10/28/2018  . Rib pain on left side 10/28/2018  . Bradycardia 07/15/2018  . Persistent atrial fibrillation 03/08/2018  . Chronic diastolic CHF (congestive heart failure) ([place]) 03/08/2018  . Pressure injury of skin 01/20/2018  . SOB (shortness of breath) 01/19/2018  . Bilateral lower extremity edema 01/19/2018  . Acute kidney injury superimposed on CKD ([place]) 01/19/2018  . Elevated lactic acid level 01/19/2018  . Hyperglycemia 01/19/2018  . Hyponatremia 11/22/2017  . Peripheral edema 11/08/2017  . Thrush 11/08/2017  . Temporal arteritis ([place]) 11/03/2017  . Vision loss, bilateral  10/26/2017  . Weight loss 10/18/2017  . Flank pain 10/18/2017  . Right arm pain 10/18/2017  . Depression 10/18/2017  . Aortic stenosis 02/05/2017  . Heart murmur 01/05/2017  . Skin lesion 01/04/2016  . Low back pain 12/21/2013  . Lumbar radiculitis 12/28/2012  . Smoker unmotivated to quit 11/14/2012  . Cough 11/06/2011  . Leukocytosis 11/06/2011  . History of CVA (cerebrovascular accident) 05/03/2011  . Preventative health care 05/03/2011  . SKIN LESION 11/07/2010  . GOUT 07/04/2010  . CONJUNCTIVITIS, ALLERGIC, CHRONIC 03/24/2010  . GERD 02/23/2010  . Constipation 02/19/2010  . WRIST PAIN, RIGHT 02/26/2009  . BACK PAIN 01/31/2009  . PLANTAR FASCIITIS, RIGHT 09/27/2008  . CHEST PAIN 01/31/2008  . ALLERGIC RHINITIS 09/29/2007  . VERTIGO 09/29/2007  . CKD (chronic kidney disease), stage III ([place]) 08/17/2007  . NEPHROLITHIASIS, HX OF 08/17/2007  . Malignant neoplasm of kidney excluding renal pelvis ([place]) 05/12/2007  . Hyperlipidemia 05/12/2007  . OBESITY 05/12/2007  . Essential hypertension 05/12/2007  . PERIPHERAL VASCULAR DISEASE 05/12/2007  . COPD (chronic obstructive pulmonary disease) ([place]) 05/12/2007  . PEPTIC ULCER DISEASE 05/12/2007  . BENIGN PROSTATIC HYPERTROPHY 05/12/2007    Palliative Care Assessment & Plan   HPI: 85 y.o. male  with past medical history of diastolic CHF, aortic stenosis, a fib, COPD, CVA, GERD, HLD, HTN, PUD, PVD, renal cell cancer, depression, CKD 3, and tobacco use admitted on 11/22/2018 with AMS for 1 week. Treated for sepsis/UTI. Continues to have altered mental status. CT negative. Poor PO intake. PMT consulted for [place].  Assessment: Follow up today with patient - mental status appears unchanged. Still minimally verbal, does not answer questions. Per chart review - very little PO intake, too lethargic to work with SLP.   Spoke with patient's wife and children over speaker phone - updated them that patient remains about the same. They ask about  his abdominal xray and I shared that there were no acute findings in his abdomen. Of note, patient may have CT of abdomen.  We discussed patient not eating. We discussed other means of nutrition - discussed risks/benefits of feeding tubes - family agrees this is NOT something patient would want. We discuss giving his body the support it needs - IV antibiotics, IV fluids - and seeing how he does. They remain hopeful for improvement but are also aware he may not improve.   We again discuss that if patient does not improve they would like to bring him home with hospice. They are ready to bring him home as early as tomorrow with hospice if it is determined by the  medical team that there are no other interventions to offer improvement.   Recommendations/Plan:  Signed DNR placed on chart per conversation 3/26  Family remain hopeful for improvement and agree to continue treatment; however, if medical team determines the treatable has been treated and he is not improving they are ready to bring him home with hospice as soon as 3/28 - noted that patient may receive CT abdomen for further investigation - family would agree to this  NO feeding tube  Code Status:  DNR  Prognosis:   Unable to determine  Discharge Planning:  To Be Determined  Care plan was discussed with patient's wife and children  Thank you for allowing the Palliative Medicine Team to assist in the care of this patient.   Total Time 40 minutes Prolonged Time Billed  no       Greater than 50%  of this time was spent counseling and coordinating care related to the above assessment and plan.  Juel Burrow, DNP, [facility] Palliative Medicine Team Team Phone # [phone]  Pager [phone]

## 2018-11-25 NOTE — Consult Note (Signed)
   [name] CM Inpatient Consult   11/25/2018  [name] 28-Sep-1924 [id number]    Patient was screened for serviceswith [name] care management due to extreme unplanned risk score of 34% for his [facility] Medicare benefits.He had previous [name] care management services.   Chart review reveals that patient was admitted for acute metabolic encephalopathy, sepsis most likely secondary to UTI (started on Keflex on 3/18) and had one fall due to weakness earlier this week. Per history and physical note on 11/22/18 shows as follows: Mr. [name]. Mitch is a 85 y.o. white male, PMHx: Depression, CVA, COPD, tobacco abuse (current) HTN, chronic diastolic CHF, persistent A. fib, aortic stenosis HLD, nephrolithiasis, PVD, renal cell cancer, CKD stage III.   Patient comes from home where he lives with his wife. Patient's current disposition is to be determined at this time.   Have reviewed PT recommendation for SNF placement and also notes from palliative care consult.  Will follow for progression and disposition of needs as appropriate.  Please place a [facility] Care Management consult if needs arise for community follow-up as appropriate.   For questions and referral, please contact:   Tnia Anglada A. Ifeoluwa Bartz, BSN, RN-BC [facility] Liaison Cell: [phone]

## 2018-11-25 NOTE — Progress Notes (Signed)
SLP Cancellation Note  Patient Details Name: [name] MRN: [id number] DOB: Feb 01, 1925   Cancelled treatment:       Reason Eval/Treat Not Completed: Patient's level of consciousness. Could not arouse pt for PO intake this am. NT reports she he was not interested in breakfast, but ate well yesterday. Will f/u next week, follow current diet at this time when alert.   Herbie Baltimore, MA CCC-SLP  Acute Rehabilitation Services Pager [phone] Office [phone]  Lynann Beaver 11/25/2018, 10:02 AM

## 2018-11-25 NOTE — Progress Notes (Signed)
Nutrition Brief Note  RD working remotely - Late Entry  Pt identified on the Malnutrition Screening Tool Report. Spoke with Darol Destine, DNP with Palliative Medicine Team. Pt's family does not desire any artifical feeding.  Pt is not fully comfort care but likely going home with Hospice. No nutrition interventions warranted at this time.  Please consult as needed.   Arthur Holms, RD, LDN Pager #: [phone] After-Hours Pager #: [phone]

## 2018-11-26 ENCOUNTER — Telehealth: Payer: Self-pay | Admitting: Family Medicine

## 2018-11-26 LAB — BASIC METABOLIC PANEL
Anion gap: 7 (ref 5–15)
BUN: 41 mg/dL — ABNORMAL HIGH (ref 8–23)
CO2: 20 mmol/L — AB (ref 22–32)
Calcium: 11.3 mg/dL — ABNORMAL HIGH (ref 8.9–10.3)
Chloride: 119 mmol/L — ABNORMAL HIGH (ref 98–111)
Creatinine, Ser: 1.55 mg/dL — ABNORMAL HIGH (ref 0.61–1.24)
GFR calc Af Amer: 44 mL/min — ABNORMAL LOW (ref >60)
GFR calc non Af Amer: 38 mL/min — ABNORMAL LOW (ref >60)
Glucose, Bld: 91 mg/dL (ref 70–99)
Potassium: 3.8 mmol/L (ref 3.5–5.1)
SODIUM: 146 mmol/L — AB (ref 135–145)

## 2018-11-26 LAB — CBC
HCT: 34.3 % — ABNORMAL LOW (ref 39.0–52.0)
Hemoglobin: 11.4 g/dL — ABNORMAL LOW (ref 13.0–17.0)
MCH: 30.2 pg (ref 26.0–34.0)
MCHC: 33.2 g/dL (ref 30.0–36.0)
MCV: 91 fL (ref 80.0–100.0)
Platelets: 290 10*3/uL (ref 150–400)
RBC: 3.77 MIL/uL — ABNORMAL LOW (ref 4.22–5.81)
RDW: 14.9 % (ref 11.5–15.5)
WBC: 16.5 10*3/uL — ABNORMAL HIGH (ref 4.0–10.5)
nRBC: 0 % (ref 0.0–0.2)

## 2018-11-26 LAB — MAGNESIUM: Magnesium: 1.8 mg/dL (ref 1.7–2.4)

## 2018-11-26 MED ORDER — MORPHINE SULFATE (CONCENTRATE) 10 MG /0.5 ML PO SOLN: 5.0000 mg | ORAL | 0 refills | Status: AC | PRN

## 2018-11-26 MED ORDER — QUETIAPINE FUMARATE 25 MG PO TABS: 12.5000 mg | ORAL_TABLET | Freq: Every day | ORAL | 0 refills | Status: AC

## 2018-11-26 MED ORDER — HALOPERIDOL 0.5 MG PO TABS: 0.5000 mg | ORAL_TABLET | Freq: Three times a day (TID) | ORAL | 0 refills | Status: AC | PRN

## 2018-11-26 MED ORDER — LORAZEPAM 0.5 MG PO TABS: 0.5000 mg | ORAL_TABLET | Freq: Three times a day (TID) | ORAL | 0 refills | Status: AC | PRN

## 2018-11-26 NOTE — Care Management (Signed)
Spoke w patient's wife Izora Gala on the phone. She was given choice of home hospice companies, and would like to use [facility]. Referral placed to [facility] for services and hospital bed.  Izora Gala states that she and her daughter live with patient and patient will have 24 hour support at home. She states that the hospital bed can be delivered today or tomorrow, they are clearing another room at home for it. Pt has rollator, RW and potty chair at home. Izora Gala is ready at home for patient at any time.   Spoke to [employer] bedside who will: 1. Verify patient has Gold DNR form and send home w patient. 2. Call PTAR at [phone] opt 1, opt 3 when ready to DC 3. Provide PTAR with DNR form and Medical Necessity and Face sheet that were printed to 5W computer when they arrive to pick up patient. 4. Call wife to review DC instructions.  5. Please also send patient with bedpan.

## 2018-11-26 NOTE — Progress Notes (Signed)
Daily Progress Note   Patient Name: [name]       Date: 11/26/2018 DOB: Dec 31, 1924  Age: 85 y.o. MRN#: [id number] Attending Physician: No att. providers found Primary Care Physician: [name], MD Admit Date: 11/22/2018  Reason for Consultation/Follow-up: Establishing goals of care  Subjective: Does not respond to questions or follow commands. Appears restless. Per nursing staff, would not eat breakfast.  Length of Stay: 4  Current Medications: Scheduled Meds:  . allopurinol  100 mg Oral Daily  . amLODipine  5 mg Oral Daily  . apixaban  2.5 mg Oral BID  . aspirin EC  81 mg Oral Daily  . citalopram  10 mg Oral Daily  . furosemide  40 mg Oral Daily  . megestrol  40 mg Oral Daily  . mirtazapine  15 mg Oral QHS  . pantoprazole  80 mg Oral Daily  . pravastatin  40 mg Oral QHS  . predniSONE  5 mg Oral Q breakfast  . QUEtiapine  12.5 mg Oral QHS    Continuous Infusions: . ceFEPime (MAXIPIME) IV 1 g (11/25/18 1219)    PRN Meds: acetaminophen **OR** acetaminophen, docusate sodium, ipratropium-albuterol, polyethylene glycol, traMADol  Physical Exam     Constitutional:      General: He is not in acute distress. Cardiovascular:     Rate and Rhythm: Normal rate and regular rhythm.  Pulmonary:     Effort: Pulmonary effort is normal.     Breath sounds: Normal breath sounds.  Skin:    General: Skin is warm and dry.  Neurological:     Mental Status: He is lethargic and disoriented.     Comments: Does not speak, restless     Vital Signs: BP (!) 137/42 (BP Location: Left Arm)   Pulse 82   Temp 98.9 F (37.2 C) (Oral)   Resp (!) 24   Ht 5\' 5"  (1.651 m)   Wt 71.2 kg   SpO2 91%   BMI 26.13 kg/m  SpO2: SpO2: 91 % O2 Device: O2 Device: Room Air O2 Flow Rate:    Intake/output  summary:   Intake/Output Summary (Last 24 hours) at 11/26/2018 1416 Last data filed at 11/26/2018 1256 Gross per 24 hour  Intake 290 ml  Output 600 ml  Net -310 ml   LBM: Last BM Date: (PTA) Baseline Weight: Weight: 74.4 kg Most recent weight: Weight: 71.2 kg       Palliative Assessment/Data: PPS 20%    Flowsheet Rows     Most Recent Value  Intake Tab  Referral Department  Hospitalist  Unit at Time of Referral  ER  Palliative Care Primary Diagnosis  Sepsis/Infectious Disease  Date Notified  11/22/18  Palliative Care Type  New Palliative care  Reason for referral  Clarify Goals of Care  Date of Admission  11/22/18  Date first seen by Palliative Care  11/23/18  # of days Palliative referral response time  1 Day(s)  # of days IP prior to Palliative referral  0  Clinical Assessment  Palliative Performance Scale Score  40%  Psychosocial & Spiritual Assessment  Palliative Care Outcomes  Patient/Family meeting held?  Yes  Who was at the meeting?  wife  and son  Palliative Care Outcomes  Clarified goals of care, Counseled regarding hospice, Provided psychosocial or spiritual support, Changed CPR status      Patient Active Problem List   Diagnosis Date Noted  . Acute metabolic encephalopathy   . Goals of care, counseling/discussion   . Palliative care by specialist   . Sepsis, unspecified organism ([place]) 11/22/2018  . Solitary kidney, acquired 10/28/2018  . Rib pain on left side 10/28/2018  . Bradycardia 07/15/2018  . Persistent atrial fibrillation 03/08/2018  . Chronic diastolic CHF (congestive heart failure) ([place]) 03/08/2018  . Pressure injury of skin 01/20/2018  . SOB (shortness of breath) 01/19/2018  . Bilateral lower extremity edema 01/19/2018  . Acute kidney injury superimposed on CKD ([place]) 01/19/2018  . Elevated lactic acid level 01/19/2018  . Hyperglycemia 01/19/2018  . Hyponatremia 11/22/2017  . Peripheral edema 11/08/2017  . Thrush 11/08/2017  . Temporal  arteritis ([place]) 11/03/2017  . Vision loss, bilateral 10/26/2017  . Weight loss 10/18/2017  . Flank pain 10/18/2017  . Right arm pain 10/18/2017  . Depression 10/18/2017  . Aortic stenosis 02/05/2017  . Heart murmur 01/05/2017  . Skin lesion 01/04/2016  . Low back pain 12/21/2013  . Lumbar radiculitis 12/28/2012  . Smoker unmotivated to quit 11/14/2012  . Cough 11/06/2011  . Leukocytosis 11/06/2011  . History of CVA (cerebrovascular accident) 05/03/2011  . Preventative health care 05/03/2011  . SKIN LESION 11/07/2010  . GOUT 07/04/2010  . CONJUNCTIVITIS, ALLERGIC, CHRONIC 03/24/2010  . GERD 02/23/2010  . Constipation 02/19/2010  . WRIST PAIN, RIGHT 02/26/2009  . BACK PAIN 01/31/2009  . PLANTAR FASCIITIS, RIGHT 09/27/2008  . CHEST PAIN 01/31/2008  . ALLERGIC RHINITIS 09/29/2007  . VERTIGO 09/29/2007  . CKD (chronic kidney disease), stage III ([place]) 08/17/2007  . NEPHROLITHIASIS, HX OF 08/17/2007  . Malignant neoplasm of kidney excluding renal pelvis ([place]) 05/12/2007  . Hyperlipidemia 05/12/2007  . OBESITY 05/12/2007  . Essential hypertension 05/12/2007  . PERIPHERAL VASCULAR DISEASE 05/12/2007  . COPD (chronic obstructive pulmonary disease) ([place]) 05/12/2007  . PEPTIC ULCER DISEASE 05/12/2007  . BENIGN PROSTATIC HYPERTROPHY 05/12/2007    Palliative Care Assessment & Plan   HPI: 85 y.o. male  with past medical history of diastolic CHF, aortic stenosis, a fib, COPD, CVA, GERD, HLD, HTN, PUD, PVD, renal cell cancer, depression, CKD 3, and tobacco use admitted on 11/22/2018 with AMS for 1 week. Treated for sepsis/UTI. Continues to have altered mental status. CT negative. Poor PO intake. PMT consulted for [place].  Assessment: Follow up today with patient - mental status appears unchanged. Non-verbal today, does not follow commands. CNA at bedside reports he refused all PO intake.   Dr. Starla Link has arranged for discharge today with hospice support.  Spoke with patient's wife and and  daughter over the phone - updated them that patient remains about the same - nonverbal, refusing PO intake.  They share that they are aware patient is being discharged today and are waiting on hospital bed to be delivered to house. We discussed type of support provided by hospice. They express understanding.    Recommendations/Plan:  Signed DNR placed on chart per conversation 3/26  Family agree to bring patient home with hospice support and understand patient is likely nearing end of life  NO feeding tube  Code Status:  DNR  Prognosis:   < 2 weeks  Discharge Planning:  Home with Hospice  Care plan was discussed with patient's wife and daughter  Thank you for allowing the Palliative  Medicine Team to assist in the care of this patient.   Total Time 25 minutes Prolonged Time Billed  no       Greater than 50%  of this time was spent counseling and coordinating care related to the above assessment and plan.  Juel Burrow, DNP, [facility] Palliative Medicine Team Team Phone # [phone]  Pager [phone]

## 2018-11-26 NOTE — Progress Notes (Signed)
Pt very sleepy not easily aroused. Will not take anything by mouth. All morning med's were not given. Doctor notified.  Plan of care is home with hospice. Will continue to monitor.Marland Kitchen

## 2018-11-26 NOTE — Progress Notes (Signed)
Patient's wife Greggory Safranek called for updates; spoke with wife by phone at [phone].  Informed Ms. Repsher patient was restless, that NTs were currently bathing patient and patient would be receiving medications soon after bath in order to settle him in for the night. Ms. Musgrave inquired about WBC count and provided most recent values from 3/26 and 3/27 and encouraged her to follow up with attending physician about lab values, prognosis, status and other detailed information. Encouraged Ms. Olveda to call during day if possible to receive updates and will inform day RN that patient would like updates.  Ms. Mccarey stated she has spoken with NP Elicia Lamp about possible discharge plan home with hospice and stated that Elicia Lamp indicated that she will follow up with her on 3/28 at 1300.

## 2018-11-26 NOTE — Telephone Encounter (Signed)
I received a message from Dr Jenny Reichmann advising that it was ok for him to be listed as the attending and also ok for the hospice provider to handle symptomatic medications. I call the hospice company back and advised of this. They verbalized understanding. I will forward this to Dr Jenny Reichmann.

## 2018-11-26 NOTE — Discharge Summary (Signed)
Physician Discharge Summary  [name] CZY:[id number] DOB: August 15, 1925 DOA: 11/22/2018  PCP: [name], MD  Admit date: 11/22/2018 Discharge date: 11/26/2018  Admitted From: Home Disposition: Home with hospice  Recommendations for Outpatient Follow-up:  1. Follow up with home hospice at earliest [place]: Home hospice Equipment/Devices: None  Discharge Condition: Poor CODE STATUS: DNR Diet recommendation: As per comfort measures  Brief/Interim Summary: 85 year old male with history of depression, CVA, COPD, tobacco abuse, hypertension, chronic diastolic CHF, persistent A. fib, aortic stenosis, hyperlipidemia, nephrolithiasis, PVD, renal cell cancer, chronic kidney disease stage III presented on 11/22/2018 with altered mental status.  Apparently he was recently diagnosed with UTI and started on Keflex on 11/16/2018.  He was admitted for sepsis most likely secondary to UTI and started on broad-spectrum antibiotics.  During the hospitalization, his overall condition including mental status does not improve.  Palliative care was consulted.  Since his condition has not improved, wife has agreed that patient will be discharged home with hospice.  Discharge home with hospice once arrangements have been made.  Discharge Diagnoses:  Principal Problem:   Sepsis, unspecified organism [facility]) Active Problems:   Hyperlipidemia   Essential hypertension   PERIPHERAL VASCULAR DISEASE   COPD (chronic obstructive pulmonary disease) (H[facility])   CKD (chronic kidney disease), stage III (H[facility])   History of CVA (cerebrovascular accident)   Smoker unmotivated to quit   Aortic stenosis   Temporal arteritis ([facility])   Elevated lactic acid level   Persistent atrial fibrillation   Chronic diastolic CHF (congestive heart failure) ([facility])   Bradycardia   Acute metabolic encephalopathy   Goals of care, counseling/discussion   Palliative care by specialist   Sepsis: Present on  admission -Sepsis has resolved.  Cultures negative so far  UTI  Leukocytosis  Acute metabolic encephalopathy  Chronic diastolic CHF/aortic stenosis/pulmonary hypertension  Persistent A. fib  Hyperlipidemia  Chronic kidney disease stage III  COPD  Temporal arteritis  Failure to thrive  Tobacco abuse   Plan -Patient was treated with broad-spectrum antibiotics initially and subsequently with cefepime.  Cultures have been negative so far.  He is overall medical condition and mental status did not improve.  He has been mostly drowsy with very poor oral intake.  PT/OT recommend SNF.  Palliative care was consulted.  Because of his overall worsening general condition and no improvement in mental status, patient's wife has decided that he will be discharged home with hospice.  Some of the medications including Eliquis have been discontinued.  I have added Seroquel scheduled.  Will discharge home once home hospice has been arranged.  Discharge Instructions   Allergies as of 11/26/2018      Reactions   Sulfonamide Derivatives Rash      Medication List    STOP taking these medications   allopurinol 100 MG tablet Commonly known as:  ZYLOPRIM   cephALEXin 500 MG capsule Commonly known as:  KEFLEX   Eliquis 2.5 MG Tabs tablet Generic drug:  apixaban   meclizine 12.5 MG tablet Commonly known as:  ANTIVERT   pravastatin 40 MG tablet Commonly known as:  PRAVACHOL   predniSONE 5 MG tablet Commonly known as:  DELTASONE     TAKE these medications   amLODipine 5 MG tablet Commonly known as:  NORVASC TAKE 1 TABLET (5 MG TOTAL) BY MOUTH DAILY.   cetirizine 10 MG tablet Commonly known as:  ZYRTEC Take 10 mg by mouth daily as needed for allergies.   citalopram  10 MG tablet Commonly known as:  CeleXA Take 1 tablet (10 mg total) by mouth daily.   docusate sodium 100 MG capsule Commonly known as:  COLACE Take 200 mg by mouth daily as needed for mild  constipation.   furosemide 20 MG tablet Commonly known as:  LASIX Take 2 tablets (40 mg total) by mouth daily.   haloperidol 0.5 MG tablet Commonly known as:  HALDOL Take 1 tablet (0.5 mg total) by mouth 3 (three) times daily as needed for agitation.   LORazepam 0.5 MG tablet Commonly known as:  Ativan Take 1 tablet (0.5 mg total) by mouth every 8 (eight) hours as needed for anxiety.   megestrol 40 MG tablet Commonly known as:  MEGACE Take 1 tablet (40 mg total) by mouth daily.   mirtazapine 15 MG tablet Commonly known as:  REMERON Take 15 mg by mouth at bedtime.   morphine CONCENTRATE 10 mg / 0.5 ml concentrated solution Take 0.25 mLs (5 mg total) by mouth every 2 (two) hours as needed for moderate pain, severe pain or shortness of breath.   omeprazole 40 MG capsule Commonly known as:  PRILOSEC TAKE 1 CAPSULE EVERY DAY   polyethylene glycol packet Commonly known as:  MIRALAX / GLYCOLAX Take 17 g by mouth daily as needed for mild constipation.   QUEtiapine 25 MG tablet Commonly known as:  SEROQUEL Take 0.5 tablets (12.5 mg total) by mouth at bedtime.      Follow-up Information    Home hospice Follow up.   Why:  At earliest convenience         Allergies  Allergen Reactions  . Sulfonamide Derivatives Rash    Consultations:  Palliative care   Procedures/Studies: Dg Chest 2 View  Result Date: 10/29/2018 CLINICAL DATA:  Left-sided chest pain for 7 days. EXAM: CHEST - 2 VIEW COMPARISON:  01/19/2018 FINDINGS: The cardiac silhouette, mediastinal and hilar contours are within normal limits and stable. There is moderate tortuosity and calcification of the thoracic aorta. Mild chronic underlying bronchitic type lung changes but no definite infiltrates or effusions. No worrisome pulmonary lesions. There is a displaced left seventh rib fracture likely accounting for the patient's left-sided chest pain. I do not see a left-sided pleural effusion or pneumothorax. No other  definite rib fractures. IMPRESSION: Left seventh rib fracture, likely accounting for the patient's left-sided chest pain. No pleural effusion or pneumothorax. No infiltrates or worrisome pulmonary lesions. Electronically Signed   By: Marijo Sanes M.D.   On: 10/29/2018 15:14   Ct Head Wo Contrast  Result Date: 11/22/2018 CLINICAL DATA:  Confusion and disorientation EXAM: CT HEAD WITHOUT CONTRAST TECHNIQUE: Contiguous axial images were obtained from the base of the skull through the vertex without intravenous contrast. COMPARISON:  Head CT October 25, 2017 and brain MRI October 26, 2017 FINDINGS: Brain: There is moderate diffuse atrophy. There is no intracranial mass, hemorrhage, extra-axial fluid collection, or midline shift. There is extensive small vessel disease throughout the centra semiovale bilaterally. There is small vessel disease in each internal capsule. No acute appearing infarct is demonstrable. Vascular: There is no appreciable hyperdense vessel. There is calcification in each carotid siphon region. Skull: Bony calvarium appears intact. Sinuses/Orbits: There is mucosal thickening in several ethmoid air cells. Other visualized paranasal sinuses are clear. Orbits appear symmetric bilaterally. Other: Mastoid air cells are clear. IMPRESSION: Atrophy with extensive supratentorial small vessel disease. No acute infarct evident. No mass or hemorrhage. There are foci of arterial vascular calcification. There is mucosal  thickening in several ethmoid air cells. Electronically Signed   By: Lowella Grip III M.D.   On: 11/22/2018 13:46   Dg Chest Port 1 View  Result Date: 11/22/2018 CLINICAL DATA:  Altered mental status EXAM: PORTABLE CHEST 1 VIEW COMPARISON:  10/28/2018 FINDINGS: The heart size and mediastinal contours are within normal limits. Both lungs are clear. The visualized skeletal structures are unremarkable. IMPRESSION: No active disease. Electronically Signed   By: Kathreen Devoid   On:  11/22/2018 12:30   Dg Abd Acute 2+v W 1v Chest  Result Date: 11/25/2018 CLINICAL DATA:  Abdominal pain. EXAM: DG ABDOMEN ACUTE W/ 1V CHEST COMPARISON:  Single-view of the chest 11/22/2018. PA and lateral chest 10/28/2018. FINDINGS: Single-view of the chest demonstrates cardiomegaly. Bilateral interstitial opacities are identified. No pneumothorax or pleural fluid. Known left seventh rib fracture is better seen on the prior studies. No acute bony abnormality. Two views of the abdomen show no free intraperitoneal air. The bowel gas pattern is nonobstructive. There is convex left scoliosis. Atherosclerosis is noted. IMPRESSION: Bilateral alveolar and interstitial opacities have an appearance most compatible with pulmonary edema in this patient with cardiomegaly. Atherosclerosis. No acute finding in the abdomen. Atherosclerosis. Electronically Signed   By: Inge Rise M.D.   On: 11/25/2018 10:39       Subjective: Patient seen and examined at bedside.  He hardly wakes up on calling his name.  As per nursing staff, patient has been restless overnight intermittently.  No overnight fever or vomiting.  Discharge Exam: Vitals:   11/26/18 0700 11/26/18 0847  BP: 128/65 (!) 156/78  Pulse: (!) 134 (!) 138  Resp:  (!) 22  Temp: 99.3 F (37.4 C) 99.4 F (37.4 C)  SpO2:  92%   Vitals:   11/25/18 1254 11/25/18 2228 11/26/18 0700 11/26/18 0847  BP: (!) 134/99 125/62 128/65 (!) 156/78  Pulse: 77 79 (!) 134 (!) 138  Resp: 19 18  (!) 22  Temp: 98.9 F (37.2 C) 98.7 F (37.1 C) 99.3 F (37.4 C) 99.4 F (37.4 C)  TempSrc:  Axillary Axillary   SpO2:  94%  92%  Weight:   71.2 kg   Height:        General: Pt is very sleepy, hardly wakes up on calling his name.  Elderly male lying in bed.  Looks ill Cardiovascular: Intermittently tachycardic, S1/S2 + Respiratory: bilateral decreased breath sounds at bases with scattered crackles Abdominal: Soft, NT, ND, bowel sounds + Extremities: Trace edema,  no cyanosis    The results of significant diagnostics from this hospitalization (including imaging, microbiology, ancillary and laboratory) are listed below for reference.     Microbiology: Recent Results (from the past 240 hour(s))  Blood Culture (routine x 2)     Status: None (Preliminary result)   Collection Time: 11/22/18 12:28 PM  Result Value Ref Range Status   Specimen Description BLOOD BLOOD RIGHT FOREARM  Final   Special Requests   Final    BOTTLES DRAWN AEROBIC AND ANAEROBIC Blood Culture adequate volume   Culture   Final    NO GROWTH 3 DAYS Performed at [facility], [address], [place], [place] [address]    Report Status PENDING  Incomplete  Blood Culture (routine x 2)     Status: None (Preliminary result)   Collection Time: 11/22/18 12:28 PM  Result Value Ref Range Status   Specimen Description BLOOD BLOOD LEFT FOREARM  Final   Special Requests   Final  BOTTLES DRAWN AEROBIC AND ANAEROBIC Blood Culture results may not be optimal due to an inadequate volume of blood received in culture bottles   Culture   Final    NO GROWTH 3 DAYS Performed at [facility], [place] [address] [place], [place] [address]    Report Status PENDING  Incomplete  Urine culture     Status: None   Collection Time: 11/22/18 12:32 PM  Result Value Ref Range Status   Specimen Description URINE, CATHETERIZED  Final   Special Requests NONE  Final   Culture   Final    NO GROWTH Performed at [facility], [place] [address] [place], [place] [address]    Report Status 11/23/2018 FINAL  Final     Labs: BNP (last 3 results) Recent Labs    11/27/17 0714 01/19/18 1444  BNP 594.0* 585.2*   Basic Metabolic Panel: Recent Labs  Lab 11/22/18 1225 11/22/18 1540 11/23/18 0421 11/24/18 0357 11/25/18 0246 11/26/18 0325  NA 136  --  140 141 141 146*  K 4.0  --  4.2 3.8 4.1 3.8  CL 107  --  110 112* 117* 119*  CO2 20*  --  21* 19* 18* 20*  GLUCOSE 100*  --  80 80 122* 91   BUN 46*  --  43* 38* 42* 41*  CREATININE 1.80*  --  1.68* 1.61* 1.57* 1.55*  CALCIUM 10.7*  --  10.4* 10.8* 10.9* 11.3*  MG  --  2.0 1.9 1.8 1.9 1.8   Liver Function Tests: Recent Labs  Lab 11/22/18 1225  AST 28  ALT 19  ALKPHOS 91  BILITOT 0.8  PROT 6.4*  ALBUMIN 2.2*   No results for input(s): LIPASE, AMYLASE in the last 168 hours. No results for input(s): AMMONIA in the last 168 hours. CBC: Recent Labs  Lab 11/22/18 1225 11/23/18 0421 11/24/18 0357 11/25/18 0246 11/26/18 0325  WBC 18.7* 17.0* 16.8* 16.3* 16.5*  NEUTROABS 12.2*  --   --   --   --   HGB 14.1 13.5 12.5* 12.5* 11.4*  HCT 44.0 40.2 37.4* 36.8* 34.3*  MCV 94.8 93.9 93.7 92.5 91.0  PLT 293 285 288 285 290   Cardiac Enzymes: No results for input(s): CKTOTAL, CKMB, CKMBINDEX, TROPONINI in the last 168 hours. BNP: Invalid input(s): POCBNP CBG: No results for input(s): GLUCAP in the last 168 hours. D-Dimer No results for input(s): DDIMER in the last 72 hours. Hgb A1c No results for input(s): HGBA1C in the last 72 hours. Lipid Profile No results for input(s): CHOL, HDL, LDLCALC, TRIG, CHOLHDL, LDLDIRECT in the last 72 hours. Thyroid function studies No results for input(s): TSH, T4TOTAL, T3FREE, THYROIDAB in the last 72 hours.  Invalid input(s): FREET3 Anemia work up No results for input(s): VITAMINB12, FOLATE, FERRITIN, TIBC, IRON, RETICCTPCT in the last 72 hours. Urinalysis    Component Value Date/Time   COLORURINE YELLOW 11/22/2018 1232   APPEARANCEUR CLEAR 11/22/2018 1232   LABSPEC 1.020 11/22/2018 1232   PHURINE 6.0 11/22/2018 1232   GLUCOSEU NEGATIVE 11/22/2018 1232   GLUCOSEU NEGATIVE 11/15/2018 1535   HGBUR TRACE (A) 11/22/2018 1232   BILIRUBINUR NEGATIVE 11/22/2018 1232   KETONESUR NEGATIVE 11/22/2018 1232   PROTEINUR 100 (A) 11/22/2018 1232   UROBILINOGEN 0.2 11/15/2018 1535   NITRITE NEGATIVE 11/22/2018 1232   LEUKOCYTESUR NEGATIVE 11/22/2018 1232   Sepsis Labs Invalid  input(s): PROCALCITONIN,  WBC,  LACTICIDVEN Microbiology Recent Results (from the past 240 hour(s))  Blood Culture (routine x  2)     Status: None (Preliminary result)   Collection Time: 11/22/18 12:28 PM  Result Value Ref Range Status   Specimen Description BLOOD BLOOD RIGHT FOREARM  Final   Special Requests   Final    BOTTLES DRAWN AEROBIC AND ANAEROBIC Blood Culture adequate volume   Culture   Final    NO GROWTH 3 DAYS Performed at [facility], [address], [place], [place] [address]    Report Status PENDING  Incomplete  Blood Culture (routine x 2)     Status: None (Preliminary result)   Collection Time: 11/22/18 12:28 PM  Result Value Ref Range Status   Specimen Description BLOOD BLOOD LEFT FOREARM  Final   Special Requests   Final    BOTTLES DRAWN AEROBIC AND ANAEROBIC Blood Culture results may not be optimal due to an inadequate volume of blood received in culture bottles   Culture   Final    NO GROWTH 3 DAYS Performed at [facility], [place] [address], [place], [place] [address]    Report Status PENDING  Incomplete  Urine culture     Status: None   Collection Time: 11/22/18 12:32 PM  Result Value Ref Range Status   Specimen Description URINE, CATHETERIZED  Final   Special Requests NONE  Final   Culture   Final    NO GROWTH Performed at [facility], [place] [address], [place], [place] [address]    Report Status 11/23/2018 FINAL  Final     Time coordinating discharge: 35 minutes  SIGNED:   Aline August, MD  [facility] 11/26/2018, 9:25 AM

## 2018-11-26 NOTE — Telephone Encounter (Signed)
Received call from team health. They connected me to the hospice provider. They noted the patient was being discharged from the hospital today on hospice and they wanted to know if Dr Jenny Reichmann would be the attending of record for the hospice patient. I advised that I did not know how Dr Jenny Reichmann would like to proceed with this, though I would reach out to him to check. The person I spoke with advised that she could be reached at [phone] until 6 pm and that the patient would be fine to start hospice tomorrow morning once I heard back.

## 2018-11-27 LAB — CULTURE, BLOOD (ROUTINE X 2)
Culture: NO GROWTH
Culture: NO GROWTH
Special Requests: ADEQUATE

## 2018-11-28 ENCOUNTER — Telehealth: Payer: Self-pay | Admitting: *Deleted

## 2018-11-28 NOTE — Telephone Encounter (Signed)
Pt was on TCM report admitted 11/22/18 for sepsis most likely secondary to UTI and started on broad-spectrum antibiotics.  During the hospitalization, his overall condition including mental status did not improve. Pt D/C 11/26/18 to home with hospice services.Marland KitchenJohny Chavez

## 2018-11-28 NOTE — Consult Note (Signed)
   [facility] CM Inpatient Consult   11/28/2018  [name] 1924-11-29 [id number]    Follow-up Note:  Noted disposition for patient per palliative care, was home with home hospice care.  Per transition of care CM note, patient's wife [name]) chose to use [facility]) and referral was made for services and hospital bed. Patient's wife and her daughter live with patient and  will provide 24 hour support at home.   No identifiable needs from [facility] at this point.

## 2018-11-30 DEATH — deceased

## 2018-12-02 ENCOUNTER — Telehealth: Payer: Self-pay | Admitting: Internal Medicine

## 2018-12-02 NOTE — Telephone Encounter (Incomplete)
12/02/18 Received D/C from [name] will take to [place] to sign [facility]. PWR

## 2018-12-05 ENCOUNTER — Telehealth: Payer: Self-pay | Admitting: *Deleted

## 2018-12-05 NOTE — Telephone Encounter (Addendum)
Received original D/C from Dr.John-D/C mailed to [place] [facility]. *Printed D/C paper appears original, however displays no watermark.

## 2019-03-17 ENCOUNTER — Ambulatory Visit: Payer: Medicare HMO | Admitting: Internal Medicine

## 2019-06-12 IMAGING — DX DG CHEST 2V
2 series · 2 of 2 positions shown · non-contrast
Comparison: 12/30/2016 and 11/14/2012 chest x-ray. 03/20/2008 chest
CT.

CLINICAL DATA: [AGE] male with recent weight loss. Shortness
breath. Right-sided chest and back pain intermittent over the past 2
months. Smoker. Hypertension. Renal cell carcinoma post nephrectomy
5994. Initial encounter.

EXAM:
CHEST  2 VIEW

[chest pa]
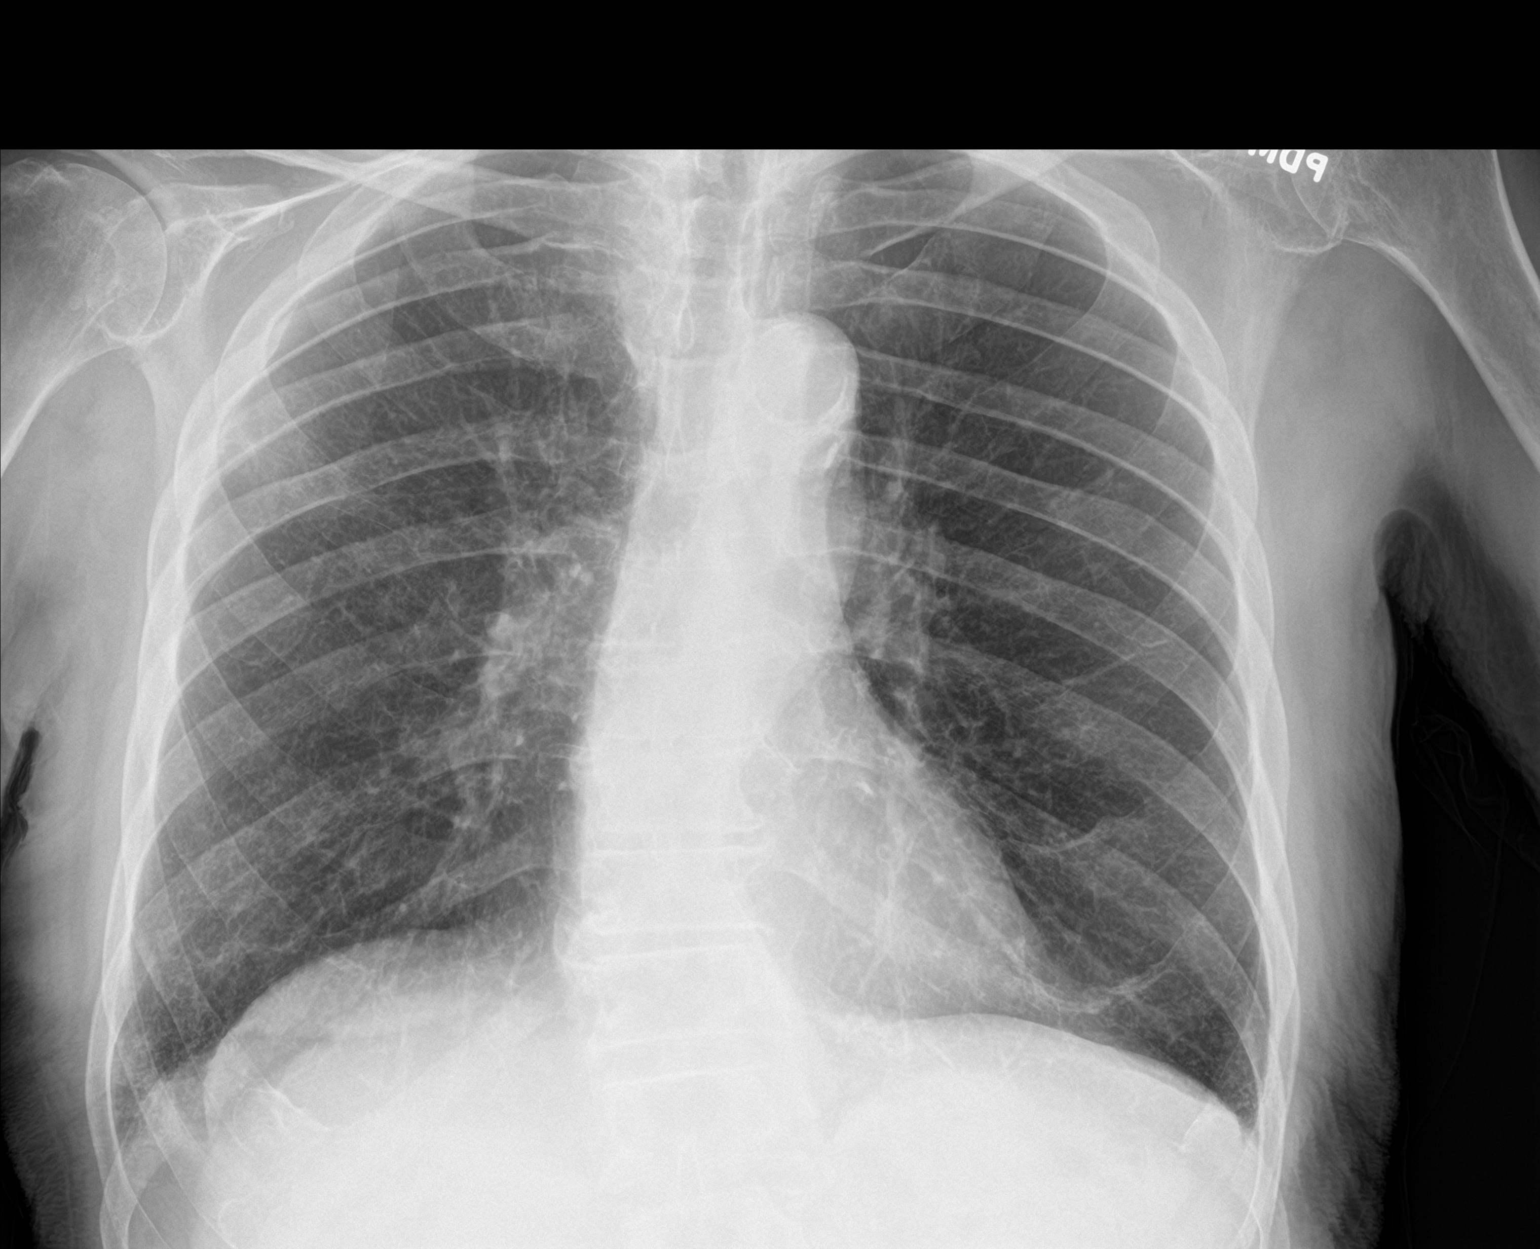

[chest lat]
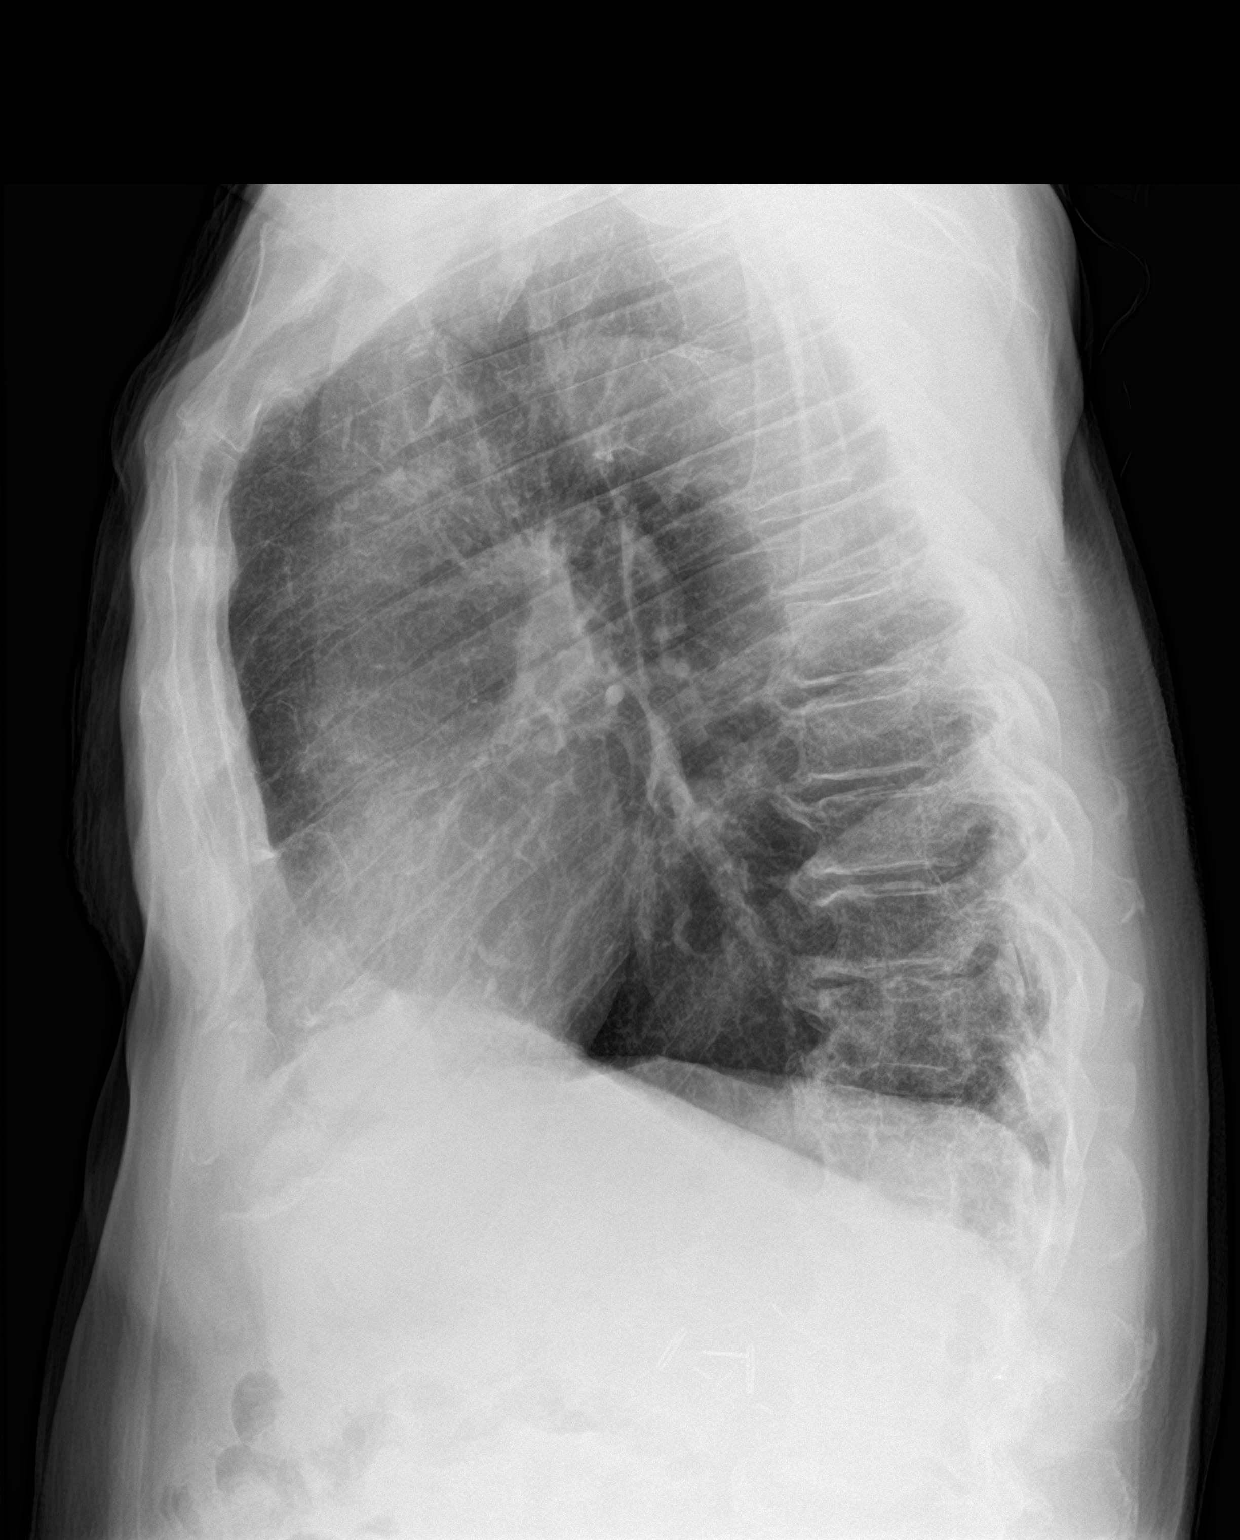

[2 of 2 positions shown; findings below may reference images not displayed]

FINDINGS: Chronic lung changes greater on the right stable.

No infiltrate, congestive heart failure or pneumothorax..

No plain film evidence of pulmonary malignancy.

Stable mild central pulmonary vascular prominence greater on the
right.

Heart size within normal limits.

Calcified slightly tortuous aorta.

Scoliosis lower thoracic spine convex right. No acute osseous
abnormality.
IMPRESSION: Stable chronic lung changes without acute cardiopulmonary
abnormality noted.

Aortic Atherosclerosis ([id number]).

## 2019-06-20 IMAGING — MR MR MRA HEAD W/O CM
10 of 12 series · 29 of 48 positions shown · non-contrast
Comparison: None.

CLINICAL DATA: [AGE]/o M; 10/22/2017 right temporal headache and
loss of vision.

EXAM:
MRI HEAD WITHOUT CONTRAST
MRI ORBITS WITHOUT CONTRAST
MRA WITHOUT CONTRAST
TECHNIQUE: Multiplanar, multiecho pulse sequences of the brain and surrounding
structures were obtained without intravenous contrast. Thin T2
coronal and axial sequences were obtained through the orbits.
Angiographic images of the head were obtained without intravenous
contrast. Patient declined to continue the examination.

[Series 3: DWI · axial · 3.0mm · 0.94mm/px · z∈[-57,+84]mm · 6 of 96 slices shown (1 of 2)]
[im 1/96]
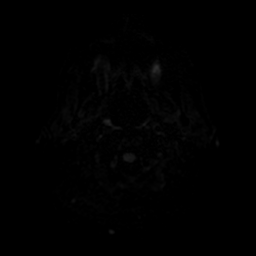
[im 20/96]
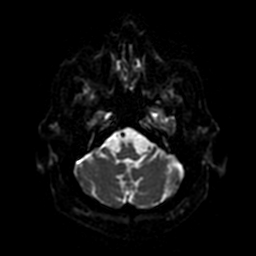
[im 39/96]
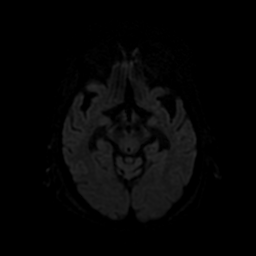
[im 58/96]
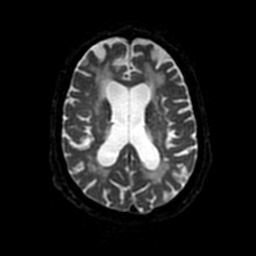
[im 77/96]
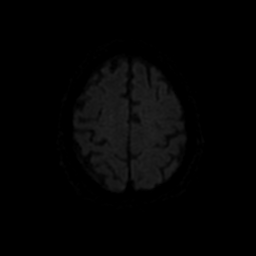
[im 96/96]
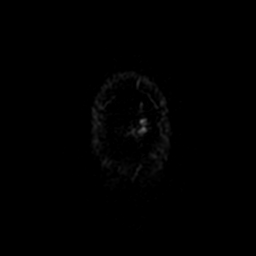

[Series 4: ax (id) 2 · axial · 1.0mm · 0.43mm/px · z∈[-34,+4]mm · 4 of 184 slices shown]
[im 1/184]
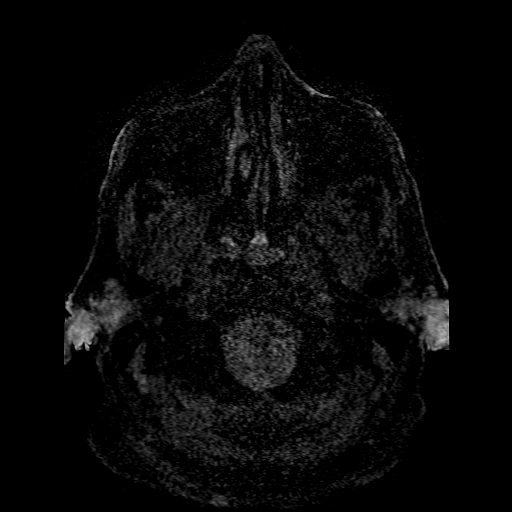
[im 31/184]
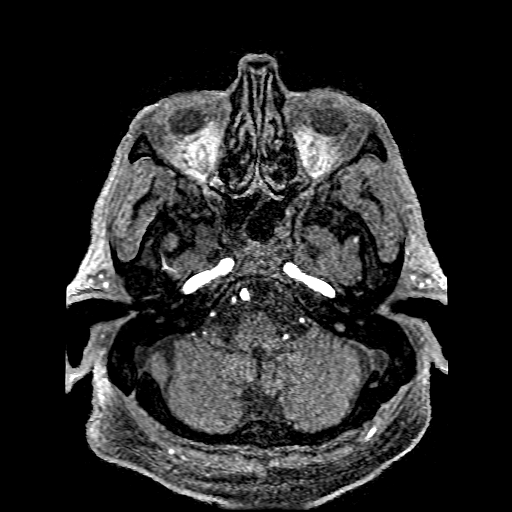
[im 62/184]
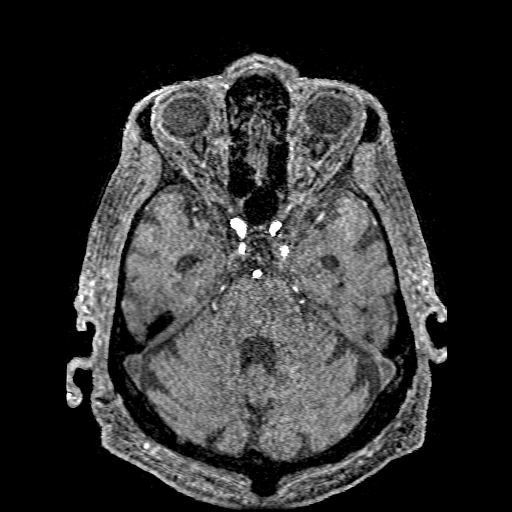
[im 77/184]
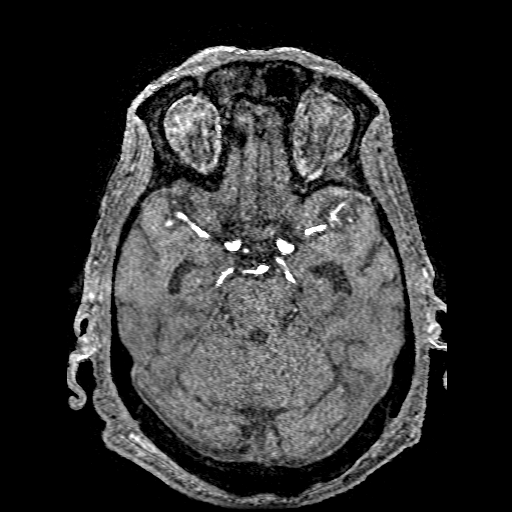

[Series 5: DWI · coronal · 4.0mm · 0.94mm/px · 5 of 72 slices shown (2 of 2)]
[im 1/72]
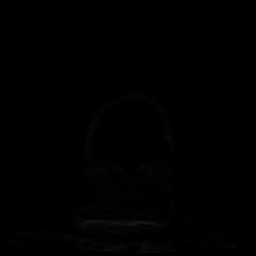
[im 18/72]
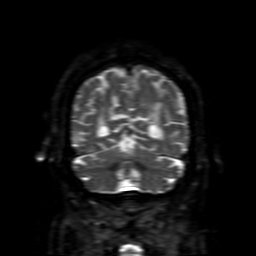
[im 36/72]
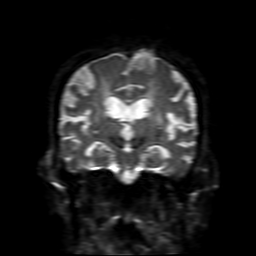
[im 54/72]
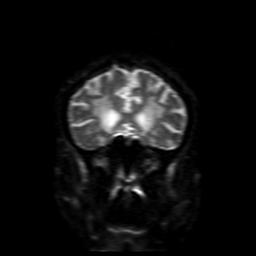
[im 72/72]
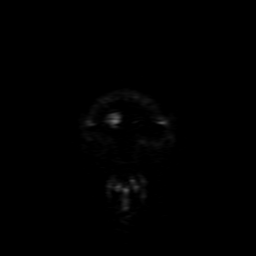

[Series 6: FLAIR · sagittal · 5.0mm · 0.47mm/px · 2 of 23 slices shown (1 of 2)]
[im 1/23]
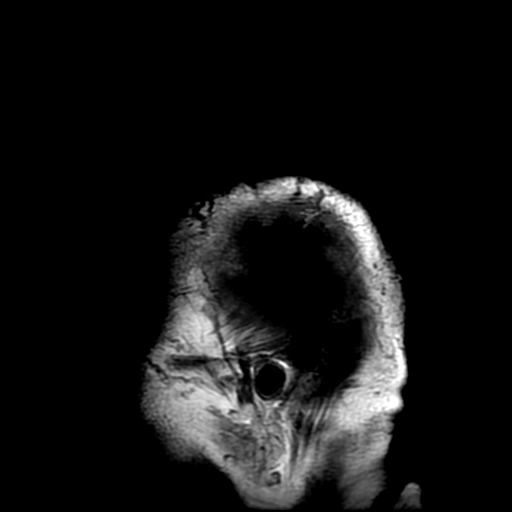
[im 23/23]
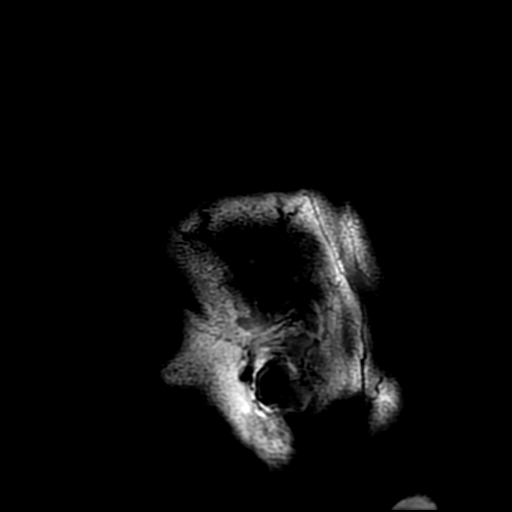

[Series 7: T2 · axial · 5.0mm · 0.43mm/px · z∈[-56,+82]mm · 2 of 24 slices shown]
[im 1/24]
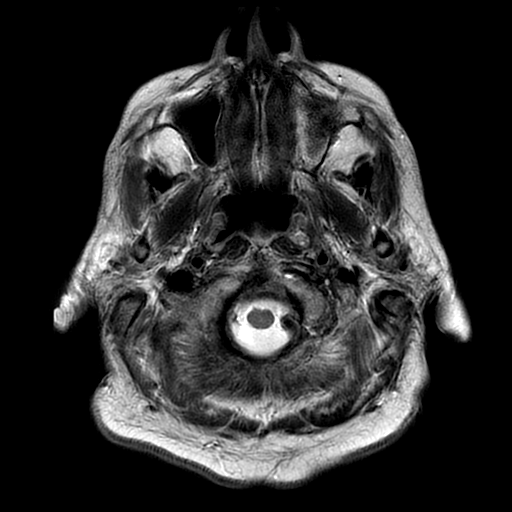
[im 24/24]
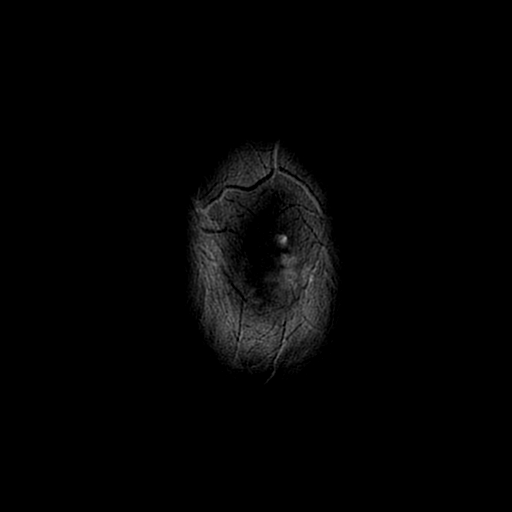

[Series 8: FLAIR · axial · 3.0mm · 0.43mm/px · z∈[-56,+82]mm · 2 of 24 slices shown (2 of 2)]
[im 1/24]
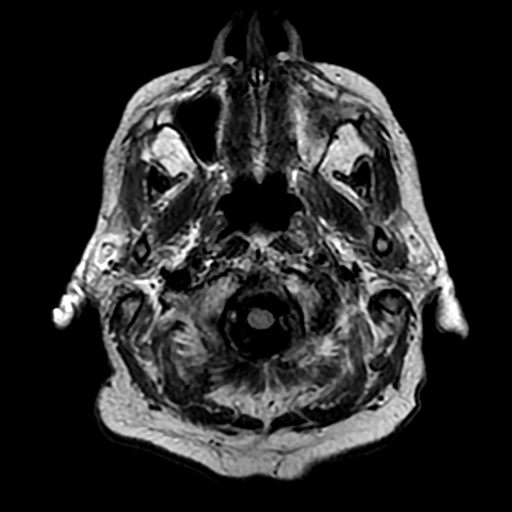
[im 24/24]
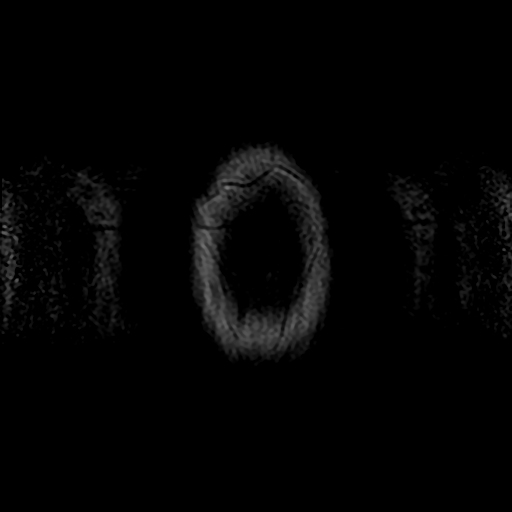

[Series 11: T2 fat-sat · coronal · 4.0mm · 0.35mm/px · 2 of 25 slices shown (1 of 2)]
[im 1/25]
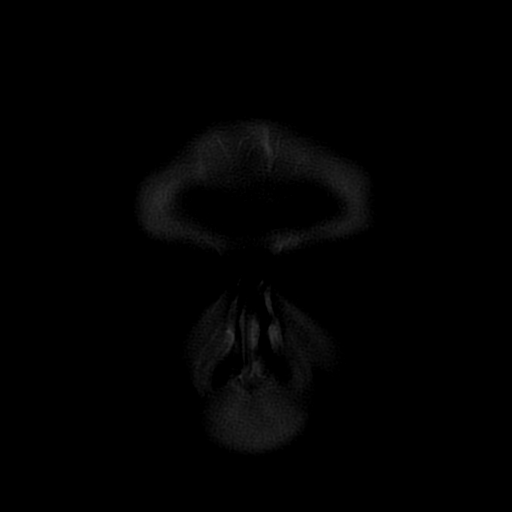
[im 25/25]
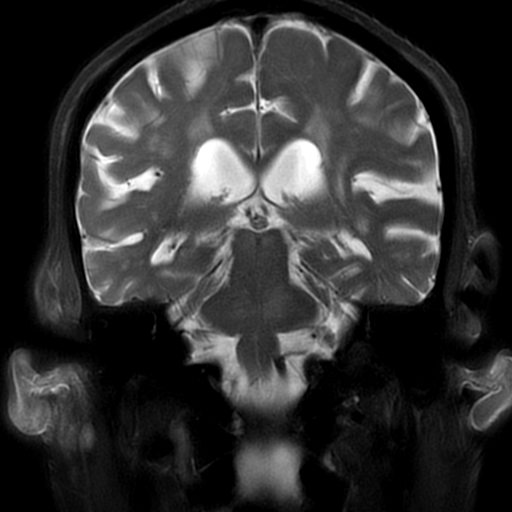

[Series 12: T2 fat-sat · axial · 3.0mm · 0.35mm/px · 1 of 20 slices shown (2 of 2)]
[im 1/20]
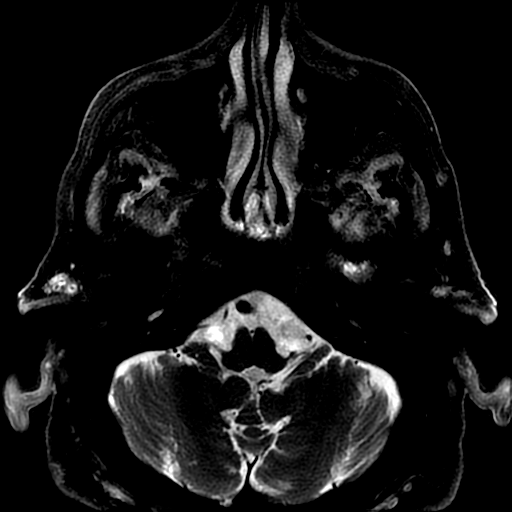

[Series 350: ADC · axial · 3.0mm · 0.94mm/px · z∈[-57,+84]mm · 3 of 48 slices shown (1 of 2)]
[im 1/48]
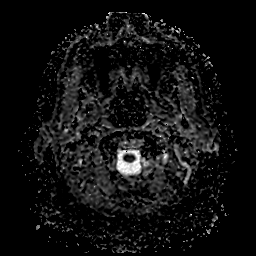
[im 24/48]
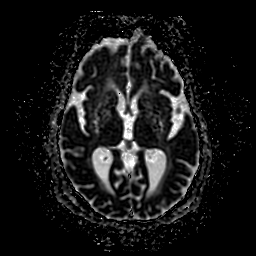
[im 48/48]
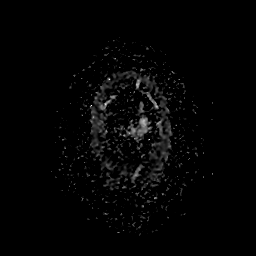

[Series 550: ADC · coronal · 4.0mm · 0.94mm/px · 2 of 36 slices shown (2 of 2)]
[im 1/36]
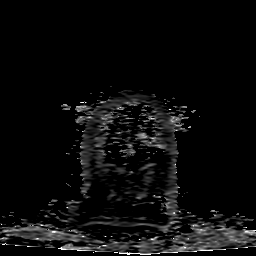
[im 36/36]
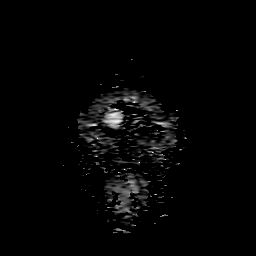

[29 of 48 positions shown; findings below may reference images not displayed]

FINDINGS: MRI HEAD FINDINGS

Brain: No acute infarction, hemorrhage, hydrocephalus, extra-axial
collection or mass lesion. Patchy confluentnonspecific foci of T2
FLAIR hyperintense signal abnormality in subcortical and
periventricular white matter are compatible withmoderate to
severechronic microvascular ischemic changes for age. Moderatebrain
parenchymal volume loss.

Vascular: Loss of normal right vertebral artery flow void.

Skull and upper cervical spine: Normal marrow signal.

Other: 13 mm Thornwaldt cyst.

MRI ORBITS FINDINGS

Orbits: Limited to thin axial and coronal T2 weighted sequences.
Bilateral intra-ocular lens replacement. Question punctate increased
T2 signal within the retrobulbar optic nerve, possible papilledema.
Punctate reduced diffusion similar location on MRI of the brain
(series 3, image 17 in series 12 image 10). No abnormal signal
within the orbital, canalicular, or cisternal segments of the optic
nerves identified. No abnormal signal of optic chiasm or radiations
identified.

Visualized sinuses: No significant abnormal signal.

Soft tissues: Negative.

Limited intracranial: As above.

MRA HEAD FINDINGS

Internal carotid arteries:  Patent.

Anterior cerebral arteries:  Patent.

Middle cerebral arteries: Patent.

Anterior communicating artery: Patent.

Posterior communicating arteries: Small right. Probable diminutive
left.

Posterior cerebral arteries:  Patent.

Basilar artery:  Patent.

Vertebral arteries: Right V4 segment occlusion, age indeterminate.
Patent dominant left vertebral artery.

No evidence of high-grade stenosis, large vessel occlusion, or
aneurysm unless noted above.
IMPRESSION: 1. Punctate increased T2 signal within right retrobulbar optic nerve
with reduced diffusion and possible papilledema. Findings probably
represent acute anterior ischemic optic neuropathy. A longer segment
of signal abnormality would be more typical for infectious or
inflammatory causes considered less likely. No definite signal
abnormality of the left optic nerve.
2. No acute abnormality of brain parenchyma.
3. Moderate to severe chronic microvascular ischemic changes and
moderate parenchymal volume loss of the brain.
4. Age indeterminate right intracranial vertebral artery occlusion.
Otherwise negative MRA of the head.

These results will be called to the ordering clinician or
representative by the Radiologist Assistant, and communication
documented in the PACS or zVision Dashboard.

By: Main Marlin M.D.
